# Patient Record
Sex: Male | Born: 1950 | Race: White | Hispanic: No | Smoking: Former smoker
Health system: Southern US, Community
[De-identification: ages and names within clinical notes are randomized; demographics above are authoritative.]

## PROBLEM LIST (undated history)

## (undated) DIAGNOSIS — I509 Heart failure, unspecified: Secondary | ICD-10-CM

## (undated) DIAGNOSIS — E119 Type 2 diabetes mellitus without complications: Secondary | ICD-10-CM

## (undated) DIAGNOSIS — E1165 Type 2 diabetes mellitus with hyperglycemia: Secondary | ICD-10-CM

## (undated) DIAGNOSIS — F419 Anxiety disorder, unspecified: Secondary | ICD-10-CM

## (undated) DIAGNOSIS — F329 Major depressive disorder, single episode, unspecified: Secondary | ICD-10-CM

## (undated) DIAGNOSIS — E669 Obesity, unspecified: Secondary | ICD-10-CM

## (undated) DIAGNOSIS — E785 Hyperlipidemia, unspecified: Secondary | ICD-10-CM

## (undated) DIAGNOSIS — F32A Depression, unspecified: Secondary | ICD-10-CM

## (undated) DIAGNOSIS — M869 Osteomyelitis, unspecified: Secondary | ICD-10-CM

## (undated) DIAGNOSIS — I2510 Atherosclerotic heart disease of native coronary artery without angina pectoris: Secondary | ICD-10-CM

## (undated) DIAGNOSIS — I639 Cerebral infarction, unspecified: Secondary | ICD-10-CM

## (undated) DIAGNOSIS — I5022 Chronic systolic (congestive) heart failure: Secondary | ICD-10-CM

## (undated) DIAGNOSIS — I739 Peripheral vascular disease, unspecified: Secondary | ICD-10-CM

## (undated) DIAGNOSIS — J9610 Chronic respiratory failure, unspecified whether with hypoxia or hypercapnia: Secondary | ICD-10-CM

## (undated) DIAGNOSIS — IMO0002 Reserved for concepts with insufficient information to code with codable children: Secondary | ICD-10-CM

## (undated) DIAGNOSIS — M1990 Unspecified osteoarthritis, unspecified site: Secondary | ICD-10-CM

## (undated) DIAGNOSIS — I255 Ischemic cardiomyopathy: Secondary | ICD-10-CM

## (undated) DIAGNOSIS — J449 Chronic obstructive pulmonary disease, unspecified: Secondary | ICD-10-CM

## (undated) DIAGNOSIS — I10 Essential (primary) hypertension: Secondary | ICD-10-CM

## (undated) HISTORY — DX: Osteomyelitis, unspecified: M86.9

## (undated) HISTORY — PX: EYE SURGERY: SHX253

## (undated) HISTORY — DX: Hyperlipidemia, unspecified: E78.5

## (undated) HISTORY — DX: Peripheral vascular disease, unspecified: I73.9

## (undated) HISTORY — DX: Essential (primary) hypertension: I10

## (undated) HISTORY — DX: Obesity, unspecified: E66.9

## (undated) HISTORY — PX: CHOLECYSTECTOMY: SHX55

## (undated) HISTORY — PX: JOINT REPLACEMENT: SHX530

## (undated) HISTORY — PX: CORONARY ARTERY BYPASS GRAFT: SHX141

---

## 2006-04-08 HISTORY — PX: CORONARY STENT PLACEMENT: SHX1402

## 2008-04-04 ENCOUNTER — Ambulatory Visit: Payer: Self-pay | Admitting: Cardiology

## 2010-07-26 ENCOUNTER — Inpatient Hospital Stay (HOSPITAL_COMMUNITY)
Admission: EM | Admit: 2010-07-26 | Discharge: 2010-08-03 | DRG: 189 | Disposition: A | Discharge status: Home / Self Care | Payer: Medicare PPO | Source: Other Acute Inpatient Hospital | Attending: Pulmonary Disease | Admitting: Pulmonary Disease

## 2010-07-26 ENCOUNTER — Inpatient Hospital Stay (HOSPITAL_COMMUNITY): Payer: Medicare PPO

## 2010-07-26 DIAGNOSIS — Z7982 Long term (current) use of aspirin: Secondary | ICD-10-CM

## 2010-07-26 DIAGNOSIS — R0902 Hypoxemia: Secondary | ICD-10-CM | POA: Diagnosis present

## 2010-07-26 DIAGNOSIS — J441 Chronic obstructive pulmonary disease with (acute) exacerbation: Secondary | ICD-10-CM | POA: Diagnosis present

## 2010-07-26 DIAGNOSIS — IMO0002 Reserved for concepts with insufficient information to code with codable children: Secondary | ICD-10-CM

## 2010-07-26 DIAGNOSIS — J9600 Acute respiratory failure, unspecified whether with hypoxia or hypercapnia: Secondary | ICD-10-CM

## 2010-07-26 DIAGNOSIS — J449 Chronic obstructive pulmonary disease, unspecified: Secondary | ICD-10-CM

## 2010-07-26 DIAGNOSIS — N179 Acute kidney failure, unspecified: Secondary | ICD-10-CM | POA: Diagnosis present

## 2010-07-26 DIAGNOSIS — I10 Essential (primary) hypertension: Secondary | ICD-10-CM | POA: Diagnosis present

## 2010-07-26 DIAGNOSIS — G4733 Obstructive sleep apnea (adult) (pediatric): Secondary | ICD-10-CM | POA: Diagnosis present

## 2010-07-26 DIAGNOSIS — I498 Other specified cardiac arrhythmias: Secondary | ICD-10-CM | POA: Diagnosis present

## 2010-07-26 DIAGNOSIS — E871 Hypo-osmolality and hyponatremia: Secondary | ICD-10-CM | POA: Diagnosis not present

## 2010-07-26 DIAGNOSIS — R0602 Shortness of breath: Secondary | ICD-10-CM

## 2010-07-26 DIAGNOSIS — Z79899 Other long term (current) drug therapy: Secondary | ICD-10-CM

## 2010-07-26 DIAGNOSIS — K5900 Constipation, unspecified: Secondary | ICD-10-CM | POA: Diagnosis not present

## 2010-07-26 DIAGNOSIS — I2510 Atherosclerotic heart disease of native coronary artery without angina pectoris: Secondary | ICD-10-CM | POA: Diagnosis present

## 2010-07-26 DIAGNOSIS — J9620 Acute and chronic respiratory failure, unspecified whether with hypoxia or hypercapnia: Principal | ICD-10-CM | POA: Diagnosis present

## 2010-07-26 DIAGNOSIS — J8410 Pulmonary fibrosis, unspecified: Secondary | ICD-10-CM | POA: Diagnosis present

## 2010-07-26 DIAGNOSIS — E669 Obesity, unspecified: Secondary | ICD-10-CM | POA: Diagnosis present

## 2010-07-26 DIAGNOSIS — Z794 Long term (current) use of insulin: Secondary | ICD-10-CM

## 2010-07-26 DIAGNOSIS — J189 Pneumonia, unspecified organism: Secondary | ICD-10-CM | POA: Diagnosis present

## 2010-07-26 DIAGNOSIS — Z951 Presence of aortocoronary bypass graft: Secondary | ICD-10-CM

## 2010-07-26 DIAGNOSIS — F40298 Other specified phobia: Secondary | ICD-10-CM | POA: Diagnosis present

## 2010-07-26 DIAGNOSIS — E876 Hypokalemia: Secondary | ICD-10-CM | POA: Diagnosis not present

## 2010-07-26 DIAGNOSIS — J13 Pneumonia due to Streptococcus pneumoniae: Secondary | ICD-10-CM

## 2010-07-26 DIAGNOSIS — E1169 Type 2 diabetes mellitus with other specified complication: Secondary | ICD-10-CM | POA: Diagnosis present

## 2010-07-26 LAB — GLUCOSE, CAPILLARY
Glucose-Capillary: 271 mg/dL — ABNORMAL HIGH (ref 70–99)
Glucose-Capillary: 265 mg/dL — ABNORMAL HIGH (ref 70–99)

## 2010-07-26 LAB — COMPREHENSIVE METABOLIC PANEL
ALT: 10 U/L (ref 0–53)
AST: 12 U/L (ref 0–37)
Albumin: 2.6 g/dL — ABNORMAL LOW (ref 3.5–5.2)
CO2: 27 mEq/L (ref 19–32)
Chloride: 96 mEq/L (ref 96–112)
GFR calc Af Amer: >60 mL/min (ref >60)
GFR calc non Af Amer: >60 mL/min (ref >60)
Sodium: 132 mEq/L — ABNORMAL LOW (ref 135–145)
Total Bilirubin: 0.7 mg/dL (ref 0.3–1.2)

## 2010-07-26 LAB — POCT I-STAT 3, ART BLOOD GAS (G3+)
Bicarbonate: 26.8 mEq/L — ABNORMAL HIGH (ref 20.0–24.0)
pCO2 arterial: 42.8 mmHg (ref 35.0–45.0)
pH, Arterial: 7.404 (ref 7.350–7.450)
pO2, Arterial: 94 mmHg (ref 80.0–100.0)

## 2010-07-26 LAB — SODIUM, URINE, RANDOM: Sodium, Ur: 92 mEq/L

## 2010-07-26 LAB — TSH: TSH: 1.13 u[IU]/mL (ref 0.350–4.500)

## 2010-07-26 LAB — CBC
HCT: 31.2 % — ABNORMAL LOW (ref 39.0–52.0)
Hemoglobin: 10.3 g/dL — ABNORMAL LOW (ref 13.0–17.0)
RBC: 3.63 MIL/uL — ABNORMAL LOW (ref 4.22–5.81)
WBC: 11.5 10*3/uL — ABNORMAL HIGH (ref 4.0–10.5)

## 2010-07-26 LAB — MRSA PCR SCREENING: MRSA by PCR: NEGATIVE

## 2010-07-27 ENCOUNTER — Inpatient Hospital Stay (HOSPITAL_COMMUNITY): Payer: Medicare PPO

## 2010-07-27 DIAGNOSIS — I517 Cardiomegaly: Secondary | ICD-10-CM

## 2010-07-27 LAB — GLUCOSE, CAPILLARY
Glucose-Capillary: 232 mg/dL — ABNORMAL HIGH (ref 70–99)
Glucose-Capillary: 290 mg/dL — ABNORMAL HIGH (ref 70–99)
Glucose-Capillary: 333 mg/dL — ABNORMAL HIGH (ref 70–99)
Glucose-Capillary: 373 mg/dL — ABNORMAL HIGH (ref 70–99)

## 2010-07-27 LAB — PROCALCITONIN: Procalcitonin: <0.1 ng/mL

## 2010-07-27 LAB — BASIC METABOLIC PANEL
BUN: 12 mg/dL (ref 6–23)
CO2: 25 mEq/L (ref 19–32)
Chloride: 97 mEq/L (ref 96–112)
GFR calc non Af Amer: >60 mL/min (ref >60)
Glucose, Bld: 319 mg/dL — ABNORMAL HIGH (ref 70–99)
Potassium: 3.8 mEq/L (ref 3.5–5.1)
Sodium: 133 mEq/L — ABNORMAL LOW (ref 135–145)

## 2010-07-27 LAB — CBC
HCT: 33.2 % — ABNORMAL LOW (ref 39.0–52.0)
Hemoglobin: 11.1 g/dL — ABNORMAL LOW (ref 13.0–17.0)
MCV: 85.8 fL (ref 78.0–100.0)
RDW: 14.1 % (ref 11.5–15.5)
WBC: 10.5 10*3/uL (ref 4.0–10.5)

## 2010-07-27 LAB — URINALYSIS, ROUTINE W REFLEX MICROSCOPIC
Bilirubin Urine: NEGATIVE
Protein, ur: 30 mg/dL — AB
Urobilinogen, UA: 0.2 mg/dL (ref 0.0–1.0)

## 2010-07-27 LAB — URINE MICROSCOPIC-ADD ON

## 2010-07-27 LAB — C3 COMPLEMENT: C3 Complement: 185 mg/dL (ref 88–201)

## 2010-07-27 LAB — CARDIAC PANEL(CRET KIN+CKTOT+MB+TROPI): Troponin I: 0.03 ng/mL (ref 0.00–0.06)

## 2010-07-28 ENCOUNTER — Inpatient Hospital Stay (HOSPITAL_COMMUNITY): Payer: Medicare PPO

## 2010-07-28 DIAGNOSIS — J8410 Pulmonary fibrosis, unspecified: Secondary | ICD-10-CM

## 2010-07-28 LAB — RHEUMATOID FACTOR: Rhuematoid fact SerPl-aCnc: 17 IU/mL — ABNORMAL HIGH (ref <=14)

## 2010-07-28 LAB — BASIC METABOLIC PANEL
Calcium: 9 mg/dL (ref 8.4–10.5)
Creatinine, Ser: 1.14 mg/dL (ref 0.4–1.5)
GFR calc Af Amer: >60 mL/min (ref >60)
GFR calc non Af Amer: >60 mL/min (ref >60)

## 2010-07-28 LAB — URINE CULTURE: Colony Count: NO GROWTH

## 2010-07-28 LAB — COMPLEMENT, TOTAL: Compl, Total (CH50): >60 U/mL — ABNORMAL HIGH (ref 31–60)

## 2010-07-28 LAB — MAGNESIUM: Magnesium: 1.6 mg/dL (ref 1.5–2.5)

## 2010-07-28 LAB — CBC
MCH: 28.3 pg (ref 26.0–34.0)
MCHC: 33.2 g/dL (ref 30.0–36.0)
Platelets: 362 10*3/uL (ref 150–400)

## 2010-07-28 LAB — PHOSPHORUS: Phosphorus: 3.1 mg/dL (ref 2.3–4.6)

## 2010-07-28 LAB — GLUCOSE, CAPILLARY: Glucose-Capillary: 342 mg/dL — ABNORMAL HIGH (ref 70–99)

## 2010-07-29 ENCOUNTER — Inpatient Hospital Stay (HOSPITAL_COMMUNITY): Payer: Medicare PPO

## 2010-07-29 DIAGNOSIS — J13 Pneumonia due to Streptococcus pneumoniae: Secondary | ICD-10-CM

## 2010-07-29 DIAGNOSIS — J9600 Acute respiratory failure, unspecified whether with hypoxia or hypercapnia: Secondary | ICD-10-CM

## 2010-07-29 DIAGNOSIS — J8410 Pulmonary fibrosis, unspecified: Secondary | ICD-10-CM

## 2010-07-29 LAB — GLUCOSE, CAPILLARY: Glucose-Capillary: 232 mg/dL — ABNORMAL HIGH (ref 70–99)

## 2010-07-29 LAB — BASIC METABOLIC PANEL
BUN: 27 mg/dL — ABNORMAL HIGH (ref 6–23)
Calcium: 8.8 mg/dL (ref 8.4–10.5)
Creatinine, Ser: 1.17 mg/dL (ref 0.4–1.5)
GFR calc non Af Amer: >60 mL/min (ref >60)
Glucose, Bld: 252 mg/dL — ABNORMAL HIGH (ref 70–99)
Potassium: 4 mEq/L (ref 3.5–5.1)

## 2010-07-29 LAB — CBC
HCT: 32.1 % — ABNORMAL LOW (ref 39.0–52.0)
MCH: 28.2 pg (ref 26.0–34.0)
MCHC: 33 g/dL (ref 30.0–36.0)
MCV: 85.4 fL (ref 78.0–100.0)
Platelets: 399 10*3/uL (ref 150–400)
RDW: 13.9 % (ref 11.5–15.5)
WBC: 15.5 10*3/uL — ABNORMAL HIGH (ref 4.0–10.5)

## 2010-07-29 LAB — MAGNESIUM: Magnesium: 1.8 mg/dL (ref 1.5–2.5)

## 2010-07-30 ENCOUNTER — Inpatient Hospital Stay (HOSPITAL_COMMUNITY): Payer: Medicare PPO

## 2010-07-30 DIAGNOSIS — J449 Chronic obstructive pulmonary disease, unspecified: Secondary | ICD-10-CM

## 2010-07-30 LAB — SJOGRENS SYNDROME-B EXTRACTABLE NUCLEAR ANTIBODY: SSB (La) (ENA) Antibody, IgG: <1 AU/mL (ref <30)

## 2010-07-30 LAB — CYCLIC CITRUL PEPTIDE ANTIBODY, IGG: Cyclic Citrullin Peptide Ab: <2 U/mL (ref 0.0–5.0)

## 2010-07-30 LAB — GLUCOSE, CAPILLARY
Glucose-Capillary: 249 mg/dL — ABNORMAL HIGH (ref 70–99)
Glucose-Capillary: 269 mg/dL — ABNORMAL HIGH (ref 70–99)
Glucose-Capillary: 284 mg/dL — ABNORMAL HIGH (ref 70–99)
Glucose-Capillary: 288 mg/dL — ABNORMAL HIGH (ref 70–99)

## 2010-07-30 LAB — CBC
MCH: 28.6 pg (ref 26.0–34.0)
MCHC: 33.7 g/dL (ref 30.0–36.0)
MCV: 84.9 fL (ref 78.0–100.0)
Platelets: 409 10*3/uL — ABNORMAL HIGH (ref 150–400)
RBC: 3.91 MIL/uL — ABNORMAL LOW (ref 4.22–5.81)

## 2010-07-30 LAB — BASIC METABOLIC PANEL
BUN: 29 mg/dL — ABNORMAL HIGH (ref 6–23)
CO2: 30 mEq/L (ref 19–32)
Calcium: 9 mg/dL (ref 8.4–10.5)
Chloride: 96 mEq/L (ref 96–112)
Creatinine, Ser: 1.46 mg/dL (ref 0.4–1.5)
GFR calc Af Amer: 59 mL/min — ABNORMAL LOW (ref >60)

## 2010-07-30 LAB — PHOSPHORUS: Phosphorus: 3.7 mg/dL (ref 2.3–4.6)

## 2010-07-30 LAB — MAGNESIUM: Magnesium: 2.3 mg/dL (ref 1.5–2.5)

## 2010-07-30 LAB — ANTI-NEUTROPHIL ANTIBODY

## 2010-07-31 ENCOUNTER — Inpatient Hospital Stay (HOSPITAL_COMMUNITY): Payer: Medicare PPO

## 2010-07-31 LAB — GLUCOSE, CAPILLARY: Glucose-Capillary: 360 mg/dL — ABNORMAL HIGH (ref 70–99)

## 2010-07-31 LAB — BASIC METABOLIC PANEL
GFR calc non Af Amer: 52 mL/min — ABNORMAL LOW (ref >60)
Glucose, Bld: 291 mg/dL — ABNORMAL HIGH (ref 70–99)
Potassium: 4.1 mEq/L (ref 3.5–5.1)
Sodium: 138 mEq/L (ref 135–145)

## 2010-07-31 LAB — BRAIN NATRIURETIC PEPTIDE: Pro B Natriuretic peptide (BNP): 189 pg/mL — ABNORMAL HIGH (ref 0.0–100.0)

## 2010-07-31 LAB — PROCALCITONIN: Procalcitonin: <0.1 ng/mL

## 2010-07-31 LAB — MAGNESIUM: Magnesium: 2 mg/dL (ref 1.5–2.5)

## 2010-08-01 ENCOUNTER — Inpatient Hospital Stay (HOSPITAL_COMMUNITY): Payer: Medicare PPO

## 2010-08-01 LAB — CBC
Hemoglobin: 11.6 g/dL — ABNORMAL LOW (ref 13.0–17.0)
MCH: 27.5 pg (ref 26.0–34.0)
MCV: 85.3 fL (ref 78.0–100.0)
RBC: 4.22 MIL/uL (ref 4.22–5.81)

## 2010-08-01 LAB — BASIC METABOLIC PANEL
BUN: 36 mg/dL — ABNORMAL HIGH (ref 6–23)
CO2: 31 mEq/L (ref 19–32)
Chloride: 95 mEq/L — ABNORMAL LOW (ref 96–112)
Creatinine, Ser: 1.52 mg/dL — ABNORMAL HIGH (ref 0.4–1.5)

## 2010-08-01 LAB — GLUCOSE, CAPILLARY
Glucose-Capillary: 94 mg/dL (ref 70–99)
Glucose-Capillary: 137 mg/dL — ABNORMAL HIGH (ref 70–99)
Glucose-Capillary: 149 mg/dL — ABNORMAL HIGH (ref 70–99)

## 2010-08-02 LAB — BASIC METABOLIC PANEL
Calcium: 9.7 mg/dL (ref 8.4–10.5)
GFR calc Af Amer: 59 mL/min — ABNORMAL LOW (ref >60)
GFR calc non Af Amer: 49 mL/min — ABNORMAL LOW (ref >60)
Glucose, Bld: 63 mg/dL — ABNORMAL LOW (ref 70–99)
Potassium: 3.6 mEq/L (ref 3.5–5.1)
Sodium: 144 mEq/L (ref 135–145)

## 2010-08-02 LAB — CULTURE, BLOOD (ROUTINE X 2)
Culture  Setup Time: 201204201629
Culture: NO GROWTH

## 2010-08-02 LAB — GLUCOSE, CAPILLARY
Glucose-Capillary: 61 mg/dL — ABNORMAL LOW (ref 70–99)
Glucose-Capillary: 91 mg/dL (ref 70–99)
Glucose-Capillary: 165 mg/dL — ABNORMAL HIGH (ref 70–99)
Glucose-Capillary: 188 mg/dL — ABNORMAL HIGH (ref 70–99)

## 2010-08-02 LAB — PHOSPHORUS: Phosphorus: 5.4 mg/dL — ABNORMAL HIGH (ref 2.3–4.6)

## 2010-08-02 LAB — MAGNESIUM: Magnesium: 1.9 mg/dL (ref 1.5–2.5)

## 2010-08-03 LAB — BASIC METABOLIC PANEL
Chloride: 98 mEq/L (ref 96–112)
GFR calc non Af Amer: 49 mL/min — ABNORMAL LOW (ref >60)
Glucose, Bld: 142 mg/dL — ABNORMAL HIGH (ref 70–99)
Potassium: 3.8 mEq/L (ref 3.5–5.1)
Sodium: 140 mEq/L (ref 135–145)

## 2010-08-03 LAB — GLUCOSE, CAPILLARY
Glucose-Capillary: 79 mg/dL (ref 70–99)
Glucose-Capillary: 125 mg/dL — ABNORMAL HIGH (ref 70–99)

## 2010-08-03 LAB — CBC
HCT: 38.4 % — ABNORMAL LOW (ref 39.0–52.0)
MCV: 87.7 fL (ref 78.0–100.0)
Platelets: 494 10*3/uL — ABNORMAL HIGH (ref 150–400)
RBC: 4.38 MIL/uL (ref 4.22–5.81)
RDW: 14.9 % (ref 11.5–15.5)
WBC: 14 10*3/uL — ABNORMAL HIGH (ref 4.0–10.5)

## 2010-08-10 NOTE — Discharge Summary (Addendum)
NAME:  Timothy Trujillo, Timothy Trujillo NO.:  [id number]  MEDICAL RECORD NO.:  [id number]           PATIENT TYPE:  LOCATION:                                 FACILITY:  PHYSICIAN:  Timothy Evener, MD  DATE OF BIRTH:  Jul 29, 1950  DATE OF ADMISSION: DATE OF DISCHARGE:                              DISCHARGE SUMMARY   DISCHARGE DIAGNOSES: 1. Acute-on-chronic respiratory failure secondary to interstitial lung     disease, acute exacerbation of chronic obstructive pulmonary     disease with underlying obstructive sleep apnea and associated     hypoxemia. 2. Acute renal failure. 3. Hyperglycemia/diabetes mellitus. 4. History of hypertension. 5. Constipation.  LINES/TUBES:  Two peripheral lines which were started on July 26, 2010.  MICRO DATA: 1. On July 27, 2010, urine culture demonstrates no growth. 2. On July 27, 2010, blood cultures x2 demonstrate no growth. 3. Procalcitonin on July 27, 2010, is less than 0.1. 4. On July 26, 2010, HIV is negative. 5. On July 26, 2010, MRSA PCR is negative.  LABORATORY DATA: 1. On July 26, 2010, ESR is 103. 2. On July 26, 2010, TSH is 1.13. 3. On July 26, 2010, complement C3 is 185, complement C4 is 31 4. On July 26, 2010, C-reactive protein is 227. 5. On July 26, 2010, ANA which is negative. 6. On July 26, 2010, anti-DNA which is 2. 7. On July 28, 2010, C-reactive protein at 14.7. 8. Complement CH50 which is greater than 60. 9. On July 28, 2010, rheumatoid factor which is 17. 10.On July 28, 2010, citrullinated antibody IgG which is less than 2. 11.Sjogren syndrome SSA antibody IgG is less than 1, SSB antibody IgG     is less than 1.  ANTIBIOTIC DATA: 1. The patient was placed on vancomycin from July 26, 2010, to July 29, 2010. 2. Ceftazidime from July 26, 2010, to July 30, 2010. 3. Azithromycin from July 26, 2010, to July 30, 2010.  KEY EVENTS/STUDIES: 1. On July 25, 2010, chest x-ray demonstrates  worsening right greater     than left airspace disease with questionable multifocal pneumonia     from [facility]. 2. On July 26, 2010, echocardiogram demonstrates four-vessel     visualization of LV and RV, unable to evaluate function, PA     pressure of 42 mmHg.  HISTORY OF PRESENT ILLNESS:  [name] is a 60 year old male with past medical history of hypertension, COPD, coronary artery bypass grafting, diabetes mellitus type 2 with recent pneumonia and was treated on March he had with a recent pneumonia.  He was admitted from July 07, 2010, at [facility] and treated with Levaquin and subsequently discharged on July 16, 2010, and completed antibiotics on July 21, 2010.  He was readmitted to [facility] on July 25, 2010, for shortness of breath for 3 days prior to presentation and found to have worsening right greater than left pneumonia on chest x-ray.  At that time, he was noted to have oxygen saturations in the 90s on room air and sinus tachycardia in the 120s.  He was transferred to  [facility] for further evaluation and treatment.  Upon admission, he was placed on BiPAP therapy with cycling 4 hours on and 2 hours off.  High-dose steroids and aggressive nebulization were instituted.  Autoimmune panel was sent for evaluation.  Upon admission, he did indicate that he would not want ACLS shock, CPR, or intubation for cardiac arrest.  He would only want intubation for progressive respiratory failure.  He was treated with Lasix to achieve a negative balance.  He was placed on azithromycin, vancomycin, and ceftazidime.  HIV antibody was tested and negative.  [name] does have a known history of diabetes mellitus and was placed on sliding scale insulin.  He also has a known history of hypertension and is on a significant home regimen which was held upon admission.  A 2-D echocardiogram was evaluated on July 27, 2010, which demonstrated findings as above.  He  did remain on cyclic BiPAP therapy until July 29, 2010, at which time he was transitioned to nocturnal BiPAP only, and also was transferred to step-down on July 29, 2010.  Antibiotics were narrowed as the patient improved and culture data returned.  [name] does have bilateral infiltrates of unclear etiology and felt that pneumonia was unlikely secondary to a negative procalcitonin.  It was felt that it was likely related to edema versus acute lung injury after a recent pneumonia versus autoimmune process.  TTE was completed during hospitalization and was a poor study on July 27, 2010.  BNP was only mildly elevated and infiltrates were improved without significant diuresis.  [name] did not require intubation during hospitalization. If required intubation, we would recommend fiberoptic bronchoscopy for possible VATS if suspicion continues for inflammatory process.  He was continued during hospitalization on diuresis as renal function permitted.  He did have a mild increase in his serum creatinine and Lasix was decreased with subsequent decrease in serum creatinine.  At time of dictation, it was felt that [name] does need a sleep study and follow up with outpatient Pulmonary/Sleep Medicine.  HOSPITAL COURSE BY DISCHARGE DIAGNOSES: 1. Acute-on-chronic respiratory failure secondary to interstitial lung     disease, acute exacerbation of COPD with underlying obstructive     sleep apnea and associated hypoxemia.  As per HPI, [name] was     admitted to [facility] on July 26, 2010, as a transfer from     [facility].  He recently had been admitted to [facility] from July 07, 2010, until July 16, 2010, with treatment     for pneumonia with Levaquin.  He completed Levaquin on July 20, 2012.  Unfortunately, he did require readmission to [facility] on     July 25, 2010, for 3 days of shortness of breath and was noted to     have worsening infiltrates on chest  x-ray with oxygen saturations     of 90% on room air.  He was transferred to [facility] on July 26, 2010, for further evaluation and workup.  He was admitted to the     intensive care unit and did require approximately 48 hours of     cyclic BiPAP therapy.  He was placed on high-dose steroids and     diuresed.  He was placed on IV antibiotics.  However, at this time     it is felt that there was no evidence for acute infection and more     likely infiltrates  or edema.  However, he did not significantly     respond to diuresis.  Other consideration was acute lung injury.     Procalcitonin was negative during hospital course, and antibiotic     therapy was narrowed and stopped as cultures returned.  Autoimmune     panel was sent for evaluation.  Please see above for details.  If     further concerns were inflammatory process, could consider     fiberoptic bronchoscopy/VATS in the future.  At time of discharge,     Mr. Andreoni has been set up for an outpatient sleep study with     followup with sleep/pulmonary MD.  Please see below for details.     It is recommended at time of discharge that he continue on at least     nocturnal CPAP until followup with Dr. Craige Cotta in the outpatient     setting. 2. Acute renal failure.  Mr. Vickerman was aggressively diuresed in the     setting of acute bilateral pulmonary infiltrates.  He did not have     a significant response to diuresis during hospital course.  Lasix     therapy was scaled back in the setting of rising serum creatinine.     Prior to admission, he was taking Lasix as needed secondary to     swelling.  At time of discharge, he was on 40 mg daily and will be     discharged on previous home dose of 1-2 tablets of 20 mg Lasix as     needed. 3. Hyperglycemia, secondary to steroid use in the setting of diabetes     mellitus.  During hospital course, Mr. Toon was placed on sliding     scale insulin to achieve normal glucose control.  He will  be     discharged on prednisone therapy with a taper.  As prednisone is     tapered, it is expected that his blood sugars should normalize.  It     is recommended that he have a followup BMP with his primary care     provider as below. 4. Constipation.  Mr. Mcphearson was given Senokot-S during hospital     course for constipation.  No further followup needed at this time. 5. History of hypertension.  Initially, during hospital admission, Mr.     Pianka home medications were held.  Currently, prior to admission     he was on metoprolol 200 mg XL one tablet twice daily as well as     Imdur 30 mg XR one tablet daily and amlodipine 5 mg 1 tablet daily.     He also was on dipyridamole 75 mg 1 tablet 3 times daily.  At time     of dictation, he had not been resumed on any home medications for     hypertension and blood pressures were ranging within 110s-130s     systolic.  He will be resumed at time of dictation on amlodipine     and Lopressor.  These will be resumed at significantly reduced dose     from prior home medication dosing.  This will need to be followed     up on with his primary care provider.  DISCHARGE INSTRUCTIONS: 1. Activity:  Mr. Olivencia is instructed to increase activity slowly and     as tolerated. 2. Diet:  Diabetic diet. 3. Followup:  He is scheduled to follow up with Dr. Craige Cotta on Sep 04, 2010,  at 10 a.m. and he is scheduled to have sleep study on the     evening of Aug 26, 2010.  Pulmonary office will mail to Mr. Sculley     further instructions regarding sleep study.  He also will be     scheduled to follow up with his primary care provider, Dr. Daryel November.  DISCHARGE MEDICATIONS:  To be dictated at time of discharge.  DISPOSITION AT TIME OF DISCHARGE:  Mr. Hollenbeck has met maximum benefit of inpatient therapy is currently medically stable and cleared for discharge pending review in the a.m. of August 03, 2010.  TIME SPENT ON DISPOSITION:  Greater than 45  minutes.     Canary Brim, NP   ______________________________ Timothy Evener, MD    BO/MEDQ  D:  08/02/2010  T:  08/03/2010  Job:  [id number]  cc:   Daryel November, MD Coralyn Helling, MD  Electronically Signed by Canary Brim  on 08/10/2010 02:21:45 PM Electronically Signed by Koren Bound MD on 08/27/2010 02:55:52 PM

## 2010-08-15 ENCOUNTER — Telehealth: Payer: Self-pay | Admitting: Pulmonary Disease

## 2010-08-15 NOTE — Telephone Encounter (Signed)
[id number] x1 

## 2010-08-16 NOTE — Telephone Encounter (Signed)
Noted  

## 2010-08-16 NOTE — Telephone Encounter (Signed)
Spoke with pt's daughter-in-law and she states that pt has been admitted to [facility] with PNA. Will forward msg to VS so he is aware.

## 2010-08-20 DIAGNOSIS — I2510 Atherosclerotic heart disease of native coronary artery without angina pectoris: Secondary | ICD-10-CM

## 2010-08-21 ENCOUNTER — Inpatient Hospital Stay (HOSPITAL_COMMUNITY)
Admission: EM | Admit: 2010-08-21 | Discharge: 2010-09-13 | DRG: 166 | Disposition: A | Discharge status: Skilled Nursing Facility | Payer: Medicare PPO | Source: Other Acute Inpatient Hospital | Attending: Emergency Medicine | Admitting: Emergency Medicine

## 2010-08-21 ENCOUNTER — Inpatient Hospital Stay (HOSPITAL_COMMUNITY): Payer: Medicare PPO

## 2010-08-21 DIAGNOSIS — I2510 Atherosclerotic heart disease of native coronary artery without angina pectoris: Secondary | ICD-10-CM | POA: Diagnosis present

## 2010-08-21 DIAGNOSIS — Z87891 Personal history of nicotine dependence: Secondary | ICD-10-CM

## 2010-08-21 DIAGNOSIS — Z8673 Personal history of transient ischemic attack (TIA), and cerebral infarction without residual deficits: Secondary | ICD-10-CM

## 2010-08-21 DIAGNOSIS — Z7982 Long term (current) use of aspirin: Secondary | ICD-10-CM

## 2010-08-21 DIAGNOSIS — E119 Type 2 diabetes mellitus without complications: Secondary | ICD-10-CM | POA: Diagnosis present

## 2010-08-21 DIAGNOSIS — J4489 Other specified chronic obstructive pulmonary disease: Secondary | ICD-10-CM | POA: Diagnosis present

## 2010-08-21 DIAGNOSIS — J449 Chronic obstructive pulmonary disease, unspecified: Secondary | ICD-10-CM | POA: Diagnosis present

## 2010-08-21 DIAGNOSIS — G4733 Obstructive sleep apnea (adult) (pediatric): Secondary | ICD-10-CM | POA: Diagnosis present

## 2010-08-21 DIAGNOSIS — I509 Heart failure, unspecified: Secondary | ICD-10-CM | POA: Diagnosis present

## 2010-08-21 DIAGNOSIS — K5909 Other constipation: Secondary | ICD-10-CM | POA: Diagnosis present

## 2010-08-21 DIAGNOSIS — J189 Pneumonia, unspecified organism: Secondary | ICD-10-CM | POA: Diagnosis present

## 2010-08-21 DIAGNOSIS — R5381 Other malaise: Secondary | ICD-10-CM | POA: Diagnosis not present

## 2010-08-21 DIAGNOSIS — E785 Hyperlipidemia, unspecified: Secondary | ICD-10-CM | POA: Diagnosis present

## 2010-08-21 DIAGNOSIS — Z79899 Other long term (current) drug therapy: Secondary | ICD-10-CM

## 2010-08-21 DIAGNOSIS — IMO0002 Reserved for concepts with insufficient information to code with codable children: Secondary | ICD-10-CM

## 2010-08-21 DIAGNOSIS — E876 Hypokalemia: Secondary | ICD-10-CM | POA: Diagnosis present

## 2010-08-21 DIAGNOSIS — M109 Gout, unspecified: Secondary | ICD-10-CM | POA: Diagnosis not present

## 2010-08-21 DIAGNOSIS — Z794 Long term (current) use of insulin: Secondary | ICD-10-CM

## 2010-08-21 DIAGNOSIS — J8410 Pulmonary fibrosis, unspecified: Principal | ICD-10-CM | POA: Diagnosis present

## 2010-08-21 DIAGNOSIS — Z951 Presence of aortocoronary bypass graft: Secondary | ICD-10-CM

## 2010-08-21 DIAGNOSIS — J9620 Acute and chronic respiratory failure, unspecified whether with hypoxia or hypercapnia: Secondary | ICD-10-CM | POA: Diagnosis present

## 2010-08-21 DIAGNOSIS — I10 Essential (primary) hypertension: Secondary | ICD-10-CM | POA: Diagnosis present

## 2010-08-21 LAB — GLUCOSE, CAPILLARY: Glucose-Capillary: 192 mg/dL — ABNORMAL HIGH (ref 70–99)

## 2010-08-21 LAB — BASIC METABOLIC PANEL
BUN: 31 mg/dL — ABNORMAL HIGH (ref 6–23)
CO2: 35 mEq/L — ABNORMAL HIGH (ref 19–32)
Chloride: 98 mEq/L (ref 96–112)
GFR calc Af Amer: >60 mL/min (ref >60)
Potassium: 3.4 mEq/L — ABNORMAL LOW (ref 3.5–5.1)

## 2010-08-21 LAB — PHOSPHORUS: Phosphorus: 3.3 mg/dL (ref 2.3–4.6)

## 2010-08-21 LAB — MAGNESIUM: Magnesium: 1.8 mg/dL (ref 1.5–2.5)

## 2010-08-21 LAB — DIFFERENTIAL
Basophils Absolute: 0 10*3/uL (ref 0.0–0.1)
Basophils Relative: 0 % (ref 0–1)
Lymphocytes Relative: 3 % — ABNORMAL LOW (ref 12–46)
Neutro Abs: 7.9 10*3/uL — ABNORMAL HIGH (ref 1.7–7.7)
Neutrophils Relative %: 92 % — ABNORMAL HIGH (ref 43–77)

## 2010-08-21 LAB — CBC
HCT: 35.1 % — ABNORMAL LOW (ref 39.0–52.0)
Hemoglobin: 11.4 g/dL — ABNORMAL LOW (ref 13.0–17.0)
RBC: 4.09 MIL/uL — ABNORMAL LOW (ref 4.22–5.81)

## 2010-08-22 ENCOUNTER — Inpatient Hospital Stay (HOSPITAL_COMMUNITY): Payer: Medicare PPO

## 2010-08-22 DIAGNOSIS — R0609 Other forms of dyspnea: Secondary | ICD-10-CM

## 2010-08-22 DIAGNOSIS — J9600 Acute respiratory failure, unspecified whether with hypoxia or hypercapnia: Secondary | ICD-10-CM

## 2010-08-22 DIAGNOSIS — J189 Pneumonia, unspecified organism: Secondary | ICD-10-CM

## 2010-08-22 DIAGNOSIS — I509 Heart failure, unspecified: Secondary | ICD-10-CM

## 2010-08-22 DIAGNOSIS — R0989 Other specified symptoms and signs involving the circulatory and respiratory systems: Secondary | ICD-10-CM

## 2010-08-22 LAB — CBC
HCT: 35.5 % — ABNORMAL LOW (ref 39.0–52.0)
Hemoglobin: 11.6 g/dL — ABNORMAL LOW (ref 13.0–17.0)
MCH: 28 pg (ref 26.0–34.0)
MCHC: 32.7 g/dL (ref 30.0–36.0)
RBC: 4.15 MIL/uL — ABNORMAL LOW (ref 4.22–5.81)

## 2010-08-22 LAB — DIFFERENTIAL
Basophils Relative: 0 % (ref 0–1)
Lymphocytes Relative: 3 % — ABNORMAL LOW (ref 12–46)
Monocytes Absolute: 0.3 10*3/uL (ref 0.1–1.0)
Monocytes Relative: 3 % (ref 3–12)
Neutro Abs: 7.8 10*3/uL — ABNORMAL HIGH (ref 1.7–7.7)
Neutrophils Relative %: 93 % — ABNORMAL HIGH (ref 43–77)

## 2010-08-22 LAB — POCT I-STAT 3, ART BLOOD GAS (G3+)
Bicarbonate: 38 mEq/L — ABNORMAL HIGH (ref 20.0–24.0)
TCO2: 39 mmol/L (ref 0–100)
pCO2 arterial: 47.6 mmHg — ABNORMAL HIGH (ref 35.0–45.0)
pH, Arterial: 7.502 — ABNORMAL HIGH (ref 7.350–7.450)

## 2010-08-22 LAB — URINE CULTURE: Culture  Setup Time: 201205160054

## 2010-08-22 LAB — GLUCOSE, CAPILLARY
Glucose-Capillary: 191 mg/dL — ABNORMAL HIGH (ref 70–99)
Glucose-Capillary: 181 mg/dL — ABNORMAL HIGH (ref 70–99)
Glucose-Capillary: 201 mg/dL — ABNORMAL HIGH (ref 70–99)

## 2010-08-22 LAB — URINALYSIS, ROUTINE W REFLEX MICROSCOPIC
Nitrite: NEGATIVE
Specific Gravity, Urine: 1.01 (ref 1.005–1.030)
Urobilinogen, UA: 0.2 mg/dL (ref 0.0–1.0)

## 2010-08-22 LAB — PRO B NATRIURETIC PEPTIDE: Pro B Natriuretic peptide (BNP): 2253 pg/mL — ABNORMAL HIGH (ref 0–125)

## 2010-08-22 LAB — LEGIONELLA ANTIGEN, URINE

## 2010-08-22 LAB — MAGNESIUM: Magnesium: 1.8 mg/dL (ref 1.5–2.5)

## 2010-08-22 LAB — BASIC METABOLIC PANEL
CO2: 37 mEq/L — ABNORMAL HIGH (ref 19–32)
Chloride: 97 mEq/L (ref 96–112)
GFR calc Af Amer: >60 mL/min (ref >60)
Potassium: 3.3 mEq/L — ABNORMAL LOW (ref 3.5–5.1)

## 2010-08-22 LAB — RENAL FUNCTION PANEL
CO2: 35 mEq/L — ABNORMAL HIGH (ref 19–32)
Glucose, Bld: 193 mg/dL — ABNORMAL HIGH (ref 70–99)
Potassium: 3.6 mEq/L (ref 3.5–5.1)
Sodium: 143 mEq/L (ref 135–145)

## 2010-08-22 LAB — CARDIAC PANEL(CRET KIN+CKTOT+MB+TROPI): Relative Index: INVALID (ref 0.0–2.5)

## 2010-08-22 LAB — PHOSPHORUS: Phosphorus: 3.6 mg/dL (ref 2.3–4.6)

## 2010-08-22 LAB — STREP PNEUMONIAE URINARY ANTIGEN: Strep Pneumo Urinary Antigen: NEGATIVE

## 2010-08-22 LAB — C-REACTIVE PROTEIN: CRP: 1.5 mg/dL — ABNORMAL HIGH (ref <0.6)

## 2010-08-23 ENCOUNTER — Inpatient Hospital Stay (HOSPITAL_COMMUNITY): Payer: Medicare PPO

## 2010-08-23 DIAGNOSIS — J8410 Pulmonary fibrosis, unspecified: Secondary | ICD-10-CM

## 2010-08-23 LAB — ANTI-NEUTROPHIL ANTIBODY

## 2010-08-23 LAB — GLUCOSE, CAPILLARY
Glucose-Capillary: 344 mg/dL — ABNORMAL HIGH (ref 70–99)
Glucose-Capillary: 143 mg/dL — ABNORMAL HIGH (ref 70–99)
Glucose-Capillary: 198 mg/dL — ABNORMAL HIGH (ref 70–99)
Glucose-Capillary: 267 mg/dL — ABNORMAL HIGH (ref 70–99)
Glucose-Capillary: 338 mg/dL — ABNORMAL HIGH (ref 70–99)
Glucose-Capillary: 371 mg/dL — ABNORMAL HIGH (ref 70–99)

## 2010-08-23 LAB — BASIC METABOLIC PANEL
BUN: 36 mg/dL — ABNORMAL HIGH (ref 6–23)
GFR calc non Af Amer: >60 mL/min (ref >60)
Potassium: 3.8 mEq/L (ref 3.5–5.1)
Sodium: 140 mEq/L (ref 135–145)

## 2010-08-23 LAB — CBC
Platelets: 359 10*3/uL (ref 150–400)
RBC: 4.54 MIL/uL (ref 4.22–5.81)
WBC: 11 10*3/uL — ABNORMAL HIGH (ref 4.0–10.5)

## 2010-08-23 LAB — C3 COMPLEMENT: C3 Complement: 161 mg/dL (ref 88–201)

## 2010-08-23 LAB — MAGNESIUM: Magnesium: 2.1 mg/dL (ref 1.5–2.5)

## 2010-08-24 ENCOUNTER — Institutional Professional Consult (permissible substitution): Payer: Medicare PPO | Admitting: Pulmonary Disease

## 2010-08-24 ENCOUNTER — Inpatient Hospital Stay (HOSPITAL_COMMUNITY): Payer: Medicare PPO

## 2010-08-24 LAB — GLUCOSE, CAPILLARY
Glucose-Capillary: 205 mg/dL — ABNORMAL HIGH (ref 70–99)
Glucose-Capillary: 179 mg/dL — ABNORMAL HIGH (ref 70–99)
Glucose-Capillary: 64 mg/dL — ABNORMAL LOW (ref 70–99)
Glucose-Capillary: 98 mg/dL (ref 70–99)

## 2010-08-24 LAB — PROTIME-INR
INR: 1.05 (ref 0.00–1.49)
Prothrombin Time: 13.9 seconds (ref 11.6–15.2)

## 2010-08-24 LAB — BASIC METABOLIC PANEL
BUN: 36 mg/dL — ABNORMAL HIGH (ref 6–23)
Calcium: 9.6 mg/dL (ref 8.4–10.5)
GFR calc non Af Amer: >60 mL/min (ref >60)
Glucose, Bld: 182 mg/dL — ABNORMAL HIGH (ref 70–99)

## 2010-08-24 LAB — CBC
MCHC: 33.5 g/dL (ref 30.0–36.0)
Platelets: 324 10*3/uL (ref 150–400)
RDW: 15.7 % — ABNORMAL HIGH (ref 11.5–15.5)

## 2010-08-25 ENCOUNTER — Inpatient Hospital Stay (HOSPITAL_COMMUNITY): Payer: Medicare PPO

## 2010-08-25 DIAGNOSIS — J8410 Pulmonary fibrosis, unspecified: Secondary | ICD-10-CM

## 2010-08-25 LAB — BASIC METABOLIC PANEL
CO2: 30 mEq/L (ref 19–32)
Calcium: 9 mg/dL (ref 8.4–10.5)
Creatinine, Ser: 1.39 mg/dL (ref 0.4–1.5)
GFR calc Af Amer: >60 mL/min (ref >60)
GFR calc non Af Amer: 52 mL/min — ABNORMAL LOW (ref >60)
Sodium: 134 mEq/L — ABNORMAL LOW (ref 135–145)

## 2010-08-25 LAB — CBC
Hemoglobin: 11.7 g/dL — ABNORMAL LOW (ref 13.0–17.0)
MCHC: 32.9 g/dL (ref 30.0–36.0)
Platelets: 329 10*3/uL (ref 150–400)

## 2010-08-25 LAB — DIFFERENTIAL
Basophils Absolute: 0 10*3/uL (ref 0.0–0.1)
Basophils Relative: 0 % (ref 0–1)
Eosinophils Absolute: 0.1 10*3/uL (ref 0.0–0.7)
Monocytes Absolute: 0.4 10*3/uL (ref 0.1–1.0)
Neutro Abs: 8.9 10*3/uL — ABNORMAL HIGH (ref 1.7–7.7)
Neutrophils Relative %: 91 % — ABNORMAL HIGH (ref 43–77)

## 2010-08-25 LAB — MAGNESIUM: Magnesium: 1.9 mg/dL (ref 1.5–2.5)

## 2010-08-25 LAB — GLUCOSE, CAPILLARY
Glucose-Capillary: 330 mg/dL — ABNORMAL HIGH (ref 70–99)
Glucose-Capillary: 320 mg/dL — ABNORMAL HIGH (ref 70–99)

## 2010-08-26 ENCOUNTER — Ambulatory Visit (HOSPITAL_BASED_OUTPATIENT_CLINIC_OR_DEPARTMENT_OTHER): Payer: Medicare PPO | Attending: Pulmonary Disease

## 2010-08-26 ENCOUNTER — Inpatient Hospital Stay (HOSPITAL_COMMUNITY): Payer: Medicare PPO

## 2010-08-26 LAB — GLUCOSE, CAPILLARY
Glucose-Capillary: 220 mg/dL — ABNORMAL HIGH (ref 70–99)
Glucose-Capillary: 194 mg/dL — ABNORMAL HIGH (ref 70–99)
Glucose-Capillary: 205 mg/dL — ABNORMAL HIGH (ref 70–99)
Glucose-Capillary: 158 mg/dL — ABNORMAL HIGH (ref 70–99)
Glucose-Capillary: 140 mg/dL — ABNORMAL HIGH (ref 70–99)
Glucose-Capillary: 114 mg/dL — ABNORMAL HIGH (ref 70–99)
Glucose-Capillary: 83 mg/dL (ref 70–99)
Glucose-Capillary: 378 mg/dL — ABNORMAL HIGH (ref 70–99)
Glucose-Capillary: 271 mg/dL — ABNORMAL HIGH (ref 70–99)
Glucose-Capillary: 259 mg/dL — ABNORMAL HIGH (ref 70–99)

## 2010-08-26 LAB — PRO B NATRIURETIC PEPTIDE: Pro B Natriuretic peptide (BNP): 853.2 pg/mL — ABNORMAL HIGH (ref 0–125)

## 2010-08-26 LAB — BASIC METABOLIC PANEL
BUN: 34 mg/dL — ABNORMAL HIGH (ref 6–23)
CO2: 33 mEq/L — ABNORMAL HIGH (ref 19–32)
Chloride: 94 mEq/L — ABNORMAL LOW (ref 96–112)
Creatinine, Ser: 1.28 mg/dL (ref 0.4–1.5)
Glucose, Bld: 267 mg/dL — ABNORMAL HIGH (ref 70–99)

## 2010-08-26 LAB — ABO/RH: ABO/RH(D): O POS

## 2010-08-27 ENCOUNTER — Other Ambulatory Visit: Payer: Self-pay | Admitting: Thoracic Surgery (Cardiothoracic Vascular Surgery)

## 2010-08-27 ENCOUNTER — Inpatient Hospital Stay (HOSPITAL_COMMUNITY): Payer: Medicare PPO

## 2010-08-27 DIAGNOSIS — J8410 Pulmonary fibrosis, unspecified: Secondary | ICD-10-CM

## 2010-08-27 LAB — BLOOD GAS, ARTERIAL
Acid-Base Excess: 3.7 mmol/L — ABNORMAL HIGH (ref 0.0–2.0)
Drawn by: 246861
O2 Content: 100 L/min
O2 Saturation: 97.4 %
Patient temperature: 98.6
pO2, Arterial: 95.4 mmHg (ref 80.0–100.0)

## 2010-08-27 LAB — GLUCOSE, CAPILLARY
Glucose-Capillary: 202 mg/dL — ABNORMAL HIGH (ref 70–99)
Glucose-Capillary: 264 mg/dL — ABNORMAL HIGH (ref 70–99)

## 2010-08-27 LAB — BASIC METABOLIC PANEL
BUN: 33 mg/dL — ABNORMAL HIGH (ref 6–23)
CO2: 35 mEq/L — ABNORMAL HIGH (ref 19–32)
Chloride: 94 mEq/L — ABNORMAL LOW (ref 96–112)
Glucose, Bld: 194 mg/dL — ABNORMAL HIGH (ref 70–99)
Potassium: 3.9 mEq/L (ref 3.5–5.1)

## 2010-08-27 LAB — CBC
HCT: 37.4 % — ABNORMAL LOW (ref 39.0–52.0)
MCH: 28.3 pg (ref 26.0–34.0)
MCHC: 32.9 g/dL (ref 30.0–36.0)
MCV: 86 fL (ref 78.0–100.0)
Platelets: 297 10*3/uL (ref 150–400)
RDW: 15.9 % — ABNORMAL HIGH (ref 11.5–15.5)

## 2010-08-27 LAB — APTT: aPTT: 25 seconds (ref 24–37)

## 2010-08-27 NOTE — Discharge Summary (Signed)
NAME:  CUTLER, SUNDAY NO.:  [id number]  MEDICAL RECORD NO.:  [id number]           PATIENT TYPE:  I  LOCATION:  5506                         FACILITY:  [facility]  PHYSICIAN:  Felipa Evener, MD  DATE OF BIRTH:  Mar 18, 1951  DATE OF ADMISSION:  07/26/2010 DATE OF DISCHARGE:  08/03/2010                              DISCHARGE SUMMARY   DISCHARGE DIAGNOSES: 1. Acute on chronic hypoxemic respiratory failure with underlying     interstitial lung disease with chronic obstructive pulmonary     disease exacerbation and pneumonia. 2. Acute renal failure. 3. Sinus tachycardia. 4. Obesity. 5. Pneumonia. 6. Hyperglycemia. 7. Coronary artery disease.  HISTORY OF PRESENT ILLNESS:  Mr. 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  He has a recent pneumonia on July 07, 2010 and was seen at [facility] [place]  He was treated with Levaquin and discharged on July 16, 2010.  He completed Levaquin on July 21, 2010.  He was readmitted to [facility] on July 25, 2010 with shortness of breath x3 days, found to have worsening infiltrates on his chest x-ray.  Sats were 90% on room air and sinus tach at 120.  He was transferred to [facility] for further evaluation and treatment. Blood cultures were negative.  Urine culture was negative.  Sputum culture was negative.  Procalcitonin was less than 0.1.  HIV was negative.  MRSA PCR was negative.  His CRP on July 26, 2010 was 227. C3 and C4 were normal.  His ESR was 103.  His ANA and anti-DNA were negative.  His ANCA was negative.  His antibiotics consisted of vancomycin, ceftazidime, and Zithromax all of which were completed. Chest x-ray demonstrated a worsening right greater than left airspace disease, multifocal pneumonia at [facility].  He had a 2-D  echo that showed a left ventricular and right ventricular not well-visualized to evaluate functioning.  PA pressure is 42.  LABORATORY DATA:  Hemoglobin 12.4, hematocrit 38.4, WBC 14.0, and platelets 494.  Sodium 140, potassium 3.8, chloride 98, CO2 of 29, BUN of 27, creatinine is 1.46, and glucose 142.  Troponin-I is 0.03, CK-MB is 1.3, and CK is 37.  Calcium 9.7, magnesium 1.9, and phosphorus 5.4. BNP is 189.  Sjogren syndrome lab was negative.  On August 01, 2010, his chest x-ray demonstrated patchy airspace disease in right bowel with mild underlying interstitial prominence, overall appearance is improved.  HOSPITAL COURSE BY DISCHARGE DIAGNOSES: 1. Acute respiratory failure with hypoxemia with pulmonary infiltrates     and pneumonia.  He was noted to be hypoxemic.  He has underlying     obstructive sleep apnea.  He was treated with antibiotics and IV     steroids which have been transitioned to p.o. steroids and are on a     taper.  No culture data was positive.  He had a poor study done for     a 2-D echo.  His BNP was only mildly elevated.  He was  diuresed.     He was tried on nocturnal BiPAP but is claustrophobic, cannot     tolerate the nasal mask.  He may be able to tolerate a full face     mask in the future.  Again, he will be followed up with Dr. Kari Baars as an outpatient for his ongoing interstitial lung disease     and COPD and follow up with Dr. Craige Cotta for his obstructive sleep     apnea. 2. Acute renal failure.  His creatinine has returned to normal.  His     Lasix has been resumed. 3. Sinus tachycardia which has now resolved.  He does have underlying     coronary artery disease status post CABG.  He will follow up with     his cardiologist, Dr. Daryel November on August 06, 2010. 4. Obesity and diabetes.  He has very difficult to control diabetes     secondary to history of pancreatitis.  His pharmaceutical regimen     for diabetes is extremely complex and he is  being followed by an     endocrinologist at his home.  He will need to follow up with him in     the near future. 5. Pneumonia.  No organisms were identified.  He has concluded     empirical and microbial therapy.  DISCHARGE MEDICATIONS:  Metoprolol 25 mg twice a day.  Note, this is a new medication down from his long-acting metoprolol which he was on 200 mg a day.  Prednisone on a taper 30 mg for 3 days, 20 mg for 3 days, 10 mg for 3 days, and then stopped.  He is also on Senokot S by mouth 1 tablet at bedtime.  The rest of his medications remain the same.  His amlodipine 5 mg daily, aspirin 325 mg daily.  His dipyridamole 75 mg 3 times a day.  Humulin insulin, lispro is 45 units b.i.d.  His Humalog 50/50 insulin is 15 units twice daily.  His Imdur is 1 tablet 30 mg daily, Lantus 75 units twice daily, Lasix 20 mg 1-2 tablets by mouth daily, niacin 500 mg 2 tablets twice daily, nitroglycerin sublingual 1 tablet under tongue every 5 minutes as needed for chest pain, simvastatin 40 mg 2 tablets daily, vitamin B12 one tablet daily, vitamin C 1 tablet daily, vitamin E 1 tablet daily, and Zetia 10 mg by mouth daily.  He has stopped taking his metoprolol XL 200 mg twice daily.  DIET:  Heart-healthy diabetic diet.  DISPOSITION/CONDITION ON DISCHARGE:  Improved.  His diabetes is under better control.  His blood pressure is under better control.  His pneumonia has been treated, although no organisms were specified.  His obstructive sleep apnea has been identified.  He has a followup appointment with Dr. Craige Cotta with a sleep study.  He does need a CPAP machine, although he is having extreme difficulty tolerating the mass. He is being discharged in improved condition.  Note, this was an extremely complex discharge summary taking greater than 90 minutes.     Devra Dopp, MSN, ACNP   ______________________________ Felipa Evener, MD    SM/MEDQ  D:  08/03/2010  T:  08/03/2010  Job:   [id number]  cc:   Coralyn Helling, MD Oneal Deputy. Juanetta Gosling, M.D. Daryel November, M.D.  Electronically Signed by Devra Dopp MSN ACNP on 08/06/2010 04:43:47 PM Electronically Signed by Koren Bound MD on 08/27/2010 02:55:49 PM

## 2010-08-28 ENCOUNTER — Inpatient Hospital Stay (HOSPITAL_COMMUNITY): Payer: Medicare PPO

## 2010-08-28 ENCOUNTER — Encounter: Payer: Self-pay | Admitting: Pulmonary Disease

## 2010-08-28 LAB — CBC
Hemoglobin: 11.5 g/dL — ABNORMAL LOW (ref 13.0–17.0)
MCH: 27.8 pg (ref 26.0–34.0)
MCHC: 31.5 g/dL (ref 30.0–36.0)
MCV: 88.2 fL (ref 78.0–100.0)
Platelets: 306 10*3/uL (ref 150–400)
RBC: 4.14 MIL/uL — ABNORMAL LOW (ref 4.22–5.81)

## 2010-08-28 LAB — GLUCOSE, CAPILLARY: Glucose-Capillary: 246 mg/dL — ABNORMAL HIGH (ref 70–99)

## 2010-08-28 LAB — BASIC METABOLIC PANEL
BUN: 30 mg/dL — ABNORMAL HIGH (ref 6–23)
CO2: 38 mEq/L — ABNORMAL HIGH (ref 19–32)
Chloride: 95 mEq/L — ABNORMAL LOW (ref 96–112)
Creatinine, Ser: 1.01 mg/dL (ref 0.4–1.5)

## 2010-08-28 LAB — POCT I-STAT 3, ART BLOOD GAS (G3+)
O2 Saturation: 96 %
Patient temperature: 98.4
TCO2: 43 mmol/L (ref 0–100)

## 2010-08-28 LAB — CULTURE, BLOOD (ROUTINE X 2)
Culture  Setup Time: 201205160331
Culture: NO GROWTH

## 2010-08-29 ENCOUNTER — Inpatient Hospital Stay (HOSPITAL_COMMUNITY): Payer: Medicare PPO

## 2010-08-29 LAB — COMPREHENSIVE METABOLIC PANEL
ALT: 16 U/L (ref 0–53)
AST: 11 U/L (ref 0–37)
Albumin: 2.7 g/dL — ABNORMAL LOW (ref 3.5–5.2)
CO2: 38 mEq/L — ABNORMAL HIGH (ref 19–32)
Calcium: 9.4 mg/dL (ref 8.4–10.5)
Chloride: 92 mEq/L — ABNORMAL LOW (ref 96–112)
Creatinine, Ser: 1.1 mg/dL (ref 0.4–1.5)
GFR calc Af Amer: >60 mL/min (ref >60)
GFR calc non Af Amer: >60 mL/min (ref >60)
Sodium: 138 mEq/L (ref 135–145)

## 2010-08-29 LAB — GLUCOSE, CAPILLARY
Glucose-Capillary: 210 mg/dL — ABNORMAL HIGH (ref 70–99)
Glucose-Capillary: 303 mg/dL — ABNORMAL HIGH (ref 70–99)

## 2010-08-29 LAB — TYPE AND SCREEN: Unit division: 0

## 2010-08-29 LAB — CBC
Hemoglobin: 11.7 g/dL — ABNORMAL LOW (ref 13.0–17.0)
MCH: 28.6 pg (ref 26.0–34.0)
MCHC: 32.6 g/dL (ref 30.0–36.0)
Platelets: 260 10*3/uL (ref 150–400)
RBC: 4.09 MIL/uL — ABNORMAL LOW (ref 4.22–5.81)

## 2010-08-30 ENCOUNTER — Inpatient Hospital Stay (HOSPITAL_COMMUNITY): Payer: Medicare PPO

## 2010-08-30 LAB — BASIC METABOLIC PANEL
BUN: 34 mg/dL — ABNORMAL HIGH (ref 6–23)
Calcium: 9.7 mg/dL (ref 8.4–10.5)
Creatinine, Ser: 1.2 mg/dL (ref 0.4–1.5)
GFR calc non Af Amer: >60 mL/min (ref >60)
Glucose, Bld: 151 mg/dL — ABNORMAL HIGH (ref 70–99)

## 2010-08-30 LAB — CBC
HCT: 34.6 % — ABNORMAL LOW (ref 39.0–52.0)
MCH: 29.1 pg (ref 26.0–34.0)
MCHC: 33.8 g/dL (ref 30.0–36.0)
MCV: 86.1 fL (ref 78.0–100.0)
Platelets: 263 10*3/uL (ref 150–400)
RDW: 16 % — ABNORMAL HIGH (ref 11.5–15.5)

## 2010-08-30 LAB — GLUCOSE, CAPILLARY
Glucose-Capillary: 218 mg/dL — ABNORMAL HIGH (ref 70–99)
Glucose-Capillary: 360 mg/dL — ABNORMAL HIGH (ref 70–99)
Glucose-Capillary: 107 mg/dL — ABNORMAL HIGH (ref 70–99)
Glucose-Capillary: 255 mg/dL — ABNORMAL HIGH (ref 70–99)

## 2010-08-31 ENCOUNTER — Inpatient Hospital Stay (HOSPITAL_COMMUNITY): Payer: Medicare PPO

## 2010-08-31 LAB — TISSUE CULTURE
Culture: NO GROWTH
Culture: NO GROWTH

## 2010-08-31 LAB — URIC ACID: Uric Acid, Serum: 10.5 mg/dL — ABNORMAL HIGH (ref 4.0–7.8)

## 2010-08-31 LAB — BASIC METABOLIC PANEL
BUN: 33 mg/dL — ABNORMAL HIGH (ref 6–23)
CO2: 35 mEq/L — ABNORMAL HIGH (ref 19–32)
Chloride: 96 mEq/L (ref 96–112)
Creatinine, Ser: 1.22 mg/dL (ref 0.4–1.5)
GFR calc Af Amer: >60 mL/min (ref >60)
Glucose, Bld: 196 mg/dL — ABNORMAL HIGH (ref 70–99)

## 2010-08-31 LAB — CBC
MCV: 86.2 fL (ref 78.0–100.0)
Platelets: 256 10*3/uL (ref 150–400)
RBC: 3.84 MIL/uL — ABNORMAL LOW (ref 4.22–5.81)
RDW: 16.2 % — ABNORMAL HIGH (ref 11.5–15.5)
WBC: 20.8 10*3/uL — ABNORMAL HIGH (ref 4.0–10.5)

## 2010-08-31 LAB — GLUCOSE, CAPILLARY
Glucose-Capillary: 217 mg/dL — ABNORMAL HIGH (ref 70–99)
Glucose-Capillary: 328 mg/dL — ABNORMAL HIGH (ref 70–99)

## 2010-09-01 ENCOUNTER — Inpatient Hospital Stay (HOSPITAL_COMMUNITY): Payer: Medicare PPO

## 2010-09-01 LAB — ANAEROBIC CULTURE

## 2010-09-01 LAB — BASIC METABOLIC PANEL
BUN: 32 mg/dL — ABNORMAL HIGH (ref 6–23)
Calcium: 9.9 mg/dL (ref 8.4–10.5)
GFR calc non Af Amer: >60 mL/min (ref >60)
Glucose, Bld: 179 mg/dL — ABNORMAL HIGH (ref 70–99)
Sodium: 137 mEq/L (ref 135–145)

## 2010-09-01 LAB — CBC
HCT: 37 % — ABNORMAL LOW (ref 39.0–52.0)
MCH: 29.6 pg (ref 26.0–34.0)
MCHC: 32.2 g/dL (ref 30.0–36.0)
MCV: 86.4 fL (ref 78.0–100.0)
Platelets: 344 10*3/uL (ref 150–400)
RBC: 3.89 MIL/uL — ABNORMAL LOW (ref 4.22–5.81)
RDW: 16.5 % — ABNORMAL HIGH (ref 11.5–15.5)

## 2010-09-01 LAB — PRO B NATRIURETIC PEPTIDE: Pro B Natriuretic peptide (BNP): 916.5 pg/mL — ABNORMAL HIGH (ref 0–125)

## 2010-09-01 LAB — GLUCOSE, CAPILLARY
Glucose-Capillary: 442 mg/dL — ABNORMAL HIGH (ref 70–99)
Glucose-Capillary: 354 mg/dL — ABNORMAL HIGH (ref 70–99)

## 2010-09-02 ENCOUNTER — Inpatient Hospital Stay (HOSPITAL_COMMUNITY): Payer: Medicare PPO

## 2010-09-02 LAB — GLUCOSE, CAPILLARY: Glucose-Capillary: 254 mg/dL — ABNORMAL HIGH (ref 70–99)

## 2010-09-03 ENCOUNTER — Inpatient Hospital Stay (HOSPITAL_COMMUNITY): Payer: Medicare PPO

## 2010-09-03 LAB — BASIC METABOLIC PANEL
BUN: 31 mg/dL — ABNORMAL HIGH (ref 6–23)
BUN: 27 mg/dL — ABNORMAL HIGH (ref 6–23)
CO2: 32 mEq/L (ref 19–32)
Chloride: 98 mEq/L (ref 96–112)
Chloride: 92 mEq/L — ABNORMAL LOW (ref 96–112)
Creatinine, Ser: 1.04 mg/dL (ref 0.4–1.5)
GFR calc non Af Amer: >60 mL/min (ref >60)
Glucose, Bld: 130 mg/dL — ABNORMAL HIGH (ref 70–99)
Glucose, Bld: 212 mg/dL — ABNORMAL HIGH (ref 70–99)
Potassium: 3.6 mEq/L (ref 3.5–5.1)
Potassium: 4.3 mEq/L (ref 3.5–5.1)
Sodium: 137 mEq/L (ref 135–145)

## 2010-09-03 LAB — CBC
HCT: 34.1 % — ABNORMAL LOW (ref 39.0–52.0)
Hemoglobin: 11 g/dL — ABNORMAL LOW (ref 13.0–17.0)
MCH: 27.8 pg (ref 26.0–34.0)
MCV: 86.1 fL (ref 78.0–100.0)
RBC: 3.96 MIL/uL — ABNORMAL LOW (ref 4.22–5.81)
WBC: 13.6 10*3/uL — ABNORMAL HIGH (ref 4.0–10.5)

## 2010-09-03 LAB — GLUCOSE, CAPILLARY
Glucose-Capillary: 189 mg/dL — ABNORMAL HIGH (ref 70–99)
Glucose-Capillary: 202 mg/dL — ABNORMAL HIGH (ref 70–99)
Glucose-Capillary: 277 mg/dL — ABNORMAL HIGH (ref 70–99)

## 2010-09-03 LAB — CARDIAC PANEL(CRET KIN+CKTOT+MB+TROPI)
Relative Index: INVALID (ref 0.0–2.5)
Total CK: 29 U/L (ref 7–232)
Troponin I: <0.3 ng/mL (ref <0.30)

## 2010-09-04 ENCOUNTER — Institutional Professional Consult (permissible substitution): Payer: Medicare PPO | Admitting: Pulmonary Disease

## 2010-09-04 ENCOUNTER — Inpatient Hospital Stay (HOSPITAL_COMMUNITY): Payer: Medicare PPO

## 2010-09-04 DIAGNOSIS — J9600 Acute respiratory failure, unspecified whether with hypoxia or hypercapnia: Secondary | ICD-10-CM

## 2010-09-04 DIAGNOSIS — I509 Heart failure, unspecified: Secondary | ICD-10-CM

## 2010-09-04 DIAGNOSIS — J8410 Pulmonary fibrosis, unspecified: Secondary | ICD-10-CM

## 2010-09-04 LAB — CBC
Hemoglobin: 11.3 g/dL — ABNORMAL LOW (ref 13.0–17.0)
MCH: 28 pg (ref 26.0–34.0)
MCHC: 32.8 g/dL (ref 30.0–36.0)
Platelets: 243 10*3/uL (ref 150–400)
RBC: 4.03 MIL/uL — ABNORMAL LOW (ref 4.22–5.81)

## 2010-09-04 LAB — PRO B NATRIURETIC PEPTIDE: Pro B Natriuretic peptide (BNP): 1364 pg/mL — ABNORMAL HIGH (ref 0–125)

## 2010-09-04 LAB — GLUCOSE, CAPILLARY
Glucose-Capillary: 293 mg/dL — ABNORMAL HIGH (ref 70–99)
Glucose-Capillary: 258 mg/dL — ABNORMAL HIGH (ref 70–99)
Glucose-Capillary: 290 mg/dL — ABNORMAL HIGH (ref 70–99)
Glucose-Capillary: 303 mg/dL — ABNORMAL HIGH (ref 70–99)
Glucose-Capillary: 316 mg/dL — ABNORMAL HIGH (ref 70–99)

## 2010-09-04 LAB — BASIC METABOLIC PANEL
Calcium: 9.7 mg/dL (ref 8.4–10.5)
Creatinine, Ser: 1.21 mg/dL (ref 0.4–1.5)
GFR calc Af Amer: >60 mL/min (ref >60)
Sodium: 137 mEq/L (ref 135–145)

## 2010-09-05 ENCOUNTER — Inpatient Hospital Stay (HOSPITAL_COMMUNITY): Payer: Medicare PPO

## 2010-09-05 LAB — GLUCOSE, CAPILLARY
Glucose-Capillary: 210 mg/dL — ABNORMAL HIGH (ref 70–99)
Glucose-Capillary: 227 mg/dL — ABNORMAL HIGH (ref 70–99)
Glucose-Capillary: 335 mg/dL — ABNORMAL HIGH (ref 70–99)
Glucose-Capillary: 225 mg/dL — ABNORMAL HIGH (ref 70–99)

## 2010-09-05 LAB — BASIC METABOLIC PANEL
BUN: 41 mg/dL — ABNORMAL HIGH (ref 6–23)
CO2: 31 mEq/L (ref 19–32)
Chloride: 94 mEq/L — ABNORMAL LOW (ref 96–112)
Creatinine, Ser: 1.41 mg/dL (ref 0.4–1.5)
Glucose, Bld: 248 mg/dL — ABNORMAL HIGH (ref 70–99)
Potassium: 4.4 mEq/L (ref 3.5–5.1)

## 2010-09-05 LAB — CBC
HCT: 34.8 % — ABNORMAL LOW (ref 39.0–52.0)
MCH: 27.7 pg (ref 26.0–34.0)
MCV: 85.9 fL (ref 78.0–100.0)
RBC: 4.05 MIL/uL — ABNORMAL LOW (ref 4.22–5.81)
WBC: 21.1 10*3/uL — ABNORMAL HIGH (ref 4.0–10.5)

## 2010-09-05 NOTE — Consult Note (Signed)
NAME:  Timothy Trujillo, TURNEY NO.:  [id number]  MEDICAL RECORD NO.:  [id number]           PATIENT TYPE:  LOCATION:                                 FACILITY:  PHYSICIAN:  Salvatore Decent. Dorris Fetch, M.D.DATE OF BIRTH:  Sep 13, 1950  DATE OF CONSULTATION:  08/23/2010 DATE OF DISCHARGE:                                CONSULTATION   REASON FOR CONSULTATION:  Consider open lung biopsy for interstitial lung disease.  HISTORY OF PRESENT ILLNESS:  Timothy Trujillo is a 60 year old gentleman readmitted with acute on chronic respiratory failure.  He has a past history of coronary disease and had coronary bypass grafting at [facility] 19 years ago.  He began having problems with his breathing back in March. He was initially evaluated at [facility] and had cardiac catheterization where he was told that things were stable for 4-5 years ago and there were no acute issues in terms of his coronary disease or cardiac function.  He was admitted at [facility] at the end of March through the first portion of April.  Extensive medical workup was done.  He was treated for pneumonia with azithromycin, vancomycin, and ceftazidime. He was treated for a portion of time at [facility].  He was discharged a couple of weeks ago.  He said about 2 days after his antibiotics were completed he began having shortness of breath.  Again, he was readmitted at [facility] on Aug 16, 2010, with recurrent respiratory failure and once again transferred to [facility] for additional management.  He initially was requiring BiPAP for acute respiratory failure.  He was noted to have elevated BNP and has responded to diuretics, however, that does not appear to be the sole cause of his respiratory issues.  Of note, an echocardiogram was done on this admission where his ejection fraction was noted to be normal.  It was a technically limited study, but there was no notable valvular pathology.  I am now asked to see the patient to  discuss possibility of open lung biopsy to further delineate his underlying pulmonary pathology.  His past medical history is significant for coronary artery disease with coronary bypass grafting 15 years ago, acute on chronic respiratory failure, recent treatment with antibiotics for possible pneumonia, hypertension, adult onset type 2 diabetes, acute renal failure during his last admission, obesity, chronic constipation.  His medications on admission were: 1. Colace 100 mg b.i.d. as needed. 2. DuoNeb every 4 hours as needed. 3. Oxazepam 10 mg at bedtime as needed. 4. Vancomycin 1.75 g q.12 h. 5. Levofloxacin 500 mg IV daily which was started at [facility]. 6. Solu-Medrol 40 mg q.8 h. 7. Protonix 40 mg daily. 8. Dipyridamole 75 mg t.i.d. 9. Imdur 30 mg daily. 10.Aspirin 325 mg daily. 11.Lipitor 40 mg at bedtime. 12.Zetia 10 mg daily. 13.Lantus 15 units subcu twice daily. 14.Lasix 80 mg p.o. t.i.d.  He currently is on vancomycin, subcutaneous heparin, Protonix, albuterol, Atrovent, Lantus insulin 10 units subcu q.24 h., Lasix 40 mg IV daily, Solu-Medrol 60 mg b.i.d., Maxipime 2 g q.12 h., vancomycin 1750 mg daily.  He has an  allergy to Stadol which causes delirium.  FAMILY HISTORY:  Significant for cancer and heart disease.  SOCIAL HISTORY:  The patient quit smoking 30 years ago.  PHYSICAL EXAMINATION:  GENERAL:  Timothy Trujillo is an obese 60 year old white male in no acute distress.  He is on a non-rebreather mask and does become breathless with talking. VITAL SIGNS:  He is afebrile.  Blood pressure is 151/90, pulse 93 and sinus rhythm with frequent PACs. NEUROLOGIC:  He is alert and oriented x3 with no focal deficits. HEENT:  Unremarkable. NECK:  Without thyromegaly or adenopathy. LUNGS:  Distant breath sounds bilaterally. CARDIAC:  Regular rate and rhythm.  There is no murmur. ABDOMEN:  Soft, nontender. EXTREMITIES:  Cool but have palpable  pulses.  LABORATORY DATA:  Pro-BNP 13.47 down from 22.53.  ANA is negative. Blood cultures, no growth.  Sodium 140, potassium 3.8, BUN 36, creatinine 1.22.  White count 11, hematocrit 38, platelets 359.  C3 and C4 complement within normal ranges.  Urine culture positive for yeast. C-reactive protein elevated at 1.5 ruled out for MI with negative cardiac panel.  Sed rate is elevated at 40.  Legionella antigen is negative.  IMPRESSION:  Timothy Trujillo is a 60 year old gentleman who presents with acute on chronic respiratory failure.  He has essentially been in respiratory failure on at least a subacute basis since March of this year.  He has had extensive diagnostic workup which so far has been unrevealing.  There is certainly a component of congestive heart failure and his BNP is still elevated.  Some of this picture is consistent with interstitial edema, but given his normal left ventricular function and no acute coronary lesions, it does not explain the acute nature of that nor its prolonged course.  This certainly is another process to play in his case, but it is unclear whether the underlying pulmonary pathology represents bronchiolitis obliterans, organizing pneumonia, some type of hypersensitivity reaction, and atypical infection or some other inflammatory process or baseline underlying interstitial lung disease. Malignancy is also within the differential but certainly be an atypical picture for that.  At this point, I do think VATS biopsy for diagnostic purposes is warranted to establish the underlying pulmonary pathology to hopefully guide appropriate treatment.  I discussed the option with the patient for VATS lung biopsy.  He understands the procedure is in no way therapeutic and is diagnostic only.  I discussed with him the indications, risks, benefits, and alternatives.  He understands that the risks include but not limited to death, MI, stroke, bleeding, infection, possible  need for transfusions, prolonged air leak, as well as other unpredictable or unforeseeable complications and in particular respiratory failure or mechanical ventilation.  He understands and accepts risks of surgery and agrees to proceed.  I do think that he could benefit from some additional medical optimization prior to surgery to help minimize his risk for postoperative respiratory failure.  In particular, I think additional diuresis is indicated but his pro-BNP is still being 1300.  I will discuss with Dr. Tyson Alias but we would likely think that the patient would benefit from a few more days and then plan to do the lung biopsy early next week.     Salvatore Decent Dorris Fetch, M.D.     SCH/MEDQ  D:  08/23/2010  T:  08/24/2010  Job:  [id number]  cc:   Nelda Bucks, MD  Electronically Signed by Charlett Lango M.D. on 09/05/2010 03:37:13 PM

## 2010-09-05 NOTE — Op Note (Signed)
NAME:  Timothy Trujillo, Timothy Trujillo NO.:  [id number]  MEDICAL RECORD NO.:  [id number]           PATIENT TYPE:  I  LOCATION:  2315                         FACILITY:  [facility]  PHYSICIAN:  Salvatore Decent. Dorris Fetch, M.D.DATE OF BIRTH:  [birth year]  DATE OF PROCEDURE:  08/27/2010 DATE OF DISCHARGE:                              OPERATIVE REPORT   PREOPERATIVE DIAGNOSIS:  Interstitial lung disease.  POSTOPERATIVE DIAGNOSIS:  Interstitial lung disease.  PROCEDURE:  Right VATS, right lung biopsy x3.  SURGEON:  Salvatore Decent. Dorris Fetch, MD  ASSISTANT:  Stephanie Acre Dasovich, PA  ANESTHESIA:  General.  FINDINGS:  Lung diffusely edematous and nodular.  Frozen section revealed intense inflammatory process with some atypia.  Specimen sent for culture as well as Pathology.  CLINICAL NOTE:  Timothy Trujillo is a 60 year old morbidly obese gentleman who was recently admitted with recurrent acute on chronic respiratory failure.  He has recently been treated for pneumonia and has bilateral severe ground-glass opacities on chest CT consistent with an inflammatory process of an unknown etiology.  The patient was advised to undergo lung biopsy for diagnostic purposes.  The indications, risks, benefits, and alternatives were discussed in detail with the patient. He understood and accepted the risks and agreed to proceed.  OPERATIVE NOTE:  Timothy Trujillo was brought to the preop holding area on Aug 27, 2010.  Of note, the patient was on a non-rebreather mask with marginal respiratory status prior to surgery.  Anesthesia placed a central line and an arterial blood pressure monitoring catheter.  He was taken to the operating room, anesthetized, and intubated with a double- lumen endotracheal tube.  A Foley catheter was placed.  PAS hose were placed for DVT prophylaxis.  He was placed in a left lateral decubitus position.  The right chest was prepped and draped in usual fashion. Single lung ventilation of the  left lung was carried out.  The patient tolerated this well for short periods of time, but then intermittently required reinflation and ventilation of the right lung during the procedure.  An incision made in the seventh intercostal space in the midaxillary line and was carried through the skin and subcutaneous tissue.  Chest was entered bluntly using hemostat.  A port was inserted and the thoracoscope was placed in the chest.  Additional port incisions were made anteriorly and posteriorly and approximately fifth intercostal space. Lung was visualized.  There was significant nodularity to the lungs as well as multiple punctate white lesions with miliary-type appearance representative portion of the right middle lobe was identified and an excisional biopsy was performed with sequential firings of Endo-GIA stapler.  Specimen was removed, a portion was sent for bacterial, fungal, and AFB cultures and stains.  The remainder was sent for permanent pathology.  The lower lobe was a little more severely involved.  Two separate biopsies were done at the lower lobe, one area which was more edematous, but still nodular and the other area where there was much more matured nodularity to the specimen.  The portion of the first specimen was sent for frozen section as well as permanent pathology.  Frozen section revealed  some atypical cells and intense inflammatory reactive process, possibly an organizing pneumonia-type picture.  This third specimen was sent for culture as well as permanent pathology.  All staple lines were inspected.  There was no significant bleeding from the staple lines.  ProGel was applied.  A 28-French chest tube was placed through the original incision and secured with a #1 silk suture.  The right lung was reinflated.  The remaining 2 port incisions were closed with #1 Vicryl fascial sutures, 2-0 Vicryl subcutaneous sutures, and 3-0 Vicryl subcuticular sutures.  All sponge, needle,  and sponge counts were correct at the end of the procedure.  The patient was extubated in the operating room and taken to the postanesthetic care unit in fair condition.     Salvatore Decent Dorris Fetch, M.D.     SCH/MEDQ  D:  08/27/2010  T:  08/28/2010  Job:  [id number]  cc:   Nelda Bucks, MD  Electronically Signed by Charlett Lango M.D. on 09/05/2010 03:37:18 PM

## 2010-09-06 LAB — POCT I-STAT 7, (LYTES, BLD GAS, ICA,H+H)
Acid-Base Excess: 7 mmol/L — ABNORMAL HIGH (ref 0.0–2.0)
O2 Saturation: 98 % (ref 90.0–100.0)
Patient temperature: 35.9
Potassium: 4.4 mEq/L (ref 3.5–5.1)
TCO2: 38 mmol/L (ref 0–100)
pCO2 arterial: 70.2 mmHg (ref 35.0–45.0)
pO2, Arterial: 126 mmHg — ABNORMAL HIGH (ref 80.0–100.0)

## 2010-09-06 LAB — GLUCOSE, CAPILLARY: Glucose-Capillary: 196 mg/dL — ABNORMAL HIGH (ref 70–99)

## 2010-09-06 LAB — BASIC METABOLIC PANEL
Calcium: 9.4 mg/dL (ref 8.4–10.5)
GFR calc non Af Amer: 53 mL/min — ABNORMAL LOW (ref >60)
Glucose, Bld: 190 mg/dL — ABNORMAL HIGH (ref 70–99)
Sodium: 139 mEq/L (ref 135–145)

## 2010-09-06 LAB — PRO B NATRIURETIC PEPTIDE: Pro B Natriuretic peptide (BNP): 1244 pg/mL — ABNORMAL HIGH (ref 0–125)

## 2010-09-07 LAB — GLUCOSE, CAPILLARY
Glucose-Capillary: 240 mg/dL — ABNORMAL HIGH (ref 70–99)
Glucose-Capillary: 286 mg/dL — ABNORMAL HIGH (ref 70–99)
Glucose-Capillary: 298 mg/dL — ABNORMAL HIGH (ref 70–99)

## 2010-09-07 LAB — BASIC METABOLIC PANEL
BUN: 41 mg/dL — ABNORMAL HIGH (ref 6–23)
Creatinine, Ser: 1.29 mg/dL (ref 0.4–1.5)
GFR calc non Af Amer: 57 mL/min — ABNORMAL LOW (ref >60)

## 2010-09-08 LAB — GLUCOSE, CAPILLARY
Glucose-Capillary: 265 mg/dL — ABNORMAL HIGH (ref 70–99)
Glucose-Capillary: 232 mg/dL — ABNORMAL HIGH (ref 70–99)

## 2010-09-09 DIAGNOSIS — J8410 Pulmonary fibrosis, unspecified: Secondary | ICD-10-CM

## 2010-09-09 DIAGNOSIS — J9600 Acute respiratory failure, unspecified whether with hypoxia or hypercapnia: Secondary | ICD-10-CM

## 2010-09-09 DIAGNOSIS — I509 Heart failure, unspecified: Secondary | ICD-10-CM

## 2010-09-09 LAB — GLUCOSE, CAPILLARY: Glucose-Capillary: 260 mg/dL — ABNORMAL HIGH (ref 70–99)

## 2010-09-10 LAB — GLUCOSE, CAPILLARY
Glucose-Capillary: 144 mg/dL — ABNORMAL HIGH (ref 70–99)
Glucose-Capillary: 271 mg/dL — ABNORMAL HIGH (ref 70–99)

## 2010-09-11 ENCOUNTER — Inpatient Hospital Stay (HOSPITAL_COMMUNITY): Payer: Medicare PPO

## 2010-09-11 LAB — GLUCOSE, CAPILLARY
Glucose-Capillary: 130 mg/dL — ABNORMAL HIGH (ref 70–99)
Glucose-Capillary: 172 mg/dL — ABNORMAL HIGH (ref 70–99)

## 2010-09-11 LAB — BASIC METABOLIC PANEL
BUN: 39 mg/dL — ABNORMAL HIGH (ref 6–23)
Chloride: 97 mEq/L (ref 96–112)
Creatinine, Ser: 1.01 mg/dL (ref 0.4–1.5)
GFR calc non Af Amer: >60 mL/min (ref >60)

## 2010-09-12 LAB — GLUCOSE, CAPILLARY
Glucose-Capillary: 136 mg/dL — ABNORMAL HIGH (ref 70–99)
Glucose-Capillary: 164 mg/dL — ABNORMAL HIGH (ref 70–99)

## 2010-09-13 LAB — GLUCOSE, CAPILLARY
Glucose-Capillary: 72 mg/dL (ref 70–99)
Glucose-Capillary: 87 mg/dL (ref 70–99)
Glucose-Capillary: 176 mg/dL — ABNORMAL HIGH (ref 70–99)
Glucose-Capillary: 217 mg/dL — ABNORMAL HIGH (ref 70–99)

## 2010-09-21 NOTE — Discharge Summary (Signed)
NAME:  TAMMY, ERICSSON NO.:  [id number]  MEDICAL RECORD NO.:  [id number]  LOCATION:  4739                         FACILITY:  [facility]  PHYSICIAN:  Coralyn Helling, MD        DATE OF BIRTH:  1950-08-07  DATE OF ADMISSION:  08/21/2010 DATE OF DISCHARGE:  09/13/2010                              DISCHARGE SUMMARY   DISCHARGE DIAGNOSES: 1. Interstitial lung disease. 2. Hypertension. 3. Diabetes. 4. Deconditioning. 5. Obstructive sleep apnea. 6. Left foot pain. 7. Hypoxic respiratory failure. 8. Chronic obstructive pulmonary disease.  HISTORY OF PRESENT ILLNESS:  Mr. Belcher is a 60 year old male with recent complicated medical history who has had multiple hospital admissions in the past several months for acute-on-chronic respiratory failure.  The patient was originally admitted on July 07, 2010 to [facility] and treated for questionable pneumonia.  He was discharged on July 16, 2010 and subsequently readmitted to [facility] on July 25, 2010 with shortness of breath.  At that time, he was transferred to [facility] for further evaluation and treatment and initially required BiPAP.  An autoimmune panel was essentially negative.  The patient was again treated with antibiotics and discharged on August 03, 2010.  The patient was readmitted to [facility] on Aug 16, 2010 with recurrent acute-on-chronic respiratory failure and was again transferred to [facility] for respiratory failure requiring BiPAP and further workup for unknown etiology of recurrent respiratory failure.  LABORATORY DATA:  At the time of admission to [facility] on Aug 21, 2010, CBC white blood cells 8.6, hemoglobin 11.4, hematocrit 35.1 and platelets 325.  Basic metabolic panel sodium 143, potassium 3.4, glucose 177, BUN 31, creatinine 1.17.  Lactic acid 1.3.  Arterial blood gas pH 7.50, pCO2 46, pO2 63 and bicarb 37.3.  Urinalysis was essentially  negative.  Urine strep negative.  BNP was 2253.  Other pertinent laboratory data on Aug 22, 2010 for calcitonin less than 0.10, ESR 40, CRP was 1.5, C3 complement 161, T4 complement 31 and ANA was negative, ANCA was negative.  Most recent laboratory data on September 11, 2010, basic metabolic panel sodium 137, potassium 3.5, glucose 174, BUN 39, creatinine 1.01.  MICRO DATA:  Blood cultures x2 on Aug 21, 2010, final report is no growth.  Urine culture on Aug 21, 2010 final report is greater than 100,000 colonies of yeast.  Urine Legionella was negative.  Fungal culture and smear from right lower lobe, VATS.  Preliminary reports are negative.  AFB preliminary report negative.  Tissue culture from right middle lobe negative from right lower lobe negative and aerobic culture from right middle lobe negative.  PATHOLOGY DATA:  Surgical pathology from right VATS right lower lobe, right middle lobe all show mixed acute lung injury pattern mainly organizing pneumonia and focal honeycombing.  RADIOLOGY DATA:  At the time of admission Aug 21, 2010, portable chest x- ray shows diffuse bilateral airspace disease progressed from prior study.  CT of the chest on Aug 22, 2010 shows widespread pattern of ground glass with some regional sparing at the apices, mild bronchiectasis in the lower lobe with atelectasis and slightly more pronounced alveolar filling, right  more than left.  Atypical infectious pneumonia, pulmonary edema, allergic lung disease could have similar appearances likely some bronchiectasis in the lower lobes, right more than left with associated atelectasis.  CT maxillofacial sinuses shows slight deviation of the posterior lower midportion.  Nose, nasal septal perforation, mucosal thickening involving the maxillary sinuses and ovale opacities consistent with mucus retention cyst or polyps, non development of the right frontal sinus and minimal mucosal thickening involving a few at the  ethmoid air cells, more on the left.  No air- fluid levels.  Right ankle x-ray shows remote medial malleolar tip trauma and no acute osseous findings.  Most recent radiology data September 11, 2010, two-view chest x-ray shows congestive heart failure with improved airspace edema, small bilateral effusions and cardiomegaly.  HOSPITAL COURSE BY DISCHARGE DIAGNOSES: 1. Interstitial lung disease/acute lung injury.  The patient underwent     VATS on Aug 27, 2010, see above for pathology findings, essentially     the patient has acute lung injury and potentially organizing     pneumonia.  The patient was treated with multiple course of     antibiotics.  At the time of discharge, his respiratory status     continues to improve.  He remains O2 dependent.  He has been     treated with aggressive IV steroids, although unclear these have     made any improvement.  The patient at this time is on 40 mg p.o.     daily of prednisone.  This should be tapered very slowly over the     next 6 to 8 weeks.  The patient will follow up with Dr. Juanetta Gosling in     the pulmonary office in [place], [place]. 2. Hypertension.  The patient will continue on his p.o.     antihypertensives. 3. Diabetes.  The patient was treated as an inpatient with ICU     hyperglycemia protocol and transitioned to sliding scale insulin     with meal coverage as well as subcu Lantus. 4. Deconditioning.  The patient is severely deconditioned from     physical as well as respiratory standpoint.  He requires further     physical, occupational therapy and will be discharged to short-term     skilled nursing facility for rehab with continued physical and     occupational therapy. 5. Obstructive sleep apnea.  The patient has been worked up for this     in the past and cannot tolerate CPAP.  No further workup appears     indicated at this time as the patient cannot tolerate CPAP. 6. Left foot pain is likely secondary to gout, it has not  necessarily     interfered with the patient's physical therapy and has been treated     with p.o. pain medications. 7. Hypoxic respiratory failure secondary to acute lung injury as well     as obstructive sleep apnea and chronic obstructive pulmonary     disease.  Again, the patient continues on nasal O2 and we will     likely need this for sometime, it is unclear how much respiratory     reserve he will regain and we will likely have some degree of     respiratory compromise in the future.  The patient will again     follow up with Dr. Juanetta Gosling in outpatient setting. 8. Chronic obstructive pulmonary disease.  The patient been treated     with high-dose steroids and aggressive bronchodilator therapy as  well as pulmonary hygiene and the patient will follow up with Dr.     Juanetta Gosling in outpatient setting for this.  DISCHARGE MEDICATIONS: 1. Tylenol 650 mg p.o. q.6 hours p.r.n. for pain or fever. 2. Albuterol 2.5 mg inhaled q.3 hours p.r.n. 3. Xanax 0.5 mg p.o. b.i.d. p.r.n. 4. Aspirin 325 mg p.o. daily. 5. Dulcolax 10 mg p.o. daily. 6. Lasix 20 mg p.o. daily. 7. Sliding scale insulin at a.c. at bedtime. 8. NovoLog 10 units subcu t.i.d. with meals as meal coverage. 9. Lantus 50 units subcu daily. 10.Lantus 60 units subcu daily at bedtime. 11.Atrovent 0.25 mg inhaled q.3 hours p.r.n. 12.Metoprolol 25 mg p.o. q.12 hours. 13.Oxycodone/APAP 5/325, 1 to 2 tablets p.o. q.4 hours p.r.n. for     pain. 14.Protonix 40 mg p.o. daily. 15.Prednisone 40 mg p.o. daily with a meal. 16.Crestor 20 mg p.o. daily. 17.Nasal saline spray 2 sprays nasally p.r.n. 18.Ultram 50 mg to 100 mg p.o. q.6 hours p.r.n. for pain. 19.Ambien 10 mg p.o. at bedtime as needed for sleep.  DISCHARGE ACTIVITY:  The patient is to increase activity slowly and walk with assistance.  Further recommendations per activity per physical and occupational therapy at [facility].  DIET:  Low-sodium heart-healthy  and diabetic diet.  DISCHARGE FOLLOWUP: 1. The patient is to follow Dr. Kari Baars in [facility]     [facility] on September 24, 2010 at 2:00 p.m. 2. The patient is to follow with Dr. Kathryne Sharper, his cardiologist in     [place], [place] on November 16, 2010 at 2:00 p.m.  DISPOSITION:  The patient has met maximum benefit from his inpatient hospitalization.  He has residual acute lung injury/interstitial lung disease and we will continue to follow up with this on an outpatient basis.  He is very deconditioned and therefore will be discharged to skilled nursing facility at [facility] in [place], [place].  Greater than 60 minutes was spent on this discharge.     Dirk Dress, NP   ______________________________ Coralyn Helling, MD    KW/MEDQ  D:  09/13/2010  T:  09/13/2010  Job:  [id number]  cc:   Ramon Dredge L. Juanetta Gosling, M.D. Laural Golden, MD  Electronically Signed by Danford Bad N.P. on 09/20/2010 03:29:40 PM Electronically Signed by Coralyn Helling MD on 09/21/2010 04:32:57 PM

## 2010-09-24 LAB — FUNGUS CULTURE W SMEAR
Fungal Smear: NONE SEEN
Fungal Smear: NONE SEEN

## 2010-09-27 ENCOUNTER — Inpatient Hospital Stay: Payer: Medicare PPO | Admitting: Adult Health

## 2010-10-01 DIAGNOSIS — I5023 Acute on chronic systolic (congestive) heart failure: Secondary | ICD-10-CM

## 2010-10-01 DIAGNOSIS — I2510 Atherosclerotic heart disease of native coronary artery without angina pectoris: Secondary | ICD-10-CM

## 2010-10-03 DIAGNOSIS — R000 Tachycardia, unspecified: Secondary | ICD-10-CM

## 2010-10-11 LAB — AFB CULTURE WITH SMEAR (NOT AT ARMC)
Acid Fast Smear: NONE SEEN
Acid Fast Smear: NONE SEEN

## 2011-02-04 DIAGNOSIS — I509 Heart failure, unspecified: Secondary | ICD-10-CM

## 2011-02-05 DIAGNOSIS — I5020 Unspecified systolic (congestive) heart failure: Secondary | ICD-10-CM

## 2011-02-06 DIAGNOSIS — R079 Chest pain, unspecified: Secondary | ICD-10-CM

## 2011-02-07 DIAGNOSIS — I5020 Unspecified systolic (congestive) heart failure: Secondary | ICD-10-CM

## 2011-02-08 DIAGNOSIS — I5021 Acute systolic (congestive) heart failure: Secondary | ICD-10-CM

## 2011-02-18 ENCOUNTER — Ambulatory Visit (HOSPITAL_COMMUNITY)
Admission: RE | Admit: 2011-02-18 | Discharge: 2011-02-18 | Disposition: A | Discharge status: Home / Self Care | Payer: Medicare PPO | Source: Ambulatory Visit | Attending: Pulmonary Disease | Admitting: Pulmonary Disease

## 2011-02-18 ENCOUNTER — Other Ambulatory Visit (HOSPITAL_COMMUNITY): Payer: Self-pay | Admitting: Pulmonary Disease

## 2011-02-18 DIAGNOSIS — M7989 Other specified soft tissue disorders: Secondary | ICD-10-CM

## 2011-02-18 DIAGNOSIS — E119 Type 2 diabetes mellitus without complications: Secondary | ICD-10-CM | POA: Insufficient documentation

## 2011-04-09 DIAGNOSIS — M869 Osteomyelitis, unspecified: Secondary | ICD-10-CM

## 2011-04-09 HISTORY — DX: Osteomyelitis, unspecified: M86.9

## 2011-06-05 ENCOUNTER — Other Ambulatory Visit (HOSPITAL_COMMUNITY): Payer: Self-pay | Admitting: Pulmonary Disease

## 2011-06-05 ENCOUNTER — Ambulatory Visit (HOSPITAL_COMMUNITY)
Admission: RE | Admit: 2011-06-05 | Discharge: 2011-06-05 | Disposition: A | Discharge status: Home / Self Care | Payer: Medicare PPO | Source: Ambulatory Visit | Attending: Pulmonary Disease | Admitting: Pulmonary Disease

## 2011-06-05 DIAGNOSIS — M869 Osteomyelitis, unspecified: Secondary | ICD-10-CM | POA: Insufficient documentation

## 2011-06-05 DIAGNOSIS — M79609 Pain in unspecified limb: Secondary | ICD-10-CM | POA: Insufficient documentation

## 2011-06-05 DIAGNOSIS — M79606 Pain in leg, unspecified: Secondary | ICD-10-CM

## 2011-06-05 DIAGNOSIS — M87059 Idiopathic aseptic necrosis of unspecified femur: Secondary | ICD-10-CM | POA: Insufficient documentation

## 2011-06-05 DIAGNOSIS — L989 Disorder of the skin and subcutaneous tissue, unspecified: Secondary | ICD-10-CM

## 2011-06-05 LAB — POCT I-STAT, CHEM 8
BUN: 24 mg/dL — ABNORMAL HIGH (ref 6–23)
Chloride: 102 mEq/L (ref 96–112)
Creatinine, Ser: 1.5 mg/dL — ABNORMAL HIGH (ref 0.50–1.35)
Potassium: 4 mEq/L (ref 3.5–5.1)
Sodium: 135 mEq/L (ref 135–145)
TCO2: 27 mmol/L (ref 0–100)

## 2011-06-05 MED ORDER — GADOBENATE DIMEGLUMINE 529 MG/ML IV SOLN
10.0000 mL | Freq: Once | INTRAVENOUS | Status: AC | PRN
  Administered 2011-06-05: 10 mL via INTRAVENOUS

## 2011-06-12 ENCOUNTER — Ambulatory Visit (INDEPENDENT_AMBULATORY_CARE_PROVIDER_SITE_OTHER): Payer: Medicare PPO | Admitting: Infectious Diseases

## 2011-06-12 ENCOUNTER — Other Ambulatory Visit: Payer: Self-pay

## 2011-06-12 ENCOUNTER — Encounter (HOSPITAL_COMMUNITY): Payer: Self-pay | Admitting: *Deleted

## 2011-06-12 ENCOUNTER — Inpatient Hospital Stay (HOSPITAL_COMMUNITY)
Admission: AD | Admit: 2011-06-12 | Discharge: 2011-06-18 | DRG: 478 | Disposition: A | Discharge status: Home / Self Care | Payer: Medicare PPO | Source: Ambulatory Visit | Attending: Internal Medicine | Admitting: Internal Medicine

## 2011-06-12 ENCOUNTER — Encounter (HOSPITAL_COMMUNITY)
Admission: AD | Disposition: A | Discharge status: Home / Self Care | Payer: Self-pay | Source: Ambulatory Visit | Attending: Internal Medicine

## 2011-06-12 ENCOUNTER — Inpatient Hospital Stay (HOSPITAL_COMMUNITY): Payer: Medicare PPO | Admitting: Anesthesiology

## 2011-06-12 ENCOUNTER — Encounter (HOSPITAL_COMMUNITY): Payer: Self-pay | Admitting: Anesthesiology

## 2011-06-12 ENCOUNTER — Encounter: Payer: Self-pay | Admitting: Infectious Diseases

## 2011-06-12 DIAGNOSIS — E785 Hyperlipidemia, unspecified: Secondary | ICD-10-CM | POA: Insufficient documentation

## 2011-06-12 DIAGNOSIS — Z9981 Dependence on supplemental oxygen: Secondary | ICD-10-CM

## 2011-06-12 DIAGNOSIS — L02419 Cutaneous abscess of limb, unspecified: Secondary | ICD-10-CM | POA: Diagnosis present

## 2011-06-12 DIAGNOSIS — Z794 Long term (current) use of insulin: Secondary | ICD-10-CM

## 2011-06-12 DIAGNOSIS — I669 Occlusion and stenosis of unspecified cerebral artery: Secondary | ICD-10-CM | POA: Insufficient documentation

## 2011-06-12 DIAGNOSIS — J4489 Other specified chronic obstructive pulmonary disease: Secondary | ICD-10-CM | POA: Diagnosis present

## 2011-06-12 DIAGNOSIS — I10 Essential (primary) hypertension: Secondary | ICD-10-CM

## 2011-06-12 DIAGNOSIS — Z6841 Body Mass Index (BMI) 40.0 and over, adult: Secondary | ICD-10-CM

## 2011-06-12 DIAGNOSIS — M869 Osteomyelitis, unspecified: Secondary | ICD-10-CM

## 2011-06-12 DIAGNOSIS — Z87891 Personal history of nicotine dependence: Secondary | ICD-10-CM

## 2011-06-12 DIAGNOSIS — M6580 Other synovitis and tenosynovitis, unspecified site: Secondary | ICD-10-CM | POA: Diagnosis present

## 2011-06-12 DIAGNOSIS — Z951 Presence of aortocoronary bypass graft: Secondary | ICD-10-CM

## 2011-06-12 DIAGNOSIS — E1165 Type 2 diabetes mellitus with hyperglycemia: Secondary | ICD-10-CM | POA: Diagnosis present

## 2011-06-12 DIAGNOSIS — E119 Type 2 diabetes mellitus without complications: Secondary | ICD-10-CM | POA: Insufficient documentation

## 2011-06-12 DIAGNOSIS — I739 Peripheral vascular disease, unspecified: Secondary | ICD-10-CM | POA: Insufficient documentation

## 2011-06-12 DIAGNOSIS — N179 Acute kidney failure, unspecified: Secondary | ICD-10-CM | POA: Diagnosis present

## 2011-06-12 DIAGNOSIS — I519 Heart disease, unspecified: Secondary | ICD-10-CM | POA: Diagnosis present

## 2011-06-12 DIAGNOSIS — Z8673 Personal history of transient ischemic attack (TIA), and cerebral infarction without residual deficits: Secondary | ICD-10-CM

## 2011-06-12 DIAGNOSIS — I2510 Atherosclerotic heart disease of native coronary artery without angina pectoris: Secondary | ICD-10-CM | POA: Diagnosis present

## 2011-06-12 DIAGNOSIS — IMO0002 Reserved for concepts with insufficient information to code with codable children: Secondary | ICD-10-CM | POA: Diagnosis present

## 2011-06-12 DIAGNOSIS — J449 Chronic obstructive pulmonary disease, unspecified: Secondary | ICD-10-CM | POA: Diagnosis present

## 2011-06-12 DIAGNOSIS — E669 Obesity, unspecified: Secondary | ICD-10-CM | POA: Insufficient documentation

## 2011-06-12 DIAGNOSIS — L03119 Cellulitis of unspecified part of limb: Secondary | ICD-10-CM | POA: Diagnosis present

## 2011-06-12 DIAGNOSIS — I252 Old myocardial infarction: Secondary | ICD-10-CM

## 2011-06-12 DIAGNOSIS — J628 Pneumoconiosis due to other dust containing silica: Secondary | ICD-10-CM | POA: Insufficient documentation

## 2011-06-12 HISTORY — DX: Unspecified osteoarthritis, unspecified site: M19.90

## 2011-06-12 HISTORY — DX: Anxiety disorder, unspecified: F41.9

## 2011-06-12 HISTORY — DX: Cerebral infarction, unspecified: I63.9

## 2011-06-12 HISTORY — DX: Major depressive disorder, single episode, unspecified: F32.9

## 2011-06-12 HISTORY — PX: I&D EXTREMITY: SHX5045

## 2011-06-12 HISTORY — DX: Depression, unspecified: F32.A

## 2011-06-12 LAB — CBC
HCT: 34.7 % — ABNORMAL LOW (ref 39.0–52.0)
MCHC: 32.9 g/dL (ref 30.0–36.0)
RDW: 14.4 % (ref 11.5–15.5)
WBC: 9.1 10*3/uL (ref 4.0–10.5)

## 2011-06-12 LAB — GLUCOSE, CAPILLARY
Glucose-Capillary: 237 mg/dL — ABNORMAL HIGH (ref 70–99)
Glucose-Capillary: 169 mg/dL — ABNORMAL HIGH (ref 70–99)

## 2011-06-12 LAB — BASIC METABOLIC PANEL
BUN: 18 mg/dL (ref 6–23)
Calcium: 10.2 mg/dL (ref 8.4–10.5)
GFR calc non Af Amer: 64 mL/min — ABNORMAL LOW (ref >90)
Glucose, Bld: 199 mg/dL — ABNORMAL HIGH (ref 70–99)

## 2011-06-12 LAB — PROTIME-INR
INR: 1.07 (ref 0.00–1.49)
Prothrombin Time: 14.1 seconds (ref 11.6–15.2)

## 2011-06-12 LAB — TYPE AND SCREEN

## 2011-06-12 LAB — GRAM STAIN

## 2011-06-12 LAB — HEMOGLOBIN A1C: Hgb A1c MFr Bld: 9.4 % — ABNORMAL HIGH (ref <5.7)

## 2011-06-12 SURGERY — IRRIGATION AND DEBRIDEMENT EXTREMITY
Anesthesia: General | Site: Leg Lower | Laterality: Right | Wound class: Dirty or Infected

## 2011-06-12 MED ORDER — VANCOMYCIN HCL IN DEXTROSE 1-5 GM/200ML-% IV SOLN
INTRAVENOUS | Status: AC
  Filled 2011-06-12: qty 200

## 2011-06-12 MED ORDER — DIPYRIDAMOLE 75 MG PO TABS: 75.0000 mg | ORAL_TABLET | Freq: Three times a day (TID) | ORAL | Status: DC

## 2011-06-12 MED ORDER — LIDOCAINE HCL (CARDIAC) 20 MG/ML IV SOLN
INTRAVENOUS | Status: DC | PRN
  Administered 2011-06-12: 60 mg via INTRAVENOUS

## 2011-06-12 MED ORDER — FENTANYL CITRATE 0.05 MG/ML IJ SOLN
INTRAMUSCULAR | Status: DC | PRN
  Administered 2011-06-12: 100 ug via INTRAVENOUS

## 2011-06-12 MED ORDER — CHLORHEXIDINE GLUCONATE 4 % EX LIQD
60.0000 mL | Freq: Once | CUTANEOUS | Status: DC
  Filled 2011-06-12: qty 60

## 2011-06-12 MED ORDER — SODIUM CHLORIDE 0.9 % IR SOLN
Status: DC | PRN
  Administered 2011-06-12: 1000 mL

## 2011-06-12 MED ORDER — SODIUM CHLORIDE 0.9 % IV SOLN
2500.0000 mg | Freq: Once | INTRAVENOUS | Status: DC
  Filled 2011-06-12: qty 2500

## 2011-06-12 MED ORDER — ISOSORBIDE MONONITRATE ER 30 MG PO TB24
30.0000 mg | ORAL_TABLET | Freq: Every day | ORAL | Status: DC
  Administered 2011-06-13 – 2011-06-14 (×2): 30 mg via ORAL
  Filled 2011-06-12 (×2): qty 1

## 2011-06-12 MED ORDER — LISINOPRIL 5 MG PO TABS
5.0000 mg | ORAL_TABLET | Freq: Every day | ORAL | Status: DC
  Administered 2011-06-13 – 2011-06-14 (×2): 5 mg via ORAL
  Filled 2011-06-12 (×3): qty 1

## 2011-06-12 MED ORDER — VANCOMYCIN HCL 1000 MG IV SOLR
1000.0000 mg | INTRAVENOUS | Status: DC | PRN
  Administered 2011-06-12: 1000 mg via INTRAVENOUS

## 2011-06-12 MED ORDER — INSULIN NPH (HUMAN) (ISOPHANE) 100 UNIT/ML ~~LOC~~ SUSP
110.0000 [IU] | Freq: Two times a day (BID) | SUBCUTANEOUS | Status: DC
  Administered 2011-06-13 – 2011-06-18 (×6): 110 [IU] via SUBCUTANEOUS
  Filled 2011-06-12: qty 10

## 2011-06-12 MED ORDER — VANCOMYCIN HCL 1000 MG IV SOLR
1500.0000 mg | Freq: Two times a day (BID) | INTRAVENOUS | Status: DC
  Administered 2011-06-13 – 2011-06-17 (×9): 1500 mg via INTRAVENOUS
  Filled 2011-06-12 (×11): qty 1500

## 2011-06-12 MED ORDER — PHENYLEPHRINE HCL 10 MG/ML IJ SOLN
INTRAMUSCULAR | Status: DC | PRN
  Administered 2011-06-12: 80 ug via INTRAVENOUS

## 2011-06-12 MED ORDER — ENOXAPARIN SODIUM 40 MG/0.4ML ~~LOC~~ SOLN
40.0000 mg | SUBCUTANEOUS | Status: DC
  Filled 2011-06-12: qty 0.4

## 2011-06-12 MED ORDER — MORPHINE SULFATE 2 MG/ML IJ SOLN
1.0000 mg | INTRAMUSCULAR | Status: DC | PRN
  Administered 2011-06-13 (×4): 1 mg via INTRAVENOUS
  Filled 2011-06-12 (×4): qty 1

## 2011-06-12 MED ORDER — HYDROMORPHONE HCL PF 1 MG/ML IJ SOLN
INTRAMUSCULAR | Status: AC
  Filled 2011-06-12: qty 1

## 2011-06-12 MED ORDER — ATORVASTATIN CALCIUM 40 MG PO TABS
40.0000 mg | ORAL_TABLET | Freq: Every day | ORAL | Status: DC
  Administered 2011-06-13 – 2011-06-18 (×6): 40 mg via ORAL
  Filled 2011-06-12 (×7): qty 1

## 2011-06-12 MED ORDER — FUROSEMIDE 80 MG PO TABS
80.0000 mg | ORAL_TABLET | Freq: Three times a day (TID) | ORAL | Status: DC
  Administered 2011-06-13 – 2011-06-14 (×4): 80 mg via ORAL
  Filled 2011-06-12 (×7): qty 1

## 2011-06-12 MED ORDER — PROPRANOLOL HCL 1 MG/ML IV SOLN: 0.2500 mg | INTRAVENOUS | Status: DC | PRN

## 2011-06-12 MED ORDER — PROPOFOL 10 MG/ML IV EMUL
INTRAVENOUS | Status: DC | PRN
  Administered 2011-06-12: 200 mg via INTRAVENOUS

## 2011-06-12 MED ORDER — ONDANSETRON HCL 4 MG/2ML IJ SOLN
INTRAMUSCULAR | Status: DC | PRN
  Administered 2011-06-12: 4 mg via INTRAVENOUS

## 2011-06-12 MED ORDER — EZETIMIBE 10 MG PO TABS
10.0000 mg | ORAL_TABLET | Freq: Every day | ORAL | Status: DC
  Administered 2011-06-13 – 2011-06-18 (×6): 10 mg via ORAL
  Filled 2011-06-12 (×6): qty 1

## 2011-06-12 MED ORDER — PROPRANOLOL HCL 1 MG/ML IV SOLN
0.2500 mg | INTRAVENOUS | Status: DC | PRN
  Administered 2011-06-12 (×2): 0.3 mg via INTRAVENOUS

## 2011-06-12 MED ORDER — ASPIRIN 325 MG PO TABS
325.0000 mg | ORAL_TABLET | Freq: Every day | ORAL | Status: DC
  Administered 2011-06-13 – 2011-06-18 (×6): 325 mg via ORAL
  Filled 2011-06-12 (×6): qty 1

## 2011-06-12 MED ORDER — ENOXAPARIN SODIUM 30 MG/0.3ML ~~LOC~~ SOLN: 30.0000 mg | SUBCUTANEOUS | Status: DC

## 2011-06-12 MED ORDER — LACTATED RINGERS IV SOLN
INTRAVENOUS | Status: DC | PRN
  Administered 2011-06-12: 21:00:00 via INTRAVENOUS

## 2011-06-12 MED ORDER — PROPRANOLOL HCL 1 MG/ML IV SOLN
INTRAVENOUS | Status: AC
  Administered 2011-06-12: 0.3 mg via INTRAVENOUS
  Filled 2011-06-12: qty 1

## 2011-06-12 MED ORDER — HYDROMORPHONE HCL PF 1 MG/ML IJ SOLN
0.2500 mg | INTRAMUSCULAR | Status: DC | PRN
  Administered 2011-06-12: 0.5 mg via INTRAVENOUS
  Administered 2011-06-12: 0.25 mg via INTRAVENOUS
  Administered 2011-06-12: 0.5 mg via INTRAVENOUS

## 2011-06-12 MED ORDER — METOPROLOL SUCCINATE ER 100 MG PO TB24
200.0000 mg | ORAL_TABLET | Freq: Two times a day (BID) | ORAL | Status: DC
  Administered 2011-06-13 – 2011-06-14 (×3): 200 mg via ORAL
  Filled 2011-06-12 (×5): qty 2

## 2011-06-12 MED ORDER — PIPERACILLIN-TAZOBACTAM 3.375 G IVPB
3.3750 g | Freq: Three times a day (TID) | INTRAVENOUS | Status: DC
  Administered 2011-06-13 – 2011-06-15 (×8): 3.375 g via INTRAVENOUS
  Filled 2011-06-12 (×10): qty 50

## 2011-06-12 MED ORDER — ONDANSETRON HCL 4 MG/2ML IJ SOLN: 4.0000 mg | Freq: Once | INTRAMUSCULAR | Status: AC | PRN

## 2011-06-12 MED ORDER — OXYCODONE-ACETAMINOPHEN 5-325 MG PO TABS
1.0000 | ORAL_TABLET | ORAL | Status: DC | PRN
  Administered 2011-06-12 – 2011-06-15 (×8): 2 via ORAL
  Administered 2011-06-16 – 2011-06-18 (×7): 1 via ORAL
  Filled 2011-06-12: qty 1
  Filled 2011-06-12 (×3): qty 2
  Filled 2011-06-12: qty 1
  Filled 2011-06-12 (×2): qty 2
  Filled 2011-06-12 (×2): qty 1
  Filled 2011-06-12 (×2): qty 2
  Filled 2011-06-12 (×2): qty 1
  Filled 2011-06-12 (×4): qty 2

## 2011-06-12 SURGICAL SUPPLY — 45 items
BANDAGE ELASTIC 4 VELCRO ST LF (GAUZE/BANDAGES/DRESSINGS) ×2 IMPLANT
BANDAGE GAUZE ELAST BULKY 4 IN (GAUZE/BANDAGES/DRESSINGS) ×2 IMPLANT
BNDG COHESIVE 4X5 TAN STRL (GAUZE/BANDAGES/DRESSINGS) ×2 IMPLANT
CLOTH BEACON ORANGE TIMEOUT ST (SAFETY) ×2 IMPLANT
CUFF TOURNIQUET SINGLE 18IN (TOURNIQUET CUFF) IMPLANT
CUFF TOURNIQUET SINGLE 24IN (TOURNIQUET CUFF) IMPLANT
CUFF TOURNIQUET SINGLE 34IN LL (TOURNIQUET CUFF) IMPLANT
CUFF TOURNIQUET SINGLE 44IN (TOURNIQUET CUFF) IMPLANT
DRSG ADAPTIC 3X8 NADH LF (GAUZE/BANDAGES/DRESSINGS) ×2 IMPLANT
DRSG PAD ABDOMINAL 8X10 ST (GAUZE/BANDAGES/DRESSINGS) ×4 IMPLANT
ELECT REM PT RETURN 9FT ADLT (ELECTROSURGICAL)
ELECTRODE REM PT RTRN 9FT ADLT (ELECTROSURGICAL) IMPLANT
EVACUATOR 1/8 PVC DRAIN (DRAIN) ×2 IMPLANT
FACESHIELD LNG OPTICON STERILE (SAFETY) ×2 IMPLANT
GAUZE SPONGE 4X4 12PLY STRL LF (GAUZE/BANDAGES/DRESSINGS) ×2 IMPLANT
GLOVE BIO SURGEON STRL SZ 6.5 (GLOVE) ×2 IMPLANT
GLOVE BIOGEL PI IND STRL 6.5 (GLOVE) ×1 IMPLANT
GLOVE BIOGEL PI INDICATOR 6.5 (GLOVE) ×1
GLOVE SURG SS PI 8.0 STRL IVOR (GLOVE) ×4 IMPLANT
GOWN EXTRA PROTECTION XL (GOWNS) ×2 IMPLANT
GOWN STRL NON-REIN LRG LVL3 (GOWN DISPOSABLE) ×6 IMPLANT
HANDPIECE INTERPULSE COAX TIP (DISPOSABLE)
KIT BASIN OR (CUSTOM PROCEDURE TRAY) ×2 IMPLANT
KIT ROOM TURNOVER OR (KITS) ×2 IMPLANT
MANIFOLD NEPTUNE II (INSTRUMENTS) ×2 IMPLANT
NS IRRIG 1000ML POUR BTL (IV SOLUTION) ×2 IMPLANT
PACK ORTHO EXTREMITY (CUSTOM PROCEDURE TRAY) ×2 IMPLANT
PAD ARMBOARD 7.5X6 YLW CONV (MISCELLANEOUS) ×4 IMPLANT
SET HNDPC FAN SPRY TIP SCT (DISPOSABLE) IMPLANT
SPONGE GAUZE 4X4 12PLY (GAUZE/BANDAGES/DRESSINGS) ×2 IMPLANT
SPONGE LAP 18X18 X RAY DECT (DISPOSABLE) ×2 IMPLANT
SPONGE LAP 4X18 X RAY DECT (DISPOSABLE) IMPLANT
STAPLER VISISTAT (STAPLE) ×2 IMPLANT
STAPLER VISISTAT 35W (STAPLE) ×2 IMPLANT
STOCKINETTE IMPERVIOUS 9X36 MD (GAUZE/BANDAGES/DRESSINGS) ×2 IMPLANT
SUT ETHILON 2 0 FS 18 (SUTURE) ×2 IMPLANT
SUT VIC AB 2-0 CT1 27 (SUTURE) ×1
SUT VIC AB 2-0 CT1 TAPERPNT 27 (SUTURE) ×1 IMPLANT
TOWEL OR 17X24 6PK STRL BLUE (TOWEL DISPOSABLE) ×2 IMPLANT
TOWEL OR 17X26 10 PK STRL BLUE (TOWEL DISPOSABLE) ×2 IMPLANT
TUBE ANAEROBIC SPECIMEN COL (MISCELLANEOUS) IMPLANT
TUBE CONNECTING 12X1/4 (SUCTIONS) ×2 IMPLANT
UNDERPAD 30X30 INCONTINENT (UNDERPADS AND DIAPERS) ×2 IMPLANT
WATER STERILE IRR 1000ML POUR (IV SOLUTION) ×2 IMPLANT
YANKAUER SUCT BULB TIP NO VENT (SUCTIONS) ×2 IMPLANT

## 2011-06-12 NOTE — Op Note (Signed)
Preop osteo right tibia Postop same Proc I&D right tibia [place] Belay GET Culture Bone and fluid EBL min Cond satisfactory to PACU

## 2011-06-12 NOTE — Anesthesia Preprocedure Evaluation (Signed)
Anesthesia Evaluation  Patient identified by MRN, date of birth, ID band Patient awake    Reviewed: Allergy & Precautions, H&P , NPO status , Patient's Chart, lab work & pertinent test results  Airway Mallampati: II TM Distance: >3 FB Neck ROM: full    Dental   Pulmonary shortness of breath, asthma , pneumonia , COPDformer smoker         Cardiovascular Exercise Tolerance: Poor hypertension, + angina + CAD, + Past MI, + Cardiac Stents, + CABG, + Peripheral Vascular Disease and + DOE Rhythm:regular Rate:Normal     Neuro/Psych  Headaches, CVA, Residual Symptoms    GI/Hepatic   Endo/Other  Diabetes mellitus-, Type 2, Insulin Dependent  Renal/GU      Musculoskeletal   Abdominal   Peds  Hematology   Anesthesia Other Findings   Reproductive/Obstetrics                           Anesthesia Physical Anesthesia Plan  ASA: III  Anesthesia Plan: General   Post-op Pain Management:    Induction: Intravenous  Airway Management Planned: LMA  Additional Equipment:   Intra-op Plan:   Post-operative Plan: Extubation in OR  Informed Consent: I have reviewed the patients History and Physical, chart, labs and discussed the procedure including the risks, benefits and alternatives for the proposed anesthesia with the patient or authorized representative who has indicated his/her understanding and acceptance.     Plan Discussed with: CRNA, Anesthesiologist and Surgeon  Anesthesia Plan Comments:         Anesthesia Quick Evaluation

## 2011-06-12 NOTE — Assessment & Plan Note (Addendum)
Pt has osteo and bone infarcts, abscess. I discussed options with him and he stated that he would like to have this taken care of as quickly as possible. He states that he has thought about "taking a bunch of pills or putting a gun to his head' due to the pain. Given this, will admit him, have ortho eval him for biopsy/aspiration. He will need a PIC and can be started on anbx after has had a diagnostic procedure.  I have discussed this hospitalist service, placed a consult to orthopedics [facility]).

## 2011-06-12 NOTE — Transfer of Care (Signed)
Immediate Anesthesia Transfer of Care Note  Patient: Timothy Trujillo  Procedure(s) Performed: Procedure(s) (LRB): IRRIGATION AND DEBRIDEMENT EXTREMITY (Right)  Patient Location: PACU  Anesthesia Type: General  Level of Consciousness: awake and patient cooperative  Airway & Oxygen Therapy: Patient Spontanous Breathing and Patient connected to nasal cannula oxygen  Post-op Assessment: Report given to PACU RN  Post vital signs: Reviewed and stable  Complications: No apparent anesthesia complications

## 2011-06-12 NOTE — Anesthesia Procedure Notes (Signed)
Procedure Name: LMA Insertion Date/Time: 06/12/2011 9:11 PM Performed by: Glendora Score Pre-anesthesia Checklist: Patient identified, Emergency Drugs available, Suction available and Patient being monitored Patient Re-evaluated:Patient Re-evaluated prior to inductionOxygen Delivery Method: Circle system utilized Preoxygenation: Pre-oxygenation with 100% oxygen Intubation Type: IV induction LMA: LMA with gastric port inserted LMA Size: 5.0 Number of attempts: 1 Placement Confirmation: positive ETCO2 and breath sounds checked- equal and bilateral Tube secured with: Tape Dental Injury: Teeth and Oropharynx as per pre-operative assessment

## 2011-06-12 NOTE — Progress Notes (Signed)
ANTIBIOTIC CONSULT NOTE - INITIAL  Pharmacy Consult for Vancomycin and zosyn Indication: osteomyelitis  Allergies  Allergen Reactions  . Stadol (Butorphanol Tartrate) Other (See Comments)    "makes him crazy"    Patient Measurements: Height: 6\' 3"  (190.5 cm) Weight: 329 lb (149.233 kg) IBW/kg (Calculated) : 84.5   Vital Signs: Temp: 97.4 F (36.3 C) (03/06 1728) Temp src: Oral (03/06 1728) BP: 142/90 mmHg (03/06 1728) Pulse Rate: 110  (03/06 1728) Intake/Output from previous day:   Intake/Output from this shift:    Labs:  [facility] 06/12/11 1808  WBC 9.1  HGB 11.4*  PLT 350  LABCREA --  CREATININE --   Estimated Creatinine Clearance: 79.7 ml/min (by C-G formula based on Cr of 1.5). No results found for this basename: VANCOTROUGH:2,VANCOPEAK:2,VANCORANDOM:2,GENTTROUGH:2,GENTPEAK:2,GENTRANDOM:2,TOBRATROUGH:2,TOBRAPEAK:2,TOBRARND:2,AMIKACINPEAK:2,AMIKACINTROU:2,AMIKACIN:2, in the last 72 hours   Microbiology: No results found for this or any previous visit (from the past 720 hour(s)).  Medical History: Past Medical History  Diagnosis Date  . Diabetes mellitus   . Cerebral artery occlusion   . Heart disease   . Obesity   . Hyperlipidemia   . Peripheral vascular disease   . Hypertension   . Shortness of breath   . Angina   . Asthma   . Myocardial infarction   . Coronary artery disease   . Stroke   . Headache   . Arthritis   . Anxiety   . COPD (chronic obstructive pulmonary disease)   . Pneumonia   . Depression     Medications:  Scheduled:    . aspirin  325 mg Oral Daily  . atorvastatin  40 mg Oral q1800  . chlorhexidine  60 mL Topical Once  . dipyridamole  75 mg Oral TID  . enoxaparin  40 mg Subcutaneous Q24H  . ezetimibe  10 mg Oral Daily  . furosemide  80 mg Oral TID  . insulin NPH  110 Units Subcutaneous BID  . isosorbide mononitrate  30 mg Oral Daily  . lisinopril  5 mg Oral Daily  . metoprolol  200 mg Oral BID  . 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 Pharmacy is consulted to start vancomycin and zosyn for empiric coverage. Scr 1.5 (on 2/27), est. GFR ~ 80 ml/min. no new scr for this admission yet.   Goal of Therapy:  Vancomycin trough level 15-20 mcg/ml  Plan:  - Vancomycin 2500 mg IV x 1 then 1500 mg IV Q12hrs (obesity) - obtain BMET in AM, adjust dose if needed base on renal function - check vancomycin trough after 4 - 5 doses - zosyn 3.375 g IV q8hrs - f/u cultures and clinical course  Riki Rusk 06/12/2011,7:05 PM

## 2011-06-12 NOTE — Progress Notes (Signed)
  Subjective:    Patient ID: Timothy Trujillo, male    DOB: [birth year], 61 y.o.   MRN: [id number]  HPI 61 yo M with hx of DM2, CAD, and HTN. Since February 2012 has had pain in his R knee. He states he was told that he had arthritis. Had u/s last month that was negative for DVT. Pain increased to the point he was unable to stand. Was seen in ED and had plain films and was told he had "dead bone". 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 Sequestrum is present with anterior medial tibial sinus tract and small medial tibial subperiosteal and soft tissue abscess. Difficult to evaluate in the setting of presumed pre-existing bone infarction. He had BCx, routine blood work done.  Has had increased heat in his R leg, swelling, erythematous.No injuries to leg or foot. Had cartilage taken out of same knee 30 years ago.  FSGs "still a little bit high" >200  Review of Systems  Constitutional: Positive for fever and chills.  Respiratory: Positive for shortness of breath.        Believes that he has lung disease from allergy silicone from grout he used in flooring.  His SPO2 drops rapidly with any kind of exertion.   Gastrointestinal: Negative for diarrhea and constipation.  Genitourinary: Negative for dysuria.       Objective:   Physical Exam  Constitutional: He appears well-developed and well-nourished. He appears distressed.  HENT:  Mouth/Throat: No oropharyngeal exudate.    Eyes: EOM are normal. Pupils are equal, round, and reactive to light.  Neck: Neck supple.  Cardiovascular: Normal rate, regular rhythm and normal heart sounds.   Pulmonary/Chest: Accessory muscle usage present. Tachypnea noted. He has decreased breath sounds.  Abdominal: Soft. Bowel sounds are normal. There is no tenderness.  Musculoskeletal:       Legs: Lymphadenopathy:    He has no cervical adenopathy.          Assessment &  Plan:

## 2011-06-12 NOTE — Preoperative (Signed)
Beta Blockers   Reason not to administer Beta Blockers:Not Applicable 

## 2011-06-12 NOTE — H&P (Signed)
PCP:  Fredirick Maudlin, MD, MD   DOA:  06/12/2011  3:30 PM  Chief Complaint:  Swelling of the right knee area  HPI: Pt is 61 yo male with chronic lung disease, oxygen dependent who was directly admitted after being seen by Dr. Ninetta Lights ID specialist for further evaluation of the right knee swelling that has been getting progressively worse over the past 3 weeks. 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 Pt was told he will need bone biopsy. Pt reports subjective fevers, chills, fatigue and poor oral intake. 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 He denies headaches, visual changes, no abdominal or urinary concerns.   Allergies: Allergies  Allergen Reactions  . Stadol (Butorphanol Tartrate) Other (See Comments)    "makes him crazy"    Prior to Admission medications   Medication Sig Start Date End Date Taking? Authorizing Provider  Ascorbic Acid (VITAMIN C PO) Take 1 tablet by mouth daily.   Yes Historical Provider, MD  aspirin 325 MG tablet Take 325 mg by mouth daily.   Yes Historical Provider, MD  Cyanocobalamin (VITAMIN B 12 PO) Take 1 tablet by mouth daily.   Yes Historical Provider, MD  ezetimibe (ZETIA) 10 MG tablet Take 10 mg by mouth daily.   Yes Historical Provider, MD  furosemide (LASIX) 80 MG tablet Take 80 mg by mouth 3 (three) times daily.   Yes Historical Provider, MD  insulin NPH (HUMULIN N,NOVOLIN N) 100 UNIT/ML injection Inject 110 Units into the skin 2 (two) times daily.   Yes Historical Provider, MD  isosorbide mononitrate (IMDUR) 30 MG 24 hr tablet Take 30 mg by mouth daily.   Yes Historical Provider, MD  metoprolol (TOPROL-XL) 200 MG 24 hr tablet Take 200 mg by mouth 2 (two) times daily.   Yes Historical Provider, MD  niacin (NIASPAN) 1000 MG CR tablet Take 1,000 mg by mouth at bedtime.   Yes  Historical Provider, MD  oxyCODONE-acetaminophen (PERCOCET) 5-325 MG per tablet Take 1 tablet by mouth 4 (four) times daily.   Yes Historical Provider, MD  simvastatin (ZOCOR) 80 MG tablet Take 80 mg by mouth daily.   Yes Historical Provider, MD  VITAMIN E PO Take 1 tablet by mouth daily.   Yes Historical Provider, MD  nitroGLYCERIN (NITROSTAT) 0.4 MG SL tablet Place 0.4 mg under the tongue every 5 (five) minutes as needed.    Historical Provider, MD    Past Medical History  Diagnosis Date  . Diabetes mellitus   . Cerebral artery occlusion   . Heart disease   . Obesity   . Hyperlipidemia   . Peripheral vascular disease   . Hypertension   . Shortness of breath   . Angina   . Asthma   . Myocardial infarction   . Coronary artery disease   . Stroke   . Headache   . Arthritis   . Anxiety   . COPD (chronic obstructive pulmonary disease)   . Pneumonia   . Depression     Past Surgical History  Procedure Date  . Coronary artery bypass graft   . Coronary stent placement 2008    CABG grafts closed  . Joint replacement   . Cholecystectomy   . Eye surgery     Social History:  reports that he quit smoking about 28 years ago. He has never used  smokeless tobacco. He reports that he does not drink alcohol or use illicit drugs.  Family History  Problem Relation Age of Onset  . Dementia Mother   . Arthritis Mother   . Heart disease Father   . Heart disease Brother     Review of Systems:  Constitutional: Denies diaphoresis HEENT: Denies photophobia, eye pain, redness, hearing loss, ear pain, congestion, sore throat, rhinorrhea, sneezing, mouth sores, trouble swallowing, neck pain, neck stiffness and tinnitus.   Respiratory: Denies chest tightness,  and wheezing.   Cardiovascular: Denies chest pain, palpitations Gastrointestinal: Denies abdominal pain, diarrhea, constipation, blood in stool and abdominal distention.  Genitourinary: Denies dysuria, urgency, frequency, hematuria,  flank pain and difficulty urinating.  Musculoskeletal: Denies myalgias, back pain, joint swelling, arthralgias Skin: Denies pallor  Neurological: Denies dizziness, seizures, syncope, weakness, light-headedness, numbness and headaches.  Hematological: Denies adenopathy. Easy bruising, personal or family bleeding history  Psychiatric/Behavioral: Denies suicidal ideation, mood changes, confusion, nervousness, sleep disturbance and agitation  Physical Exam:  Filed Vitals:   06/12/11 1728  BP: 142/90  Pulse: 110  Temp: 97.4 F (36.3 C)  TempSrc: Oral  Resp: 18  Height: 6\' 3"  (1.905 m)  Weight: 149.233 kg (329 lb)  SpO2: 97%    Constitutional: Vital signs reviewed.  Patient is a well-developed and well-nourished in no acute distress and cooperative with exam. Alert and oriented x3.  Head: Normocephalic and atraumatic Ear: TM normal bilaterally Mouth: no erythema or exudates, MMM Eyes: PERRL, EOMI, conjunctivae normal, No scleral icterus.  Neck: Supple, Trachea midline normal ROM, No JVD, mass, thyromegaly, or carotid bruit present.  Cardiovascular: RRR, S1 normal, S2 normal, no MRG, pulses symmetric and intact bilaterally Pulmonary/Chest: CTAB, no wheezes, rales, or rhonchi Abdominal: Soft. Non-tender, non-distended, bowel sounds are normal, no masses, organomegaly, or guarding present.  GU: no CVA tenderness Musculoskeletal: No joint deformities, erythema, or stiffness, ROM full and no nontender Ext: swelling in the right knee area on the medial aspect, chronic venous stasis changes bilaterally, tenderness to palpation right lower extremity, pulses are present and equal bilaterally, DP and PT Hematology: no cervical, inginal, or axillary adenopathy.  Neurological: A&O x3, Strenght is normal and symmetric bilaterally, cranial nerve II-XII are grossly intact, no focal motor deficit, sensory intact to light touch bilaterally.  Skin: Warm, dry and intact. No rash, cyanosis, or clubbing.    Psychiatric: Normal mood and affect. speech and behavior is normal. Judgment and thought content normal. Cognition and memory are normal.   Labs on Admission:  Results for orders placed during the hospital encounter of 06/12/11 (from the past 48 hour(s))  GLUCOSE, CAPILLARY     Status: Abnormal   Collection Time   06/12/11  4:32 PM      Component Value Range Comment   Glucose-Capillary 237 (*) 70 - 99 (mg/dL)    Comment 1 Notify RN       Radiological Exams on Admission: 06/04/2011 - MRI of the right lower extremity: IMPRESSION:  1. Proximal right tibial bone infarct with superimposed osteomyelitis. Sequestrum is present with anterior medial tibial sinus tract and small medial tibial subperiosteal and soft tissue abscess. The extent of osteomyelitis is difficult to evaluate in the setting of presumed preexisting bone infarction.  2. Typical appearing bone infarcts in the distal femur bilaterally and left tibial diaphysis.  3. Findings were discussed with Dr. Juanetta Gosling at 1310 hours on the day of examination.  Assessment/Plan Principal Problem:  *Osteomyelitis of knee region - please note the above finding on  MRI - pt will need further evaluation by biopsy which is scheduled for this evening - will need to obtain cultures and will follow up on results - will also follow up on ID recommendations - obtain CBC, CMET today and in AM  Active Problems:  Peripheral vascular disease - chronic   Heart disease - no recent 2 D ECHO on file - will continue home medications once done with biopsy - will hold aspirin and lovenox   Obesity - BMI > 40   Diabetes mellitus - uncontrolled - will continue home medication regimen - obtain A1C   Hypertension - will continue home medication regimen   Hyperlipidemia - stable  - plan of care and diagnosis, diagnostic studies and test results were discussed with pt and family at bedside - pt and  family verbalized understanding  Time Spent on  Admission: Over 30  minutes  MAGICK-Laverna Dossett 06/12/2011, 6:27 PM  [facility] [phone]

## 2011-06-12 NOTE — Anesthesia Postprocedure Evaluation (Signed)
  Anesthesia Post-op Note  Patient: PLUMMER MATICH  Procedure(s) Performed: Procedure(s) (LRB): IRRIGATION AND DEBRIDEMENT EXTREMITY (Right)  Patient Location: PACU  Anesthesia Type: General  Level of Consciousness: awake, alert , oriented and patient cooperative  Airway and Oxygen Therapy: Patient Spontanous Breathing  Post-op Pain: mild  Post-op Assessment: Post-op Vital signs reviewed, Patient's Cardiovascular Status Stable, Respiratory Function Stable, Patent Airway, No signs of Nausea or vomiting and Pain level controlled  Post-op Vital Signs: stable  Complications: No apparent anesthesia complications

## 2011-06-12 NOTE — Consult Note (Signed)
Reason for Consult: Osteomyelitis left tibia Referring Physician: Aundre Hietala is an 61 y.o. male.  HPI: Seen by Dr. Ninetta Lights. MRI osteo left tibia. Admitted for IV Ab.  Past Medical History  Diagnosis Date  . Diabetes mellitus   . Cerebral artery occlusion   . Heart disease   . Obesity   . Hyperlipidemia   . Peripheral vascular disease   . Hypertension   . Shortness of breath   . Angina   . Asthma   . Myocardial infarction   . Coronary artery disease   . Stroke   . Headache   . Arthritis   . Anxiety   . COPD (chronic obstructive pulmonary disease)   . Pneumonia   . Depression     Past Surgical History  Procedure Date  . Coronary artery bypass graft   . Coronary stent placement 2008    CABG grafts closed  . Joint replacement   . Cholecystectomy   . Eye surgery     Family History  Problem Relation Age of Onset  . Dementia Mother   . Arthritis Mother   . Heart disease Father   . Heart disease Brother     Social History:  reports that he quit smoking about 28 years ago. He has never used smokeless tobacco. He reports that he does not drink alcohol or use illicit drugs.  Allergies:  Allergies  Allergen Reactions  . Stadol (Butorphanol Tartrate)     Medications: I have reviewed the patient's current medications.  No results found for this or any previous visit (from the past 48 hour(s)).  No results found.  Review of Systems  Constitutional: Positive for malaise/fatigue.  HENT: Negative.   Respiratory: Positive for shortness of breath.   Cardiovascular: Positive for orthopnea.  Gastrointestinal: Negative.   Genitourinary: Negative.   Musculoskeletal: Positive for joint pain.  Skin: Negative.   Neurological: Positive for sensory change.  Endo/Heme/Allergies: Negative.   Psychiatric/Behavioral: Negative.    There were no vitals taken for this visit. Physical Exam  Constitutional: He is oriented to person, place, and time. He appears  well-developed.  HENT:  Head: Normocephalic.  Eyes: Pupils are equal, round, and reactive to light.  Neck: Normal range of motion.  Cardiovascular: Normal rate.   Respiratory: Effort normal.  GI: Soft.  Musculoskeletal: He exhibits tenderness.       Pain proximal tibia. No erythema. Compts soft. -DVT. 1+ DP. Good cap refill. No knee effusion. No joint tenderness. ROM knee normal. Tender proximal tibia. No SQ abscess.  Neurological: He is alert and oriented to person, place, and time.  Skin: Skin is dry. There is erythema.  XRAY: Infarct proximal tibia. MRI: Osteo and infarct proximal tibia. Possible sequestrum ant med tibia.  Assessment/Plan: Osteomyelitis left proximal tibia with possible sequestrum anterior medial tibia with sinus tract. Plan biopsy, cultures and I&D. Patient not septic. Will need preop clearance prior to OR. Discussed with patient risks and benefits  Fae Blossom C 06/12/2011, 4:31 PM

## 2011-06-13 LAB — CBC
Hemoglobin: 10.5 g/dL — ABNORMAL LOW (ref 13.0–17.0)
MCH: 26.6 pg (ref 26.0–34.0)
MCV: 84 fL (ref 78.0–100.0)
Platelets: 288 10*3/uL (ref 150–400)
RBC: 3.94 MIL/uL — ABNORMAL LOW (ref 4.22–5.81)

## 2011-06-13 LAB — BASIC METABOLIC PANEL
BUN: 18 mg/dL (ref 6–23)
CO2: 26 mEq/L (ref 19–32)
Calcium: 8.9 mg/dL (ref 8.4–10.5)
Creatinine, Ser: 1.35 mg/dL (ref 0.50–1.35)
Glucose, Bld: 170 mg/dL — ABNORMAL HIGH (ref 70–99)

## 2011-06-13 LAB — URINALYSIS, ROUTINE W REFLEX MICROSCOPIC
Ketones, ur: NEGATIVE mg/dL
Leukocytes, UA: NEGATIVE
Nitrite: NEGATIVE
Protein, ur: 100 mg/dL — AB
Urobilinogen, UA: 1 mg/dL (ref 0.0–1.0)
pH: 5.5 (ref 5.0–8.0)

## 2011-06-13 LAB — URINE MICROSCOPIC-ADD ON

## 2011-06-13 LAB — GLUCOSE, CAPILLARY: Glucose-Capillary: 164 mg/dL — ABNORMAL HIGH (ref 70–99)

## 2011-06-13 MED ORDER — ENOXAPARIN SODIUM 80 MG/0.8ML ~~LOC~~ SOLN
0.5000 mg/kg | SUBCUTANEOUS | Status: DC
  Administered 2011-06-13 – 2011-06-17 (×5): 75 mg via SUBCUTANEOUS
  Filled 2011-06-13 (×6): qty 0.8

## 2011-06-13 MED ORDER — ENOXAPARIN SODIUM 40 MG/0.4ML ~~LOC~~ SOLN
40.0000 mg | SUBCUTANEOUS | Status: DC
  Filled 2011-06-13: qty 0.4

## 2011-06-13 NOTE — Progress Notes (Signed)
06/13/2011 [facility] Case Management Note [phone]  Utilization review completed.  

## 2011-06-13 NOTE — Progress Notes (Signed)
Subjective: 1 Day Post-Op Procedure(s) (LRB): IRRIGATION AND DEBRIDEMENT EXTREMITY (Right) Patient reports pain as moderate.   Denies CP or SOB.  Objective: Vital signs in last 24 hours: Temp:  [97.4 F (36.3 C)-100 F (37.8 C)] 98.3 F (36.8 C) (03/07 1241) Pulse Rate:  [101-139] 106  (03/07 1241) Resp:  [16-25] 18  (03/07 1241) BP: (109-156)/(64-98) 128/78 mmHg (03/07 1241) SpO2:  [84 %-99 %] 94 % (03/07 1241) Weight:  [149.233 kg (329 lb)] 149.233 kg (329 lb) (03/06 1728)  Intake/Output from previous day: 03/06 0701 - 03/07 0700 In: 1150 [I.V.:1100; IV Piggyback:50] Out: 210 [Urine:200; Drains:10] Intake/Output this shift: Total I/O In: -  Out: 500 [Urine:400; Drains:100]   Basename 06/13/11 0605 06/12/11 1808  HGB 10.5* 11.4*    Basename 06/13/11 0605 06/12/11 1808  WBC 8.4 9.1  RBC 3.94* 4.20*  HCT 33.1* 34.7*  PLT 288 350    Basename 06/13/11 0605 06/12/11 1808  NA 137 138  K 3.8 3.9  CL 99 99  CO2 26 27  BUN 18 18  CREATININE 1.35 1.18  GLUCOSE 170* 199*  CALCIUM 8.9 10.2    Basename 06/12/11 1808  LABPT --  INR 1.07    Neurologically intact Neurovascular intact Incision: dressing C/D/I Compartment soft  Assessment/Plan: 1 Day Post-Op Procedure(s) (LRB): IRRIGATION AND DEBRIDEMENT EXTREMITY (Right) Will cont IV antibiotic Cont care per IM and ID Cultures thus far are negative Plan to keep drain in until Sunday PWB to the LE  Jaycen Vercher R. 06/13/2011, 5:25 PM

## 2011-06-13 NOTE — Progress Notes (Signed)
Pharmacy Note  Adjusted lovenox for BMI >= 30 and normal renal function. Will give 0.5 mg/kg q24h for dvt pxl.  Lovenox 75 mg sq q24h.  Thanks.  Arther Dames PharmD BCPS [phone] 06/13/2011 9:26 AM

## 2011-06-13 NOTE — Op Note (Signed)
NAME:  Timothy Trujillo, Timothy Trujillo NO.:  [id number]  MEDICAL RECORD NO.:  [id number]  LOCATION:  5015                         FACILITY:  [facility]  PHYSICIAN:  Jene Every, M.D.    DATE OF BIRTH:  1950-05-25  DATE OF PROCEDURE:  06/12/2011 DATE OF DISCHARGE:                              OPERATIVE REPORT   PREOPERATIVE DIAGNOSES: 1. Osteomyelitis of the right tibia. 2. Abscess of the right proximal tibia. 3. Septic tenosynovitis of the pes anserinus tendons sheath.  POSTOPERATIVE DIAGNOSES: 1. Osteomyelitis of the right tibia. 2. Abscess of the right proximal tibia. 3. Septic tenosynovitis of the pes anserinus tendons sheath.  PROCEDURES PERFORMED: 1. I and D of proximal tibia with bone marrow aspirate and biopsy of     the proximal tibia. 2. I and D of septic tenosynovitis of the pes anserinus tendon sheath. 3. I and D of subcutaneous abscess.  ANESTHESIA:  General.  SURGEON:  Jene Every, MD  ASSISTANT:  None.  DRAINS:  Hemovac.  CULTURES:  Bone and purulent abscess fluid for aerobic and anaerobic cultures and Gram stain.  HISTORY:  A 61 year old with osteomyelitis of the proximal tibia. Chronically, it appears to the proximal tibia into the subcutaneous tissue, and then insertion of the pes anserinus.  He was indicated for I and D, for cultures to obtain for IV antibiotics and treatment of the osteomyelitis.  Risk and benefits were discussed including infection, need for repeat debridement, DVT, PE, anesthetic complications, etc.  TECHNIQUE:  The patient in supine position.  After induction of adequate general anesthesia, no antibiotics for the cultures.  We made an incision over the proximal medial tibia over insertion of the pes anserinus tendon as well as over the area were the MRI noted a breakthrough and the cortex of the tibia.  There was inflamed hypertrophic periosteum and subcutaneous tissue.  Tendon sheath along the pes anserinus was identified.   There was a generalized purulence noted within that to the subcutaneous abscess extending the sinus tract down to a break in the proximal medial cortex measuring 1 x 1 cm.  We then curetted this keyhole in the proximal tibia and evacuated a copious portion of purulent material from the cavity seemingly like a Brodie's abscess in the proximal tibia, this was curetted and irrigated copiously until no further purulent material was obtained.  There was a small cavity in the bone noted.  We extended the debridement with a Yankauer suction tip along the distal portion of the intramedullary shaft of the tibia and approximately did not break into the knee joint.  There was no involvement of the knee joint.  After copious irrigation and debridement, I placed a Hemovac down into the proximal tibia and brought out through a lateral stab wound to the medial proximal tibia and I oversewed loosely the deep periosteum with 2-0 Vicryl simple sutures and subcu with 2-0 Vicryl simple sutures loosely and the skin was reapproximated with staples, and this was done loosely.  Again, on closure, there was no evidence of purulence material remaining and the drain was placed deep into the intramedullary canal.  Wound was dressed sterilely, secured with an Ace bandage.  The  patient was then extubated without difficulty and transported to the recovery room in satisfactory condition.  The patient tolerated the procedure well.  No complications.  No assistant.  Minimal blood loss.  SPECIMENS:  Cultures of both subcutaneous abscess as well as bone marrow aspirate and biopsy from the proximal tibia.  Started the vancomycin following obtaining the cultures.     Jene Every, M.D.     Cordelia Pen  D:  06/12/2011  T:  06/13/2011  Job:  [id number]

## 2011-06-13 NOTE — Progress Notes (Signed)
Patient ID: Timothy Trujillo, male   DOB: [birth year], 61 y.o.   MRN: [id number]  Subjective: No events overnight. Patient denies chest pain, shortness of breath, abdominal pain.   Objective:  Vital signs in last 24 hours:  Filed Vitals:   06/13/11 0015 06/13/11 0527 06/13/11 1241 06/13/11 2022  BP: 114/71 109/72 128/78 114/66  Pulse: 112 101 106 102  Temp: 98.1 F (36.7 C) 97.8 F (36.6 C) 98.3 F (36.8 C) 97.2 F (36.2 C)  TempSrc:      Resp: 19 18 18 18   Height:      Weight:      SpO2: 96% 97% 94% 98%    Intake/Output from previous day:   Intake/Output Summary (Last 24 hours) at 06/13/11 2107 Last data filed at 06/13/11 1800  Gross per 24 hour  Intake   1700 ml  Output    710 ml  Net    990 ml    Physical Exam: General: Alert, awake, oriented x3, in no acute distress. HEENT: No bruits, no goiter. Moist mucous membranes, no scleral icterus, no conjunctival pallor. Heart: Regular rate and rhythm, S1/S2 +, no murmurs, rubs, gallops. Lungs: Clear to auscultation bilaterally with minimal bibasilar crackles. No wheezing, no rhonchi, no rales.  Abdomen: Soft, nontender, nondistended, positive bowel sounds. Extremities: No clubbing or cyanosis, positive pedal pulses. Neuro: Grossly nonfocal.  Lab Results:  Basic Metabolic Panel:    Component Value Date/Time   NA 137 06/13/2011 0605   K 3.8 06/13/2011 0605   CL 99 06/13/2011 0605   CO2 26 06/13/2011 0605   BUN 18 06/13/2011 0605   CREATININE 1.35 06/13/2011 0605   GLUCOSE 170* 06/13/2011 0605   CALCIUM 8.9 06/13/2011 0605   CBC:    Component Value Date/Time   WBC 8.4 06/13/2011 0605   HGB 10.5* 06/13/2011 0605   HCT 33.1* 06/13/2011 0605   PLT 288 06/13/2011 0605   MCV 84.0 06/13/2011 0605   NEUTROABS 8.9* 08/25/2010 0528   LYMPHSABS 0.3* 08/25/2010 0528   MONOABS 0.4 08/25/2010 0528   EOSABS 0.1 08/25/2010 0528   BASOSABS 0.0 08/25/2010 0528      Lab 06/13/11 0605 06/12/11 1808  WBC 8.4 9.1  HGB 10.5* 11.4*  HCT 33.1* 34.7*    PLT 288 350  MCV 84.0 82.6  MCH 26.6 27.1  MCHC 31.7 32.9  RDW 14.5 14.4  LYMPHSABS -- --  MONOABS -- --  EOSABS -- --  BASOSABS -- --  BANDABS -- --    Lab 06/13/11 0605 06/12/11 1808  NA 137 138  K 3.8 3.9  CL 99 99  CO2 26 27  GLUCOSE 170* 199*  BUN 18 18  CREATININE 1.35 1.18  CALCIUM 8.9 10.2  MG -- 1.5    Lab 06/12/11 1808  INR 1.07  PROTIME --    Recent Results (from the past 240 hour(s))  MRSA PCR SCREENING     Status: Normal   Collection Time   06/12/11  7:30 PM      Component Value Range Status Comment   MRSA by PCR NEGATIVE  NEGATIVE  Final   CULTURE, ROUTINE-ABSCESS     Status: Normal (Preliminary result)   Collection Time   06/12/11 10:00 PM      Component Value Range Status Comment   Specimen Description ABSCESS LEG RIGHT   Final    Special Requests SPECIMEN A   Final    Gram Stain     Final    Value: ABUNDANT  WBC PRESENT,BOTH PMN AND MONONUCLEAR     NO ORGANISMS SEEN     Performed at [facility]   Culture NO GROWTH   Final    Report Status PENDING   Incomplete   ANAEROBIC CULTURE     Status: Normal (Preliminary result)   Collection Time   06/12/11 10:00 PM      Component Value Range Status Comment   Specimen Description ABSCESS LEG RIGHT   Final    Special Requests SPECIMEN A   Final    Gram Stain     Final    Value: ABUNDANT WBC PRESENT,BOTH PMN AND MONONUCLEAR     NO ORGANISMS SEEN     Performed at [facility]   Culture     Final    Value: NO ANAEROBES ISOLATED; CULTURE IN PROGRESS FOR 5 DAYS   Report Status PENDING   Incomplete   GRAM STAIN     Status: Normal   Collection Time   06/12/11 10:00 PM      Component Value Range Status Comment   Specimen Description ABSCESS LEG RIGHT   Final    Special Requests SPECIMEN A   Final    Gram Stain     Final    Value: ABUNDANT WBC PRESENT,BOTH PMN AND MONONUCLEAR     NO ORGANISMS SEEN   Report Status 06/12/2011 FINAL   Final   AFB CULTURE WITH SMEAR     Status: Normal (Preliminary  result)   Collection Time   06/12/11 10:00 PM      Component Value Range Status Comment   Specimen Description ABSCESS LEG RIGHT   Final    Special Requests SPECIMEN A   Final    ACID FAST SMEAR NO ACID FAST BACILLI SEEN   Final    Culture     Final    Value: CULTURE WILL BE EXAMINED FOR 6 WEEKS BEFORE ISSUING A FINAL REPORT   Report Status PENDING   Incomplete   TISSUE CULTURE     Status: Normal (Preliminary result)   Collection Time   06/12/11 10:04 PM      Component Value Range Status Comment   Specimen Description TISSUE TIBIA RIGHT   Final    Special Requests SPECIMEN B   Final    Gram Stain     Final    Value: ABUNDANT WBC PRESENT,BOTH PMN AND MONONUCLEAR     NO ORGANISMS SEEN     Performed at [facility]   Culture NO GROWTH   Final    Report Status PENDING   Incomplete   ANAEROBIC CULTURE     Status: Normal (Preliminary result)   Collection Time   06/12/11 10:04 PM      Component Value Range Status Comment   Specimen Description TISSUE TIBIA RIGHT   Final    Special Requests SPECIMEN B   Final    Gram Stain     Final    Value: ABUNDANT WBC PRESENT,BOTH PMN AND MONONUCLEAR     NO ORGANISMS SEEN     Performed at [facility]   Culture     Final    Value: NO ANAEROBES ISOLATED; CULTURE IN PROGRESS FOR 5 DAYS   Report Status PENDING   Incomplete   AFB CULTURE WITH SMEAR     Status: Normal (Preliminary result)   Collection Time   06/12/11 10:04 PM      Component Value Range Status Comment   Specimen Description TISSUE TIBIA RIGHT  Final    Special Requests SPECIMEN B   Final    ACID FAST SMEAR NO ACID FAST BACILLI SEEN   Final    Culture     Final    Value: CULTURE WILL BE EXAMINED FOR 6 WEEKS BEFORE ISSUING A FINAL REPORT   Report Status PENDING   Incomplete   GRAM STAIN     Status: Normal   Collection Time   06/12/11 10:04 PM      Component Value Range Status Comment   Specimen Description TISSUE TIBIA RIGHT   Final    Special Requests SPECIMEN B   Final      Gram Stain     Final    Value: ABUNDANT WBC PRESENT,BOTH PMN AND MONONUCLEAR     NO ORGANISMS SEEN   Report Status 06/12/2011 FINAL   Final     Studies/Results: No results found.  Medications: Scheduled Meds:   . aspirin  325 mg Oral Daily  . atorvastatin  40 mg Oral q1800  . enoxaparin (LOVENOX) injection  0.5 mg/kg Subcutaneous Q24H  . ezetimibe  10 mg Oral Daily  . furosemide  80 mg Oral TID  . HYDROmorphone      . insulin NPH  110 Units Subcutaneous BID  . isosorbide mononitrate  30 mg Oral Daily  . lisinopril  5 mg Oral Daily  . metoprolol  200 mg Oral BID  . piperacillin-tazobactam (ZOSYN)  IV  3.375 g Intravenous Q8H  . vancomycin  1,500 mg Intravenous Q12H  . vancomycin  2,500 mg Intravenous Once  . DISCONTD: chlorhexidine  60 mL Topical Once  . DISCONTD: dipyridamole  75 mg Oral TID  . DISCONTD: enoxaparin  40 mg Subcutaneous Q24H  . DISCONTD: enoxaparin  40 mg Subcutaneous Q24H   Continuous Infusions:  PRN Meds:.morphine injection, ondansetron (ZOFRAN) IV, oxyCODONE-acetaminophen, DISCONTD: HYDROmorphone, DISCONTD: propranolol, DISCONTD: propranolol, DISCONTD: sodium chloride irrigation  Assessment/Plan:  Principal Problem:  *Osteomyelitis of knee region  - please note the above finding on MRI  - pt underwent biopsy, results still pending - will follow up on cultures  - obtain CBC, CMET in AM  - continue antibiotics  Active Problems:  Peripheral vascular disease  - chronic   Heart disease  - no recent 2 D ECHO on file  - will continue Aspirin  Obesity  - BMI > 40   Diabetes mellitus  - uncontrolled  - will continue home medication regimen  - obtain A1C   Hypertension  - will continue home medication regimen   Hyperlipidemia  - stable  - plan of care and diagnosis, diagnostic studies and test results were discussed with pt and family at bedside  - pt and family verbalized understanding   EDUCATION - test results and diagnostic studies  were discussed with patient - patient erbalized the understanding - questions were answered at the bedside and contact information was provided for additional questions or concerns   LOS: 1 day   MAGICK-Kastin Cerda 06/13/2011, 9:07 PM  [facility] Pager: [phone]

## 2011-06-13 NOTE — Progress Notes (Signed)
06/13/11  HgbA1C is 9.4%.  May need to have diabetes medications reevaluated at discharge.  Smith Mince RN BSN

## 2011-06-13 NOTE — Progress Notes (Signed)
06/13/11 0000  Clinical Encounter Type  Visit Type Spiritual support;Social support (Pt requested prayer before surgery.)  Referral From Patient  Spiritual Encounters  Spiritual Needs Prayer;Emotional  Stress Factors  Patient Stress Factors (Per pt, family cancer hx increases anxiety.)    Late entry.  Visited with pt and family per pt request 7:45 pm - 9:15 pm on 06/12/11.  Mr. Ansell used humor to cope with stress, showed affection for his family, requested prayer before surgery, shared anxiety about spot on his bone (several family members have had or died of cancer, including his uncle, whose cancer was found in the bone).  Family is very welcoming and supportive, reporting very strong church support.  Built rapport with family and provided prayer at bedside before pt taken to pre-op.  Provided pastoral presence, listening, reflection, and witness to family's stories in waiting room.  Family very welcoming and appreciative of chaplain support.  During visit, Pixie Casino arrived with friends Waynetta Sandy and Orvilla Fus from their family [employer] near their home in [place] (near [place]).  Pixie Casino is a friend of pt's, as well as highly regarded family pastor.  Family welcomes ongoing support and would be grateful for follow-up chaplain visit tomorrow.  Will refer to day chaplain for follow-up care.  Avis Epley, [place] Chaplain [phone]

## 2011-06-14 ENCOUNTER — Encounter (HOSPITAL_COMMUNITY): Payer: Self-pay | Admitting: Specialist

## 2011-06-14 ENCOUNTER — Inpatient Hospital Stay (HOSPITAL_COMMUNITY): Payer: Medicare PPO

## 2011-06-14 LAB — GLUCOSE, CAPILLARY
Glucose-Capillary: 89 mg/dL (ref 70–99)
Glucose-Capillary: 109 mg/dL — ABNORMAL HIGH (ref 70–99)

## 2011-06-14 LAB — CBC
HCT: 31.1 % — ABNORMAL LOW (ref 39.0–52.0)
MCH: 27.1 pg (ref 26.0–34.0)
MCHC: 32.2 g/dL (ref 30.0–36.0)
MCV: 84.3 fL (ref 78.0–100.0)
RDW: 14.7 % (ref 11.5–15.5)

## 2011-06-14 LAB — BASIC METABOLIC PANEL
BUN: 26 mg/dL — ABNORMAL HIGH (ref 6–23)
Chloride: 100 mEq/L (ref 96–112)
Creatinine, Ser: 1.81 mg/dL — ABNORMAL HIGH (ref 0.50–1.35)
GFR calc Af Amer: 45 mL/min — ABNORMAL LOW (ref >90)
GFR calc non Af Amer: 38 mL/min — ABNORMAL LOW (ref >90)

## 2011-06-14 MED ORDER — ZOLPIDEM TARTRATE 5 MG PO TABS
5.0000 mg | ORAL_TABLET | Freq: Once | ORAL | Status: AC
  Administered 2011-06-14: 5 mg via ORAL
  Filled 2011-06-14: qty 1

## 2011-06-14 MED ORDER — METOPROLOL SUCCINATE ER 50 MG PO TB24
50.0000 mg | ORAL_TABLET | Freq: Two times a day (BID) | ORAL | Status: DC
  Administered 2011-06-14 – 2011-06-15 (×2): 50 mg via ORAL
  Filled 2011-06-14 (×3): qty 1

## 2011-06-14 MED ORDER — FUROSEMIDE 80 MG PO TABS
80.0000 mg | ORAL_TABLET | Freq: Two times a day (BID) | ORAL | Status: DC
  Administered 2011-06-15: 80 mg via ORAL
  Filled 2011-06-14 (×4): qty 1

## 2011-06-14 MED ORDER — ONDANSETRON HCL 4 MG/2ML IJ SOLN
INTRAMUSCULAR | Status: AC
  Filled 2011-06-14: qty 2

## 2011-06-14 MED ORDER — ONDANSETRON HCL 4 MG/2ML IJ SOLN
4.0000 mg | INTRAMUSCULAR | Status: DC | PRN
  Administered 2011-06-14 – 2011-06-15 (×4): 4 mg via INTRAVENOUS
  Filled 2011-06-14 (×3): qty 2

## 2011-06-14 MED ORDER — ISOSORBIDE MONONITRATE 15 MG HALF TABLET
15.0000 mg | ORAL_TABLET | Freq: Every day | ORAL | Status: DC
  Administered 2011-06-15: 15 mg via ORAL
  Filled 2011-06-14: qty 1

## 2011-06-14 NOTE — Progress Notes (Signed)
Patient ID: Timothy Trujillo, male   DOB: [birth year], 61 y.o.   MRN: [id number] Subjective: No events overnight. Patient denies chest pain, shortness of breath, abdominal pain.  Objective:  Vital signs in last 24 hours:  Filed Vitals:   06/14/11 0455 06/14/11 0500 06/14/11 0950 06/14/11 1332  BP: 99/53 100/61 105/73 90/58  Pulse: 86 86 92 79  Temp: 97.5 F (36.4 C)   97.5 F (36.4 C)  TempSrc:    Oral  Resp: 18   16  Height:      Weight:      SpO2: 98%   97%    Intake/Output from previous day:   Intake/Output Summary (Last 24 hours) at 06/14/11 1424 Last data filed at 06/14/11 1113  Gross per 24 hour  Intake    720 ml  Output   1170 ml  Net   -450 ml    Physical Exam: General: Alert, awake, oriented x3, in no acute distress. HEENT: No bruits, no goiter. Moist mucous membranes, no scleral icterus, no conjunctival pallor. Heart: Regular rate and rhythm, S1/S2 +, no murmurs, rubs, gallops. Lungs: Clear to auscultation bilaterally with bibasilar crackles. No wheezing, no rhonchi, no rales.  Abdomen: Soft, nontender, nondistended, positive bowel sounds. Extremities: No clubbing or cyanosis, no pitting edema,  positive pedal pulses. Neuro: Grossly nonfocal.  Lab Results:  Basic Metabolic Panel:    Component Value Date/Time   NA 138 06/14/2011 0745   K 3.9 06/14/2011 0745   CL 100 06/14/2011 0745   CO2 25 06/14/2011 0745   BUN 26* 06/14/2011 0745   CREATININE 1.81* 06/14/2011 0745   GLUCOSE 95 06/14/2011 0745   CALCIUM 9.2 06/14/2011 0745   CBC:    Component Value Date/Time   WBC 10.4 06/14/2011 0745   HGB 10.0* 06/14/2011 0745   HCT 31.1* 06/14/2011 0745   PLT 330 06/14/2011 0745   MCV 84.3 06/14/2011 0745   NEUTROABS 8.9* 08/25/2010 0528   LYMPHSABS 0.3* 08/25/2010 0528   MONOABS 0.4 08/25/2010 0528   EOSABS 0.1 08/25/2010 0528   BASOSABS 0.0 08/25/2010 0528      Lab 06/14/11 0745 06/13/11 0605 06/12/11 1808  WBC 10.4 8.4 9.1  HGB 10.0* 10.5* 11.4*  HCT 31.1* 33.1* 34.7*  PLT 330  288 350  MCV 84.3 84.0 82.6  MCH 27.1 26.6 27.1  MCHC 32.2 31.7 32.9  RDW 14.7 14.5 14.4  LYMPHSABS -- -- --  MONOABS -- -- --  EOSABS -- -- --  BASOSABS -- -- --  BANDABS -- -- --    Lab 06/14/11 0745 06/13/11 0605 06/12/11 1808  NA 138 137 138  K 3.9 3.8 3.9  CL 100 99 99  CO2 25 26 27   GLUCOSE 95 170* 199*  BUN 26* 18 18  CREATININE 1.81* 1.35 1.18  CALCIUM 9.2 8.9 10.2  MG -- -- 1.5    Lab 06/12/11 1808  INR 1.07  PROTIME --   Cardiac markers: No results found for this basename: CK:3,CKMB:3,TROPONINI:3,MYOGLOBIN:3 in the last 168 hours No components found with this basename: POCBNP:3 Recent Results (from the past 240 hour(s))  MRSA PCR SCREENING     Status: Normal   Collection Time   06/12/11  7:30 PM      Component Value Range Status Comment   MRSA by PCR NEGATIVE  NEGATIVE  Final   CULTURE, ROUTINE-ABSCESS     Status: Normal (Preliminary result)   Collection Time   06/12/11 10:00 PM      Component  Value Range Status Comment   Specimen Description ABSCESS LEG RIGHT   Final    Special Requests SPECIMEN A   Final    Gram Stain     Final    Value: ABUNDANT WBC PRESENT,BOTH PMN AND MONONUCLEAR     NO ORGANISMS SEEN     Performed at [facility]   Culture NO GROWTH 1 DAY   Final    Report Status PENDING   Incomplete   ANAEROBIC CULTURE     Status: Normal (Preliminary result)   Collection Time   06/12/11 10:00 PM      Component Value Range Status Comment   Specimen Description ABSCESS LEG RIGHT   Final    Special Requests SPECIMEN A   Final    Gram Stain     Final    Value: ABUNDANT WBC PRESENT,BOTH PMN AND MONONUCLEAR     NO ORGANISMS SEEN     Performed at [facility]   Culture     Final    Value: NO ANAEROBES ISOLATED; CULTURE IN PROGRESS FOR 5 DAYS   Report Status PENDING   Incomplete   GRAM STAIN     Status: Normal   Collection Time   06/12/11 10:00 PM      Component Value Range Status Comment   Specimen Description ABSCESS LEG RIGHT   Final     Special Requests SPECIMEN A   Final    Gram Stain     Final    Value: ABUNDANT WBC PRESENT,BOTH PMN AND MONONUCLEAR     NO ORGANISMS SEEN   Report Status 06/12/2011 FINAL   Final   AFB CULTURE WITH SMEAR     Status: Normal (Preliminary result)   Collection Time   06/12/11 10:00 PM      Component Value Range Status Comment   Specimen Description ABSCESS LEG RIGHT   Final    Special Requests SPECIMEN A   Final    ACID FAST SMEAR NO ACID FAST BACILLI SEEN   Final    Culture     Final    Value: CULTURE WILL BE EXAMINED FOR 6 WEEKS BEFORE ISSUING A FINAL REPORT   Report Status PENDING   Incomplete   TISSUE CULTURE     Status: Normal (Preliminary result)   Collection Time   06/12/11 10:04 PM      Component Value Range Status Comment   Specimen Description TISSUE TIBIA RIGHT   Final    Special Requests SPECIMEN B   Final    Gram Stain     Final    Value: ABUNDANT WBC PRESENT,BOTH PMN AND MONONUCLEAR     NO ORGANISMS SEEN     Performed at [facility]   Culture NO GROWTH 1 DAY   Final    Report Status PENDING   Incomplete   ANAEROBIC CULTURE     Status: Normal (Preliminary result)   Collection Time   06/12/11 10:04 PM      Component Value Range Status Comment   Specimen Description TISSUE TIBIA RIGHT   Final    Special Requests SPECIMEN B   Final    Gram Stain     Final    Value: ABUNDANT WBC PRESENT,BOTH PMN AND MONONUCLEAR     NO ORGANISMS SEEN     Performed at [facility]   Culture     Final    Value: NO ANAEROBES ISOLATED; CULTURE IN PROGRESS FOR 5 DAYS   Report Status PENDING  Incomplete   AFB CULTURE WITH SMEAR     Status: Normal (Preliminary result)   Collection Time   06/12/11 10:04 PM      Component Value Range Status Comment   Specimen Description TISSUE TIBIA RIGHT   Final    Special Requests SPECIMEN B   Final    ACID FAST SMEAR NO ACID FAST BACILLI SEEN   Final    Culture     Final    Value: CULTURE WILL BE EXAMINED FOR 6 WEEKS BEFORE ISSUING A FINAL  REPORT   Report Status PENDING   Incomplete   GRAM STAIN     Status: Normal   Collection Time   06/12/11 10:04 PM      Component Value Range Status Comment   Specimen Description TISSUE TIBIA RIGHT   Final    Special Requests SPECIMEN B   Final    Gram Stain     Final    Value: ABUNDANT WBC PRESENT,BOTH PMN AND MONONUCLEAR     NO ORGANISMS SEEN   Report Status 06/12/2011 FINAL   Final     Studies/Results: Dg Chest Port 1 View  06/14/2011  *RADIOLOGY REPORT*  Clinical Data: Shortness of breath.  PORTABLE CHEST - 1 VIEW  Comparison: Plain film of the chest 02/04/2011 and 08/20/2010.  CT chest 08/22/2010.  Findings: The patient is status post CABG.  There is cardiomegaly and bilateral alveolar and interstitial disease.  No pneumothorax or pleural effusion.  IMPRESSION: Cardiomegaly and bilateral airspace disease likely due to pulmonary edema.  Original Report Authenticated By: Bernadene Bell. Maricela Curet, M.D.    Medications: Scheduled Meds:   . aspirin  325 mg Oral Daily  . atorvastatin  40 mg Oral q1800  . enoxaparin (LOVENOX) injection  0.5 mg/kg Subcutaneous Q24H  . ezetimibe  10 mg Oral Daily  . furosemide  80 mg Oral BID  . insulin NPH  110 Units Subcutaneous BID  . isosorbide mononitrate  30 mg Oral Daily  . lisinopril  5 mg Oral Daily  . metoprolol  200 mg Oral BID  . ondansetron      . piperacillin-tazobactam (ZOSYN)  IV  3.375 g Intravenous Q8H  . vancomycin  1,500 mg Intravenous Q12H  . vancomycin  2,500 mg Intravenous Once  . zolpidem  5 mg Oral Once  . DISCONTD: furosemide  80 mg Oral TID   Continuous Infusions:  PRN Meds:.morphine injection, ondansetron, oxyCODONE-acetaminophen  Assessment/Plan:  Principal Problem:  *Osteomyelitis of knee region  - please note the above finding on MRI  - pt underwent biopsy, results still pending  - will follow up on cultures  - obtain CBC, CMET in AM  - continue antibiotics   Active Problems:  Peripheral vascular disease  -  chronic   Heart disease  - no recent 2 D ECHO on file  - will continue Aspirin   Obesity  - BMI > 40   Diabetes mellitus  - uncontrolled  - will continue home medication regimen  - obtain A1C   Hypertension  - will hold home medications given soft BP - monitor vitals per floor protocol  Hyperlipidemia  - stable  - plan of care and diagnosis, diagnostic studies and test results were discussed with pt and family at bedside  - pt and family verbalized understanding   EDUCATION  - test results and diagnostic studies were discussed with patient  - patient erbalized the understanding  - questions were answered at the bedside and contact information was  provided for additional questions or concerns    LOS: 2 days   MAGICK-Jeslin Bazinet 06/14/2011, 2:24 PM  [facility] Pager: [phone]

## 2011-06-14 NOTE — Progress Notes (Signed)
PHYSICAL THERAPY EVALUATION  06/14/11 1134  PT Visit Information  Last PT Received On 06/14/11  Patient Stated Goals  Goal #1 get my legs back under me (walk)  Restrictions  Weight Bearing Restrictions Yes  RLE Weight Bearing PWB  RLE Partial Weight Bearing Percentage or Pounds 25-50%  Other Position/Activity Restrictions limit activity to primarily traansfers; orders received from [place]  Lives With Alone  Receives Help From Other (Comment) (63 year old grandson checks in on pt. daily)  Type of Home House  Home Layout One level  Home Access Ramped entrance  Bathroom Shower/Tub Tub/shower unit;Curtain  Bathroom Toilet Handicapped height  Bathroom Accessibility Yes  How Accessible Accessible via wheelchair  Home Adaptive Equipment Bedside commode/3-in-1;Wheelchair - powered;Wheelchair - manual;Shower chair without back;Walker - rolling;Hand-held shower hose  Prior Function  Level of Independence Requires assistive device for independence;Independent with basic ADLs;Independent with homemaking with wheelchair;Independent with transfers;Other (comment) (pt. requires frequent rests to complete tasks)  Able to Take Stairs? No  Driving Yes  Vocation On disability  Cognition  Arousal/Alertness Awake/alert  Overall Cognitive Status Appears within functional limits for tasks assessed  Orientation Level Oriented X4  Sensation  Light Touch Impaired by gross assessment (both feet)  Bed Mobility  Bed Mobility Yes  Supine to Sit 5: Supervision;With rails;HOB elevated (Comment degrees)  Supine to Sit Details (indicate cue type and reason) safety cues  Sit to Supine Not Tested (comment)  Transfers  Transfers Yes  Sit to Stand 1: +2 Total assist;Patient percentage (comment);Other (comment);With upper extremity assist;From bed (pt. 61%; second person mainly for safety)  Sit to Stand Details (indicate cue type and reason) vc's for 25% PWB; pt. tends to be heavy on PWB  status  Stand to Sit 3: Mod assist;With armrests;To chair/3-in-1  Stand to Sit Details assist to slow descent and cues for safety  Stand Pivot Transfers 3: Mod assist  Stand Pivot Transfer Details (indicate cue type and reason) cues and assist for 25% PWB  Ambulation/Gait  Ambulation/Gait No  Stairs No  RUE Assessment  RUE Assessment Not tested (defer to OT)  LUE Assessment  LUE Assessment Not tested (defer to Ot)  RLE Assessment  RLE Assessment Not tested (deferred due to surgery and poor bone integrity per PA)  LLE Assessment  LLE Assessment WFL  PT - End of Session  Equipment Utilized During Treatment Gait belt  Activity Tolerance Patient limited by fatigue;Treatment limited secondary to medical complications (Comment);Other (comment) (limited by SOB, limited by PWB status)  Patient left in chair;with call bell in reach;Other (comment) (LEs elevated in recliner)  Nurse Communication Mobility status for transfers;Mobility status for ambulation;Weight bearing status  General  Behavior During Session [facility] for tasks performed  Cognition [facility] for tasks performed  PT Assessment  Clinical Impression Statement Pt. is s/p I/D for abscess of proximal right tibia and is limited to 25-50% PWB for transfers only.  Has decreased independence of transfers and will need PT to maximize his function before return home.  Recommend pt. be at W/C level at home due to difficulty with PWB.  PT Recommendation/Assessment Patient will need skilled PT in the acute care venue  PT Problem List Decreased activity tolerance;Decreased mobility;Decreased knowledge of use of DME;Decreased knowledge of precautions;Cardiopulmonary status limiting activity;Impaired sensation;Pain  PT Therapy Diagnosis  Difficulty walking;Acute pain  PT Plan  PT Frequency Min 6X/week  PT Treatment/Interventions DME instruction;Functional mobility training;Therapeutic activities;Patient/family education  PT Recommendation    Recommendations for Other Services  OT consult  Follow Up Recommendations Home health PT;Supervision - Intermittent  Equipment Recommended None recommended by PT  Individuals Consulted  Consulted and Agree with Results and Recommendations Patient  Acute Rehab PT Goals  PT Goal Formulation With patient  Time For Goal Achievement 7 days  Pt will go Supine/Side to Sit Independently  PT Goal: Supine/Side to Sit - Progress Goal set today  Pt will go Sit to Supine/Side Independently  PT Goal: Sit to Supine/Side - Progress Goal set today  Pt will go Sit to Stand with modified independence;with upper extremity assist  PT Goal: Sit to Stand - Progress Goal set today  Pt will go Stand to Sit with modified independence  PT Goal: Stand to Sit - Progress Goal set today  Pt will Transfer Bed to Chair/Chair to Bed with modified independence  PT Transfer Goal: Bed to Chair/Chair to Bed - Progress Goal set today   Weldon Picking PT Acute Rehab Services [phone] [place] [phone]

## 2011-06-14 NOTE — Progress Notes (Signed)
Patient complaining of insomnia and states he can only sleep for a few minutes at a time.  Patient denies pain and refuses pain medication at this time.  Everett Graff, NP notified of patient complaint; new order received for a one time dose of ambien.  Will continue to monitor.

## 2011-06-14 NOTE — Progress Notes (Signed)
ANTIBIOTIC CONSULT NOTE - FOLLOW UP  Pharmacy Consult for Vanco/Zosyn Indication: Knee infection  Allergies  Allergen Reactions  . Stadol (Butorphanol Tartrate) Other (See Comments)    "makes him crazy"    Patient Measurements: Height: 6\' 3"  (190.5 cm) Weight: 329 lb (149.233 kg) IBW/kg (Calculated) : 84.5  Adjusted Body Weight: 110 kg  Vital Signs: Temp: 97.5 F (36.4 C) (03/08 0455) BP: 100/61 mmHg (03/08 0500) Pulse Rate: 86  (03/08 0500) Intake/Output from previous day: 03/07 0701 - 03/08 0700 In: 720 [P.O.:120; IV Piggyback:600] Out: 970 [Urine:820; Drains:150] Intake/Output from this shift:    Labs:  Basename 06/14/11 0745 06/13/11 0605 06/12/11 1808  WBC 10.4 8.4 9.1  HGB 10.0* 10.5* 11.4*  PLT 330 288 350  LABCREA -- -- --  CREATININE 1.81* 1.35 1.18   Estimated Creatinine Clearance: 66.1 ml/min (by C-G formula based on Cr of 1.81).  Basename 06/14/11 0745  VANCOTROUGH 18.0  VANCOPEAK --  VANCORANDOM --  GENTTROUGH --  GENTPEAK --  GENTRANDOM --  TOBRATROUGH --  TOBRAPEAK --  TOBRARND --  AMIKACINPEAK --  AMIKACINTROU --  AMIKACIN --     Microbiology: Recent Results (from the past 720 hour(s))  MRSA PCR SCREENING     Status: Normal   Collection Time   06/12/11  7:30 PM      Component Value Range Status Comment   MRSA by PCR NEGATIVE  NEGATIVE  Final   CULTURE, ROUTINE-ABSCESS     Status: Normal (Preliminary result)   Collection Time   06/12/11 10:00 PM      Component Value Range Status Comment   Specimen Description ABSCESS LEG RIGHT   Final    Special Requests SPECIMEN A   Final    Gram Stain     Final    Value: ABUNDANT WBC PRESENT,BOTH PMN AND MONONUCLEAR     NO ORGANISMS SEEN     Performed at [facility]   Culture NO GROWTH 1 DAY   Final    Report Status PENDING   Incomplete   ANAEROBIC CULTURE     Status: Normal (Preliminary result)   Collection Time   06/12/11 10:00 PM      Component Value Range Status Comment   Specimen  Description ABSCESS LEG RIGHT   Final    Special Requests SPECIMEN A   Final    Gram Stain     Final    Value: ABUNDANT WBC PRESENT,BOTH PMN AND MONONUCLEAR     NO ORGANISMS SEEN     Performed at [facility]   Culture     Final    Value: NO ANAEROBES ISOLATED; CULTURE IN PROGRESS FOR 5 DAYS   Report Status PENDING   Incomplete   GRAM STAIN     Status: Normal   Collection Time   06/12/11 10:00 PM      Component Value Range Status Comment   Specimen Description ABSCESS LEG RIGHT   Final    Special Requests SPECIMEN A   Final    Gram Stain     Final    Value: ABUNDANT WBC PRESENT,BOTH PMN AND MONONUCLEAR     NO ORGANISMS SEEN   Report Status 06/12/2011 FINAL   Final   AFB CULTURE WITH SMEAR     Status: Normal (Preliminary result)   Collection Time   06/12/11 10:00 PM      Component Value Range Status Comment   Specimen Description ABSCESS LEG RIGHT   Final    Special  Requests SPECIMEN A   Final    ACID FAST SMEAR NO ACID FAST BACILLI SEEN   Final    Culture     Final    Value: CULTURE WILL BE EXAMINED FOR 6 WEEKS BEFORE ISSUING A FINAL REPORT   Report Status PENDING   Incomplete   TISSUE CULTURE     Status: Normal (Preliminary result)   Collection Time   06/12/11 10:04 PM      Component Value Range Status Comment   Specimen Description TISSUE TIBIA RIGHT   Final    Special Requests SPECIMEN B   Final    Gram Stain     Final    Value: ABUNDANT WBC PRESENT,BOTH PMN AND MONONUCLEAR     NO ORGANISMS SEEN     Performed at [facility]   Culture NO GROWTH 1 DAY   Final    Report Status PENDING   Incomplete   ANAEROBIC CULTURE     Status: Normal (Preliminary result)   Collection Time   06/12/11 10:04 PM      Component Value Range Status Comment   Specimen Description TISSUE TIBIA RIGHT   Final    Special Requests SPECIMEN B   Final    Gram Stain     Final    Value: ABUNDANT WBC PRESENT,BOTH PMN AND MONONUCLEAR     NO ORGANISMS SEEN     Performed at [facility]     Culture     Final    Value: NO ANAEROBES ISOLATED; CULTURE IN PROGRESS FOR 5 DAYS   Report Status PENDING   Incomplete   AFB CULTURE WITH SMEAR     Status: Normal (Preliminary result)   Collection Time   06/12/11 10:04 PM      Component Value Range Status Comment   Specimen Description TISSUE TIBIA RIGHT   Final    Special Requests SPECIMEN B   Final    ACID FAST SMEAR NO ACID FAST BACILLI SEEN   Final    Culture     Final    Value: CULTURE WILL BE EXAMINED FOR 6 WEEKS BEFORE ISSUING A FINAL REPORT   Report Status PENDING   Incomplete   GRAM STAIN     Status: Normal   Collection Time   06/12/11 10:04 PM      Component Value Range Status Comment   Specimen Description TISSUE TIBIA RIGHT   Final    Special Requests SPECIMEN B   Final    Gram Stain     Final    Value: ABUNDANT WBC PRESENT,BOTH PMN AND MONONUCLEAR     NO ORGANISMS SEEN   Report Status 06/12/2011 FINAL   Final     Anti-infectives     Start     Dose/Rate Route Frequency Ordered Stop   06/13/11 0800   vancomycin (VANCOCIN) 1,500 mg in sodium chloride 0.9 % 500 mL IVPB        1,500 mg 250 mL/hr over 120 Minutes Intravenous Every 12 hours 06/12/11 1927     06/12/11 2000   vancomycin (VANCOCIN) 2,500 mg in sodium chloride 0.9 % 500 mL IVPB        2,500 mg 250 mL/hr over 120 Minutes Intravenous  Once 06/12/11 1927     06/12/11 2000  piperacillin-tazobactam (ZOSYN) IVPB 3.375 g       3.375 g 12.5 mL/hr over 240 Minutes Intravenous Every 8 hours 06/12/11 1927  Assessment: 44 YOM with Swelling of the right knee area, MIR (2/26) positive for osteomyelitis with possible bone infarction, will need bone biopsy. Adjusted BW 110 kg. Scr up to 1.8 today. CrCl calculated around 65. Afebrile. WBC up to 10.4. Cultures pending s/p I&D. Vanco trough this am = 18 in goal range.  Anticoag: Adjusted lovenox to 75 mg sq q24 for dvt pxl, nl renal fxn and bmi>=30 (A.G. 3/7)  Cards: HTN, HLD: ASA 325mg , Lipitor, Zetia, po  Lasix, Imdur, Lisinopril, metoprolol  Endo: A1C = 9.4. CBG 82-222 on NPH only 110 uts/BID  GI/Nutrition: Obesity   Goal of Therapy:  Vancomycin trough level 15-20 mcg/ml  Plan:  Continue Vancomycin 1500mg  IV q12h. Zosyn 3.375g IV q8h.  Merilynn Finland, [employer] 06/14/2011,9:32 AM

## 2011-06-15 DIAGNOSIS — M869 Osteomyelitis, unspecified: Principal | ICD-10-CM

## 2011-06-15 LAB — BASIC METABOLIC PANEL
BUN: 35 mg/dL — ABNORMAL HIGH (ref 6–23)
Chloride: 99 mEq/L (ref 96–112)
GFR calc non Af Amer: 30 mL/min — ABNORMAL LOW (ref >90)
Glucose, Bld: 97 mg/dL (ref 70–99)
Potassium: 4.3 mEq/L (ref 3.5–5.1)

## 2011-06-15 LAB — CBC
HCT: 31.6 % — ABNORMAL LOW (ref 39.0–52.0)
Hemoglobin: 10.1 g/dL — ABNORMAL LOW (ref 13.0–17.0)
MCHC: 32 g/dL (ref 30.0–36.0)

## 2011-06-15 LAB — GLUCOSE, CAPILLARY
Glucose-Capillary: 125 mg/dL — ABNORMAL HIGH (ref 70–99)
Glucose-Capillary: 152 mg/dL — ABNORMAL HIGH (ref 70–99)

## 2011-06-15 MED ORDER — FUROSEMIDE 40 MG PO TABS
40.0000 mg | ORAL_TABLET | Freq: Every day | ORAL | Status: DC
  Filled 2011-06-15: qty 1

## 2011-06-15 MED ORDER — METOPROLOL SUCCINATE 12.5 MG HALF TABLET
12.5000 mg | ORAL_TABLET | Freq: Two times a day (BID) | ORAL | Status: DC
  Administered 2011-06-15 – 2011-06-18 (×6): 12.5 mg via ORAL
  Filled 2011-06-15 (×7): qty 1

## 2011-06-15 MED ORDER — DEXTROSE 5 % IV SOLN
1.0000 g | INTRAVENOUS | Status: DC
  Administered 2011-06-15 – 2011-06-18 (×4): 1 g via INTRAVENOUS
  Filled 2011-06-15 (×4): qty 10

## 2011-06-15 NOTE — Progress Notes (Signed)
INFECTIOUS DISEASE PROGRESS NOTE  ID: Timothy Trujillo is a 61 y.o. male with   Principal Problem:  *Osteomyelitis of knee region Active Problems:  Heart disease  Obesity  Peripheral vascular disease  Diabetes mellitus  Hypertension  Hyperlipidemia  Subjective: 60 yo M with hx of DM2, CAD, HTN, R knee and found to have R tibial bone infract and abscess. He went to OR 06-12-11 and had I & D.  His Cx is NGTD, AFB stains (-).    Abtx:  Anti-infectives     Start     Dose/Rate Route Frequency Ordered Stop   06/13/11 0800   vancomycin (VANCOCIN) 1,500 mg in sodium chloride 0.9 % 500 mL IVPB        1,500 mg 250 mL/hr over 120 Minutes Intravenous Every 12 hours 06/12/11 1927     06/12/11 2000   vancomycin (VANCOCIN) 2,500 mg in sodium chloride 0.9 % 500 mL IVPB  Status:  Discontinued        2,500 mg 250 mL/hr over 120 Minutes Intravenous  Once 06/12/11 1927 06/15/11 1027   06/12/11 2000  piperacillin-tazobactam (ZOSYN) IVPB 3.375 g       3.375 g 12.5 mL/hr over 240 Minutes Intravenous Every 8 hours 06/12/11 1927            Medications:  Scheduled:   . aspirin  325 mg Oral Daily  . atorvastatin  40 mg Oral q1800  . enoxaparin (LOVENOX) injection  0.5 mg/kg Subcutaneous Q24H  . ezetimibe  10 mg Oral Daily  . furosemide  80 mg Oral BID  . insulin NPH  110 Units Subcutaneous BID  . isosorbide mononitrate  15 mg Oral Daily  . metoprolol  50 mg Oral BID  . ondansetron      . piperacillin-tazobactam (ZOSYN)  IV  3.375 g Intravenous Q8H  . vancomycin  1,500 mg Intravenous Q12H  . DISCONTD: furosemide  80 mg Oral TID  . DISCONTD: isosorbide mononitrate  30 mg Oral Daily  . DISCONTD: lisinopril  5 mg Oral Daily  . DISCONTD: metoprolol  200 mg Oral BID  . DISCONTD: vancomycin  2,500 mg Intravenous Once    Objective: Vital signs in last 24 hours: Temp:  [94.6 F (34.8 C)-97.5 F (36.4 C)] 97.2 F (36.2 C) (03/09 0619) Pulse Rate:  [79-89] 89  (03/09 0619) Resp:  [16-20] 20   (03/09 0619) BP: (87-116)/(53-70) 105/70 mmHg (03/09 0619) SpO2:  [97 %-100 %] 100 % (03/09 0619)   General appearance: alert, cooperative and no distress Extremities: peripheral IV RUE and LUE. RUE wrapped. Incision/Wound: R upper calf wrapped. Drain in place  Lab Results  Basename 06/15/11 0630 06/14/11 0745  WBC 10.4 10.4  HGB 10.1* 10.0*  HCT 31.6* 31.1*  NA 140 138  K 4.3 3.9  CL 99 100  CO2 28 25  BUN 35* 26*  CREATININE 2.22* 1.81*  GLU -- --   Liver Panel No results found for this basename: PROT:2,ALBUMIN:2,AST:2,ALT:2,ALKPHOS:2,BILITOT:2,BILIDIR:2,IBILI:2 in the last 72 hours Sedimentation Rate No results found for this basename: ESRSEDRATE in the last 72 hours C-Reactive Protein No results found for this basename: CRP:2 in the last 72 hours  Microbiology: Recent Results (from the past 240 hour(s))  MRSA PCR SCREENING     Status: Normal   Collection Time   06/12/11  7:30 PM      Component Value Range Status Comment   MRSA by PCR NEGATIVE  NEGATIVE  Final   CULTURE, ROUTINE-ABSCESS  Status: Normal (Preliminary result)   Collection Time   06/12/11 10:00 PM      Component Value Range Status Comment   Specimen Description ABSCESS LEG RIGHT   Final    Special Requests SPECIMEN A   Final    Gram Stain     Final    Value: ABUNDANT WBC PRESENT,BOTH PMN AND MONONUCLEAR     NO ORGANISMS SEEN     Performed at [facility]   Culture NO GROWTH 2 DAYS   Final    Report Status PENDING   Incomplete   ANAEROBIC CULTURE     Status: Normal (Preliminary result)   Collection Time   06/12/11 10:00 PM      Component Value Range Status Comment   Specimen Description ABSCESS LEG RIGHT   Final    Special Requests SPECIMEN A   Final    Gram Stain     Final    Value: ABUNDANT WBC PRESENT,BOTH PMN AND MONONUCLEAR     NO ORGANISMS SEEN     Performed at [facility]   Culture     Final    Value: NO ANAEROBES ISOLATED; CULTURE IN PROGRESS FOR 5 DAYS   Report Status  PENDING   Incomplete   GRAM STAIN     Status: Normal   Collection Time   06/12/11 10:00 PM      Component Value Range Status Comment   Specimen Description ABSCESS LEG RIGHT   Final    Special Requests SPECIMEN A   Final    Gram Stain     Final    Value: ABUNDANT WBC PRESENT,BOTH PMN AND MONONUCLEAR     NO ORGANISMS SEEN   Report Status 06/12/2011 FINAL   Final   AFB CULTURE WITH SMEAR     Status: Normal (Preliminary result)   Collection Time   06/12/11 10:00 PM      Component Value Range Status Comment   Specimen Description ABSCESS LEG RIGHT   Final    Special Requests SPECIMEN A   Final    ACID FAST SMEAR NO ACID FAST BACILLI SEEN   Final    Culture     Final    Value: CULTURE WILL BE EXAMINED FOR 6 WEEKS BEFORE ISSUING A FINAL REPORT   Report Status PENDING   Incomplete   TISSUE CULTURE     Status: Normal (Preliminary result)   Collection Time   06/12/11 10:04 PM      Component Value Range Status Comment   Specimen Description TISSUE TIBIA RIGHT   Final    Special Requests SPECIMEN B   Final    Gram Stain     Final    Value: ABUNDANT WBC PRESENT,BOTH PMN AND MONONUCLEAR     NO ORGANISMS SEEN     Performed at [facility]   Culture NO GROWTH 2 DAYS   Final    Report Status PENDING   Incomplete   ANAEROBIC CULTURE     Status: Normal (Preliminary result)   Collection Time   06/12/11 10:04 PM      Component Value Range Status Comment   Specimen Description TISSUE TIBIA RIGHT   Final    Special Requests SPECIMEN B   Final    Gram Stain     Final    Value: ABUNDANT WBC PRESENT,BOTH PMN AND MONONUCLEAR     NO ORGANISMS SEEN     Performed at [facility]   Culture  Final    Value: NO ANAEROBES ISOLATED; CULTURE IN PROGRESS FOR 5 DAYS   Report Status PENDING   Incomplete   AFB CULTURE WITH SMEAR     Status: Normal (Preliminary result)   Collection Time   06/12/11 10:04 PM      Component Value Range Status Comment   Specimen Description TISSUE TIBIA RIGHT   Final      Special Requests SPECIMEN B   Final    ACID FAST SMEAR NO ACID FAST BACILLI SEEN   Final    Culture     Final    Value: CULTURE WILL BE EXAMINED FOR 6 WEEKS BEFORE ISSUING A FINAL REPORT   Report Status PENDING   Incomplete   GRAM STAIN     Status: Normal   Collection Time   06/12/11 10:04 PM      Component Value Range Status Comment   Specimen Description TISSUE TIBIA RIGHT   Final    Special Requests SPECIMEN B   Final    Gram Stain     Final    Value: ABUNDANT WBC PRESENT,BOTH PMN AND MONONUCLEAR     NO ORGANISMS SEEN   Report Status 06/12/2011 FINAL   Final     Studies/Results: Dg Chest Port 1 View  06/14/2011  *RADIOLOGY REPORT*  Clinical Data: Shortness of breath.  PORTABLE CHEST - 1 VIEW  Comparison: Plain film of the chest 02/04/2011 and 08/20/2010.  CT chest 08/22/2010.  Findings: The patient is status post CABG.  There is cardiomegaly and bilateral alveolar and interstitial disease.  No pneumothorax or pleural effusion.  IMPRESSION: Cardiomegaly and bilateral airspace disease likely due to pulmonary edema.  Original Report Authenticated By: Bernadene Bell. Maricela Curet, M.D.     Assessment/Plan: Ostemyelitis, abscess Bone Infarcts Day 4 anbx Will change his anbx to ceftriaxone and vanco. Not sure he needs anaerobe coverage.  Place PIC, plan for 6 weeks of anbx Await Cx  Johny Sax Infectious Diseases [phone] 06/15/2011, 12:56 PM

## 2011-06-15 NOTE — Progress Notes (Signed)
PT TREATMENT NOTE  06/15/11 0820  PT Visit Information  Last PT Received On 06/15/11  Restrictions  Weight Bearing Restrictions Yes  RLE Weight Bearing PWB  RLE Partial Weight Bearing Percentage or Pounds 25-50%  Other Position/Activity Restrictions limit activity to primarily traansfers; orders received from [place]  Bed Mobility  Bed Mobility Yes  Supine to Sit 5: Supervision;With rails;HOB elevated (Comment degrees)  Supine to Sit Details (indicate cue type and reason) pt. uses safe technique up to EOB  Sit to Supine Not Tested (comment)  Transfers  Transfers Yes  Sit to Stand 1: +2 Total assist;Patient percentage (comment);Other (comment);With upper extremity assist;From bed (pt. 90% and second person for safety and equipment managemen)  Sit to Stand Details (indicate cue type and reason) vc's for PWB 25% and safe technique  Stand to Sit 3: Mod assist;With armrests;To chair/3-in-1  Stand to Sit Details vc's to remind to control descent and for maintaining PWB  Stand Pivot Transfers 4: Min assist  Stand Pivot Transfer Details (indicate cue type and reason) vc's to maintain PWB  Ambulation/Gait  Ambulation/Gait No  Stairs No  PT - End of Session  Equipment Utilized During Treatment Gait belt  Activity Tolerance Patient tolerated treatment well;Treatment limited secondary to medical complications (Comment);Other (comment) (limited by SOB and PWB staatus)  Patient left in chair;with call bell in reach;Other (comment)  Nurse Communication Mobility status for transfers;Mobility status for ambulation;Weight bearing status  General  Behavior During Session [facility] for tasks performed  [facility] for tasks performed  PT - Assessment/Plan  Comments on Treatment Session Pt. much less painful/sore today.  Continued education/reinforcement of PWB limitations to allow for maximum healing.  Pt. verbalizes understanding and says he will comply to the best of his ability.  He feels he will  be ready for DC home when medically ready.  He indicates he has had to function with mobility limitations in the recent past.  I believe he will be able to function within his home at [facility] level with son/grandsons checking on him and doing his grocery shopping/transporting him to appointment s etc.  PT Frequency Min 6X/week  Recommendations for Other Services OT consult  Follow Up Recommendations Home health PT;Supervision - Intermittent  Equipment Recommended None recommended by PT  Acute Rehab PT Goals  PT Goal: Supine/Side to Sit - Progress Progressing toward goal  PT Goal: Sit to Stand - Progress Progressing toward goal  PT Goal: Stand to Sit - Progress Progressing toward goal  PT Transfer Goal: Bed to Chair/Chair to Bed - Progress Progressing toward goal   Weldon Picking PT Acute Rehab Services [phone] [place] [phone]

## 2011-06-15 NOTE — Progress Notes (Signed)
Patient ID: Timothy Trujillo, male   DOB: Aug 24, 1950, 61 y.o.   MRN: [id number]  Subjective: No events overnight. Patient denies chest pain, shortness of breath, abdominal pain.   Objective:  Vital signs in last 24 hours:  Filed Vitals:   06/14/11 1601 06/14/11 1816 06/14/11 2205 06/15/11 0619  BP: 87/53 88/58 116/68 105/70  Pulse:  80 83 89  Temp:  94.6 F (34.8 C) 96.8 F (36 C) 97.2 F (36.2 C)  TempSrc:  Oral    Resp:  16 20 20   Height:      Weight:      SpO2:  100% 99% 100%    Intake/Output from previous day:   Intake/Output Summary (Last 24 hours) at 06/15/11 1303 Last data filed at 06/15/11 0700  Gross per 24 hour  Intake    660 ml  Output    545 ml  Net    115 ml    Physical Exam: General: Alert, awake, oriented x3, in no acute distress. HEENT: No bruits, no goiter. Moist mucous membranes, no scleral icterus, no conjunctival pallor. Heart: Regular rate and rhythm, S1/S2 +, no murmurs, rubs, gallops. Lungs: Clear to auscultation bilaterally with somewhat decreased sounds at bases. No wheezing, no rhonchi, no rales.  Abdomen: Soft, nontender, nondistended, positive bowel sounds. Extremities: No clubbing or cyanosis, no pitting edema,  positive pedal pulses. Neuro: Grossly nonfocal.  Lab Results:  Basic Metabolic Panel:    Component Value Date/Time   NA 140 06/15/2011 0630   K 4.3 06/15/2011 0630   CL 99 06/15/2011 0630   CO2 28 06/15/2011 0630   BUN 35* 06/15/2011 0630   CREATININE 2.22* 06/15/2011 0630   GLUCOSE 97 06/15/2011 0630   CALCIUM 9.3 06/15/2011 0630   CBC:    Component Value Date/Time   WBC 10.4 06/15/2011 0630   HGB 10.1* 06/15/2011 0630   HCT 31.6* 06/15/2011 0630   PLT 351 06/15/2011 0630   MCV 84.0 06/15/2011 0630   NEUTROABS 8.9* 08/25/2010 0528   LYMPHSABS 0.3* 08/25/2010 0528   MONOABS 0.4 08/25/2010 0528   EOSABS 0.1 08/25/2010 0528   BASOSABS 0.0 08/25/2010 0528      Lab 06/15/11 0630 06/14/11 0745 06/13/11 0605 06/12/11 1808  WBC 10.4 10.4 8.4 9.1    HGB 10.1* 10.0* 10.5* 11.4*  HCT 31.6* 31.1* 33.1* 34.7*  PLT 351 330 288 350  MCV 84.0 84.3 84.0 82.6  MCH 26.9 27.1 26.6 27.1  MCHC 32.0 32.2 31.7 32.9  RDW 14.7 14.7 14.5 14.4  LYMPHSABS -- -- -- --  MONOABS -- -- -- --  EOSABS -- -- -- --  BASOSABS -- -- -- --  BANDABS -- -- -- --    Lab 06/15/11 0630 06/14/11 0745 06/13/11 0605 06/12/11 1808  NA 140 138 137 138  K 4.3 3.9 3.8 3.9  CL 99 100 99 99  CO2 28 25 26 27   GLUCOSE 97 95 170* 199*  BUN 35* 26* 18 18  CREATININE 2.22* 1.81* 1.35 1.18  CALCIUM 9.3 9.2 8.9 10.2  MG -- -- -- 1.5    Lab 06/12/11 1808  INR 1.07  PROTIME --    Recent Results (from the past 240 hour(s))  MRSA PCR SCREENING     Status: Normal   Collection Time   06/12/11  7:30 PM      Component Value Range Status Comment   MRSA by PCR NEGATIVE  NEGATIVE  Final   CULTURE, ROUTINE-ABSCESS     Status: Normal (  Preliminary result)   Collection Time   06/12/11 10:00 PM      Component Value Range Status Comment   Specimen Description ABSCESS LEG RIGHT   Final    Special Requests SPECIMEN A   Final    Gram Stain     Final    Value: ABUNDANT WBC PRESENT,BOTH PMN AND MONONUCLEAR     NO ORGANISMS SEEN     Performed at [facility]   Culture NO GROWTH 2 DAYS   Final    Report Status PENDING   Incomplete   ANAEROBIC CULTURE     Status: Normal (Preliminary result)   Collection Time   06/12/11 10:00 PM      Component Value Range Status Comment   Specimen Description ABSCESS LEG RIGHT   Final    Special Requests SPECIMEN A   Final    Gram Stain     Final    Value: ABUNDANT WBC PRESENT,BOTH PMN AND MONONUCLEAR     NO ORGANISMS SEEN     Performed at [facility]   Culture     Final    Value: NO ANAEROBES ISOLATED; CULTURE IN PROGRESS FOR 5 DAYS   Report Status PENDING   Incomplete   GRAM STAIN     Status: Normal   Collection Time   06/12/11 10:00 PM      Component Value Range Status Comment   Specimen Description ABSCESS LEG RIGHT   Final     Special Requests SPECIMEN A   Final    Gram Stain     Final    Value: ABUNDANT WBC PRESENT,BOTH PMN AND MONONUCLEAR     NO ORGANISMS SEEN   Report Status 06/12/2011 FINAL   Final   AFB CULTURE WITH SMEAR     Status: Normal (Preliminary result)   Collection Time   06/12/11 10:00 PM      Component Value Range Status Comment   Specimen Description ABSCESS LEG RIGHT   Final    Special Requests SPECIMEN A   Final    ACID FAST SMEAR NO ACID FAST BACILLI SEEN   Final    Culture     Final    Value: CULTURE WILL BE EXAMINED FOR 6 WEEKS BEFORE ISSUING A FINAL REPORT   Report Status PENDING   Incomplete   TISSUE CULTURE     Status: Normal (Preliminary result)   Collection Time   06/12/11 10:04 PM      Component Value Range Status Comment   Specimen Description TISSUE TIBIA RIGHT   Final    Special Requests SPECIMEN B   Final    Gram Stain     Final    Value: ABUNDANT WBC PRESENT,BOTH PMN AND MONONUCLEAR     NO ORGANISMS SEEN     Performed at [facility]   Culture NO GROWTH 2 DAYS   Final    Report Status PENDING   Incomplete   ANAEROBIC CULTURE     Status: Normal (Preliminary result)   Collection Time   06/12/11 10:04 PM      Component Value Range Status Comment   Specimen Description TISSUE TIBIA RIGHT   Final    Special Requests SPECIMEN B   Final    Gram Stain     Final    Value: ABUNDANT WBC PRESENT,BOTH PMN AND MONONUCLEAR     NO ORGANISMS SEEN     Performed at [facility]   Culture     Final  Value: NO ANAEROBES ISOLATED; CULTURE IN PROGRESS FOR 5 DAYS   Report Status PENDING   Incomplete   AFB CULTURE WITH SMEAR     Status: Normal (Preliminary result)   Collection Time   06/12/11 10:04 PM      Component Value Range Status Comment   Specimen Description TISSUE TIBIA RIGHT   Final    Special Requests SPECIMEN B   Final    ACID FAST SMEAR NO ACID FAST BACILLI SEEN   Final    Culture     Final    Value: CULTURE WILL BE EXAMINED FOR 6 WEEKS BEFORE ISSUING A FINAL  REPORT   Report Status PENDING   Incomplete   GRAM STAIN     Status: Normal   Collection Time   06/12/11 10:04 PM      Component Value Range Status Comment   Specimen Description TISSUE TIBIA RIGHT   Final    Special Requests SPECIMEN B   Final    Gram Stain     Final    Value: ABUNDANT WBC PRESENT,BOTH PMN AND MONONUCLEAR     NO ORGANISMS SEEN   Report Status 06/12/2011 FINAL   Final     Studies/Results: Dg Chest Port 1 View  06/14/2011  *RADIOLOGY REPORT*  Clinical Data: Shortness of breath.  PORTABLE CHEST - 1 VIEW  Comparison: Plain film of the chest 02/04/2011 and 08/20/2010.  CT chest 08/22/2010.  Findings: The patient is status post CABG.  There is cardiomegaly and bilateral alveolar and interstitial disease.  No pneumothorax or pleural effusion.  IMPRESSION: Cardiomegaly and bilateral airspace disease likely due to pulmonary edema.  Original Report Authenticated By: Bernadene Bell. Maricela Curet, M.D.    Medications: Scheduled Meds:   . aspirin  325 mg Oral Daily  . atorvastatin  40 mg Oral q1800  . enoxaparin (LOVENOX) injection  0.5 mg/kg Subcutaneous Q24H  . ezetimibe  10 mg Oral Daily  . furosemide  80 mg Oral BID  . insulin NPH  110 Units Subcutaneous BID  . isosorbide mononitrate  15 mg Oral Daily  . metoprolol  50 mg Oral BID  . ondansetron      . piperacillin-tazobactam (ZOSYN)  IV  3.375 g Intravenous Q8H  . vancomycin  1,500 mg Intravenous Q12H  . DISCONTD: furosemide  80 mg Oral TID  . DISCONTD: isosorbide mononitrate  30 mg Oral Daily  . DISCONTD: lisinopril  5 mg Oral Daily  . DISCONTD: metoprolol  200 mg Oral BID  . DISCONTD: vancomycin  2,500 mg Intravenous Once   Continuous Infusions:  PRN Meds:.morphine injection, ondansetron, oxyCODONE-acetaminophen  Assessment/Plan:  Principal Problem:  *Osteomyelitis of knee region  - please note the above finding on MRI  - pt underwent biopsy, results still pending  - will follow up on cultures  - obtain CBC, CMET in  AM  - continue antibiotics   Active Problems:  Peripheral vascular disease  - chronic   Heart disease  - no recent 2 D ECHO on file  - will continue Aspirin   Obesity  - BMI > 40   Diabetes mellitus  - uncontrolled  - will continue home medication regimen   Hypertension  - will hold home medications given soft BP  - monitor vitals per floor protocol   ARF - will have to hold off on lasix temporarily, decreased the dose to qd and 40 mg PO  - BMP in AM - strict I's and O's  Hyperlipidemia  - stable  -  plan of care and diagnosis, diagnostic studies and test results were discussed with pt and family at bedside  - pt and family verbalized understanding   EDUCATION  - test results and diagnostic studies were discussed with patient  - patient erbalized the understanding  - questions were answered at the bedside and contact information was provided for additional questions or concerns    LOS: 3 days   MAGICK-Dee Paden 06/15/2011, 1:03 PM  [facility] Pager: [phone]

## 2011-06-16 ENCOUNTER — Inpatient Hospital Stay (HOSPITAL_COMMUNITY): Payer: Medicare PPO

## 2011-06-16 DIAGNOSIS — M869 Osteomyelitis, unspecified: Secondary | ICD-10-CM

## 2011-06-16 LAB — URINALYSIS, ROUTINE W REFLEX MICROSCOPIC
Bilirubin Urine: NEGATIVE
Glucose, UA: NEGATIVE mg/dL
Hgb urine dipstick: NEGATIVE
Ketones, ur: NEGATIVE mg/dL
pH: 5.5 (ref 5.0–8.0)

## 2011-06-16 LAB — BASIC METABOLIC PANEL
CO2: 24 mEq/L (ref 19–32)
Calcium: 9.1 mg/dL (ref 8.4–10.5)
GFR calc Af Amer: 50 mL/min — ABNORMAL LOW (ref >90)
Sodium: 138 mEq/L (ref 135–145)

## 2011-06-16 LAB — CBC
MCH: 26.9 pg (ref 26.0–34.0)
Platelets: 330 10*3/uL (ref 150–400)
RBC: 3.87 MIL/uL — ABNORMAL LOW (ref 4.22–5.81)
RDW: 14.5 % (ref 11.5–15.5)
WBC: 8.6 10*3/uL (ref 4.0–10.5)

## 2011-06-16 LAB — GLUCOSE, CAPILLARY: Glucose-Capillary: 117 mg/dL — ABNORMAL HIGH (ref 70–99)

## 2011-06-16 LAB — CULTURE, ROUTINE-ABSCESS: Culture: NO GROWTH

## 2011-06-16 LAB — TISSUE CULTURE

## 2011-06-16 MED ORDER — FUROSEMIDE 80 MG PO TABS
80.0000 mg | ORAL_TABLET | Freq: Every day | ORAL | Status: DC
  Administered 2011-06-16 – 2011-06-17 (×2): 80 mg via ORAL
  Filled 2011-06-16 (×2): qty 1

## 2011-06-16 MED ORDER — SODIUM CHLORIDE 0.9 % IJ SOLN
10.0000 mL | INTRAMUSCULAR | Status: DC | PRN
  Administered 2011-06-18: 10 mL

## 2011-06-16 MED ORDER — ZOLPIDEM TARTRATE 10 MG PO TABS
10.0000 mg | ORAL_TABLET | Freq: Every evening | ORAL | Status: DC | PRN
  Administered 2011-06-16: 10 mg via ORAL
  Filled 2011-06-16: qty 1

## 2011-06-16 MED ORDER — SODIUM CHLORIDE 0.9 % IJ SOLN: 10.0000 mL | Freq: Two times a day (BID) | INTRAMUSCULAR | Status: DC

## 2011-06-16 NOTE — Progress Notes (Signed)
INFECTIOUS DISEASE PROGRESS NOTE  ID: Timothy Trujillo is a 61 y.o. male with  Principal Problem:  *Osteomyelitis of knee region Active Problems:  Heart disease  Obesity  Peripheral vascular disease  Diabetes mellitus  Hypertension  Hyperlipidemia  Subjective: Without complaints  Abtx:  Anti-infectives     Start     Dose/Rate Route Frequency Ordered Stop   06/15/11 1400   cefTRIAXone (ROCEPHIN) 1 g in dextrose 5 % 50 mL IVPB        1 g 100 mL/hr over 30 Minutes Intravenous Every 24 hours 06/15/11 1310 07/23/11 1359   06/13/11 0800   vancomycin (VANCOCIN) 1,500 mg in sodium chloride 0.9 % 500 mL IVPB        1,500 mg 250 mL/hr over 120 Minutes Intravenous Every 12 hours 06/12/11 1927     06/12/11 2000   vancomycin (VANCOCIN) 2,500 mg in sodium chloride 0.9 % 500 mL IVPB  Status:  Discontinued        2,500 mg 250 mL/hr over 120 Minutes Intravenous  Once 06/12/11 1927 06/15/11 1027   06/12/11 2000   piperacillin-tazobactam (ZOSYN) IVPB 3.375 g  Status:  Discontinued        3.375 g 12.5 mL/hr over 240 Minutes Intravenous Every 8 hours 06/12/11 1927 06/15/11 1310          Medications:  Scheduled:   . aspirin  325 mg Oral Daily  . atorvastatin  40 mg Oral q1800  . cefTRIAXone (ROCEPHIN)  IV  1 g Intravenous Q24H  . enoxaparin (LOVENOX) injection  0.5 mg/kg Subcutaneous Q24H  . ezetimibe  10 mg Oral Daily  . furosemide  80 mg Oral Daily  . insulin NPH  110 Units Subcutaneous BID  . metoprolol  12.5 mg Oral BID  . sodium chloride  10-40 mL Intracatheter Q12H  . vancomycin  1,500 mg Intravenous Q12H  . DISCONTD: furosemide  40 mg Oral Daily  . DISCONTD: furosemide  80 mg Oral BID  . DISCONTD: isosorbide mononitrate  15 mg Oral Daily  . DISCONTD: metoprolol  50 mg Oral BID  . DISCONTD: piperacillin-tazobactam (ZOSYN)  IV  3.375 g Intravenous Q8H    Objective: Vital signs in last 24 hours: Temp:  [97.3 F (36.3 C)-97.9 F (36.6 C)] 97.9 F (36.6 C) (03/10  0520) Pulse Rate:  [80-90] 88  (03/10 0520) Resp:  [18-20] 20  (03/10 0520) BP: (99-131)/(66-81) 124/81 mmHg (03/10 0520) SpO2:  [98 %-100 %] 98 % (03/10 0520)   General appearance: alert, cooperative, no distress and morbidly obese Incision/Wound: clean, mild tenderness. No d/c.   Lab Results  Basename 06/16/11 0712 06/15/11 0630  WBC 8.6 10.4  HGB 10.4* 10.1*  HCT 32.3* 31.6*  NA 138 140  K 4.0 4.3  CL 101 99  CO2 24 28  BUN 34* 35*  CREATININE 1.65* 2.22*  GLU -- --   Liver Panel No results found for this basename: PROT:2,ALBUMIN:2,AST:2,ALT:2,ALKPHOS:2,BILITOT:2,BILIDIR:2,IBILI:2 in the last 72 hours Sedimentation Rate No results found for this basename: ESRSEDRATE in the last 72 hours C-Reactive Protein No results found for this basename: CRP:2 in the last 72 hours  Microbiology: Recent Results (from the past 240 hour(s))  MRSA PCR SCREENING     Status: Normal   Collection Time   06/12/11  7:30 PM      Component Value Range Status Comment   MRSA by PCR NEGATIVE  NEGATIVE  Final   CULTURE, ROUTINE-ABSCESS     Status: Normal   Collection  Time   06/12/11 10:00 PM      Component Value Range Status Comment   Specimen Description ABSCESS LEG RIGHT   Final    Special Requests SPECIMEN A   Final    Gram Stain     Final    Value: ABUNDANT WBC PRESENT,BOTH PMN AND MONONUCLEAR     NO ORGANISMS SEEN     Performed at [facility]   Culture NO GROWTH 3 DAYS   Final    Report Status 06/16/2011 FINAL   Final   ANAEROBIC CULTURE     Status: Normal (Preliminary result)   Collection Time   06/12/11 10:00 PM      Component Value Range Status Comment   Specimen Description ABSCESS LEG RIGHT   Final    Special Requests SPECIMEN A   Final    Gram Stain     Final    Value: ABUNDANT WBC PRESENT,BOTH PMN AND MONONUCLEAR     NO ORGANISMS SEEN     Performed at [facility]   Culture     Final    Value: NO ANAEROBES ISOLATED; CULTURE IN PROGRESS FOR 5 DAYS   Report  Status PENDING   Incomplete   GRAM STAIN     Status: Normal   Collection Time   06/12/11 10:00 PM      Component Value Range Status Comment   Specimen Description ABSCESS LEG RIGHT   Final    Special Requests SPECIMEN A   Final    Gram Stain     Final    Value: ABUNDANT WBC PRESENT,BOTH PMN AND MONONUCLEAR     NO ORGANISMS SEEN   Report Status 06/12/2011 FINAL   Final   AFB CULTURE WITH SMEAR     Status: Normal (Preliminary result)   Collection Time   06/12/11 10:00 PM      Component Value Range Status Comment   Specimen Description ABSCESS LEG RIGHT   Final    Special Requests SPECIMEN A   Final    ACID FAST SMEAR NO ACID FAST BACILLI SEEN   Final    Culture     Final    Value: CULTURE WILL BE EXAMINED FOR 6 WEEKS BEFORE ISSUING A FINAL REPORT   Report Status PENDING   Incomplete   TISSUE CULTURE     Status: Normal   Collection Time   06/12/11 10:04 PM      Component Value Range Status Comment   Specimen Description TISSUE TIBIA RIGHT   Final    Special Requests SPECIMEN B   Final    Gram Stain     Final    Value: ABUNDANT WBC PRESENT,BOTH PMN AND MONONUCLEAR     NO ORGANISMS SEEN     Performed at [facility]   Culture NO GROWTH 3 DAYS   Final    Report Status 06/16/2011 FINAL   Final   ANAEROBIC CULTURE     Status: Normal (Preliminary result)   Collection Time   06/12/11 10:04 PM      Component Value Range Status Comment   Specimen Description TISSUE TIBIA RIGHT   Final    Special Requests SPECIMEN B   Final    Gram Stain     Final    Value: ABUNDANT WBC PRESENT,BOTH PMN AND MONONUCLEAR     NO ORGANISMS SEEN     Performed at [facility]   Culture     Final    Value: NO ANAEROBES  ISOLATED; CULTURE IN PROGRESS FOR 5 DAYS   Report Status PENDING   Incomplete   AFB CULTURE WITH SMEAR     Status: Normal (Preliminary result)   Collection Time   06/12/11 10:04 PM      Component Value Range Status Comment   Specimen Description TISSUE TIBIA RIGHT   Final    Special  Requests SPECIMEN B   Final    ACID FAST SMEAR NO ACID FAST BACILLI SEEN   Final    Culture     Final    Value: CULTURE WILL BE EXAMINED FOR 6 WEEKS BEFORE ISSUING A FINAL REPORT   Report Status PENDING   Incomplete   GRAM STAIN     Status: Normal   Collection Time   06/12/11 10:04 PM      Component Value Range Status Comment   Specimen Description TISSUE TIBIA RIGHT   Final    Special Requests SPECIMEN B   Final    Gram Stain     Final    Value: ABUNDANT WBC PRESENT,BOTH PMN AND MONONUCLEAR     NO ORGANISMS SEEN   Report Status 06/12/2011 FINAL   Final     Studies/Results: No results found.   Assessment/Plan: Ostemyelitis, abscess  Bone Infarcts  ARF Day 5 anbx  Will change his anbx to ceftriaxone and vanco. Not sure he needs anaerobe coverage.  Place PIC, plan for 6 weeks of anbx  Cx is negative final.  From ID perspective can be d/c when his Cr is stable. If this continues to be elevated he may need, renal eval, u/s, and stop vanco and consider alternative rx (cubicin).  Please have him f/u in ID clinic at d/c  [facility] Infectious Diseases [phone] 06/16/2011, 12:04 PM

## 2011-06-16 NOTE — Progress Notes (Signed)
Physical Therapy Treatment Patient Details Name: Timothy Trujillo MRN: [id number] DOB: 06/01/1950 Today's Date: 06/16/2011  PT Assessment/Plan  PT - Assessment/Plan Comments on Treatment Session: Ther ex for strengthening only today due to pt already up in chair with nursing prior to arrival to room. Pt reports he is too short of breath to work on transfer training with [facility] maintanence right now. Reinforced education on importance on maintaining PWB with transfers for optimal healing.             PT Plan: Discharge plan remains appropriate;Frequency remains appropriate PT Frequency: Min 6X/week Recommendations for Other Services: OT consult Follow Up Recommendations: Home health PT;Supervision - Intermittent Equipment Recommended: None recommended by PT PT Goals  Acute Rehab PT Goals PT Transfer Goal: Bed to Chair/Chair to Bed - Progress: Progressing toward goal  PT Treatment Precautions/Restrictions  Restrictions Weight Bearing Restrictions: Yes RLE Weight Bearing: Partial weight bearing RLE Partial Weight Bearing Percentage or Pounds: 25%  Other Position/Activity Restrictions: limit activity to primarilty transfers per PA/MD orders  Mobility (including Balance) Bed Mobility Bed Mobility: No    Exercise  Total Joint Exercises Ankle Circles/Pumps: AROM;Both;10 reps;Seated Quad Sets: AROM;Both;10 reps;Seated Heel Slides: AAROM;Right;10 reps;Seated Hip ABduction/ADduction: AAROM;Right;10 reps;Seated Straight Leg Raises: AAROM;Right;10 reps;Seated End of Session PT - End of Session Activity Tolerance: Patient tolerated treatment well;Patient limited by fatigue (pt reports too SOB to work on transfers right now) Nurse Communication: Mobility status for transfers;Weight bearing status (reinforced PWB per RN reporting pt not maintaining with txfs) General Behavior During Session: [facility] for tasks performed Cognition: [facility] for tasks performed  [name] 06/16/2011, 2:30 PM  [name], PTA Office- [phone] Weekend pager- [phone]

## 2011-06-16 NOTE — Progress Notes (Signed)
Patient ID: Timothy Trujillo, male   DOB: [birth year], 61 y.o.   MRN: [id number]  Subjective: No events overnight. Patient denies chest pain, shortness of breath, abdominal pain.  Objective:  Vital signs in last 24 hours:  Filed Vitals:   06/15/11 0619 06/15/11 1410 06/15/11 2113 06/16/11 0520  BP: 105/70 99/66 131/67 124/81  Pulse: 89 80 90 88  Temp: 97.2 F (36.2 C) 97.4 F (36.3 C) 97.3 F (36.3 C) 97.9 F (36.6 C)  TempSrc:  Oral Oral Oral  Resp: 20 18 18 20   Height:      Weight:      SpO2: 100% 100% 100% 98%    Intake/Output from previous day:   Intake/Output Summary (Last 24 hours) at 06/16/11 0803 Last data filed at 06/16/11 0700  Gross per 24 hour  Intake    480 ml  Output   1325 ml  Net   -845 ml    Physical Exam: General: Alert, awake, oriented x3, in no acute distress. HEENT: No bruits, no goiter. Moist mucous membranes, no scleral icterus, no conjunctival pallor. Heart: Regular rate and rhythm, S1/S2 +, no murmurs, rubs, gallops. Lungs: Clear to auscultation bilaterally with bibasilar crackles. No wheezing, no rhonchi, no rales.  Abdomen: Soft, nontender, nondistended, positive bowel sounds. Extremities: No clubbing or cyanosis, no pitting edema,  positive pedal pulses. Neuro: Grossly nonfocal.  Lab Results:  Basic Metabolic Panel:    Component Value Date/Time   NA 140 06/15/2011 0630   K 4.3 06/15/2011 0630   CL 99 06/15/2011 0630   CO2 28 06/15/2011 0630   BUN 35* 06/15/2011 0630   CREATININE 2.22* 06/15/2011 0630   GLUCOSE 97 06/15/2011 0630   CALCIUM 9.3 06/15/2011 0630   CBC:    Component Value Date/Time   WBC 10.4 06/15/2011 0630   HGB 10.1* 06/15/2011 0630   HCT 31.6* 06/15/2011 0630   PLT 351 06/15/2011 0630   MCV 84.0 06/15/2011 0630   NEUTROABS 8.9* 08/25/2010 0528   LYMPHSABS 0.3* 08/25/2010 0528   MONOABS 0.4 08/25/2010 0528   EOSABS 0.1 08/25/2010 0528   BASOSABS 0.0 08/25/2010 0528      Lab 06/15/11 0630 06/14/11 0745 06/13/11 0605 06/12/11 1808  WBC  10.4 10.4 8.4 9.1  HGB 10.1* 10.0* 10.5* 11.4*  HCT 31.6* 31.1* 33.1* 34.7*  PLT 351 330 288 350  MCV 84.0 84.3 84.0 82.6  MCH 26.9 27.1 26.6 27.1  MCHC 32.0 32.2 31.7 32.9  RDW 14.7 14.7 14.5 14.4  LYMPHSABS -- -- -- --  MONOABS -- -- -- --  EOSABS -- -- -- --  BASOSABS -- -- -- --  BANDABS -- -- -- --    Lab 06/15/11 0630 06/14/11 0745 06/13/11 0605 06/12/11 1808  NA 140 138 137 138  K 4.3 3.9 3.8 3.9  CL 99 100 99 99  CO2 28 25 26 27   GLUCOSE 97 95 170* 199*  BUN 35* 26* 18 18  CREATININE 2.22* 1.81* 1.35 1.18  CALCIUM 9.3 9.2 8.9 10.2  MG -- -- -- 1.5    Lab 06/12/11 1808  INR 1.07  PROTIME --   Cardiac markers: No results found for this basename: CK:3,CKMB:3,TROPONINI:3,MYOGLOBIN:3 in the last 168 hours No components found with this basename: POCBNP:3 Recent Results (from the past 240 hour(s))  MRSA PCR SCREENING     Status: Normal   Collection Time   06/12/11  7:30 PM      Component Value Range Status Comment   MRSA by  PCR NEGATIVE  NEGATIVE  Final   CULTURE, ROUTINE-ABSCESS     Status: Normal   Collection Time   06/12/11 10:00 PM      Component Value Range Status Comment   Specimen Description ABSCESS LEG RIGHT   Final    Special Requests SPECIMEN A   Final    Gram Stain     Final    Value: ABUNDANT WBC PRESENT,BOTH PMN AND MONONUCLEAR     NO ORGANISMS SEEN     Performed at [facility]   Culture NO GROWTH 3 DAYS   Final    Report Status 06/16/2011 FINAL   Final   ANAEROBIC CULTURE     Status: Normal (Preliminary result)   Collection Time   06/12/11 10:00 PM      Component Value Range Status Comment   Specimen Description ABSCESS LEG RIGHT   Final    Special Requests SPECIMEN A   Final    Gram Stain     Final    Value: ABUNDANT WBC PRESENT,BOTH PMN AND MONONUCLEAR     NO ORGANISMS SEEN     Performed at [facility]   Culture     Final    Value: NO ANAEROBES ISOLATED; CULTURE IN PROGRESS FOR 5 DAYS   Report Status PENDING   Incomplete     GRAM STAIN     Status: Normal   Collection Time   06/12/11 10:00 PM      Component Value Range Status Comment   Specimen Description ABSCESS LEG RIGHT   Final    Special Requests SPECIMEN A   Final    Gram Stain     Final    Value: ABUNDANT WBC PRESENT,BOTH PMN AND MONONUCLEAR     NO ORGANISMS SEEN   Report Status 06/12/2011 FINAL   Final   AFB CULTURE WITH SMEAR     Status: Normal (Preliminary result)   Collection Time   06/12/11 10:00 PM      Component Value Range Status Comment   Specimen Description ABSCESS LEG RIGHT   Final    Special Requests SPECIMEN A   Final    ACID FAST SMEAR NO ACID FAST BACILLI SEEN   Final    Culture     Final    Value: CULTURE WILL BE EXAMINED FOR 6 WEEKS BEFORE ISSUING A FINAL REPORT   Report Status PENDING   Incomplete   TISSUE CULTURE     Status: Normal   Collection Time   06/12/11 10:04 PM      Component Value Range Status Comment   Specimen Description TISSUE TIBIA RIGHT   Final    Special Requests SPECIMEN B   Final    Gram Stain     Final    Value: ABUNDANT WBC PRESENT,BOTH PMN AND MONONUCLEAR     NO ORGANISMS SEEN     Performed at [facility]   Culture NO GROWTH 3 DAYS   Final    Report Status 06/16/2011 FINAL   Final   ANAEROBIC CULTURE     Status: Normal (Preliminary result)   Collection Time   06/12/11 10:04 PM      Component Value Range Status Comment   Specimen Description TISSUE TIBIA RIGHT   Final    Special Requests SPECIMEN B   Final    Gram Stain     Final    Value: ABUNDANT WBC PRESENT,BOTH PMN AND MONONUCLEAR     NO ORGANISMS SEEN  Performed at [facility]   Culture     Final    Value: NO ANAEROBES ISOLATED; CULTURE IN PROGRESS FOR 5 DAYS   Report Status PENDING   Incomplete   AFB CULTURE WITH SMEAR     Status: Normal (Preliminary result)   Collection Time   06/12/11 10:04 PM      Component Value Range Status Comment   Specimen Description TISSUE TIBIA RIGHT   Final    Special Requests SPECIMEN B   Final     ACID FAST SMEAR NO ACID FAST BACILLI SEEN   Final    Culture     Final    Value: CULTURE WILL BE EXAMINED FOR 6 WEEKS BEFORE ISSUING A FINAL REPORT   Report Status PENDING   Incomplete   GRAM STAIN     Status: Normal   Collection Time   06/12/11 10:04 PM      Component Value Range Status Comment   Specimen Description TISSUE TIBIA RIGHT   Final    Special Requests SPECIMEN B   Final    Gram Stain     Final    Value: ABUNDANT WBC PRESENT,BOTH PMN AND MONONUCLEAR     NO ORGANISMS SEEN   Report Status 06/12/2011 FINAL   Final     Studies/Results: No results found.  Medications: Scheduled Meds:   . aspirin  325 mg Oral Daily  . atorvastatin  40 mg Oral q1800  . cefTRIAXone (ROCEPHIN)  IV  1 g Intravenous Q24H  . enoxaparin (LOVENOX) injection  0.5 mg/kg Subcutaneous Q24H  . ezetimibe  10 mg Oral Daily  . furosemide  40 mg Oral Daily  . insulin NPH  110 Units Subcutaneous BID  . metoprolol  12.5 mg Oral BID  . vancomycin  1,500 mg Intravenous Q12H  . DISCONTD: furosemide  80 mg Oral BID  . DISCONTD: isosorbide mononitrate  15 mg Oral Daily  . DISCONTD: metoprolol  50 mg Oral BID  . DISCONTD: piperacillin-tazobactam (ZOSYN)  IV  3.375 g Intravenous Q8H  . DISCONTD: vancomycin  2,500 mg Intravenous Once   Continuous Infusions:  PRN Meds:.morphine injection, ondansetron, oxyCODONE-acetaminophen  Assessment/Plan:  Principal Problem:  *Osteomyelitis of knee region  - please note the above finding on MRI  - will follow up on cultures  - obtain CBC, CMET in AM  - continue antibiotics as per ID recommendations  Active Problems:  ARF - unclear etiology but possibly related to Lasix vs Vancomycin - dose of Lasix was decreased but will have to follow up on AM labs from today - will proceed with work up: urine Na, and urine Cr, renal US, I's and O's - continue home dose lasix for now - BMP in AM  Peripheral vascular disease  - chronic   Heart disease  - no recent 2 D ECHO  on file  - will continue Aspirin   Obesity  - BMI > 40   Diabetes mellitus  - uncontrolled  - will continue home medication regimen  - obtain A1C   Hypertension  - will hold home medications given soft BP  - monitor vitals per floor protocol   Hyperlipidemia  - stable  - plan of care and diagnosis, diagnostic studies and test results were discussed with pt and family at bedside  - pt and family verbalized understanding   EDUCATION  - test results and diagnostic studies were discussed with patient  - patient erbalized the understanding  - questions were answered at  the bedside and contact information was provided for additional questions or concerns    LOS: 4 days   MAGICK-Burel Kahre 06/16/2011, 8:03 AM  [facility] Pager: [phone]

## 2011-06-16 NOTE — Progress Notes (Signed)
Patient ID: Timothy Trujillo, male   DOB: 08-Oct-1950, 61 y.o.   MRN: [id number] Subjective: 4 Days Post-Op Procedure(s) (LRB): IRRIGATION AND DEBRIDEMENT EXTREMITY (Right)    Patient reports pain as mild.  Objective:   VITALS:   Filed Vitals:   06/16/11 0520  BP: 124/81  Pulse: 88  Temp: 97.9 F (36.6 C)  Resp: 20    Neurovascular intact Incision: dressing C/D/I.  Drain removed today, no significant drainage, dressing changed, minor erythema  LABS  Basename 06/15/11 0630 06/14/11 0745  HGB 10.1* 10.0*  HCT 31.6* 31.1*  WBC 10.4 10.4  PLT 351 330     Basename 06/15/11 0630 06/14/11 0745  NA 140 138  K 4.3 3.9  BUN 35* 26*  CREATININE 2.22* 1.81*  GLUCOSE 97 95    No results found for this basename: LABPT:2,INR:2 in the last 72 hours   Assessment/Plan: 4 Days Post-Op Procedure(s) (LRB): IRRIGATION AND DEBRIDEMENT EXTREMITY (Right)   Advance diet Continue ABX therapy due to Culture taken of surgical site.  Diagnosis of osteomyelitis, will require IV antibiotics for extended period of time. PIC line to be placed today? D/C pending appropriate arrangements

## 2011-06-17 LAB — GLUCOSE, CAPILLARY
Glucose-Capillary: 195 mg/dL — ABNORMAL HIGH (ref 70–99)
Glucose-Capillary: 213 mg/dL — ABNORMAL HIGH (ref 70–99)
Glucose-Capillary: 211 mg/dL — ABNORMAL HIGH (ref 70–99)

## 2011-06-17 LAB — ANAEROBIC CULTURE

## 2011-06-17 LAB — BASIC METABOLIC PANEL
BUN: 29 mg/dL — ABNORMAL HIGH (ref 6–23)
Chloride: 101 mEq/L (ref 96–112)
Glucose, Bld: 201 mg/dL — ABNORMAL HIGH (ref 70–99)
Potassium: 4 mEq/L (ref 3.5–5.1)
Sodium: 137 mEq/L (ref 135–145)

## 2011-06-17 LAB — CBC
HCT: 30.4 % — ABNORMAL LOW (ref 39.0–52.0)
Hemoglobin: 9.6 g/dL — ABNORMAL LOW (ref 13.0–17.0)
RBC: 3.61 MIL/uL — ABNORMAL LOW (ref 4.22–5.81)
WBC: 8.3 10*3/uL (ref 4.0–10.5)

## 2011-06-17 MED ORDER — FUROSEMIDE 80 MG PO TABS
80.0000 mg | ORAL_TABLET | Freq: Two times a day (BID) | ORAL | Status: DC
  Administered 2011-06-18: 80 mg via ORAL
  Filled 2011-06-17 (×3): qty 1

## 2011-06-17 MED ORDER — VANCOMYCIN HCL 1000 MG IV SOLR
1750.0000 mg | Freq: Two times a day (BID) | INTRAVENOUS | Status: DC
  Administered 2011-06-17 – 2011-06-18 (×2): 1750 mg via INTRAVENOUS
  Filled 2011-06-17 (×4): qty 1750

## 2011-06-17 NOTE — Progress Notes (Signed)
Inpatient Diabetes Program Recommendations  AACE/ADA: New Consensus Statement on Inpatient Glycemic Control (2009)  Target Ranges:  Prepandial:   less than 140 mg/dL      Peak postprandial:   less than 180 mg/dL (1-2 hours)      Critically ill patients:  140 - 180 mg/dL   Reason for Visit: Hyperglycemia.    Inpatient Diabetes Program Recommendations Correction (SSI): Request MD order for correction scale tid with meals.  Needs order for CBGs at least ac & HS  Note: CBG order is currently for every morning.  According to the American Diabetes Association Guidelines for care of the hospitalized patient with diabetes, CBG's need to be checked a minimum of four times a day.  Thank you.

## 2011-06-17 NOTE — Progress Notes (Deleted)
06/17/2011 Timothy Trujillo, Bosie Clos SPARKS Case Management Note [phone]     In to speak with patient regarding recommendation for home health services and to offer choice of agencies. Patient refused home health care services. States, " I just don't think I need them." Does report to case manager that he has been having difficulty obtaining medications.

## 2011-06-17 NOTE — Progress Notes (Signed)
Subjective: 5 Days Post-Op Procedure(s) (LRB): IRRIGATION AND DEBRIDEMENT EXTREMITY (Right) Patient reports pain as mild.   Denies CP or SOB.  Voiding without difficulty. Positive flatus. Objective: Vital signs in last 24 hours: Temp:  [97.6 F (36.4 C)-98.4 F (36.9 C)] 98.4 F (36.9 C) (03/11 1345) Pulse Rate:  [93-101] 101  (03/11 1345) Resp:  [18] 18  (03/11 1345) BP: (104-131)/(58-87) 125/77 mmHg (03/11 1345) SpO2:  [96 %-99 %] 97 % (03/11 1345)  Intake/Output from previous day: 03/10 0701 - 03/11 0700 In: 2500 [IV Piggyback:2500] Out: 350 [Urine:350] Intake/Output this shift: Total I/O In: 960 [P.O.:960] Out: 1250 [Urine:1250]   Basename 06/17/11 0515 06/16/11 0712 06/15/11 0630  HGB 9.6* 10.4* 10.1*    Basename 06/17/11 0515 06/16/11 0712  WBC 8.3 8.6  RBC 3.61* 3.87*  HCT 30.4* 32.3*  PLT 350 330    Basename 06/17/11 0515 06/16/11 0712  NA 137 138  K 4.0 4.0  CL 101 101  CO2 27 24  BUN 29* 34*  CREATININE 1.44* 1.65*  GLUCOSE 201* 116*  CALCIUM 9.2 9.1   No results found for this basename: LABPT:2,INR:2 in the last 72 hours  Neurologically intact Neurovascular intact Sensation intact distally Incision: scant drainage and mild erythema Compartment soft  Assessment/Plan: 5 Days Post-Op Procedure(s) (LRB): IRRIGATION AND DEBRIDEMENT EXTREMITY (Right) Patient much improved.  Cont 6 weeks IV antibiotics per ID Will see Dr Shelle Iron in 10 days for staple removal Stable for d/c from ortho standpoint will sign off at this time  Timothy Terris R. 06/17/2011, 2:20 PM

## 2011-06-17 NOTE — Progress Notes (Addendum)
Physical Therapy Treatment Patient Details Name: Timothy Trujillo MRN: [id number] DOB: 12-04-1950 Today's Date: 06/17/2011  PT Assessment/Plan  PT - Assessment/Plan Comments on Treatment Session: Overall managing transfers well, though continues to need cues/work on PWB status; entire session conducted on 3 liters supplemental O2 PT Plan: Discharge plan remains appropriate;Frequency remains appropriate PT Frequency: Min 6X/week Recommendations for Other Services: OT consult Follow Up Recommendations: Home health PT;Supervision - Intermittent Equipment Recommended: None recommended by PT PT Goals  Acute Rehab PT Goals Time For Goal Achievement: 7 days Pt will go Supine/Side to Sit: Independently PT Goal: Supine/Side to Sit - Progress: Progressing toward goal Pt will go Sit to Stand: with modified independence;with upper extremity assist PT Goal: Sit to Stand - Progress: Progressing toward goal Pt will go Stand to Sit: with modified independence PT Goal: Stand to Sit - Progress: Progressing toward goal Pt will Transfer Bed to Chair/Chair to Bed: with modified independence PT Transfer Goal: Bed to Chair/Chair to Bed - Progress: Progressing toward goal  PT Treatment Precautions/Restrictions  Restrictions Weight Bearing Restrictions: Yes RLE Weight Bearing: Partial weight bearing RLE Partial Weight Bearing Percentage or Pounds: 25-50% Other Position/Activity Restrictions: limit activity to primarilty transfers per PA/MD orders Mobility (including Balance) Bed Mobility Bed Mobility: Yes Supine to Sit: 5: Supervision;With rails Supine to Sit Details (indicate cue type and reason): Continued safety cues Transfers Transfers: Yes Sit to Stand: 4: Min assist (Second person present for safety) Sit to Stand Details (indicate cue type and reason): continued need for cues for PWB; Pt moving quickly/impulsivley, it is apparent he is likley putting more weight on RLE -- cues to not put more than  50% through RLE Stand to Sit: 3: Mod assist;With armrests;To chair/3-in-1 Stand to Sit Details: Cues to control descent, and for hand placement Stand Pivot Transfers: 4: Min assist Stand Pivot Transfer Details (indicate cue type and reason): continued cues for PWB; also cues to align self with recliner prior to sitting Ambulation/Gait Ambulation/Gait: No Stairs: No       End of Session PT - End of Session Equipment Utilized During Treatment: Gait belt Activity Tolerance: Patient limited by fatigue (SOB upon arrival; pt agreeable to OOB) Patient left: in chair;with call bell in reach;Other (comment) Nurse Communication: Mobility status for transfers;Weight bearing status General Behavior During Session: [facility] for tasks performed Cognition: [facility] for tasks performed (somewhat impulsive today)  [name] [place], [place] [phone]  06/17/2011, 12:57 PM

## 2011-06-17 NOTE — Progress Notes (Signed)
06/17/2011 Fransico Michael SPARKS Case Management Note [phone]  HOME HEALTH AGENCIES SERVING [facility]   Agencies that are Medicare-Certified and are affiliated with [facility]  Telephone Number Address  [facility]   The [facility] has ownership interest in this company; however, you are under no obligation to use this agency. [phone] or  [phone] [address] [place], [place] [address]   Agencies that are Medicare-Certified and are not affiliated with [facility] Agency Telephone Number Address  [facility] [phone] Fax [phone] [address], [address] [place], [place]  [address]  [facility] [phone] or [phone] Fax [phone] [address] [place], [place] [address]  [facility] [phone] Fax [phone] [address] [place], [place] [address]  [facility] [phone] Fax [phone] [address] [place], [place]  [address]  [facility] [phone] Fax [phone] [address] Suite [place], [place] [address]  [facility] [phone] [address] [place], [place] [address]  [facility] [phone]  [address] [place], [place] [address]  [facility] [phone] or [phone] Fax [phone] [address] [place], [place]  [address]  [facility] [phone] Fax [phone] [address] [place], [place]  [address]  [facility]  [phone] Fax [phone] [address] [place], [place] [address]               Agencies that are not Medicare-Certified and are not affiliated with [facility] Agency Telephone Number Address  [facility] [place] [phone] or [phone] Fax [phone] [address] [place]  [address]  [facility] [phone] Fax [phone] [address] [place], [place]  [address]  [facility]  [phone] Fax [phone] [address] [place], [place] [address]  [facility] [phone] Fax [phone] [address] [place], [place] [address]  [facility] [phone] or [phone] Fax [phone] [address] [place], [place]  [address]  [facility] [phone] or [phone] Fax [phone] [address] Suite [place], [place]  [address]  [facility] [phone] Fax [phone] [address] [place], [place]  [address]  [facility] [phone] [address] [place], [place]  [address]  [facility] [phone] Fax [phone] [address] [place], [place]  [address]  [facility] [phone] Fax [phone] [address] [place], [place]  [address]  [facility] [phone] Fax [phone] [address] [place], [place] [address]  [facility] [phone] Fax [phone] [address] [place], [place]  [address]   In to speak with patient regarding home health PT and  RN for IV antibiotics. Choice of agencies given to patient. [facility] having [facility] past and would like to have them again. Corrie Dandy, RN with [facility] notified of referral.

## 2011-06-17 NOTE — Progress Notes (Signed)
Patient ID: Timothy Trujillo, male   DOB: [birth year], 61 y.o.   MRN: [id number]  Subjective: No events overnight. Patient denies chest pain, shortness of breath, abdominal pain.   Objective:  Vital signs in last 24 hours:  Filed Vitals:   06/16/11 2100 06/17/11 0701 06/17/11 1117 06/17/11 1345  BP: 109/58 131/87 104/71 125/77  Pulse: 93 97 99 101  Temp: 97.6 F (36.4 C) 98.4 F (36.9 C)  98.4 F (36.9 C)  TempSrc:    Oral  Resp: 18 18  18   Height:      Weight:      SpO2: 96% 99%  97%    Intake/Output from previous day:   Intake/Output Summary (Last 24 hours) at 06/17/11 1637 Last data filed at 06/17/11 1500  Gross per 24 hour  Intake   3460 ml  Output   1950 ml  Net   1510 ml    Physical Exam: General: Alert, awake, oriented x3, in no acute distress. HEENT: No bruits, no goiter. Moist mucous membranes, no scleral icterus, no conjunctival pallor. Heart: Regular rate and rhythm, S1/S2 +, no murmurs, rubs, gallops. Lungs: Clear to auscultation bilaterally. No wheezing, no rhonchi, no rales.  Abdomen: Soft, nontender, nondistended, positive bowel sounds. Extremities: No clubbing or cyanosis, no pitting edema,  positive pedal pulses. Neuro: Grossly nonfocal.  Lab Results:  Basic Metabolic Panel:    Component Value Date/Time   NA 137 06/17/2011 0515   K 4.0 06/17/2011 0515   CL 101 06/17/2011 0515   CO2 27 06/17/2011 0515   BUN 29* 06/17/2011 0515   CREATININE 1.44* 06/17/2011 0515   GLUCOSE 201* 06/17/2011 0515   CALCIUM 9.2 06/17/2011 0515   CBC:    Component Value Date/Time   WBC 8.3 06/17/2011 0515   HGB 9.6* 06/17/2011 0515   HCT 30.4* 06/17/2011 0515   PLT 350 06/17/2011 0515   MCV 84.2 06/17/2011 0515   NEUTROABS 8.9* 08/25/2010 0528   LYMPHSABS 0.3* 08/25/2010 0528   MONOABS 0.4 08/25/2010 0528   EOSABS 0.1 08/25/2010 0528   BASOSABS 0.0 08/25/2010 0528      Lab 06/17/11 0515 06/16/11 0712 06/15/11 0630 06/14/11 0745 06/13/11 0605  WBC 8.3 8.6 10.4 10.4 8.4    HGB 9.6* 10.4* 10.1* 10.0* 10.5*  HCT 30.4* 32.3* 31.6* 31.1* 33.1*  PLT 350 330 351 330 288  MCV 84.2 83.5 84.0 84.3 84.0  MCH 26.6 26.9 26.9 27.1 26.6  MCHC 31.6 32.2 32.0 32.2 31.7  RDW 14.6 14.5 14.7 14.7 14.5  LYMPHSABS -- -- -- -- --  MONOABS -- -- -- -- --  EOSABS -- -- -- -- --  BASOSABS -- -- -- -- --  BANDABS -- -- -- -- --    Lab 06/17/11 0515 06/16/11 0712 06/15/11 0630 06/14/11 0745 06/13/11 0605 06/12/11 1808  NA 137 138 140 138 137 --  K 4.0 4.0 4.3 3.9 3.8 --  CL 101 101 99 100 99 --  CO2 27 24 28 25 26  --  GLUCOSE 201* 116* 97 95 170* --  BUN 29* 34* 35* 26* 18 --  CREATININE 1.44* 1.65* 2.22* 1.81* 1.35 --  CALCIUM 9.2 9.1 9.3 9.2 8.9 --  MG -- -- -- -- -- 1.5    Lab 06/12/11 1808  INR 1.07  PROTIME --   Cardiac markers: No results found for this basename: CK:3,CKMB:3,TROPONINI:3,MYOGLOBIN:3 in the last 168 hours No components found with this basename: POCBNP:3 Recent Results (from the past 240 hour(s))  MRSA  PCR SCREENING     Status: Normal   Collection Time   06/12/11  7:30 PM      Component Value Range Status Comment   MRSA by PCR NEGATIVE  NEGATIVE  Final   CULTURE, ROUTINE-ABSCESS     Status: Normal   Collection Time   06/12/11 10:00 PM      Component Value Range Status Comment   Specimen Description ABSCESS LEG RIGHT   Final    Special Requests SPECIMEN A   Final    Gram Stain     Final    Value: ABUNDANT WBC PRESENT,BOTH PMN AND MONONUCLEAR     NO ORGANISMS SEEN     Performed at [facility]   Culture NO GROWTH 3 DAYS   Final    Report Status 06/16/2011 FINAL   Final   ANAEROBIC CULTURE     Status: Normal   Collection Time   06/12/11 10:00 PM      Component Value Range Status Comment   Specimen Description ABSCESS LEG RIGHT   Final    Special Requests SPECIMEN A   Final    Gram Stain     Final    Value: ABUNDANT WBC PRESENT,BOTH PMN AND MONONUCLEAR     NO ORGANISMS SEEN     Performed at [facility]   Culture NO ANAEROBES  ISOLATED   Final    Report Status 06/17/2011 FINAL   Final   GRAM STAIN     Status: Normal   Collection Time   06/12/11 10:00 PM      Component Value Range Status Comment   Specimen Description ABSCESS LEG RIGHT   Final    Special Requests SPECIMEN A   Final    Gram Stain     Final    Value: ABUNDANT WBC PRESENT,BOTH PMN AND MONONUCLEAR     NO ORGANISMS SEEN   Report Status 06/12/2011 FINAL   Final   AFB CULTURE WITH SMEAR     Status: Normal (Preliminary result)   Collection Time   06/12/11 10:00 PM      Component Value Range Status Comment   Specimen Description ABSCESS LEG RIGHT   Final    Special Requests SPECIMEN A   Final    ACID FAST SMEAR NO ACID FAST BACILLI SEEN   Final    Culture     Final    Value: CULTURE WILL BE EXAMINED FOR 6 WEEKS BEFORE ISSUING A FINAL REPORT   Report Status PENDING   Incomplete   TISSUE CULTURE     Status: Normal   Collection Time   06/12/11 10:04 PM      Component Value Range Status Comment   Specimen Description TISSUE TIBIA RIGHT   Final    Special Requests SPECIMEN B   Final    Gram Stain     Final    Value: ABUNDANT WBC PRESENT,BOTH PMN AND MONONUCLEAR     NO ORGANISMS SEEN     Performed at [facility]   Culture NO GROWTH 3 DAYS   Final    Report Status 06/16/2011 FINAL   Final   ANAEROBIC CULTURE     Status: Normal   Collection Time   06/12/11 10:04 PM      Component Value Range Status Comment   Specimen Description TISSUE TIBIA RIGHT   Final    Special Requests SPECIMEN B   Final    Gram Stain     Final  Value: ABUNDANT WBC PRESENT,BOTH PMN AND MONONUCLEAR     NO ORGANISMS SEEN     Performed at [facility]   Culture NO ANAEROBES ISOLATED   Final    Report Status 06/17/2011 FINAL   Final   AFB CULTURE WITH SMEAR     Status: Normal (Preliminary result)   Collection Time   06/12/11 10:04 PM      Component Value Range Status Comment   Specimen Description TISSUE TIBIA RIGHT   Final    Special Requests SPECIMEN B   Final      ACID FAST SMEAR NO ACID FAST BACILLI SEEN   Final    Culture     Final    Value: CULTURE WILL BE EXAMINED FOR 6 WEEKS BEFORE ISSUING A FINAL REPORT   Report Status PENDING   Incomplete   GRAM STAIN     Status: Normal   Collection Time   06/12/11 10:04 PM      Component Value Range Status Comment   Specimen Description TISSUE TIBIA RIGHT   Final    Special Requests SPECIMEN B   Final    Gram Stain     Final    Value: ABUNDANT WBC PRESENT,BOTH PMN AND MONONUCLEAR     NO ORGANISMS SEEN   Report Status 06/12/2011 FINAL   Final     Studies/Results: US Renal  06/16/2011  *RADIOLOGY REPORT*  Clinical Data: Acute renal failure.  RENAL/URINARY TRACT ULTRASOUND COMPLETE  Comparison:  None.  Findings:  Right Kidney:  15.6 cm in length.  Normal renal cortical thickness and echogenicity without focal lesions or hydronephrosis.  No focal lesions.  Left Kidney:  15.4 cm in length. Normal renal cortical thickness and echogenicity without focal lesions or hydronephrosis.  Bladder:  Empty.  IMPRESSION: No hydronephrosis.  Original Report Authenticated By: P. Loralie Champagne, M.D.    Medications: Scheduled Meds:   . aspirin  325 mg Oral Daily  . atorvastatin  40 mg Oral q1800  . cefTRIAXone (ROCEPHIN)  IV  1 g Intravenous Q24H  . enoxaparin (LOVENOX) injection  0.5 mg/kg Subcutaneous Q24H  . ezetimibe  10 mg Oral Daily  . furosemide  80 mg Oral Daily  . insulin NPH  110 Units Subcutaneous BID  . metoprolol  12.5 mg Oral BID  . sodium chloride  10-40 mL Intracatheter Q12H  . vancomycin  1,750 mg Intravenous Q12H  . DISCONTD: vancomycin  1,500 mg Intravenous Q12H   Continuous Infusions:  PRN Meds:.morphine injection, ondansetron, oxyCODONE-acetaminophen, sodium chloride, zolpidem  Assessment/Plan:  Principal Problem:  *Osteomyelitis of knee region  - please note the above finding on MRI  - will follow up on cultures  - obtain CBC, CMET in AM  - continue antibiotics as per ID recommendations    Active Problems:  ARF  - unclear etiology but possibly related to Lasix vs Vancomycin  - dose of Lasix was decreased but will have to follow up on AM labs from today  - will proceed with work up: urine Na, and urine Cr, renal US, I's and O's  - continue home dose lasix for now  - BMP in AM   Peripheral vascular disease  - chronic   Heart disease  - no recent 2 D ECHO on file  - will continue Aspirin   Obesity  - BMI > 40   Diabetes mellitus  - uncontrolled  - will continue home medication regimen  - obtain A1C   Hypertension  - will hold  home medications given soft BP  - monitor vitals per floor protocol   Hyperlipidemia  - stable  - plan of care and diagnosis, diagnostic studies and test results were discussed with pt and family at bedside  - pt and family verbalized understanding   EDUCATION  - test results and diagnostic studies were discussed with patient  - patient erbalized the understanding  - questions were answered at the bedside and contact information was provided for additional questions or concerns    LOS: 5 days   MAGICK-Betzayda Braxton 06/17/2011, 4:37 PM  [facility] Pager: [phone]

## 2011-06-17 NOTE — Progress Notes (Signed)
ANTIBIOTIC CONSULT NOTE - FOLLOW UP  Pharmacy Consult for Vanco/Zosyn Indication: Knee infection  Allergies  Allergen Reactions  . Stadol (Butorphanol Tartrate) Other (See Comments)    "makes him crazy"    Patient Measurements: Height: 6\' 3"  (190.5 cm) Weight: 329 lb (149.233 kg) IBW/kg (Calculated) : 84.5  Adjusted Body Weight: 110 kg  Vital Signs: Temp: 98.4 F (36.9 C) (03/11 0701) BP: 131/87 mmHg (03/11 0701) Pulse Rate: 97  (03/11 0701) Intake/Output from previous day: 03/10 0701 - 03/11 0700 In: 2500 [IV Piggyback:2500] Out: 350 [Urine:350] Intake/Output from this shift:    Labs:  Basename 06/17/11 0515 06/16/11 0712 06/15/11 0630  WBC 8.3 8.6 10.4  HGB 9.6* 10.4* 10.1*  PLT 350 330 351  LABCREA -- -- --  CREATININE 1.44* 1.65* 2.22*   Estimated Creatinine Clearance: 83.1 ml/min (by C-G formula based on Cr of 1.44). No results found for this basename: VANCOTROUGH:2,VANCOPEAK:2,VANCORANDOM:2,GENTTROUGH:2,GENTPEAK:2,GENTRANDOM:2,TOBRATROUGH:2,TOBRAPEAK:2,TOBRARND:2,AMIKACINPEAK:2,AMIKACINTROU:2,AMIKACIN:2, in the last 72 hours     Assessment: 2 YOM with Swelling of the right knee area, MIR (2/26) positive for osteomyelitis with possible bone infarction, s/p bone biopsy. Scr has been trending down over the last couple days (2.22>>1.44). CrCl calculated around 80. Afebrile and culture negative. Patient is noted for 6 weeks of antibiotics (vanc/rocephin)  Goal of Therapy:  Vancomycin trough level 15-20 mcg/ml  Plan:  -Change vancomycin to 1750mg  IV q12h.  Benny Lennert 06/17/2011,8:51 AM

## 2011-06-18 LAB — GLUCOSE, CAPILLARY
Glucose-Capillary: 163 mg/dL — ABNORMAL HIGH (ref 70–99)
Glucose-Capillary: 168 mg/dL — ABNORMAL HIGH (ref 70–99)
Glucose-Capillary: 194 mg/dL — ABNORMAL HIGH (ref 70–99)

## 2011-06-18 LAB — BASIC METABOLIC PANEL
BUN: 21 mg/dL (ref 6–23)
Creatinine, Ser: 1.12 mg/dL (ref 0.50–1.35)
GFR calc Af Amer: 80 mL/min — ABNORMAL LOW (ref >90)
GFR calc non Af Amer: 69 mL/min — ABNORMAL LOW (ref >90)

## 2011-06-18 LAB — CBC
HCT: 30.5 % — ABNORMAL LOW (ref 39.0–52.0)
MCH: 26.6 pg (ref 26.0–34.0)
MCHC: 32.1 g/dL (ref 30.0–36.0)
MCV: 82.9 fL (ref 78.0–100.0)
RDW: 14.6 % (ref 11.5–15.5)

## 2011-06-18 MED ORDER — POTASSIUM CHLORIDE CRYS ER 20 MEQ PO TBCR
40.0000 meq | EXTENDED_RELEASE_TABLET | Freq: Once | ORAL | Status: AC
  Administered 2011-06-18: 40 meq via ORAL
  Filled 2011-06-18: qty 2

## 2011-06-18 MED ORDER — OXYCODONE-ACETAMINOPHEN 5-325 MG PO TABS: 1.0000 | ORAL_TABLET | ORAL | Status: AC | PRN

## 2011-06-18 MED ORDER — HEPARIN SOD (PORK) LOCK FLUSH 100 UNIT/ML IV SOLN
250.0000 [IU] | INTRAVENOUS | Status: AC | PRN
  Administered 2011-06-18: 250 [IU]

## 2011-06-18 MED ORDER — DEXTROSE 5 % IV SOLN: 1.0000 g | INTRAVENOUS | Status: DC

## 2011-06-18 MED ORDER — FUROSEMIDE 80 MG PO TABS: 80.0000 mg | ORAL_TABLET | Freq: Two times a day (BID) | ORAL | Status: DC

## 2011-06-18 MED ORDER — VANCOMYCIN HCL 1000 MG IV SOLR: 1750.0000 mg | Freq: Two times a day (BID) | INTRAVENOUS | Status: DC

## 2011-06-18 NOTE — Progress Notes (Signed)
CBG:63 at 0400  Treatment: 15 GM carbohydrate snack ( Orange Juice, Peanut butter with graham crackers)  Symptoms Pt states he was not feeling right and thought his blood sugar was low.  Follow-up CBG: Time:0425 CBG Result:80  Possible Reasons for Event: Medication regimen: 110 units of NPH at bedtime, same dose he was taking prior to a stated 100 pound weigh tloss over the last year. States he is going to his diabetes doctor in 4 weeks and will speak with him about the issue.  Comments/MD notified:    O'BRIEN, Verania Salberg P

## 2011-06-18 NOTE — Progress Notes (Signed)
Physical Therapy Treatment Patient Details Name: Timothy Trujillo MRN: [id number] DOB: 07-18-1950 Today's Date: 06/18/2011  PT Assessment/Plan  PT - Assessment/Plan Comments on Treatment Session: Noted for dc home today; lengthy discussion with pt re: managing at home and importance of keeping weight off of RLE; Worth considering HHOT follow-up as well PT Plan: Discharge plan remains appropriate PT Frequency: Min 6X/week Recommendations for Other Services: OT consult Follow Up Recommendations: Home health PT;Supervision - Intermittent (also worth getting HHOT) Equipment Recommended: None recommended by PT PT Goals  Acute Rehab PT Goals Time For Goal Achievement: 7 days Pt will go Supine/Side to Sit: Independently PT Goal: Supine/Side to Sit - Progress: Progressing toward goal Pt will go Sit to Supine/Side: Independently Pt will Transfer Bed to Chair/Chair to Bed: with modified independence PT Transfer Goal: Bed to Chair/Chair to Bed - Progress: Progressing toward goal  PT Treatment Precautions/Restrictions  Restrictions Weight Bearing Restrictions: Yes RLE Weight Bearing: Partial weight bearing RLE Partial Weight Bearing Percentage or Pounds: 25-50 Other Position/Activity Restrictions: limit activity to primarilty transfers per PA/MD orders Mobility (including Balance) Bed Mobility Supine to Sit: 6: Modified independent (Device/Increase time) Supine to Sit Details (indicate cue type and reason): smooth transition, able to manage bedclothes as well Transfers Transfers: Yes Lateral/Scoot Transfers: 4: Min assist (minguard assist without physical contact) Lateral/Scoot Transfer Details (indicate cue type and reason): Cues for technique; discussed using lateral scoot transfer for OOB to wheelchair to keep better PWBing/limit WBing RLE; pt verbalized understanding, though he also stated he is more likley going to stand pivot at home, especially in the bathroom (therefore wil not push for  drop-arm 3in1)       End of Session PT - End of Session Activity Tolerance: Patient tolerated treatment well Patient left: in chair;with call bell in reach General Behavior During Session: [facility] for tasks performed Cognition: [facility] for tasks performed  Van Clines [facility] [place], [place] [phone]  06/18/2011, 11:24 AM

## 2011-06-18 NOTE — Discharge Instructions (Signed)
Keep incision clean and dry.  Change dressing daily.  Please notify if any expanding redness develops or increase pain. Ok for partial weight bearing to right leg 50%Bone and Joint Infections Joint infections are called septic or infectious arthritis. An infected joint may damage cartilage and tissue very quickly. This may destroy the joint. Bone infections (osteomyelitis) may last for years. Joints may become stiff if left untreated. Bacteria are the most common cause. Other causes include viruses and fungi, but these are more rare. Bone and joint infections usually come from injury or infection elsewhere in your body; the germs are carried to your bones or joints through the bloodstream.  CAUSES   Blood-carried germs from an infection elsewhere in your body can eventually spread to a bone or joint. The germ staphylococcus is the most common cause of both osteomyelitis and septic arthritis.   An injury can introduce germs into your bones or joints.  SYMPTOMS   Weight loss.   Tiredness.   Chills and fever.   Bone or joint pain at rest and with activity.   Tenderness when touching the area or bending the joint.   Refusal to bear weight on a leg or inability to use an arm due to pain.   Decreased range of motion in a joint.   Skin redness, warmth, and tenderness.   Open skin sores and drainage.  RISK FACTORS Children, the elderly, and those with weak immune systems are at increased risk of bone and joint infections. It is more common in people with HIV infections and with people on chemotherapy. People are also at increased risk if they have surgery where metal implants are used to stabilize the bone. Plates, screws, or artificial joints provide a surface that bacteria can stick on. Such a growth of bacteria is called biofilm. The biofilm protects bacteria from antibiotics and bodily defenses. This allows germs to multiply. Other reasons for increased risks include:   Having previous  surgery or injury of a bone or joint.   Being on high-dose corticosteroids and immunosuppressive medications that weaken your body's resistance to germs.   Diabetes and long-standing diseases.   Use of intravenous street drugs.   Being on hemodialysis.   Having a history of urinary tract infections.   Removal of your spleen (splenectomy). This weakens your immunity.   Chronic viral infections such as HIV or AIDS.   Lack of sensation such as paraplegia, quadriplegia, or spina bifida.  DIAGNOSIS   Increased numbers of white blood cells in your blood may indicate infection. Some times your caregivers are able to identify the infecting germs by testing your blood. Inflammatory markers present in your bloodstream such as an erythrocyte sedimentation rate (ESR or sed rate) or c-reactive protein (CRP) can be indicators of deep infection.   Bone scans and X-ray exams are necessary for diagnosing osteomyelitis. They may help your caregiver find the infected areas. Other studies may give more detailed information. They may help detect fluid collections around a joint, abnormal bone surfaces, or be useful in diagnosing septic arthritis. They can find soft tissue swelling and find excess fluid in an infected joint or the adjacent bone. These tests include:   Ultrasound.   CT (computerized tomography).   MRI (magnetic resonance imaging).   The best test for diagnosing a bone or joint infection is an aspiration or biopsy. Your caregiver will usually use a local anesthetic. He or she can then remove tissue from a bone injury or use a needle to  take fluids from an infected joint. A local anesthetic medication numbs the area to be biopsied. Often biopsies are done in the operating room under general anesthesia. This means you will be asleep during the procedure. Tests performed on these samples can identify an infection.  TREATMENT   Treatment can help control long-standing infections, but infections  may come back.   Infections can infect any bone or joint at any age.   Bone and joint infections are rarely fatal.   Bone infection left untreated can become a never-ending infection. It can spread to other areas of your body. It may eventually cause bone death. Reduced limb or joint function can result. In severe cases, this may require removal of a limb. Spinal osteomyelitis is very dangerous. Untreated, it may damage spinal nerves and cause death.   The most common complication of septic arthritis is osteoarthritis with pain and decreased range of motion of the joint.  Some forms of treatment may include:  If the infection is caused by bacteria, it is generally treated with antibiotics. You will likely receive the drugs through a vein (intravenously) for anywhere from 2 to 6 weeks. In some cases, especially with children, oral antibiotics following an initial intravenous dose may be effective. The treatment you receive depends on the:   Type of bacteria.   Location of the infection.   Type of surgery that might be done.   Other health conditions or issues you might have.   Your caregiver may drain soft tissue abscesses or pockets of fluid around infected bones or joints. If you have septic arthritis, your caregiver may use a needle to drain pus from the joint on a daily basis. He or she may use an arthroscope to clean the joint or may need to open the joint surgically to remove damaged tissue and infection. An arthroscope is an instrument like a thin lighted telescope. It can be used to look inside the joint.   Surgery is usually needed if the infection has become long-standing. It may also be needed if there is hardware (such as metal plates, screws, or artificial joints) inside the patient. Sometimes a bone or muscle graft is needed to fill in the open space. This promotes growth of new tissues and better blood flow to the area.  PREVENTION   Clean and disinfect wounds quickly to help  prevent the start of a bone or joint infection. Get treatment for any infections to prevent spread to a bone or joint.   Do not smoke. Smoking decreases healing rates of bone and predisposes to infection.   When given medications that suppress your immune system, use them according to your caregiver's instructions. Do not take more than prescribed for your condition.   Take good care of your feet and skin, especially if you have diabetes, decreased sensation or circulation problems.  SEEK IMMEDIATE MEDICAL CARE IF:   You cannot bear weight on a leg or use an arm, especially following a minor injury. This can be a sign of bone or joint infection.   You think you may have signs or symptoms of a bone or joint infection. Your chance of getting rid of an infection is better if treated early.  Document Released: 03/25/2005 Document Revised: 03/14/2011 Document Reviewed: 02/22/2009 [facility] Patient Information 2012 [place], [place].

## 2011-06-18 NOTE — Evaluation (Signed)
Occupational Therapy Evaluation Patient Details Name: Timothy Trujillo MRN: [id number] DOB: [birth year] Today's Date: 06/18/2011  Problem List:  Patient Active Problem List  Diagnoses  . Cerebral artery occlusion  . Heart disease  . Obesity  . Peripheral vascular disease  . Diabetes mellitus  . Hypertension  . Hyperlipidemia  . Silicosis  . Osteomyelitis of knee region    Past Medical History:  Past Medical History  Diagnosis Date  . Diabetes mellitus   . Cerebral artery occlusion   . Heart disease   . Obesity   . Hyperlipidemia   . Peripheral vascular disease   . Hypertension   . Shortness of breath   . Angina   . Asthma   . Myocardial infarction   . Coronary artery disease   . Stroke   . Headache   . Arthritis   . Anxiety   . COPD (chronic obstructive pulmonary disease)   . Pneumonia   . Depression    Past Surgical History:  Past Surgical History  Procedure Date  . Coronary artery bypass graft   . Coronary stent placement 2008    CABG grafts closed  . Joint replacement   . Cholecystectomy   . Eye surgery   . I&d extremity 06/12/2011    Procedure: IRRIGATION AND DEBRIDEMENT EXTREMITY;  Surgeon: Javier Docker, MD;  Location: [facility] OR;  Service: Orthopedics;  Laterality: Right;  I&D Right [place]    OT Assessment/Plan/Recommendation OT Assessment Clinical Impression Statement: Pt. presents s/p I and D of Rt. LE and with increased pain and restricted weightbearing status. Pt. is D/C'ing home shortly and all education completed and will sign off acutely and defer further OT needs to [facility]. OT Recommendation/Assessment: All further OT needs can be met in the next venue of care OT Problem List: Decreased strength;Decreased activity tolerance;Decreased knowledge of use of DME or AE;Decreased knowledge of precautions OT Therapy Diagnosis : Acute pain OT Recommendation Follow Up Recommendations: Home health OT;Supervision - Intermittent Equipment Recommended: None  recommended by OT; pt. Has all needed DME     OT Evaluation Precautions/Restrictions  Restrictions Weight Bearing Restrictions: Yes RLE Weight Bearing: Partial weight bearing RLE Partial Weight Bearing Percentage or Pounds: 25-50 Other Position/Activity Restrictions: limit activity to primarilty transfers per PA/MD orders Prior Functioning Home Living Lives With: Alone Receives Help From: Other (Comment) Type of Home: House Home Layout: One level Home Access: Ramped entrance Bathroom Shower/Tub: Tub/shower unit;Curtain Bathroom Toilet: Handicapped height Bathroom Accessibility: Yes How Accessible: Accessible via wheelchair Home Adaptive Equipment: Bedside commode/3-in-1;Wheelchair - powered;Wheelchair - manual;Shower chair without back;Walker - rolling;Hand-held shower hose Prior Function Level of Independence: Requires assistive device for independence;Independent with basic ADLs;Independent with homemaking with wheelchair;Independent with transfers;Other (comment) Able to Take Stairs?: No Driving: Yes Vocation: On disability ADL ADL ADL Comments: Pt. educated and provided with AE to complete LB ADLs and pt. demonstrated understanding of technique. Pt. educated on use of RW for standing to use commode so pt. would have UE support if needed to assist with maintaining PWB precautions.     Extremity Assessment RUE Assessment RUE Assessment: Within Functional Limits LUE Assessment LUE Assessment: Within Functional Limits Mobility  Bed Mobility Bed Mobility: Yes Supine to Sit: 6: Modified independent (Device/Increase time) Transfers Transfers: No    End of Session General Behavior During Session: [facility] for tasks performed Cognition: [facility] for tasks performed   Johntavious Francom, OTR/L Pager [phone] 06/18/2011, 3:10 PM

## 2011-06-18 NOTE — Progress Notes (Signed)
06/18/2011 [facility], Bosie Clos SPARKS Case Management Note [phone]    CARE MANAGEMENT NOTE 06/18/2011  Patient:  ZACHARIA, SOWLES   Account Number:  [id number]  Date Initiated:  06/17/2011  Documentation initiated by:  Fransico Michael  Subjective/Objective Assessment:   admitted on 06/12/11 with swelling of the right knee.     Action/Plan:   prior to admission, patient lived at home and was independent with ADLs   Anticipated DC Date:  06/18/2011   Anticipated DC Plan:  HOME W HOME HEALTH SERVICES      DC Planning Services  CM consult      [facility] Choice  HOME HEALTH   Choice offered to / List presented to:  C-1 Patient        HH arranged  HH-1 RN      [facility] agency  [facility]   Status of service:  Completed, signed off Medicare Important Message given?   (If response is "NO", the following Medicare IM given date fields will be blank) Date Medicare IM given:   Date Additional Medicare IM given:    Discharge Disposition:  HOME W HOME HEALTH SERVICES  Per UR Regulation:  Reviewed for med. necessity/level of care/duration of stay  If discussed at Long Length of Stay Meetings, dates discussed:    Comments:  06/17/11-1130-J.Acelin Ferdig,RN,BSN      61yo male patient admitted with swelling to right knee (osteomyelitis). Prior to admission, patient lived at home and was independent with ADLs. Noted need for home health services for RN for IV antibiotic treatment. In to speak with patient to offer choice of agencies. Patient chose [facility]. Mary, RN with [facility] notified of new referral. No other discharge needs identified.

## 2011-06-18 NOTE — Discharge Summary (Signed)
Patient ID: Timothy Trujillo MRN: [id number] DOB/AGE: 1950/07/05 61 y.o.  Admit date: 06/12/2011 Discharge date: 06/18/2011  Primary Care Physician:  Fredirick Maudlin, MD, MD  Discharge Diagnoses:    Present on Admission:  .Osteomyelitis of knee region .Obesity .Hypertension .Peripheral vascular disease .Heart disease .Diabetes mellitus .Hyperlipidemia  Principal Problem:  *Osteomyelitis of knee region Active Problems:  Peripheral vascular disease  Heart disease  Obesity  Diabetes mellitus  Hypertension  Hyperlipidemia   Medication List  As of 06/18/2011  7:09 AM   ASK your doctor about these medications         aspirin 325 MG tablet   Take 325 mg by mouth daily.      ezetimibe 10 MG tablet   Commonly known as: ZETIA   Take 10 mg by mouth daily.      furosemide 80 MG tablet   Commonly known as: LASIX   Take 80 mg by mouth 3 (three) times daily.      insulin NPH 100 UNIT/ML injection   Commonly known as: HUMULIN N,NOVOLIN N   Inject 110 Units into the skin 2 (two) times daily.      isosorbide mononitrate 30 MG 24 hr tablet   Commonly known as: IMDUR   Take 30 mg by mouth daily.      metoprolol 200 MG 24 hr tablet   Commonly known as: TOPROL-XL   Take 200 mg by mouth 2 (two) times daily.      niacin 1000 MG CR tablet   Commonly known as: NIASPAN   Take 1,000 mg by mouth at bedtime.      nitroGLYCERIN 0.4 MG SL tablet   Commonly known as: NITROSTAT   Place 0.4 mg under the tongue every 5 (five) minutes as needed.      oxyCODONE-acetaminophen 5-325 MG per tablet   Commonly known as: PERCOCET   Take 1 tablet by mouth 4 (four) times daily.      simvastatin 80 MG tablet   Commonly known as: ZOCOR   Take 80 mg by mouth daily.      VITAMIN B 12 PO   Take 1 tablet by mouth daily.      VITAMIN C PO   Take 1 tablet by mouth daily.      VITAMIN E PO   Take 1 tablet by mouth daily.            Disposition and Follow-up: Patient will need to followup  with primary care provider in 4 weeks. Please obtain electrolyte panel to insure that creatinine is within normal limits and at patient's baseline. Please also ensure that her potassium level is within normal limits. Patient may need to have potassium supplemented given rather high dose of Lasix that he is taking at home. In addition patient will followup with surgery to have his stitches removed and the phone number contact information was provided. Patient will also followup with Dr. Ninetta Lights at the infectious disease clinic. Please note that patient was discharged on ceftriaxone IV and vancomycin IV. On the day of discharge 06/18/2011 patient has completed day 7 of antibiotics and will need to continue for additional 5 weeks. Expected end date for antibiotics is 07/23/2011.  Consults: ID for antibiotics management (Dr. Ninetta Lights), Surgery for bone biospy  Significant Diagnostic Studies:   02/27//20 MRI of the right leg with and without contrast: 1. Proximal right tibial bone infarct with superimposed osteomyelitis. Sequestrum present with ant medial tibial sinus tract, small medial tibial subperiosteal and soft  tissue abscess. ? presumed preexisting bone infarction.  2. Typical appearing bone infarcts in the distal femur bilaterally and left tibial diaphysis.  3. Findings were discussed with Dr. Juanetta Gosling at 1310 hours on the day of examination.  06/13/2011 Renal US: 1. Negative for acute findings, no hydronephrosis  Brief H and P: Pt is 60 yo male with chronic lung disease, oxygen dependent who was directly admitted after being seen by Dr. Ninetta Lights ID specialist for further evaluation of the right knee swelling that has been getting progressively worse over the past 3 weeks. 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 Pt was told he will need bone biopsy. Pt reports subjective fevers, chills, fatigue and poor oral intake. 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 He denies headaches, visual changes, no abdominal or urinary concerns.   Physical Exam on Discharge:  Filed Vitals:   06/17/11 1117 06/17/11 1345 06/17/11 2122 06/18/11 0520  BP: 104/71 125/77 137/71 127/60  Pulse: 99 101 99 99  Temp:  98.4 F (36.9 C) 97.4 F (36.3 C) 97.9 F (36.6 C)  TempSrc:  Oral Oral   Resp:  18 20 20   Height:      Weight:      SpO2:  97% 98% 99%     Intake/Output Summary (Last 24 hours) at 06/18/11 0709 Last data filed at 06/18/11 0500  Gross per 24 hour  Intake    960 ml  Output   3850 ml  Net  -2890 ml    General: Alert, awake, oriented x3, in no acute distress. HEENT: No bruits, no goiter. Heart: Regular rate and rhythm, without murmurs, rubs, gallops. Lungs: Clear to auscultation bilaterally. Abdomen: Soft, nontender, nondistended, positive bowel sounds. Extremities: No clubbing cyanosis or edema with positive pedal pulses. Neuro: Grossly intact, nonfocal.  CBC:    Component Value Date/Time   WBC 8.6 06/18/2011 0511   HGB 9.8* 06/18/2011 0511   HCT 30.5* 06/18/2011 0511   PLT 339 06/18/2011 0511   MCV 82.9 06/18/2011 0511   NEUTROABS 8.9* 08/25/2010 0528   LYMPHSABS 0.3* 08/25/2010 0528   MONOABS 0.4 08/25/2010 0528   EOSABS 0.1 08/25/2010 0528   BASOSABS 0.0 08/25/2010 0528    Basic Metabolic Panel:    Component Value Date/Time   NA 140 06/18/2011 0511   K 3.3* 06/18/2011 0511   CL 101 06/18/2011 0511   CO2 29 06/18/2011 0511   BUN 21 06/18/2011 0511   CREATININE 1.12 06/18/2011 0511   GLUCOSE 150* 06/18/2011 0511   CALCIUM 9.5 06/18/2011 0511    Hospital Course:  Principal Problem:  *Osteomyelitis of knee region  - please note the above finding on MRI  - Cultures to date are negative - Today 06/18/2011 patient has completed 7 days of antibiotics and has tolerated treatment well with no evident  complications - Patient has remained afebrile and white blood cell count has remained stable and within normal limits - Please note the patient will continue taking antibiotics for additional 5 weeks and anticipated stop date is 07/23/2011  Active Problems:  ARF  - unclear etiology but possibly related to Lasix  - dose of Lasix was decreased for 2 days - Patient has responded well to medication and dose adjustment - Renal ultrasound was negative for hydronephrosis or other acute findings - Creatinine on discharge is within normal  limits which is patient's baseline  Peripheral vascular disease  - chronic   Heart disease  - no recent 2 D ECHO on file  - will continue Aspirin   Obesity  - BMI > 40  - Nutrition consult provided by me  Diabetes mellitus  - uncontrolled  - will continue home medication regimen   Hypertension  - Remained stable during the stay  Hyperlipidemia  - stable   EDUCATION  - test results and diagnostic studies were discussed with patient  - patient erbalized the understanding  - questions were answered at the bedside and contact information was provided for additional questions or concerns   Time spent on Discharge: Over 30 minutes  Signed: Debbora Presto 06/18/2011, 7:09 AM  [facility]  [phone]

## 2011-06-24 ENCOUNTER — Telehealth: Payer: Self-pay | Admitting: *Deleted

## 2011-06-24 NOTE — Telephone Encounter (Signed)
Called [facility] per Dr. Ninetta Lights to make sure Vancomycin dose had been adjusted, it was high at 24.4. Dose was held on 06/21/11 and adjustment made. Wendall Mola CMA

## 2011-07-02 ENCOUNTER — Telehealth: Payer: Self-pay | Admitting: *Deleted

## 2011-07-02 NOTE — Telephone Encounter (Signed)
Burning with urination and rash around testicles.  Pt believes related to IV antibiotic.  Text page to Dr. Ninetta Lights for advice.  Dr. Ninetta Lights returned call.  Dr. Ninetta Lights recommended that pt go to his PCP for this evaluation, [facility] does not have any appts for the remainder of this week.  Phone call to pt to share this advice.  Pt agreed to call his PCP to evaluate this problem.  Pt transferred to [facility] to schedule [facility] appt.

## 2011-07-12 ENCOUNTER — Inpatient Hospital Stay: Payer: Medicare PPO | Admitting: Infectious Diseases

## 2011-07-15 ENCOUNTER — Encounter: Payer: Self-pay | Admitting: Infectious Diseases

## 2011-07-16 ENCOUNTER — Telehealth: Payer: Self-pay | Admitting: *Deleted

## 2011-07-16 NOTE — Telephone Encounter (Signed)
Lyn, a pharmacist from [facility] called asking for the end date of his meds. I told him I do not see that in the notes. I will send a message to the md & call him

## 2011-07-19 ENCOUNTER — Ambulatory Visit (INDEPENDENT_AMBULATORY_CARE_PROVIDER_SITE_OTHER): Payer: Medicare PPO | Admitting: Infectious Diseases

## 2011-07-19 ENCOUNTER — Encounter: Payer: Self-pay | Admitting: Infectious Diseases

## 2011-07-19 VITALS — BP 146/85 | HR 118 | Temp 97.6°F | Ht 75.0 in | Wt 327.0 lb

## 2011-07-19 DIAGNOSIS — M869 Osteomyelitis, unspecified: Secondary | ICD-10-CM

## 2011-07-19 LAB — COMPLETE METABOLIC PANEL WITH GFR
AST: 15 U/L (ref 0–37)
BUN: 21 mg/dL (ref 6–23)
CO2: 20 mEq/L (ref 19–32)
Calcium: 9.8 mg/dL (ref 8.4–10.5)
Chloride: 100 mEq/L (ref 96–112)
Creat: 1.29 mg/dL (ref 0.50–1.35)
GFR, Est African American: 68 mL/min
GFR, Est Non African American: 59 mL/min — ABNORMAL LOW

## 2011-07-19 LAB — CBC
HCT: 39.3 % (ref 39.0–52.0)
MCV: 85.8 fL (ref 78.0–100.0)
RDW: 17.6 % — ABNORMAL HIGH (ref 11.5–15.5)
WBC: 8.9 10*3/uL (ref 4.0–10.5)

## 2011-07-19 MED ORDER — DOXYCYCLINE HYCLATE 100 MG PO TABS: 100.0000 mg | ORAL_TABLET | Freq: Two times a day (BID) | ORAL | Status: DC

## 2011-07-19 NOTE — Progress Notes (Signed)
  Subjective:    Patient ID: Timothy Trujillo, male    DOB: 1951/02/06, 61 y.o.   MRN: [id number]  HPI 61 yo M with hx of DM2, CAD, and HTN. Since February 2012 has had pain in his R knee. He states he was told that he had arthritis. Had u/s last month that was negative for DVT. Pain increased to the point he was unable to stand. Was seen in ED and had plain films and was told he had "dead bone". He f/u with his PCP and then had MRI done on June 05, 2011 showed a proximal right tibial bone infarct superimposed osteomyelitis. Sequestrum is present with anterior medial tibial sinus tract and small medial tibial subperiosteal and soft tissue abscess. Difficult to evaluate in the setting of presumed pre-existing bone infarction.  He was seen in ID 3-6 and admitted to the hospital. He was eval by ortho, taken to OR and found to have: 1. Osteomyelitis of the right tibia. 2. Abscess of the right proximal tibia.3. Septic tenosynovitis of the pes anserinus tendons sheath.  His routine cultures were (-), AFB is NGTD.  He was d/c home on vanco and ceftriaxone. His course was also complicated by worsened renal function in the hospital.  Pain in knee is resolved now. States after d/c he "mashed a cup full of infection out of it... It gagged me".  Has had no problems with anbx, had genital "yeast" infxn. Got 10 days of pills, cream and powder.  No problems with pic line. No erythema or d/c.    Review of Systems  Constitutional: Negative for fever, chills and appetite change.  Gastrointestinal: Negative for diarrhea and constipation.  Genitourinary: Negative for dysuria.  Skin: Negative for rash.       Objective:   Physical Exam  Constitutional: He appears well-developed and well-nourished.  HENT:  Mouth/Throat: No oropharyngeal exudate.  Eyes: EOM are normal.  Cardiovascular: Normal rate, regular rhythm and normal heart sounds.   Pulmonary/Chest: No respiratory distress. He has no wheezes. He has no  rales.  Abdominal: Soft. Bowel sounds are normal. There is no tenderness.  Musculoskeletal: He exhibits edema.       Legs: Lymphadenopathy:    He has no cervical adenopathy.          Assessment & Plan:

## 2011-07-19 NOTE — Assessment & Plan Note (Signed)
Will pull his PIC line today as he wants to stop. He is at 5.5 weeks of rx. Will start him on po rx and repeat his MRI to assess his ? Bony defect. Not clear if this is continued infection, bony infarct or just surgical remnant. Recheck his labs today (ESR, CRP, CMP, CBC) Will see him back in 2-3 weeks.

## 2011-07-19 NOTE — Progress Notes (Signed)
Addended by: Mariea Clonts D on: 07/19/2011 11:48 AM   Modules accepted: Orders

## 2011-07-19 NOTE — Progress Notes (Signed)
Addended by: Briane Birden C on: 07/19/2011 11:15 AM   Modules accepted: Orders

## 2011-07-19 NOTE — Progress Notes (Signed)
Per Dr Ninetta Lights, PICC removed from patient's right arm. PICC site unremarkable.PICC removed using sterile procedure.  48 cm PICC removed without problems.  Petroleum gauze bandage applied to area.  Pt advised not to remove bandage for 24 and avoid heavy lifting within this time period.  Pt tolerated procedure well.   Advised to call our office if bandage becomes soaked  with blood.  Laurell Josephs, RN IV

## 2011-07-19 NOTE — Progress Notes (Signed)
Addended by: Mariea Clonts D on: 07/19/2011 12:13 PM   Modules accepted: Orders

## 2011-07-22 ENCOUNTER — Other Ambulatory Visit: Payer: Self-pay | Admitting: Licensed Clinical Social Worker

## 2011-07-22 DIAGNOSIS — M869 Osteomyelitis, unspecified: Secondary | ICD-10-CM

## 2011-07-22 MED ORDER — DOXYCYCLINE HYCLATE 100 MG PO TABS: 100.0000 mg | ORAL_TABLET | Freq: Two times a day (BID) | ORAL | Status: AC

## 2011-07-24 ENCOUNTER — Encounter: Payer: Self-pay | Admitting: Infectious Diseases

## 2011-07-25 ENCOUNTER — Telehealth: Payer: Self-pay | Admitting: *Deleted

## 2011-07-25 NOTE — Telephone Encounter (Signed)
Called patient and notified of MRI appt at [facility] for 08/05/11 at 9:45 AM. Wendall Mola CMA

## 2011-07-25 NOTE — Telephone Encounter (Signed)
The meds have ended as of last Friday per md notes. I called [facility] pharmacy & spoke with Okey Regal. I told her the PICC was pulled last Friday the 12th. So they can d/c all iv meds

## 2011-07-28 LAB — AFB CULTURE WITH SMEAR (NOT AT ARMC)

## 2011-08-05 ENCOUNTER — Ambulatory Visit (HOSPITAL_COMMUNITY)
Admission: RE | Admit: 2011-08-05 | Discharge: 2011-08-05 | Disposition: A | Discharge status: Home / Self Care | Payer: Medicare PPO | Source: Ambulatory Visit | Attending: Infectious Diseases | Admitting: Infectious Diseases

## 2011-08-05 DIAGNOSIS — M869 Osteomyelitis, unspecified: Secondary | ICD-10-CM

## 2011-08-05 DIAGNOSIS — M86669 Other chronic osteomyelitis, unspecified tibia and fibula: Secondary | ICD-10-CM | POA: Insufficient documentation

## 2011-08-05 MED ORDER — GADOBENATE DIMEGLUMINE 529 MG/ML IV SOLN
20.0000 mL | Freq: Once | INTRAVENOUS | Status: AC | PRN
  Administered 2011-08-05: 20 mL via INTRAVENOUS

## 2011-08-06 ENCOUNTER — Ambulatory Visit (INDEPENDENT_AMBULATORY_CARE_PROVIDER_SITE_OTHER): Payer: Medicare PPO | Admitting: Infectious Diseases

## 2011-08-06 ENCOUNTER — Encounter: Payer: Self-pay | Admitting: Infectious Diseases

## 2011-08-06 ENCOUNTER — Telehealth: Payer: Self-pay | Admitting: *Deleted

## 2011-08-06 VITALS — BP 133/84 | HR 114 | Temp 97.4°F | Ht 75.0 in | Wt 315.0 lb

## 2011-08-06 DIAGNOSIS — M869 Osteomyelitis, unspecified: Secondary | ICD-10-CM

## 2011-08-06 MED ORDER — SODIUM CHLORIDE 0.9 % IV SOLN: 1.0000 g | INTRAVENOUS | Status: DC

## 2011-08-06 NOTE — Progress Notes (Signed)
  Subjective:    Patient ID: Timothy Trujillo, male    DOB: 1950/07/24, 61 y.o.   MRN: [id number]  HPI 61 yo M with hx of DM2, CAD, and HTN. Since February 2012 has had pain in his R knee. He states he was told that he had arthritis. Had u/s February 2013 that was negative for DVT. Pain increased to the point he was unable to stand. Was seen in ED and had plain films and was told he had "dead bone". He f/u with his PCP and then had MRI done on June 05, 2011 showed a proximal right tibial bone infarct superimposed osteomyelitis. Sequestrum is present with anterior medial tibial sinus tract and small medial tibial subperiosteal and soft tissue abscess. Difficult to evaluate in the setting of presumed pre-existing bone infarction.  He was seen in ID 06-12-11 and admitted to the hospital. He was eval by ortho, taken to OR and found to have: 1. Osteomyelitis of the right tibia. 2. Abscess of the right proximal tibia.3. Septic tenosynovitis of the pes anserinus tendons sheath.  His routine cultures were (-), AFB is NGTD.  He was d/c home on vanco and ceftriaxone. His course was also complicated by worsened renal function in the hospital. . States after d/c he "mashed a cup full of infection out of it... It gagged me".  He f/u in ID clinic 4-12 and had his PIC removed (after ~ 5 weeks IV anbx) and was changed to doxy. He had noticable abnormality of his knee and was sent for repeat MRI (08-05-11): 1. Worsening osteomyelitis with a large bone abscess and multiple daughter/micro abscesses. 2. Enlarging area of cortical breakthrough and progressive soft tissue involvement. States he has been doing well until last Friday. Thought he had an "ant crawling down his leg" but was infection pouring down his leg. No f/c. Still on doxy. No erythema in knee, continues to drain. No swelling in knee. No change in ability to bear weight ("in fact I can walk better").    Review of Systems  Constitutional: Negative for fever and  chills.  Gastrointestinal: Positive for constipation. Negative for diarrhea.  Genitourinary: Negative for dysuria.  Musculoskeletal: Negative for joint swelling.       Objective:   Physical Exam  Constitutional: He appears well-developed and well-nourished. No distress.       On O2  HENT:  Mouth/Throat: Oropharynx is clear and moist. No oropharyngeal exudate.  Eyes: EOM are normal. Pupils are equal, round, and reactive to light.  Neck: Neck supple.  Cardiovascular: Normal rate, regular rhythm and normal heart sounds.   Pulmonary/Chest: Effort normal and breath sounds normal.  Abdominal: Soft. Bowel sounds are normal. There is no tenderness.  Musculoskeletal: He exhibits edema.       There is a ~62mm Os at his R tibial plateau. There is fluid that drains from this with pressure. Occasionally this fluid is cloudy. This is non-tender. There is no increase in heat.  I did remove an ant from his lower leg (distal from his wound) during the exam.  Lymphadenopathy:    He has no cervical adenopathy.          Assessment & Plan:

## 2011-08-06 NOTE — Assessment & Plan Note (Addendum)
He does not appear to be improving. Will send a repeat cx (after swabbing his os with alcohol pad)- routine, afb, fungal. Although I did remove an ant from his leg, i doubt that this is related to his current wound. Discussed with Dr Shelle Iron regarding need for further surgical intervention. He will f/u in their office as well (today at 1:00). My great appreciation to Dr Shelle Iron for seeing him so promptly. Will re-insert PIC, restart vanco, change ceftriaxone to Invanz, stop doxy. See him back in 2-3 weeks.

## 2011-08-06 NOTE — Telephone Encounter (Signed)
Call to [facility] and order faxed for IV antibiotics, first dose to be administered in patient's home.  Patient having PICC line placed Thursday 08/08/11 at [facility] at 2:00 pm.  Could not arrange first dose at short stay because they close at 4:00 pm.  Patient aware of appt date and time. Wendall Mola CMA

## 2011-08-08 ENCOUNTER — Ambulatory Visit (HOSPITAL_COMMUNITY)
Admission: RE | Admit: 2011-08-08 | Discharge: 2011-08-08 | Disposition: A | Discharge status: Home / Self Care | Payer: Medicare PPO | Source: Ambulatory Visit | Attending: Infectious Diseases | Admitting: Infectious Diseases

## 2011-08-08 ENCOUNTER — Other Ambulatory Visit: Payer: Self-pay | Admitting: Infectious Diseases

## 2011-08-08 DIAGNOSIS — M869 Osteomyelitis, unspecified: Secondary | ICD-10-CM

## 2011-08-08 NOTE — Procedures (Signed)
Successful RUE SL POWER PICC Tip svc/ra Ready to use No comp Full report in pacs

## 2011-08-09 LAB — WOUND CULTURE: Gram Stain: NONE SEEN

## 2011-08-15 NOTE — Progress Notes (Signed)
F/U 09/04/11 @ 11:15 w/ Dr. Ninetta Lights

## 2011-08-28 ENCOUNTER — Ambulatory Visit: Payer: Medicare PPO | Admitting: Infectious Diseases

## 2011-09-04 ENCOUNTER — Encounter: Payer: Self-pay | Admitting: Infectious Diseases

## 2011-09-04 ENCOUNTER — Ambulatory Visit (INDEPENDENT_AMBULATORY_CARE_PROVIDER_SITE_OTHER): Payer: Medicare PPO | Admitting: Infectious Diseases

## 2011-09-04 VITALS — BP 123/78 | HR 120 | Temp 97.8°F | Ht 75.0 in | Wt 318.0 lb

## 2011-09-04 DIAGNOSIS — M869 Osteomyelitis, unspecified: Secondary | ICD-10-CM

## 2011-09-04 MED ORDER — SULFAMETHOXAZOLE-TMP DS 800-160 MG PO TABS: 1.0000 | ORAL_TABLET | Freq: Two times a day (BID) | ORAL | Status: AC

## 2011-09-04 NOTE — Progress Notes (Signed)
  Subjective:    Patient ID: Timothy Trujillo, male    DOB: 02/14/1951, 61 y.o.   MRN: [id number]  HPI 61 yo M with hx of DM2, CAD, and HTN. Since February 2012 has had pain in his R knee. He states he was told that he had arthritis. Had u/s February 2013 that was negative for DVT. Pain increased to the point he was unable to stand. Was seen in ED and had plain films and was told he had "dead bone". He f/u with his PCP and then had MRI done on June 05, 2011 showed a proximal right tibial bone infarct superimposed osteomyelitis. Sequestrum is present with anterior medial tibial sinus tract and small medial tibial subperiosteal and soft tissue abscess. Difficult to evaluate in the setting of presumed pre-existing bone infarction.  He was seen in ID 06-12-11 and admitted to the hospital. He was eval by ortho, taken to OR and found to have: 1. Osteomyelitis of the right tibia. 2. Abscess of the right proximal tibia.3. Septic tenosynovitis of the pes anserinus tendons sheath.  His routine cultures were (-), AFB is NGTD.  He was d/c home on vanco and ceftriaxone. His course was also complicated by worsened renal function in the hospital. . States after d/c he "mashed a cup full of infection out of it... It gagged me".  He f/u in ID clinic 4-12 and had his PIC removed (after ~ 5 weeks IV anbx) and was changed to doxy. He had noticable abnormality of his knee and was sent for repeat MRI (08-05-11): 1. Worsening osteomyelitis with a large bone abscess and multiple daughter/micro abscesses. 2. Enlarging area of cortical breakthrough and progressive soft tissue involvement.  He was seen in ID clinic on April 30th, restarted on his IV anbx (vanco/invanz). Cx sent from his wound was routine (-), afb and fungus both NGTD.  Bearing wt well, doesn't even know that there is a wound there. Just needs to do loval wound care. has had no problems with [facility] with exception that it will not withdraw blood (does infuse easily).       Review of Systems His breathing is at baseline, mostly stays indoors. Would like to be outside more hay cutting season.     Objective:   Physical Exam  Constitutional: He appears well-developed and well-nourished. No distress.  Musculoskeletal:       Legs:         Assessment & Plan:

## 2011-09-04 NOTE — Assessment & Plan Note (Addendum)
Somewhat limited with Cx (-). WIll cont his IV therapy as previously written and then transition him to bactrim. He has seen Dr Shelle Iron who also referred him to [facility]. Will see him back in 6 weeks and consider MRI at that time. He will call if he has worsening in the intervening period.

## 2011-09-19 ENCOUNTER — Telehealth: Payer: Self-pay | Admitting: Licensed Clinical Social Worker

## 2011-09-19 NOTE — Telephone Encounter (Signed)
Timothy Bible called from [facility] wanting an order to pull picc, patient wants it out and is refusing to keep the picc line any longer after therapy ends. He finished Vanc today and will complete his Ancef tomorrow. I called Timothy Trujillo after reading Dr. Moshe Cipro last note and gave her a verbal to pull the picc and the patient will need to pick up bactrim from his pharmacy. It was called in at his last office visit.

## 2011-09-25 ENCOUNTER — Encounter: Payer: Self-pay | Admitting: Infectious Diseases

## 2011-09-30 ENCOUNTER — Encounter: Payer: Self-pay | Admitting: Infectious Diseases

## 2011-10-23 ENCOUNTER — Ambulatory Visit: Payer: Medicare PPO | Admitting: Infectious Diseases

## 2011-12-22 DIAGNOSIS — R079 Chest pain, unspecified: Secondary | ICD-10-CM

## 2011-12-23 DIAGNOSIS — R079 Chest pain, unspecified: Secondary | ICD-10-CM

## 2011-12-24 DIAGNOSIS — I214 Non-ST elevation (NSTEMI) myocardial infarction: Secondary | ICD-10-CM

## 2011-12-24 DIAGNOSIS — R079 Chest pain, unspecified: Secondary | ICD-10-CM

## 2011-12-25 DIAGNOSIS — I2510 Atherosclerotic heart disease of native coronary artery without angina pectoris: Secondary | ICD-10-CM

## 2011-12-26 DIAGNOSIS — I5021 Acute systolic (congestive) heart failure: Secondary | ICD-10-CM

## 2012-01-29 ENCOUNTER — Encounter: Payer: Medicare PPO | Admitting: Cardiology

## 2012-06-15 DIAGNOSIS — I509 Heart failure, unspecified: Secondary | ICD-10-CM

## 2012-06-15 DIAGNOSIS — I5023 Acute on chronic systolic (congestive) heart failure: Secondary | ICD-10-CM

## 2012-06-15 DIAGNOSIS — I2589 Other forms of chronic ischemic heart disease: Secondary | ICD-10-CM

## 2012-06-15 DIAGNOSIS — R079 Chest pain, unspecified: Secondary | ICD-10-CM

## 2012-06-16 DIAGNOSIS — I2589 Other forms of chronic ischemic heart disease: Secondary | ICD-10-CM

## 2012-06-16 DIAGNOSIS — I509 Heart failure, unspecified: Secondary | ICD-10-CM

## 2012-06-17 DIAGNOSIS — I2589 Other forms of chronic ischemic heart disease: Secondary | ICD-10-CM

## 2012-06-17 DIAGNOSIS — I509 Heart failure, unspecified: Secondary | ICD-10-CM

## 2012-06-19 DIAGNOSIS — I5023 Acute on chronic systolic (congestive) heart failure: Secondary | ICD-10-CM

## 2013-01-08 ENCOUNTER — Encounter (HOSPITAL_COMMUNITY): Payer: Self-pay | Admitting: Emergency Medicine

## 2013-01-08 ENCOUNTER — Emergency Department (HOSPITAL_COMMUNITY): Payer: Medicare Other

## 2013-01-08 ENCOUNTER — Inpatient Hospital Stay (HOSPITAL_COMMUNITY)
Admission: EM | Admit: 2013-01-08 | Discharge: 2013-01-18 | DRG: 286 | Disposition: A | Discharge status: Home Health | Payer: Medicare Other | Attending: Cardiovascular Disease | Admitting: Cardiovascular Disease

## 2013-01-08 DIAGNOSIS — F329 Major depressive disorder, single episode, unspecified: Secondary | ICD-10-CM | POA: Diagnosis present

## 2013-01-08 DIAGNOSIS — E119 Type 2 diabetes mellitus without complications: Secondary | ICD-10-CM

## 2013-01-08 DIAGNOSIS — I2510 Atherosclerotic heart disease of native coronary artery without angina pectoris: Secondary | ICD-10-CM

## 2013-01-08 DIAGNOSIS — I5023 Acute on chronic systolic (congestive) heart failure: Principal | ICD-10-CM | POA: Diagnosis present

## 2013-01-08 DIAGNOSIS — I498 Other specified cardiac arrhythmias: Secondary | ICD-10-CM | POA: Diagnosis present

## 2013-01-08 DIAGNOSIS — I509 Heart failure, unspecified: Secondary | ICD-10-CM

## 2013-01-08 DIAGNOSIS — J628 Pneumoconiosis due to other dust containing silica: Secondary | ICD-10-CM | POA: Diagnosis present

## 2013-01-08 DIAGNOSIS — I519 Heart disease, unspecified: Secondary | ICD-10-CM | POA: Diagnosis present

## 2013-01-08 DIAGNOSIS — I10 Essential (primary) hypertension: Secondary | ICD-10-CM

## 2013-01-08 DIAGNOSIS — IMO0001 Reserved for inherently not codable concepts without codable children: Secondary | ICD-10-CM | POA: Diagnosis present

## 2013-01-08 DIAGNOSIS — I679 Cerebrovascular disease, unspecified: Secondary | ICD-10-CM

## 2013-01-08 DIAGNOSIS — Z91199 Patient's noncompliance with other medical treatment and regimen due to unspecified reason: Secondary | ICD-10-CM

## 2013-01-08 DIAGNOSIS — Z9981 Dependence on supplemental oxygen: Secondary | ICD-10-CM

## 2013-01-08 DIAGNOSIS — Z79899 Other long term (current) drug therapy: Secondary | ICD-10-CM

## 2013-01-08 DIAGNOSIS — F3289 Other specified depressive episodes: Secondary | ICD-10-CM | POA: Diagnosis present

## 2013-01-08 DIAGNOSIS — Z87891 Personal history of nicotine dependence: Secondary | ICD-10-CM

## 2013-01-08 DIAGNOSIS — E785 Hyperlipidemia, unspecified: Secondary | ICD-10-CM

## 2013-01-08 DIAGNOSIS — N179 Acute kidney failure, unspecified: Secondary | ICD-10-CM

## 2013-01-08 DIAGNOSIS — E669 Obesity, unspecified: Secondary | ICD-10-CM

## 2013-01-08 DIAGNOSIS — T502X5A Adverse effect of carbonic-anhydrase inhibitors, benzothiadiazides and other diuretics, initial encounter: Secondary | ICD-10-CM | POA: Diagnosis not present

## 2013-01-08 DIAGNOSIS — I200 Unstable angina: Secondary | ICD-10-CM | POA: Diagnosis present

## 2013-01-08 DIAGNOSIS — Z7982 Long term (current) use of aspirin: Secondary | ICD-10-CM

## 2013-01-08 DIAGNOSIS — Z9861 Coronary angioplasty status: Secondary | ICD-10-CM

## 2013-01-08 DIAGNOSIS — J9620 Acute and chronic respiratory failure, unspecified whether with hypoxia or hypercapnia: Secondary | ICD-10-CM | POA: Diagnosis present

## 2013-01-08 DIAGNOSIS — I959 Hypotension, unspecified: Secondary | ICD-10-CM | POA: Diagnosis not present

## 2013-01-08 DIAGNOSIS — I739 Peripheral vascular disease, unspecified: Secondary | ICD-10-CM | POA: Diagnosis present

## 2013-01-08 DIAGNOSIS — I255 Ischemic cardiomyopathy: Secondary | ICD-10-CM

## 2013-01-08 DIAGNOSIS — Z8673 Personal history of transient ischemic attack (TIA), and cerebral infarction without residual deficits: Secondary | ICD-10-CM

## 2013-01-08 DIAGNOSIS — I2589 Other forms of chronic ischemic heart disease: Secondary | ICD-10-CM | POA: Diagnosis present

## 2013-01-08 DIAGNOSIS — R000 Tachycardia, unspecified: Secondary | ICD-10-CM

## 2013-01-08 DIAGNOSIS — J4489 Other specified chronic obstructive pulmonary disease: Secondary | ICD-10-CM | POA: Diagnosis present

## 2013-01-08 DIAGNOSIS — F411 Generalized anxiety disorder: Secondary | ICD-10-CM | POA: Diagnosis present

## 2013-01-08 DIAGNOSIS — Z9119 Patient's noncompliance with other medical treatment and regimen: Secondary | ICD-10-CM

## 2013-01-08 DIAGNOSIS — I25810 Atherosclerosis of coronary artery bypass graft(s) without angina pectoris: Secondary | ICD-10-CM | POA: Diagnosis present

## 2013-01-08 DIAGNOSIS — J449 Chronic obstructive pulmonary disease, unspecified: Secondary | ICD-10-CM

## 2013-01-08 DIAGNOSIS — R0603 Acute respiratory distress: Secondary | ICD-10-CM

## 2013-01-08 DIAGNOSIS — Z6834 Body mass index (BMI) 34.0-34.9, adult: Secondary | ICD-10-CM

## 2013-01-08 DIAGNOSIS — I252 Old myocardial infarction: Secondary | ICD-10-CM

## 2013-01-08 DIAGNOSIS — Z794 Long term (current) use of insulin: Secondary | ICD-10-CM

## 2013-01-08 HISTORY — DX: Chronic systolic (congestive) heart failure: I50.22

## 2013-01-08 HISTORY — DX: Chronic obstructive pulmonary disease, unspecified: J44.9

## 2013-01-08 HISTORY — DX: Ischemic cardiomyopathy: I25.5

## 2013-01-08 HISTORY — DX: Atherosclerotic heart disease of native coronary artery without angina pectoris: I25.10

## 2013-01-08 HISTORY — DX: Reserved for concepts with insufficient information to code with codable children: IMO0002

## 2013-01-08 HISTORY — DX: Chronic respiratory failure, unspecified whether with hypoxia or hypercapnia: J96.10

## 2013-01-08 HISTORY — DX: Type 2 diabetes mellitus with hyperglycemia: E11.65

## 2013-01-08 LAB — GLUCOSE, CAPILLARY: Glucose-Capillary: 259 mg/dL — ABNORMAL HIGH (ref 70–99)

## 2013-01-08 LAB — BASIC METABOLIC PANEL
Calcium: 10.2 mg/dL (ref 8.4–10.5)
GFR calc Af Amer: 77 mL/min — ABNORMAL LOW (ref >90)
GFR calc non Af Amer: 66 mL/min — ABNORMAL LOW (ref >90)
Glucose, Bld: 296 mg/dL — ABNORMAL HIGH (ref 70–99)
Potassium: 4.4 mEq/L (ref 3.5–5.1)
Sodium: 134 mEq/L — ABNORMAL LOW (ref 135–145)

## 2013-01-08 LAB — CBC WITH DIFFERENTIAL/PLATELET
Basophils Absolute: 0 10*3/uL (ref 0.0–0.1)
Eosinophils Absolute: 0.2 10*3/uL (ref 0.0–0.7)
Lymphs Abs: 0.9 10*3/uL (ref 0.7–4.0)
MCH: 30.4 pg (ref 26.0–34.0)
Neutrophils Relative %: 80 % — ABNORMAL HIGH (ref 43–77)
Platelets: 261 10*3/uL (ref 150–400)
RBC: 4.38 MIL/uL (ref 4.22–5.81)
RDW: 15.2 % (ref 11.5–15.5)
WBC: 8.1 10*3/uL (ref 4.0–10.5)

## 2013-01-08 LAB — D-DIMER, QUANTITATIVE: D-Dimer, Quant: 0.95 ug/mL-FEU — ABNORMAL HIGH (ref 0.00–0.48)

## 2013-01-08 LAB — TROPONIN I
Troponin I: <0.3 ng/mL (ref <0.30)
Troponin I: <0.3 ng/mL (ref <0.30)

## 2013-01-08 LAB — PRO B NATRIURETIC PEPTIDE: Pro B Natriuretic peptide (BNP): 1740 pg/mL — ABNORMAL HIGH (ref 0–125)

## 2013-01-08 LAB — MRSA PCR SCREENING: MRSA by PCR: NEGATIVE

## 2013-01-08 MED ORDER — INSULIN ASPART 100 UNIT/ML ~~LOC~~ SOLN
0.0000 [IU] | Freq: Three times a day (TID) | SUBCUTANEOUS | Status: DC
  Administered 2013-01-09 (×2): 3 [IU] via SUBCUTANEOUS
  Administered 2013-01-10: 4 [IU] via SUBCUTANEOUS
  Administered 2013-01-10: 7 [IU] via SUBCUTANEOUS
  Administered 2013-01-10: 4 [IU] via SUBCUTANEOUS
  Administered 2013-01-11: 7 [IU] via SUBCUTANEOUS
  Administered 2013-01-11: 4 [IU] via SUBCUTANEOUS
  Administered 2013-01-11: 11 [IU] via SUBCUTANEOUS
  Administered 2013-01-12: 4 [IU] via SUBCUTANEOUS
  Administered 2013-01-12 (×2): 11 [IU] via SUBCUTANEOUS
  Administered 2013-01-13 (×2): 7 [IU] via SUBCUTANEOUS
  Administered 2013-01-13 – 2013-01-14 (×2): 4 [IU] via SUBCUTANEOUS
  Administered 2013-01-14: 12:00:00 7 [IU] via SUBCUTANEOUS
  Administered 2013-01-15 (×3): 4 [IU] via SUBCUTANEOUS
  Administered 2013-01-16: 7 [IU] via SUBCUTANEOUS
  Administered 2013-01-16: 3 [IU] via SUBCUTANEOUS
  Administered 2013-01-16: 4 [IU] via SUBCUTANEOUS
  Administered 2013-01-17: 12:00:00 11 [IU] via SUBCUTANEOUS
  Administered 2013-01-17: 7 [IU] via SUBCUTANEOUS
  Administered 2013-01-17: 17:00:00 11 [IU] via SUBCUTANEOUS
  Administered 2013-01-18 (×2): 7 [IU] via SUBCUTANEOUS

## 2013-01-08 MED ORDER — INSULIN ASPART 100 UNIT/ML ~~LOC~~ SOLN
0.0000 [IU] | Freq: Every day | SUBCUTANEOUS | Status: DC
  Administered 2013-01-08: 3 [IU] via SUBCUTANEOUS
  Administered 2013-01-11 – 2013-01-13 (×2): 2 [IU] via SUBCUTANEOUS

## 2013-01-08 MED ORDER — FUROSEMIDE 10 MG/ML IJ SOLN
80.0000 mg | Freq: Once | INTRAMUSCULAR | Status: AC
  Administered 2013-01-08: 80 mg via INTRAVENOUS
  Filled 2013-01-08: qty 8

## 2013-01-08 MED ORDER — NITROGLYCERIN IN D5W 200-5 MCG/ML-% IV SOLN
5.0000 ug/min | Freq: Once | INTRAVENOUS | Status: AC
  Administered 2013-01-08: 5 ug/min via INTRAVENOUS
  Filled 2013-01-08: qty 1250

## 2013-01-08 MED ORDER — ONDANSETRON HCL 4 MG PO TABS: 4.0000 mg | ORAL_TABLET | Freq: Four times a day (QID) | ORAL | Status: DC | PRN

## 2013-01-08 MED ORDER — FUROSEMIDE 10 MG/ML IJ SOLN
80.0000 mg | Freq: Two times a day (BID) | INTRAMUSCULAR | Status: DC
  Administered 2013-01-08 – 2013-01-13 (×11): 80 mg via INTRAVENOUS
  Filled 2013-01-08 (×11): qty 8

## 2013-01-08 MED ORDER — MORPHINE SULFATE 4 MG/ML IJ SOLN
4.0000 mg | INTRAMUSCULAR | Status: AC | PRN
  Administered 2013-01-08 – 2013-01-09 (×2): 4 mg via INTRAVENOUS
  Filled 2013-01-08 (×2): qty 1

## 2013-01-08 MED ORDER — INFLUENZA VAC SPLIT QUAD 0.5 ML IM SUSP
0.5000 mL | INTRAMUSCULAR | Status: AC
  Administered 2013-01-09: 0.5 mL via INTRAMUSCULAR
  Filled 2013-01-08: qty 0.5

## 2013-01-08 MED ORDER — NITROGLYCERIN 0.4 MG SL SUBL
0.4000 mg | SUBLINGUAL_TABLET | SUBLINGUAL | Status: DC | PRN
  Administered 2013-01-09 – 2013-01-16 (×6): 0.4 mg via SUBLINGUAL
  Filled 2013-01-08 (×3): qty 25

## 2013-01-08 MED ORDER — SODIUM CHLORIDE 0.9 % IJ SOLN
3.0000 mL | Freq: Two times a day (BID) | INTRAMUSCULAR | Status: DC
  Administered 2013-01-08 – 2013-01-18 (×15): 3 mL via INTRAVENOUS

## 2013-01-08 MED ORDER — ASPIRIN 325 MG PO TABS
325.0000 mg | ORAL_TABLET | Freq: Every day | ORAL | Status: DC
  Administered 2013-01-09 – 2013-01-12 (×4): 325 mg via ORAL
  Filled 2013-01-08 (×4): qty 1

## 2013-01-08 MED ORDER — INSULIN ASPART 100 UNIT/ML ~~LOC~~ SOLN
6.0000 [IU] | Freq: Three times a day (TID) | SUBCUTANEOUS | Status: DC
  Administered 2013-01-09 – 2013-01-14 (×12): 6 [IU] via SUBCUTANEOUS

## 2013-01-08 MED ORDER — METHOCARBAMOL 500 MG PO TABS
500.0000 mg | ORAL_TABLET | Freq: Three times a day (TID) | ORAL | Status: AC | PRN
  Administered 2013-01-08 – 2013-01-12 (×6): 500 mg via ORAL
  Filled 2013-01-08 (×6): qty 1

## 2013-01-08 MED ORDER — MORPHINE SULFATE 4 MG/ML IJ SOLN
6.0000 mg | INTRAMUSCULAR | Status: DC | PRN
  Administered 2013-01-08: 6 mg via INTRAVENOUS
  Filled 2013-01-08: qty 2

## 2013-01-08 MED ORDER — ONDANSETRON HCL 4 MG/2ML IJ SOLN
4.0000 mg | Freq: Four times a day (QID) | INTRAMUSCULAR | Status: DC | PRN
  Administered 2013-01-12: 4 mg via INTRAVENOUS
  Filled 2013-01-08: qty 2

## 2013-01-08 MED ORDER — HEPARIN SODIUM (PORCINE) 5000 UNIT/ML IJ SOLN
5000.0000 [IU] | Freq: Three times a day (TID) | INTRAMUSCULAR | Status: DC
  Administered 2013-01-08 – 2013-01-13 (×16): 5000 [IU] via SUBCUTANEOUS
  Filled 2013-01-08 (×18): qty 1

## 2013-01-08 MED ORDER — SODIUM CHLORIDE 0.9 % IJ SOLN
3.0000 mL | Freq: Two times a day (BID) | INTRAMUSCULAR | Status: DC
  Administered 2013-01-08: 3 mL via INTRAVENOUS

## 2013-01-08 MED ORDER — INSULIN NPH (HUMAN) (ISOPHANE) 100 UNIT/ML ~~LOC~~ SUSP: 110.0000 [IU] | Freq: Two times a day (BID) | SUBCUTANEOUS | Status: DC

## 2013-01-08 MED ORDER — POTASSIUM CHLORIDE CRYS ER 20 MEQ PO TBCR
20.0000 meq | EXTENDED_RELEASE_TABLET | Freq: Every day | ORAL | Status: DC
  Administered 2013-01-08 – 2013-01-13 (×6): 20 meq via ORAL
  Filled 2013-01-08 (×6): qty 1

## 2013-01-08 MED ORDER — INSULIN NPH (HUMAN) (ISOPHANE) 100 UNIT/ML ~~LOC~~ SUSP
100.0000 [IU] | Freq: Two times a day (BID) | SUBCUTANEOUS | Status: DC
  Administered 2013-01-08: 100 [IU] via SUBCUTANEOUS
  Filled 2013-01-08: qty 10

## 2013-01-08 MED ORDER — INSULIN NPH (HUMAN) (ISOPHANE) 100 UNIT/ML ~~LOC~~ SUSP
SUBCUTANEOUS | Status: AC
  Filled 2013-01-08: qty 10

## 2013-01-08 MED ORDER — NITROGLYCERIN 0.4 MG SL SUBL
0.4000 mg | SUBLINGUAL_TABLET | Freq: Once | SUBLINGUAL | Status: AC
  Administered 2013-01-08: 0.4 mg via SUBLINGUAL
  Filled 2013-01-08: qty 25

## 2013-01-08 NOTE — ED Notes (Signed)
Pt received 2nd ntg sl at 1543, states pain to chest "a little" better but still hurts with breathing. 3rd ntg sl given at 1549. Stated pain to chest "starting to ease off". Pt more restfull and bipap on. Pt had approx 15 second episode of not responding. Lasix was being pushed, pt slumped alittle to right side. Called name with light touch,pt finally responded after approx 15 seconds, alert/orietned. States that has beenhappened, "everything just goes black". Son at bedside and updated.

## 2013-01-08 NOTE — ED Provider Notes (Signed)
CSN: [id number]     Arrival date & time 01/08/13  1442 History  This chart was scribed for Timothy Razor, MD by Bennett Scrape, ED Scribe. This patient was seen in room APA11/APA11 and the patient's care was started at 3:02 PM.   Chief Complaint  Patient presents with  . Shortness of Breath    Patient is a 62 y.o. male presenting with shortness of breath. The history is provided by the patient. No language interpreter was used.  Shortness of Breath Severity:  Severe Onset quality:  Gradual Duration:  5 days Timing:  Constant Progression:  Worsening Chronicity:  Chronic Relieved by:  Nothing Worsened by:  Activity (laying flat) Ineffective treatments:  Oxygen and sitting up Associated symptoms: chest pain and cough   Associated symptoms: no fever and no headaches     HPI Comments: Timothy Trujillo is a 62 y.o. male who presents to the Emergency Department brought in by ambulance complaining of chronic SOB that has gradually increased over the past 5 days. 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 He reports that the SOB is usually aggravated with ambulation and laying flat. He states that most times has to increase it to 4 L during ambulation. He also reports brief episodes of LOC that he states started like a black cloud with gradual blacking out of vision since the increase in SOB. He reports ongoing dizziness prior to today but denies any ongoing syncopal episodes. He also reports CP but admits that he takes NTG between 4 to 6 times a week. He denies any changes in the more recent CP episodes. He reports a h/o MI with cardiac stents. He reports recent chills, cough and chronic leg swelling with no changes but denies fevers, melena and hematochezia. He also reports shoulder and bilateral hip pain that started after a fall several weeks ago that has been persistent since.  Cardiologist is in [place], sees them  once a year.  Past Medical History  Diagnosis Date  . Diabetes mellitus   . Cerebral artery occlusion   . Heart disease   . Obesity   . Hyperlipidemia   . Peripheral vascular disease   . Hypertension   . Shortness of breath   . Angina   . Asthma   . Myocardial infarction   . Coronary artery disease   . Stroke   . Headache(784.0)   . Arthritis   . Anxiety   . COPD (chronic obstructive pulmonary disease)   . Pneumonia   . Depression    Past Surgical History  Procedure Laterality Date  . Coronary artery bypass graft    . Coronary stent placement  2008    CABG grafts closed  . Joint replacement    . Cholecystectomy    . Eye surgery    . I&d extremity  06/12/2011    Procedure: IRRIGATION AND DEBRIDEMENT EXTREMITY;  Surgeon: Javier Docker, MD;  Location: [facility] OR;  Service: Orthopedics;  Laterality: Right;  I&D Right [place]   Family History  Problem Relation Age of Onset  . Dementia Mother   . Arthritis Mother   . Heart disease Father   . Heart disease Brother    History  Substance Use Topics  . Smoking status: Former Smoker -- 3.00 packs/day    Quit date: 04/09/1983  . Smokeless tobacco: Never Used  . Alcohol Use: No    Review  of Systems  Constitutional: Positive for chills. Negative for fever.  Respiratory: Positive for cough and shortness of breath.   Cardiovascular: Positive for chest pain and leg swelling.  Musculoskeletal: Positive for arthralgias.  Skin: Negative for wound.  Neurological: Positive for dizziness and syncope. Negative for headaches.  All other systems reviewed and are negative.    Allergies  Stadol  Home Medications   Current Outpatient Rx  Name  Route  Sig  Dispense  Refill  . Ascorbic Acid (VITAMIN C PO)   Oral   Take 1 tablet by mouth daily.         [name] aspirin 325 MG tablet   Oral   Take 325 mg by mouth daily.         . Cyanocobalamin (VITAMIN B 12 PO)   Oral   Take 1 tablet by mouth daily.         [name] dextrose 5 % SOLN  50 mL with cefTRIAXone 1 G SOLR 1 g   Intravenous   Inject 1 g into the vein daily.   30 vial   1   . ezetimibe (ZETIA) 10 MG tablet   Oral   Take 10 mg by mouth daily.         . furosemide (LASIX) 80 MG tablet   Oral   Take 1 tablet (80 mg total) by mouth 2 (two) times daily.   62 tablet   3   . insulin NPH (HUMULIN N,NOVOLIN N) 100 UNIT/ML injection   Subcutaneous   Inject 110 Units into the skin 2 (two) times daily.         . isosorbide mononitrate (IMDUR) 30 MG 24 hr tablet   Oral   Take 30 mg by mouth daily.         . metoprolol (TOPROL-XL) 200 MG 24 hr tablet   Oral   Take 200 mg by mouth 2 (two) times daily.         . niacin (NIASPAN) 1000 MG CR tablet   Oral   Take 1,000 mg by mouth at bedtime.         . nitroGLYCERIN (NITROSTAT) 0.4 MG SL tablet   Sublingual   Place 0.4 mg under the tongue every 5 (five) minutes as needed.         . simvastatin (ZOCOR) 80 MG tablet   Oral   Take 80 mg by mouth daily.         . sodium chloride 0.9 % SOLN 50 mL with ertapenem 1 G SOLR 1 g   Intravenous   Inject 1 g into the vein daily.   10 g   6   . sodium chloride 0.9 % SOLN 500 mL with vancomycin 1000 MG SOLR 1,750 mg   Intravenous   Inject 1,750 mg into the vein every 12 (twelve) hours.   60 vial   1   . VITAMIN E PO   Oral   Take 1 tablet by mouth daily.          Triage Vitals: BP 149/88  Pulse 109  Temp(Src) 98.7 F (37.1 C) (Oral)  Resp 23  SpO2 100%  Physical Exam  Nursing note and vitals reviewed. Constitutional: He is oriented to person, place, and time. He appears well-developed and well-nourished. No distress.  HENT:  Head: Normocephalic and atraumatic.  Eyes: Conjunctivae and EOM are normal.  Neck: Normal range of motion. Neck supple. No tracheal deviation present.  Cardiovascular: Normal rate, regular rhythm and  normal heart sounds.   No murmur heard. Pulmonary/Chest: Tachypnea noted. No respiratory distress. He has no  wheezes. He has rales.  Crackles in bilateral bases, tachypneic   Abdominal: Soft. Bowel sounds are normal. There is no tenderness.  Musculoskeletal: Normal range of motion. He exhibits edema (mild pitting lower extremity edema, left worse than right).  Neurological: He is alert and oriented to person, place, and time. No cranial nerve deficit.  Skin: Skin is warm and dry.  Psychiatric: He has a normal mood and affect. His behavior is normal.    ED Course  Procedures (including critical care time)  CRITICAL CARE Performed by: Timothy Trujillo  Total critical care time: 35 minutes  Critical care time was exclusive of separately billable procedures and treating other patients. Critical care was necessary to treat or prevent imminent or life-threatening deterioration. Critical care was time spent personally by me on the following activities: development of treatment plan with patient and/or surrogate as well as nursing, discussions with consultants, evaluation of patient's response to treatment, examination of patient, obtaining history from patient or surrogate, ordering and performing treatments and interventions, ordering and review of laboratory studies, ordering and review of radiographic studies, pulse oximetry and re-evaluation of patient's condition.   DIAGNOSTIC STUDIES: Oxygen Saturation is 100% on 4L [place], normal by my interpretation.    COORDINATION OF CARE: 3:06 PM-Discussed treatment plan which includes CXR, CBC panel, CMP and d-dimer with pt at bedside and pt agreed to plan. Advised pt that admission is most likely and pt agreed to admission.  Labs Review Labs Reviewed  CBC WITH DIFFERENTIAL - Abnormal; Notable for the following:    Neutrophils Relative % 80 (*)    Lymphocytes Relative 11 (*)    All other components within normal limits  BASIC METABOLIC PANEL - Abnormal; Notable for the following:    Sodium 134 (*)    Chloride 94 (*)    Glucose, Bld 296 (*)    GFR calc  non Af Amer 66 (*)    GFR calc Af Amer 77 (*)    All other components within normal limits  D-DIMER, QUANTITATIVE - Abnormal; Notable for the following:    D-Dimer, Quant 0.95 (*)    All other components within normal limits  TROPONIN I   EKG:  Rhythm: Sinus tachycardia Vent. rate 109 BPM PR interval 158 ms QRS duration 100 ms QT/QTc 364/490 ms Left atrial enlargement Poor R wave progression. ST segments: Nonspecific ST changes Comparison: Similar appearance to EKG from March 2013   Imaging Review Dg Chest Portable 1 View  01/08/2013   CLINICAL DATA:  Shortness of breath  EXAM: PORTABLE CHEST - 1 VIEW  COMPARISON:  08/14/2012  FINDINGS: Cardiac shadow remains enlarged. Postsurgical changes are again noted. Increased vascular congestion and pulmonary edema is noted. No focal infiltrate is seen.  IMPRESSION: Severe congestive failure with pulmonary edema.   Electronically Signed   By: Alcide Clever M.D.   On: 01/08/2013 15:19    MDM   1. Acute congestive heart failure   2. Respiratory distress     62 year old male with worsening dyspnea. History, exam and workup consistent with heart failure. A d-dimer was obtained with labs initially ordered by nursing. It is elevated, but will not pursue CT angiography at this time as his symptoms very consistent with heart failure. Patient was placed on BiPAP and nitroglycerin drip was started. Diuretics. Patient already some clinical improvement in the emergency room as he reports improved symptoms and  he is less drowsy. Patient additionally reports episodes of "blacking out." Unsure if this is true syncope versus him just being very drowsy. H had one of these episodes when I was in the room with him. When I raised my voice and gave light tactile stimulation, he startled as if was asleep.and was immediately alert and responding to questioning appropriately. Will discuss with medicine formation. PCP is Dr. Juanetta Gosling. Cardiologist out of [place].   Timothy Razor, MD 01/08/13 [phone]

## 2013-01-08 NOTE — ED Notes (Signed)
Pt c/o sob x 5 days. States worse today. C/o all over body cramping yesterday. Pt alert/oriented. ems states he would go in and out of consciousness en route. gen weakness observed. Lethargy noted. Follows commands. Color slightly pale/gray. Mm wet. No peripheral edema noted. bs wnl. Apical pulse strong and regular. cbg en route 309

## 2013-01-08 NOTE — ED Notes (Signed)
ntg drip up to 62mcg/kg/min.

## 2013-01-08 NOTE — H&P (Signed)
[facility] History and Physical  Timothy Trujillo ZOX:[id number] DOB: December 28, 1950 DOA: 01/08/2013  Referring physician: Dr. Juleen China, ER. PCP: Fredirick Maudlin, MD    Chief Complaint: Dyspnea.  HPI: Timothy Trujillo is a 61 y.o. male who describes dyspnea for approximately one week, worsening over the week. He describes orthopnea, paroxysmal nocturnal dyspnea and also has had chest tightness intermittently throughout the week. When he came to the emergency room, he was in severe respiratory distress requiring BiPAP. Chest x-ray showed him to be in severe pulmonary edema and he has been given intravenous Lasix, is on nitroglycerin drip and is on BiPAP. His symptoms have improved since hospitalization. He is now being admitted for further management. He does have a history of coronary artery disease, having had a CABG about 20 years ago. After this he has also had stenting because the grafts apparently closed of, the records are not available for this. The last echocardiogram we have was done of [facility] in September 2013 which showed ejection fraction of 25-30%.   Review of Systems:   Apart from history of present illness, other systems negative.  Past Medical History  Diagnosis Date  . Diabetes mellitus   . Cerebral artery occlusion   . Heart disease   . Obesity   . Hyperlipidemia   . Peripheral vascular disease   . Hypertension   . Shortness of breath   . Angina   . Asthma   . Myocardial infarction   . Coronary artery disease   . Stroke   . Headache(784.0)   . Arthritis   . Anxiety   . COPD (chronic obstructive pulmonary disease)   . Pneumonia   . Depression    Past Surgical History  Procedure Laterality Date  . Coronary artery bypass graft    . Coronary stent placement  2008    CABG grafts closed  . Joint replacement    . Cholecystectomy    . Eye surgery    . I&d extremity  06/12/2011    Procedure: IRRIGATION AND DEBRIDEMENT EXTREMITY;  Surgeon: Javier Docker,  MD;  Location: [facility] OR;  Service: Orthopedics;  Laterality: Right;  I&D Right [place]   Social History:  reports that he quit smoking about 29 years ago. He has never used smokeless tobacco. He reports that he does not drink alcohol or use illicit drugs.   Allergies  Allergen Reactions  . Stadol [Butorphanol Tartrate] Other (See Comments)    "makes him crazy"    Family History  Problem Relation Age of Onset  . Dementia Mother   . Arthritis Mother   . Heart disease Father   . Heart disease Brother       Prior to Admission medications   Medication Sig Start Date End Date Taking? Authorizing Provider  aspirin 325 MG tablet Take 325 mg by mouth daily.   Yes Historical Provider, MD  furosemide (LASIX) 80 MG tablet Take 80 mg by mouth 2 (two) times daily.   Yes Historical Provider, MD  insulin NPH (HUMULIN N,NOVOLIN N) 100 UNIT/ML injection Inject 110 Units into the skin 2 (two) times daily.   Yes Historical Provider, MD  KLOR-CON M20 20 MEQ tablet Take 20 mEq by mouth daily. 01/07/13   Historical Provider, MD  nitroGLYCERIN (NITROSTAT) 0.4 MG SL tablet Place 0.4 mg under the tongue every 5 (five) minutes as needed for chest pain.     Historical Provider, MD   Physical Exam: Filed Vitals:   01/08/13 1630  BP:  135/92  Pulse: 111  Temp:   Resp:      General:  He looks more comfortable with her respiration them the description I was given when he first presented. He is on BiPAP. He is able to talk to me in sentences. There is no peripheral or central cyanosis. His peripheries are somewhat cool.  Eyes: No pallor. No jaundice.  ENT: Unremarkable.  Neck: No lymphadenopathy.  Cardiovascular: Heart sounds are present in sinus rhythm. There are no obvious murmurs. There is no gallop rhythm. Jugular venous pressure does not appear to be raised. There is no significant peripheral pitting edema.  Respiratory: There are fine inspiratory crackles in the mid and lower zones more the right than  the left. There is no bronchial breathing.  Abdomen: Soft, nontender.  Skin: No rash.  Musculoskeletal: No acute joint abnormalities.  Psychiatric: Appropriate affect.  Neurologic: Alert and orientated without any focal neurological signs.  Labs on Admission:  Basic Metabolic Panel:  Recent Labs Lab 01/08/13 1527  NA 134*  K 4.4  CL 94*  CO2 28  GLUCOSE 296*  BUN 20  CREATININE 1.14  CALCIUM 10.2       CBC:  Recent Labs Lab 01/08/13 1527  WBC 8.1  NEUTROABS 6.5  HGB 13.3  HCT 39.7  MCV 90.6  PLT 261   Cardiac Enzymes:  Recent Labs Lab 01/08/13 1527  TROPONINI <0.30       Radiological Exams on Admission: Dg Chest Portable 1 View  01/08/2013   CLINICAL DATA:  Shortness of breath  EXAM: PORTABLE CHEST - 1 VIEW  COMPARISON:  08/14/2012  FINDINGS: Cardiac shadow remains enlarged. Postsurgical changes are again noted. Increased vascular congestion and pulmonary edema is noted. No focal infiltrate is seen.  IMPRESSION: Severe congestive failure with pulmonary edema.   Electronically Signed   By: Alcide Clever M.D.   On: 01/08/2013 15:19    EKG: Independently reviewed. Normal sinus rhythm with tachycardia. Poor R wave progression. No acute ST elevation.  Assessment/Plan   1. Acute systolic congestive heart failure. 2. Coronary artery disease with history of CABG previously and stenting procedure subsequently. 3. Type 2 diabetes mellitus on insulin. 4. Hypertension. 5. 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 6. Obesity.  Plan: 1. Admit to step down unit.  2. Intravenous diuretics. 3. Continue with nitroglycerin drip and BiPAP for tonight. Consider discontinuing BiPAP probably tomorrow. 4. Serial cardiac enzymes. 5. Echocardiogram. 6. Cardiology consultation.  Further recommendations will depend on patient's hospital progress. if consultant consulted, please document name and  whether formally or informally consulted  Code Status: Full code  Family Communication: Discuss plan with patient at the bedside.  Disposition Plan: Home when medically stable.   Time spent: 60 minutes.  Wilson Singer [facility] Pager [phone].  If 7PM-7AM, please contact night-coverage [email or web address] Password [facility] 01/08/2013, 5:32 PM

## 2013-01-09 ENCOUNTER — Inpatient Hospital Stay (HOSPITAL_COMMUNITY): Payer: Medicare Other

## 2013-01-09 LAB — COMPREHENSIVE METABOLIC PANEL
ALT: 8 U/L (ref 0–53)
Albumin: 3.5 g/dL (ref 3.5–5.2)
Alkaline Phosphatase: 83 U/L (ref 39–117)
BUN: 19 mg/dL (ref 6–23)
CO2: 29 mEq/L (ref 19–32)
Chloride: 93 mEq/L — ABNORMAL LOW (ref 96–112)
Creatinine, Ser: 1.15 mg/dL (ref 0.50–1.35)
GFR calc Af Amer: 76 mL/min — ABNORMAL LOW (ref >90)
GFR calc non Af Amer: 65 mL/min — ABNORMAL LOW (ref >90)
Glucose, Bld: 151 mg/dL — ABNORMAL HIGH (ref 70–99)
Potassium: 3.9 mEq/L (ref 3.5–5.1)
Total Bilirubin: 0.5 mg/dL (ref 0.3–1.2)
Total Protein: 7 g/dL (ref 6.0–8.3)

## 2013-01-09 LAB — CBC
Hemoglobin: 13.6 g/dL (ref 13.0–17.0)
MCHC: 33.5 g/dL (ref 30.0–36.0)
Platelets: 264 10*3/uL (ref 150–400)
RDW: 15 % (ref 11.5–15.5)
WBC: 10.7 10*3/uL — ABNORMAL HIGH (ref 4.0–10.5)

## 2013-01-09 LAB — BLOOD GAS, ARTERIAL
O2 Saturation: 94.3 %
Patient temperature: 37
TCO2: 25.3 mmol/L (ref 0–100)
pH, Arterial: 7.432 (ref 7.350–7.450)

## 2013-01-09 LAB — GLUCOSE, CAPILLARY
Glucose-Capillary: 79 mg/dL (ref 70–99)
Glucose-Capillary: 97 mg/dL (ref 70–99)
Glucose-Capillary: 140 mg/dL — ABNORMAL HIGH (ref 70–99)
Glucose-Capillary: 123 mg/dL — ABNORMAL HIGH (ref 70–99)
Glucose-Capillary: 154 mg/dL — ABNORMAL HIGH (ref 70–99)

## 2013-01-09 MED ORDER — HYDROCODONE-ACETAMINOPHEN 5-325 MG PO TABS
1.0000 | ORAL_TABLET | ORAL | Status: DC | PRN
  Administered 2013-01-09 – 2013-01-12 (×9): 2 via ORAL
  Administered 2013-01-12: 1 via ORAL
  Administered 2013-01-13 – 2013-01-14 (×6): 2 via ORAL
  Administered 2013-01-15: 1 via ORAL
  Administered 2013-01-15 – 2013-01-16 (×4): 2 via ORAL
  Filled 2013-01-09 (×18): qty 2
  Filled 2013-01-09 (×3): qty 1

## 2013-01-09 NOTE — Progress Notes (Signed)
Pt taken off BiPAP as he states, " mask is leaking and would like to try it off for now". WIll try PT on 3lpm/[place] and see how he feels. Will continue to monitor PT.

## 2013-01-09 NOTE — Progress Notes (Signed)
At 1345 pt complained of chest discomfort and chest pressure. Pt described pain as a dull ache and rated chest discomfort of 5 out of 10. SL Nitro given at 1347. Pt states that "I have this discomfort at home often and my heart doctor tells me to take a nitro."  At 1352 pain reassessed and pt complained of no pain or chest discomfort. Pt again stated "this happens at home and I feel better after taking a Nitro."

## 2013-01-09 NOTE — Progress Notes (Signed)
Subjective: Patient was admitted yesterday due to dyspnea secondary to acute exacerbation of systolic CHF. His breathing is better today. He is complaining of lt shoulder pain. He fell and injury his shoulder.  Objective: Vital signs in last 24 hours: Temp:  [97.9 F (36.6 C)-98.7 F (37.1 C)] 97.9 F (36.6 C) (10/04 0800) Pulse Rate:  [94-117] 107 (10/04 1100) Resp:  [12-28] 14 (10/04 0900) BP: (99-150)/(57-101) 113/71 mmHg (10/04 1100) SpO2:  [91 %-100 %] 96 % (10/04 1100) Weight:  [130.1 kg (286 lb 13.1 oz)-130.9 kg (288 lb 9.3 oz)] 130.1 kg (286 lb 13.1 oz) (10/04 0500) Weight change:  Last BM Date: 01/07/13  Intake/Output from previous day: 10/03 0701 - 10/04 0700 In: 86.5 [I.V.:86.5] Out: 5250 [Urine:5250]  PHYSICAL EXAM General appearance: alert and mild distress Resp: diminished breath sounds bilaterally and rhonchi bilaterally Cardio: S1, S2 normal GI: soft, non-tender; bowel sounds normal; no masses,  no organomegaly Extremities: extremities normal, atraumatic, no cyanosis or edema and tenderness of the lt shoulder  Lab Results:    @labtest @ [facility]  Recent Labs  01/09/13 0500  PHART 7.432  PO2ART 72.2*  TCO2 25.3  HCO3 28.8*   CULTURES Recent Results (from the past 240 hour(s))  MRSA PCR SCREENING     Status: None   Collection Time    01/08/13  7:16 PM      Result Value Range Status   MRSA by PCR NEGATIVE  NEGATIVE Final   Comment:            The GeneXpert MRSA Assay (FDA     approved for NASAL specimens     only), is one component of a     comprehensive MRSA colonization     surveillance program. It is not     intended to diagnose MRSA     infection nor to guide or     monitor treatment for     MRSA infections.   Studies/Results: Dg Chest Port 1 View  01/09/2013   CLINICAL DATA:  Followup congestive heart failure.  EXAM: PORTABLE CHEST - 1 VIEW  COMPARISON:  01/08/2013  FINDINGS: Lungs are somewhat hypoinflated with continued hazy perihilar  opacification likely interstitial edema although cannot exclude infection. There has been mild interval improvement in this airspace process with slightly better aeration. Subtle blunting of the right costophrenic angle likely a small amount of right pleural fluid. Mild stable cardiomegaly. Remainder of the exam is unchanged.  IMPRESSION: Interval improvement in mild hazy perihilar opacification likely improving interstitial edema, although cannot exclude infection. Possible small amount of right pleural fluid.  Stable cardiomegaly.   Electronically Signed   By: Elberta Fortis M.D.   On: 01/09/2013 08:52   Dg Chest Portable 1 View  01/08/2013   CLINICAL DATA:  Shortness of breath  EXAM: PORTABLE CHEST - 1 VIEW  COMPARISON:  08/14/2012  FINDINGS: Cardiac shadow remains enlarged. Postsurgical changes are again noted. Increased vascular congestion and pulmonary edema is noted. No focal infiltrate is seen.  IMPRESSION: Severe congestive failure with pulmonary edema.   Electronically Signed   By: Alcide Clever M.D.   On: 01/08/2013 15:19    Medications: I have reviewed the patient's current medications.  Assesment: Active Problems:   Heart disease   Obesity   Diabetes mellitus   Hypertension   CHF (congestive heart failure)   Cerebrovascular disease    Plan: Medications reviewed Continue IV diuretics Will do BMP X-ray of the lt shoulder jopint.     LOS:  1 day   Terrill Alperin 01/09/2013, 11:24 AM

## 2013-01-10 LAB — GLUCOSE, CAPILLARY
Glucose-Capillary: 225 mg/dL — ABNORMAL HIGH (ref 70–99)
Glucose-Capillary: 152 mg/dL — ABNORMAL HIGH (ref 70–99)
Glucose-Capillary: 151 mg/dL — ABNORMAL HIGH (ref 70–99)

## 2013-01-10 MED ORDER — LIVING BETTER WITH HEART FAILURE BOOK
Freq: Once | Status: DC
  Filled 2013-01-10: qty 1

## 2013-01-10 MED ORDER — ALBUTEROL SULFATE (5 MG/ML) 0.5% IN NEBU: 2.5000 mg | INHALATION_SOLUTION | RESPIRATORY_TRACT | Status: DC | PRN

## 2013-01-10 MED ORDER — SENNA 8.6 MG PO TABS
1.0000 | ORAL_TABLET | Freq: Every day | ORAL | Status: DC | PRN
  Administered 2013-01-10 – 2013-01-14 (×4): 8.6 mg via ORAL
  Filled 2013-01-10 (×5): qty 1

## 2013-01-10 MED ORDER — ALBUTEROL SULFATE (5 MG/ML) 0.5% IN NEBU
INHALATION_SOLUTION | RESPIRATORY_TRACT | Status: AC
  Administered 2013-01-10: 2.5 mg
  Filled 2013-01-10: qty 0.5

## 2013-01-10 MED ORDER — DOCUSATE SODIUM 100 MG PO CAPS
100.0000 mg | ORAL_CAPSULE | Freq: Every day | ORAL | Status: DC | PRN
  Administered 2013-01-10 – 2013-01-14 (×4): 100 mg via ORAL
  Filled 2013-01-10 (×4): qty 1

## 2013-01-10 NOTE — Progress Notes (Signed)
PT complains of SOB this am, given 1 time albuterol neb. No wheezes noted , only fine crackles in bases. Suspect he is still in CHF ( chronic) and has some form of sleep apnea which exacerbates CHF. PT made need some form of CPAP/BiPAP at night. Anxiety may also be issue.

## 2013-01-10 NOTE — Progress Notes (Signed)
Patient c/o chest heaviness/pain radiating to neck. One sublingual nitro 0.4 mg given, with resulting relief. EKG obtained, appears unchanged. Page sent to Dr Felecia Shelling, awaiting return call. Patient states that he takes up to 8 nitro per week, and his PCP is aware.

## 2013-01-10 NOTE — Progress Notes (Signed)
Subjective: Patient continued to have intermittent episode of shortness of breath. He is on Iv lasix and diuresing well..  Objective: Vital signs in last 24 hours: Temp:  [97.9 F (36.6 C)-98.4 F (36.9 C)] 98.4 F (36.9 C) (10/05 0800) Pulse Rate:  [101-119] 109 (10/05 0803) Resp:  [11-24] 17 (10/05 0803) BP: (104-148)/(53-98) 134/89 mmHg (10/05 0800) SpO2:  [71 %-100 %] 99 % (10/05 0803) Weight:  [130.5 kg (287 lb 11.2 oz)] 130.5 kg (287 lb 11.2 oz) (10/05 0500) Weight change: -0.4 kg (-14.1 oz) Last BM Date: 01/07/13  Intake/Output from previous day: 10/04 0701 - 10/05 0700 In: 1634.5 [P.O.:1630; I.V.:4.5] Out: 2250 [Urine:2250]  PHYSICAL EXAM General appearance: alert and mild distress Resp: diminished breath sounds bilaterally and rhonchi bilaterally Cardio: S1, S2 normal GI: soft, non-tender; bowel sounds normal; no masses,  no organomegaly Extremities: extremities normal, atraumatic, no cyanosis or edema and tenderness of the lt shoulder  Lab Results:    @labtest @ [facility]  Recent Labs  01/09/13 0500  PHART 7.432  PO2ART 72.2*  TCO2 25.3  HCO3 28.8*   CULTURES Recent Results (from the past 240 hour(s))  MRSA PCR SCREENING     Status: None   Collection Time    01/08/13  7:16 PM      Result Value Range Status   MRSA by PCR NEGATIVE  NEGATIVE Final   Comment:            The GeneXpert MRSA Assay (FDA     approved for NASAL specimens     only), is one component of a     comprehensive MRSA colonization     surveillance program. It is not     intended to diagnose MRSA     infection nor to guide or     monitor treatment for     MRSA infections.   Studies/Results: Dg Chest Port 1 View  01/09/2013   CLINICAL DATA:  Followup congestive heart failure.  EXAM: PORTABLE CHEST - 1 VIEW  COMPARISON:  01/08/2013  FINDINGS: Lungs are somewhat hypoinflated with continued hazy perihilar opacification likely interstitial edema although cannot exclude infection. There has  been mild interval improvement in this airspace process with slightly better aeration. Subtle blunting of the right costophrenic angle likely a small amount of right pleural fluid. Mild stable cardiomegaly. Remainder of the exam is unchanged.  IMPRESSION: Interval improvement in mild hazy perihilar opacification likely improving interstitial edema, although cannot exclude infection. Possible small amount of right pleural fluid.  Stable cardiomegaly.   Electronically Signed   By: Elberta Fortis M.D.   On: 01/09/2013 08:52   Dg Chest Portable 1 View  01/08/2013   CLINICAL DATA:  Shortness of breath  EXAM: PORTABLE CHEST - 1 VIEW  COMPARISON:  08/14/2012  FINDINGS: Cardiac shadow remains enlarged. Postsurgical changes are again noted. Increased vascular congestion and pulmonary edema is noted. No focal infiltrate is seen.  IMPRESSION: Severe congestive failure with pulmonary edema.   Electronically Signed   By: Alcide Clever M.D.   On: 01/08/2013 15:19   Dg Shoulder Left Port  01/09/2013   *RADIOLOGY REPORT*  Clinical Data: Post fall 1 week ago now with persistent anterior shoulder pain, initial encounter.  PORTABLE LEFT SHOULDER - 2+ VIEW  Comparison: None.  Findings: Osteopenia.  No definite fracture or dislocation. Given obliquity, mild degenerative changes of the left glenohumeral joint is suspected with joint space loss, subchondral sclerosis and osteophytosis.  Suspect mild degenerative change of the [facility] joint. Potential  minimal amount of calcific tendonitis.  Limited visualization of adjacent thorax is normal.  Regional soft tissues are normal.  IMPRESSION: 1.  Osteopenia without definite acute finding. 2.  Mild degenerative change of the glenohumeral and acromioclavicular joints with possible mild calcific tendonitis. Further evaluation with shoulder MRI may be performed as clinically indicated.   Original Report Authenticated By: Tacey Ruiz, MD    Medications: I have reviewed the patient's current  medications.  Assesment: Active Problems:   Heart disease   Obesity   Diabetes mellitus   Hypertension   CHF (congestive heart failure)   Cerebrovascular disease    Plan: Medications reviewed Continue IV diuretics Will do BMP    LOS: 2 days   Ashlee Player 01/10/2013, 10:21 AM

## 2013-01-10 NOTE — Progress Notes (Signed)
Placed Pt on BiPAP for sleep, he is off BiPAP for acute CHF but I suspect he has problems sleeping with apnea or oxygen as he awaken's with SOB and anxiety. Will try him on nasal BiPAP 12/4 5lp/m bled in. Hopefully this will give [place] an idea about his morning SOB. Also will give a PRN albuterol order as he has a 30-35 pk year smoke hx. No wheezes have been noted only crackles. Pt states it does help with SOB. Will monitor.

## 2013-01-11 DIAGNOSIS — I369 Nonrheumatic tricuspid valve disorder, unspecified: Secondary | ICD-10-CM

## 2013-01-11 LAB — GLUCOSE, CAPILLARY: Glucose-Capillary: 291 mg/dL — ABNORMAL HIGH (ref 70–99)

## 2013-01-11 LAB — BASIC METABOLIC PANEL
CO2: 30 mEq/L (ref 19–32)
Calcium: 10.1 mg/dL (ref 8.4–10.5)
Chloride: 94 mEq/L — ABNORMAL LOW (ref 96–112)
Creatinine, Ser: 1.3 mg/dL (ref 0.50–1.35)
Glucose, Bld: 205 mg/dL — ABNORMAL HIGH (ref 70–99)
Sodium: 135 mEq/L (ref 135–145)

## 2013-01-11 LAB — HEMOGLOBIN A1C: Mean Plasma Glucose: 321 mg/dL — ABNORMAL HIGH (ref <117)

## 2013-01-11 NOTE — Progress Notes (Signed)
Inpatient Diabetes Program Recommendations  AACE/ADA: New Consensus Statement on Inpatient Glycemic Control (2013)  Target Ranges:  Prepandial:   less than 140 mg/dL      Peak postprandial:   less than 180 mg/dL (1-2 hours)      Critically ill patients:  140 - 180 mg/dL   Results for Timothy Trujillo, Timothy Trujillo (MRN [id number]) as of 01/11/2013 12:37  Ref. Range 01/09/2013 01:48 01/09/2013 07:49 01/09/2013 11:36 01/09/2013 16:41 01/09/2013 21:07 01/10/2013 07:40 01/10/2013 11:31 01/10/2013 16:38 01/10/2013 21:26 01/11/2013 07:58 01/11/2013 11:15  Glucose-Capillary Latest Range: 70-99 mg/dL 79 97 782 (H) 956 (H) 213 (H) 225 (H) 195 (H) 152 (H) 151 (H) 180 (H) 291 (H)    Inpatient Diabetes Program Recommendations Insulin - Basal: Please discontinue NPH while inpatient and at time of discharge, please re-evaluate NPH dose.  Note: Patient has a history of diabetes and according to the medication list prior to admission he takes NPH 110 units BID as an outpatient for diabetes management.  Currently, patient is ordered to receive NPH 100 units BID, Novolog 0-20 AC, Novolog 0-5 HS, and Novolog 6 units TID for inpatient glycemic control.  Noted that patient has only received NPH 100 units once since admission and that was on 01/08/13 @ 21:45.  Blood glucose ranged from 151-225 mg/dl on 08/6, 57-846 mg/dl on 96/2, and from 952-841 mg/dl on 32/4.  In talking with the patient he reports that over the past 2 weeks he has not been taking the NPH very often because he only takes it when his blood glucose is over 200 mg/dl before breakfast or supper.  According to the patient he has lost over 100 lbs over the past 2 years and his blood sugars have been better.  Last A1C in the chart was 9.4% on 06/12/2011.  Patient reports that he knows Dr. Juanetta Gosling checks his A1C but he does not remember what is last couple of A1C values were.  A1C has been ordered and results are pending.  According to the patient, his blood glucose was 79 mg/dl on 40/1 @  0:27OZ and he felt symptomatic with low blood glucose.  He reports that when his blood sugar is less than 80 mg/dl he has symptoms of hypoglycemia.  Therefore, while inpatient please discontinue NPH and continue to manage inpatient glycemic control with Novolog correction scale and meal coverage.  At time of discharge, MD may need to re-evaluate NPH home dose.  Will continue to follow as an inpatient.  Thanks, Orlando Penner, RN, MSN, CCRN Diabetes Coordinator Inpatient Diabetes Program [phone] (Team Pager) [phone] ([place] office) [phone] [facility] office)

## 2013-01-11 NOTE — Progress Notes (Signed)
Pt asked to remove BiPAP about 01:30 am, says he felt like it was smothering him. No further attempts will be made unless absolutely needed. He did sleep well with machine. Sleep study maybe beneficial.

## 2013-01-11 NOTE — Progress Notes (Signed)
*  PRELIMINARY RESULTS* Echocardiogram 2D Echocardiogram has been performed.  Timothy Trujillo 01/11/2013, 10:00 AM

## 2013-01-11 NOTE — Progress Notes (Signed)
Subjective: Patient feels better. He is resting. No fever or chills. Objective: Vital signs in last 24 hours: Temp:  [97.4 F (36.3 C)-98.3 F (36.8 C)] 97.4 F (36.3 C) (10/06 0740) Pulse Rate:  [29-117] 102 (10/06 0600) Resp:  [9-27] 16 (10/06 0600) BP: (103-171)/(64-110) 129/78 mmHg (10/06 0600) SpO2:  [84 %-97 %] 93 % (10/06 0600) Weight:  [129 kg (284 lb 6.3 oz)] 129 kg (284 lb 6.3 oz) (10/06 0500) Weight change: -1.5 kg (-3 lb 4.9 oz) Last BM Date: 01/07/13  Intake/Output from previous day: 10/05 0701 - 10/06 0700 In: -  Out: 3350 [Urine:3350]  PHYSICAL EXAM General appearance: alert and mild distress Resp: diminished breath sounds bilaterally and rhonchi bilaterally Cardio: S1, S2 normal GI: soft, non-tender; bowel sounds normal; no masses,  no organomegaly Extremities: extremities normal, atraumatic, no cyanosis or edema and tenderness of the lt shoulder  Lab Results:    @labtest @ [facility]  Recent Labs  01/09/13 0500  PHART 7.432  PO2ART 72.2*  TCO2 25.3  HCO3 28.8*   CULTURES Recent Results (from the past 240 hour(s))  MRSA PCR SCREENING     Status: None   Collection Time    01/08/13  7:16 PM      Result Value Range Status   MRSA by PCR NEGATIVE  NEGATIVE Final   Comment:            The GeneXpert MRSA Assay (FDA     approved for NASAL specimens     only), is one component of a     comprehensive MRSA colonization     surveillance program. It is not     intended to diagnose MRSA     infection nor to guide or     monitor treatment for     MRSA infections.   Studies/Results: Dg Chest Port 1 View  01/09/2013   CLINICAL DATA:  Followup congestive heart failure.  EXAM: PORTABLE CHEST - 1 VIEW  COMPARISON:  01/08/2013  FINDINGS: Lungs are somewhat hypoinflated with continued hazy perihilar opacification likely interstitial edema although cannot exclude infection. There has been mild interval improvement in this airspace process with slightly better  aeration. Subtle blunting of the right costophrenic angle likely a small amount of right pleural fluid. Mild stable cardiomegaly. Remainder of the exam is unchanged.  IMPRESSION: Interval improvement in mild hazy perihilar opacification likely improving interstitial edema, although cannot exclude infection. Possible small amount of right pleural fluid.  Stable cardiomegaly.   Electronically Signed   By: Elberta Fortis M.D.   On: 01/09/2013 08:52   Dg Shoulder Left Port  01/09/2013   *RADIOLOGY REPORT*  Clinical Data: Post fall 1 week ago now with persistent anterior shoulder pain, initial encounter.  PORTABLE LEFT SHOULDER - 2+ VIEW  Comparison: None.  Findings: Osteopenia.  No definite fracture or dislocation. Given obliquity, mild degenerative changes of the left glenohumeral joint is suspected with joint space loss, subchondral sclerosis and osteophytosis.  Suspect mild degenerative change of the [facility] joint. Potential minimal amount of calcific tendonitis.  Limited visualization of adjacent thorax is normal.  Regional soft tissues are normal.  IMPRESSION: 1.  Osteopenia without definite acute finding. 2.  Mild degenerative change of the glenohumeral and acromioclavicular joints with possible mild calcific tendonitis. Further evaluation with shoulder MRI may be performed as clinically indicated.   Original Report Authenticated By: Tacey Ruiz, MD    Medications: I have reviewed the patient's current medications.  Assesment: Active Problems:   Heart disease  Obesity   Diabetes mellitus   Hypertension   CHF (congestive heart failure)   Cerebrovascular disease    Plan: Medications reviewed Continue IV diuretics Will do BMP    LOS: 3 days   Timothy Trujillo 01/11/2013, 8:12 AM

## 2013-01-11 NOTE — Clinical Documentation Improvement (Signed)
THIS DOCUMENT IS NOT A PERMANENT PART OF THE MEDICAL RECORD  Please update your documentation with the medical record to reflect your response to this query. If you need help knowing how to do this please call [phone].  01/11/13  Dear Dr. Nahser/Associates,  A review of the patient medical record has revealed the following indicators.  Based on your clinical judgment, please clarify and document in a progress note and/or discharge summary the clinical condition associated with the following supporting information:  Admitted with Acute systolic CHF Presented to ED with shortness of breath/ "worsening dyspnea" Respirartory distress leading to Bipap History of Asthma, COPD, and Silicon poisoning  Home oxygen 3-4L/m H&P: fine inspiratory crackles middle and lower lobes R>L ABG: PH = 7.432; pCO2  43.9; pO2 = 72.2; HCO3 =28.8  Treatments BiPap Proventil nebs q4h prn  Pulse ox check with VS  Possible Clinical Conditions?  Acute Respiratory Failure Acute on Chronic Respiratory Failure Chronic Respiratory Failure Other Condition________________ Cannot Clinically Determine    You may use possible, probable, or suspect with inpatient documentation. possible, probable, suspected diagnoses MUST be documented at the time of discharge  Reviewed: Answer found in DCS by Nahser = A/C resp fail Thank Angelene Giovanni RN Clinical Documentation Specialist: [phone] [facility] Health Information Management [place]

## 2013-01-11 NOTE — Progress Notes (Signed)
UR chart review completed.  

## 2013-01-12 LAB — BASIC METABOLIC PANEL
BUN: 32 mg/dL — ABNORMAL HIGH (ref 6–23)
CO2: 30 mEq/L (ref 19–32)
Chloride: 94 mEq/L — ABNORMAL LOW (ref 96–112)
Creatinine, Ser: 1.27 mg/dL (ref 0.50–1.35)
GFR calc Af Amer: 67 mL/min — ABNORMAL LOW (ref >90)
Potassium: 4.5 mEq/L (ref 3.5–5.1)

## 2013-01-12 LAB — GLUCOSE, CAPILLARY
Glucose-Capillary: 246 mg/dL — ABNORMAL HIGH (ref 70–99)
Glucose-Capillary: 271 mg/dL — ABNORMAL HIGH (ref 70–99)

## 2013-01-12 MED ORDER — LISINOPRIL 5 MG PO TABS
5.0000 mg | ORAL_TABLET | Freq: Every day | ORAL | Status: DC
  Administered 2013-01-12 – 2013-01-14 (×3): 5 mg via ORAL
  Filled 2013-01-12 (×3): qty 1

## 2013-01-12 MED ORDER — ASPIRIN 81 MG PO CHEW
81.0000 mg | CHEWABLE_TABLET | Freq: Once | ORAL | Status: AC
  Administered 2013-01-12: 81 mg via ORAL
  Filled 2013-01-12: qty 1

## 2013-01-12 NOTE — Progress Notes (Signed)
Subjective: Patient feels better. He is diuresing well his echo is showing an EF of 20-25%. Cardiology consult is pending.  Objective: Vital signs in last 24 hours: Temp:  [97.4 F (36.3 C)-98.4 F (36.9 C)] 98.2 F (36.8 C) (10/07 0730) Pulse Rate:  [99-117] 101 (10/07 0700) Resp:  [9-30] 13 (10/07 0700) BP: (107-132)/(61-91) 125/75 mmHg (10/07 0700) SpO2:  [86 %-98 %] 96 % (10/07 0700) Weight:  [128.5 kg (283 lb 4.7 oz)] 128.5 kg (283 lb 4.7 oz) (10/07 0500) Weight change: -0.5 kg (-1 lb 1.6 oz) Last BM Date: 01/07/13  Intake/Output from previous day: 10/06 0701 - 10/07 0700 In: 1080 [P.O.:1080] Out: 3450 [Urine:3450]  PHYSICAL EXAM General appearance: alert and mild distress Resp: diminished breath sounds bilaterally and rhonchi bilaterally Cardio: S1, S2 normal GI: soft, non-tender; bowel sounds normal; no masses,  no organomegaly Extremities: extremities normal, atraumatic, no cyanosis or edema and tenderness of the lt shoulder  Lab Results:    @labtest @ [facility] No results found for this basename: PHART, PCO2, PO2ART, TCO2, HCO3,  in the last 72 hours CULTURES Recent Results (from the past 240 hour(s))  MRSA PCR SCREENING     Status: None   Collection Time    01/08/13  7:16 PM      Result Value Range Status   MRSA by PCR NEGATIVE  NEGATIVE Final   Comment:            The GeneXpert MRSA Assay (FDA     approved for NASAL specimens     only), is one component of a     comprehensive MRSA colonization     surveillance program. It is not     intended to diagnose MRSA     infection nor to guide or     monitor treatment for     MRSA infections.   Studies/Results: No results found.  Medications: I have reviewed the patient's current medications.  Assesment: Active Problems:   Heart disease   Obesity   Diabetes mellitus   Hypertension   CHF (congestive heart failure)   Cerebrovascular disease    Plan: Medications reviewed Continue IV diuretics Will do  BMP Cardiology consult is pending   LOS: 4 days   Phoenix Dresser 01/12/2013, 7:58 AM

## 2013-01-12 NOTE — Progress Notes (Signed)
Inpatient Diabetes Program Recommendations  AACE/ADA: New Consensus Statement on Inpatient Glycemic Control (2013)  Target Ranges:  Prepandial:   less than 140 mg/dL      Peak postprandial:   less than 180 mg/dL (1-2 hours)      Critically ill patients:  140 - 180 mg/dL   Results for YUSIF, GNAU (MRN [id number]) as of 01/12/2013 12:49  Ref. Range 01/11/2013 07:58 01/11/2013 11:15 01/11/2013 15:50 01/11/2013 20:46 01/12/2013 06:06 01/12/2013 07:29 01/12/2013 11:40  Glucose-Capillary Latest Range: 70-99 mg/dL 440 (H) 102 (H) 725 (H) 202 (H) 231 (H) 246 (H) 271 (H)    Inpatient Diabetes Program Recommendations Insulin - Basal: Please consider ordering NPH 20 units BID.  Note: Fasting blood glucose this morning was 231 mg/dl and was 366 mg/dl at 44:03.  Patient's appetite has improved some and patient has been eating at least 50% of meals.  Please consider restarting NPH but at a much lower dose than taken as an outpatient.  Please consider ordering NPH 20 units BID.  Will continue to follow.  Thanks, Orlando Penner, RN, MSN, CCRN Diabetes Coordinator Inpatient Diabetes Program [phone] (Team Pager) [phone] ([place] office) [phone] [facility] office)

## 2013-01-12 NOTE — Consult Note (Signed)
Primary cardiologist: Consulting cardiologist:  Clinical Summary Timothy Trujillo is a 62 y.o.male hx of DM, HL, PAD, HTN, CAD with prior MI, prior CABG and PCI (do not have further information), and COPD admitted with a 1 week history of dyspnea, orthopnea, PND along with intermittent chest pain on 01/08/13. Initially admitted to stepdown and managed with Bipap, nitro gtt, and IV diuretics. He has diuresed net negative 12 liters during this admission with improvement in his symptoms, stable renal function. Trop neg x 4, TSH 2.08. Echo 01/11/13 showed LVEF 15-20%, prior echo at [facility] 12/2011 showed LVEF 25-30%. LVEF May 2012 showed LVEF 55-60%.   From cardiac standpoint there are limited records in our system. Patient reports he had a CABG done approx 20 years ago at [facility], his anatomy is unknown. He reports he has had multiple stents since that time, the last he believes around 6 years ago. He states his last cath was 3 years ago and he was told that things looked ok. He is followed by a Dr Hyacinth Meeker in [place] from a cardiology standpoint who he sees approx once a year, last saw him approx 1 year ago her his report. He admits to eating fast food often, other dietary indiscretions. He reports that he is compliant with his home medications. From his medication list it appears from a systolic HF standpoint he was only on lisinopril and furosemide.   Allergies  Allergen Reactions  . Stadol [Butorphanol Tartrate] Other (See Comments)    "makes him crazy"    Medications Scheduled Medications: . aspirin  325 mg Oral Daily  . furosemide  80 mg Intravenous Q12H  . heparin  5,000 Units Subcutaneous Q8H  . insulin aspart  0-20 Units Subcutaneous TID WC  . insulin aspart  0-5 Units Subcutaneous QHS  . insulin aspart  6 Units Subcutaneous TID WC  . Living Better with Heart Failure Book   Does not apply Once  . potassium chloride SA  20 mEq Oral Daily  . sodium chloride  3 mL Intravenous Q12H      Infusions:     PRN Medications:  albuterol, docusate sodium, HYDROcodone-acetaminophen, methocarbamol, nitroGLYCERIN, ondansetron (ZOFRAN) IV, ondansetron, senna   Past Medical History  Diagnosis Date  . Diabetes mellitus   . Cerebral artery occlusion   . Heart disease   . Obesity   . Hyperlipidemia   . Peripheral vascular disease   . Hypertension   . Shortness of breath   . Angina   . Asthma   . Myocardial infarction   . Coronary artery disease   . Stroke   . Headache(784.0)   . Arthritis   . Anxiety   . COPD (chronic obstructive pulmonary disease)   . Pneumonia   . Depression     Past Surgical History  Procedure Laterality Date  . Coronary artery bypass graft    . Coronary stent placement  2008    CABG grafts closed  . Joint replacement    . Cholecystectomy    . Eye surgery    . I&d extremity  06/12/2011    Procedure: IRRIGATION AND DEBRIDEMENT EXTREMITY;  Surgeon: Javier Docker, MD;  Location: [facility] OR;  Service: Orthopedics;  Laterality: Right;  I&D Right Tibia    Family History  Problem Relation Age of Onset  . Dementia Mother   . Arthritis Mother   . Heart disease Father   . Heart disease Brother     Social History Mr. Eshbach reports that he  quit smoking about 29 years ago. He has never used smokeless tobacco. Mr. Vantol reports that he does not drink alcohol.  Review of Systems CONSTITUTIONAL: No weight loss, fever, chills, weakness or fatigue.  HEENT: Eyes: No visual loss, blurred vision, double vision or yellow sclerae. No hearing loss, sneezing, congestion, runny nose or sore throat.  SKIN: No rash or itching.  CARDIOVASCULAR: Per HPI.  RESPIRATORY: Per HPI GASTROINTESTINAL: No anorexia, nausea, vomiting or diarrhea. No abdominal pain or blood.  GENITOURINARY: no polyuria, no dysuria NEUROLOGICAL: No headache, dizziness, syncope, paralysis, ataxia, numbness or tingling in the extremities. No change in bowel or bladder control.   MUSCULOSKELETAL: No muscle, back pain, joint pain or stiffness.  HEMATOLOGIC: No anemia, bleeding or bruising.  LYMPHATICS: No enlarged nodes. No history of splenectomy.  PSYCHIATRIC: No history of depression or anxiety.      Physical Examination Blood pressure 125/85, pulse 103, temperature 98.2 F (36.8 C), temperature source Oral, resp. rate 15, height 6\' 3"  (1.905 m), weight 283 lb 4.7 oz (128.5 kg), SpO2 96.00%.  Intake/Output Summary (Last 24 hours) at 01/12/13 1152 Last data filed at 01/12/13 1000  Gross per 24 hour  Intake   1320 ml  Output   3150 ml  Net  -1830 ml    HEENT: no scleral icterus, pupils equal round and  Cardiovascular: RRR, no m/r/g, JVD just below angle of jaw with positive hepatojugular reflux, no carotid brutis  Respiratory: faint crackles in bilateral bases  GI: abdomen soft, NT, ND  MSK: extremities are warm, no edema  Neuro: no focal deficits  Psych: appropriate affect   Lab Results  Basic Metabolic Panel:  Recent Labs Lab 01/08/13 1527 01/09/13 0527 01/11/13 0510 01/12/13 0443  NA 134* 134* 135 136  K 4.4 3.9 4.4 4.5  CL 94* 93* 94* 94*  CO2 28 29 30 30   GLUCOSE 296* 151* 205* 226*  BUN 20 19 26* 32*  CREATININE 1.14 1.15 1.30 1.27  CALCIUM 10.2 10.2 10.1 10.3    Liver Function Tests:  Recent Labs Lab 01/09/13 0527  AST 11  ALT 8  ALKPHOS 83  BILITOT 0.5  PROT 7.0  ALBUMIN 3.5    CBC:  Recent Labs Lab 01/08/13 1527 01/09/13 0527  WBC 8.1 10.7*  NEUTROABS 6.5  --   HGB 13.3 13.6  HCT 39.7 40.6  MCV 90.6 90.0  PLT 261 264    Cardiac Enzymes:  Recent Labs Lab 01/08/13 1527 01/08/13 1817 01/08/13 2326 01/09/13 0527  TROPONINI <0.30 <0.30 <0.30 <0.30    BNP: No components found with this basename: POCBNP,    ECG 01/08/13 EKG: sinus tach, incomp LBBB  Imaging 01/08/13 CXR FINDINGS:  Lungs are somewhat hypoinflated with continued hazy perihilar  opacification likely interstitial edema  although cannot exclude  infection. There has been mild interval improvement in this airspace  process with slightly better aeration. Subtle blunting of the right  costophrenic angle likely a small amount of right pleural fluid.  Mild stable cardiomegaly. Remainder of the exam is unchanged.  IMPRESSION:  Interval improvement in mild hazy perihilar opacification likely  improving interstitial edema, although cannot exclude infection.  Possible small amount of right pleural fluid.     Assessment/Plan 1. Acute on chronic systolic heart failure - based on history presume ischemic CM, LVEF 15-20% this admission, trending downward from previous echoes.  - his cardiac history is still somewhat unclear, he is followed by a cardiologist in [place]. I do not know his coronary  anatomy - likely reason for exacerbation is dietary non-compliance. Patient has responded well to IV diuresis, still with some evidence of volume overload. Continue current IV diuresis - start back low dose ACE-I, once he is more euvolemic recommend starting low dose beta blocker in the setting of systolic dysfunction. - his systolic function seems to have progressively worsened, this certaintly could be due to recurrence of CAD. He warrants a repeat cath at some point in my opinion, but this is not neccesary during this admit and I will defer to his primary cardiologist. - he needs frequent follow up for medication titration and consideration for possible ICD once medically optimized, he may continue in [place] or if he wishes to transfer his care we are happy to see him.   2. CAD - change ASA to 81mg  daily, patient should be on high dose statin for secondary prevention unless there is a contraindication. Recommend starting this unless there is a contraindication        Dina Rich, M.D., F.A.C.C.

## 2013-01-12 NOTE — Care Management Note (Addendum)
    Page 1 of 2   01/18/2013     10:34:18 AM   CARE MANAGEMENT NOTE 01/18/2013  Patient:  Timothy Trujillo, Timothy Trujillo   Account Number:  [id number]  Date Initiated:  01/12/2013  Documentation initiated by:  Timothy Trujillo  Subjective/Objective Assessment:   Pt admitted from home with CHF. Pt lives alone and has a grandson who checks on him daily. Pt has 2 sons who live close by but according to pt "they have their own lives to live". Pt has w/c, walker, cane, and home O2 with [employer].     Action/Plan:   Pt is interested in options of ALF. CSW aware of referral and will talk with pt. If pt returns home will definitely benefit from [facility] at discharge. Pt stated that he is afraid his grandson is going to come in and find him dead at home alone.   Anticipated DC Date:  01/15/2013   Anticipated DC Plan:  HOME W HOME HEALTH SERVICES  In-house referral  Clinical Social Worker      DC Planning Services  CM consult      Choice offered to / List presented to:             Status of service:  In process, will continue to follow Medicare Important Message given?   (If response is "NO", the following Medicare IM given date fields will be blank) Date Medicare IM given:   Date Additional Medicare IM given:    Discharge Disposition:  HOME W HOME HEALTH SERVICES  Per UR Regulation:    If discussed at Long Length of Stay Meetings, dates discussed:    Comments:  01/18/13  Oletta Cohn, RN, BSN, NCM Spoke with pt at bedside regarding discharge planning for [facility]. Offered pt list of home health agencies to choose from.  Pt chose [facility] to render services. Judeth Cornfield of [facility] notified.  No DME needs identified at this time.   01/13/13 1505 Arlyss Queen, RN BSN CM Pt transferring to [facility].  01/12/13 1435 Arlyss Queen, RN BSN CM

## 2013-01-13 ENCOUNTER — Other Ambulatory Visit: Payer: Self-pay

## 2013-01-13 ENCOUNTER — Encounter (HOSPITAL_COMMUNITY): Payer: Self-pay | Admitting: General Practice

## 2013-01-13 LAB — BASIC METABOLIC PANEL
CO2: 27 mEq/L (ref 19–32)
Calcium: 10.1 mg/dL (ref 8.4–10.5)
Creatinine, Ser: 1.34 mg/dL (ref 0.50–1.35)
Glucose, Bld: 193 mg/dL — ABNORMAL HIGH (ref 70–99)

## 2013-01-13 LAB — CBC
HCT: 44 % (ref 39.0–52.0)
Hemoglobin: 15.1 g/dL (ref 13.0–17.0)
MCH: 30.5 pg (ref 26.0–34.0)
MCV: 88.9 fL (ref 78.0–100.0)
RBC: 4.95 MIL/uL (ref 4.22–5.81)

## 2013-01-13 LAB — GLUCOSE, CAPILLARY
Glucose-Capillary: 218 mg/dL — ABNORMAL HIGH (ref 70–99)
Glucose-Capillary: 190 mg/dL — ABNORMAL HIGH (ref 70–99)
Glucose-Capillary: 206 mg/dL — ABNORMAL HIGH (ref 70–99)

## 2013-01-13 MED ORDER — ASPIRIN 81 MG PO CHEW: 81.0000 mg | CHEWABLE_TABLET | Freq: Every day | ORAL | Status: DC

## 2013-01-13 MED ORDER — SODIUM CHLORIDE 0.9 % IV BOLUS (SEPSIS)
200.0000 mL | Freq: Once | INTRAVENOUS | Status: AC
  Administered 2013-01-13: 21:00:00 200 mL via INTRAVENOUS

## 2013-01-13 MED ORDER — HEPARIN (PORCINE) IN NACL 100-0.45 UNIT/ML-% IJ SOLN
1900.0000 [IU]/h | INTRAMUSCULAR | Status: DC
  Administered 2013-01-13 – 2013-01-14 (×2): 1600 [IU]/h via INTRAVENOUS
  Filled 2013-01-13 (×6): qty 250

## 2013-01-13 MED ORDER — ASPIRIN 81 MG PO CHEW
81.0000 mg | CHEWABLE_TABLET | Freq: Every day | ORAL | Status: DC
  Administered 2013-01-13 – 2013-01-18 (×6): 81 mg via ORAL
  Filled 2013-01-13 (×6): qty 1

## 2013-01-13 NOTE — Progress Notes (Signed)
Inpatient Diabetes Program Recommendations  AACE/ADA: New Consensus Statement on Inpatient Glycemic Control (2013)  Target Ranges:  Prepandial:   less than 140 mg/dL      Peak postprandial:   less than 180 mg/dL (1-2 hours)      Critically ill patients:  140 - 180 mg/dL   Results for DELYNN, PURSLEY (MRN [id number]) as of 01/13/2013 12:05  Ref. Range 01/12/2013 06:06 01/12/2013 07:29 01/12/2013 11:40 01/12/2013 16:49 01/12/2013 21:31 01/13/2013 07:34 01/13/2013 11:20  Glucose-Capillary Latest Range: 70-99 mg/dL 086 (H) 578 (H) 469 (H) 174 (H) 199 (H) 207 (H) 218 (H)   Inpatient Diabetes Program Recommendations Insulin - Basal: Please consider ordering NPH 10-20 units BID. Insulin - Meal Coverage: Please consider increasing meal coverage to Novolog 9 units TID with meals.  Note: Blood glucose over the past 30 hours has ranged from 174-271 mg/dl and fasting glucose this morning was 207 mg/dl.  Please consider ordering low dose NPH; recommend starting with 10-20 units BID.  Also, please increase meal coverage to Novolog 9 units TID with meals.  Will continue to follow.  Thanks, Orlando Penner, RN, MSN, CCRN Diabetes Coordinator Inpatient Diabetes Program [phone] (Team Pager) [phone] ([facility] office) [phone] [facility] office)

## 2013-01-13 NOTE — Progress Notes (Signed)
CTSP for episode of CP. He described it as substernal chest tightness similar to the angina he's been having daily. 1 SL NTG completely relieved the pain prior to my arrival. Post-NTG, BP is 82/48 confirmed manually. EKG does not show any acute changes. The patient says he typically feels bad after receiving NTG which I suspect is related to the drop in BP. Denies further CP, dizziness, lightheadness, or other current acute sx. He is A+Ox3. He states this is the lowest weight he has been in a long time. BUN is slightly up today on labs and Hgb has increased relative to admission, so I wonder if he's becoming too dry. Pt is laying flat in bed without distress. Will give gentle 200cc bolus and follow BP closely for now. Per notes there is plan for possible cath tomorrow. Do not see any orders yet. Will hold further IV Lasix/KCl in prep for this and make NPO after midnight. He can be reassessed in AM for readiness for cath. Discussed anticoag with Dr. Diona Browner - given continued stuttering CP, will start heparin per pharmacy as there is currently no contraindication to such.  Dayna Dunn PA-C

## 2013-01-13 NOTE — Progress Notes (Signed)
Patient ready for transfer to [facility] for Cardiac Cath.  Patient alert and oriented with no acute distress noted.  Report called to [place] on 4E. Patient in route to [facility] by [facility].

## 2013-01-13 NOTE — Clinical Social Work Psychosocial (Signed)
Clinical Social Work Department BRIEF PSYCHOSOCIAL ASSESSMENT 01/13/2013  Patient:  Timothy Trujillo, Timothy Trujillo     Account Number:  [id number]     Admit date:  01/08/2013  Clinical Social Worker:  Nancie Neas  Date/Time:  01/13/2013 12:00 N  Referred by:  Care Management  Date Referred:  01/13/2013 Referred for  ALF Placement   Other Referral:   Interview type:  Patient Other interview type:    PSYCHOSOCIAL DATA Living Status:  ALONE Admitted from facility:   Level of care:   Primary support name:  David/Scott Primary support relationship to patient:  CHILD, ADULT Degree of support available:   adequate    CURRENT CONCERNS Current Concerns  Post-Acute Placement   Other Concerns:    SOCIAL WORK ASSESSMENT / PLAN CSW met with pt at bedside after referral from CM for ALF information. Pt alert and oriented and is awaiting transfer to [facility] for cardiac cath. He reports he lives alone. Pt has two sons who he said are busy with their own lives. His grandson is his best support. He checks on pt in the morning and evening and spends weekends with him. Pt reports he is fairly independent with ADLs at baseline, but it is becoming more difficult for him to manage everything on his own. Pt has equipment at home through [facility].  CSW discussed ALF placement process. He is aware it is private pay. Pt receives $1600 a month social security. CSW provided ALF lists for [place] and [place]. Pt reports this is something he would like to think about more and plans to return home and work on placement from home when ready.   Assessment/plan status:  Referral to [facility] Other assessment/ plan:   Information/referral to community resources:   ALF list    PATIENT'S/FAMILY'S RESPONSE TO PLAN OF CARE: Pt appreciative of information on ALF. He plans to transfer to [facility] for cardiac cath and then return home. Pt aware that his PCP will need to complete paperwork prior to entering ALF. CSW  will sign off, but can be reconsulted if needed.       Derenda Fennel, [place] [phone]

## 2013-01-13 NOTE — Progress Notes (Signed)
ANTICOAGULATION CONSULT NOTE - Initial Consult  Pharmacy Consult for heparin Indication: chest pain/ACS  Allergies  Allergen Reactions  . Stadol [Butorphanol Tartrate] Other (See Comments)    "makes him crazy"    Patient Measurements: Height: 6\' 3"  (190.5 cm) Weight: 278 lb 10.6 oz (126.4 kg) (scale B) IBW/kg (Calculated) : 84.5 Heparin Dosing Weight: 112 kg  Vital Signs: Temp: 97.9 F (36.6 C) (10/08 1558) Temp src: Oral (10/08 1558) BP: 80/50 mmHg (10/08 1828) Pulse Rate: 99 (10/08 1820)  Labs:  Recent Labs  01/11/13 0510 01/12/13 0443 01/13/13 0554  HGB  --   --  15.1  HCT  --   --  44.0  PLT  --   --  262  CREATININE 1.30 1.27 1.34    Estimated Creatinine Clearance: 79.8 ml/min (by C-G formula based on Cr of 1.34).   Medical History: Past Medical History  Diagnosis Date  . Diabetes mellitus   . Cerebral artery occlusion   . Heart disease   . Obesity   . Hyperlipidemia   . Peripheral vascular disease   . Hypertension   . Shortness of breath   . Angina   . Asthma   . Myocardial infarction   . Coronary artery disease   . Stroke   . Headache(784.0)   . Arthritis   . Anxiety   . COPD (chronic obstructive pulmonary disease)   . Pneumonia   . Depression     Medications:  Scheduled:  . aspirin  81 mg Oral Daily  . insulin aspart  0-20 Units Subcutaneous TID WC  . insulin aspart  0-5 Units Subcutaneous QHS  . insulin aspart  6 Units Subcutaneous TID WC  . lisinopril  5 mg Oral Daily  . Living Better with Heart Failure Book   Does not apply Once  . sodium chloride  200 mL Intravenous Once  . sodium chloride  3 mL Intravenous Q12H   Infusions:    Assessment: 62 yo male with chest pain will be started on heparin therapy.  Patient was on heparin SQ previously; last dose was at 1448 today. Goal of Therapy:  Heparin level 0.3-0.7 units/ml Monitor platelets by anticoagulation protocol: Yes   Plan:  1) Start heparin at 1600 units/hr. No bolus 2)  Check a 6hr heparin level  3) Daily heparin level and CBC  Sulema Braid, Tsz-Yin 01/13/2013,7:41 PM

## 2013-01-13 NOTE — Progress Notes (Addendum)
Subjective:  Patient had episode of chest tightness into his neck and arm this am relieved with 1 NTG. He says he's had daily chest pain for the past month taking 1-2 NTG daily. Pain at rest or with exertion. Insurance will not pay for his cardiologist in [place]. He'd like to transfer to [place].  Objective:  Vital Signs in the last 24 hours: Temp:  [98.1 F (36.7 C)-98.3 F (36.8 C)] 98.3 F (36.8 C) (10/08 0000) Pulse Rate:  [92-106] 92 (10/08 0600) Resp:  [11-17] 16 (10/08 0600) BP: (91-126)/(55-88) 110/75 mmHg (10/08 0600) SpO2:  [94 %-100 %] 97 % (10/08 0600) Weight:  [279 lb 5.2 oz (126.7 kg)] 279 lb 5.2 oz (126.7 kg) (10/08 0500)  Intake/Output from previous day: 10/07 0701 - 10/08 0700 In: 720 [P.O.:720] Out: 3650 [Urine:3650] Intake/Output from this shift:    Physical Exam: NECK: Without JVD, HJR, or bruit LUNGS: Decreased breath sounds throughout with a few crackles at the bases  HEART: Regular rate and rhythm, no murmur, gallop, rub, bruit, thrill, or heave EXTREMITIES: Without cyanosis, clubbing, or edema   Lab Results:  Recent Labs  01/13/13 0554  WBC 7.1  HGB 15.1  PLT 262    Recent Labs  01/12/13 0443 01/13/13 0554  NA 136 134*  K 4.5 4.0  CL 94* 93*  CO2 30 27  GLUCOSE 226* 193*  BUN 32* 35*  CREATININE 1.27 1.34   No results found for this basename: TROPONINI, CK, MB,  in the last 72 hours Hepatic Function Panel No results found for this basename: PROT, ALBUMIN, AST, ALT, ALKPHOS, BILITOT, BILIDIR, IBILI,  in the last 72 hours No results found for this basename: CHOL,  in the last 72 hours No results found for this basename: PROTIME,  in the last 72 hours  Imaging: 2Decho 01/11/13: Study Conclusions  - Study data: Technically difficult study. - Left ventricle: The cavity size was mildly dilated. Wall   thickness was normal. The estimated ejection fraction was   in the range of 15% to 20%. This is significantly worst   compared to prior  study May 16,2012. The study is not   technically sufficient to allow evaluation of LV diastolic   function. Internal dimension: 63mm (ED, PLAX). - Aortic valve: Mildly calcified annulus. Mildly thickened   leaflets. Uncertain number of leaflets. Valve area:   2.87cm^2(VTI). Valve area: 3.1cm^2 (Vmax). - Left atrium: The atrium was severely dilated. - Right ventricle: The cavity size was mildly to moderately   dilated. Systolic function was moderately reduced. RV   TAPSE is 1.2 cm. - Right atrium: The atrium was moderately dilated. - Pulmonary arteries: Systolic pressure was moderately   increased. PA peak pressure: 60mm Hg (S). Transthoracic echocardiography.  M-mode, complete 2D, spectral Doppler, and color Doppler.  Height:  Height: 190.5cm. Height: 75in.  Weight:  Weight: 128.8kg. Weight: 283.4lb.  Body mass index:  BMI: 35.5kg/m^2.  Body surface area:    BSA: 2.22m^2.  Blood pressure:     129/78.  Patient status:  Inpatient.  Location:  ICU/CCU  ------------------------------------------------------------  ------------------------------------------------------------ Left ventricle:  The cavity size was mildly dilated. Wall thickness was normal. The estimated ejection fraction was in the range of 15% to 20%. This is significantly worst compared to prior study May 16.2012. Images were inadequate for LV wall motion assessment. The study is not technically sufficient to allow evaluation of LV diastolic function.    Cardiac Studies:  Assessment/Plan:  1. Acute on chronic systolic heart  failure: Having daily chest pain. Probably a combination of ischemia adn dietary noncompliance. - based on history presume ischemic CM, LVEF 15-20% this admission, trending downward from previous echoes.    Patient has responded well to IV diuresis, still with some evidence of volume overload. Continue current IV diuresis - start back low dose ACE-I, once he is more euvolemic recommend starting  low dose beta blocker in the setting of systolic dysfunction. - he needs frequent follow up for medication titration and consideration for possible ICD once medically optimized 2. CAD: Chest pain this am relieved with NTG. Having daily chest pain for past month. Recommend cardiac cath. Patient would like [place] to take over his cardiac care as his insurance won't cover his [facility] cardiologist. Have requested records. - change ASA to 81mg  daily, patient should be on high dose statin for secondary prevention unless there is a contraindication. Recommend starting this unless there is a contraindication.        LOS: 5 days    Jacolyn Reedy 01/13/2013, 8:07 AM  Attending Note Patient seen and discussed with NP Lenze. Patient admitted with ADSHF, echoes over the last few years show progressively worsening LVEF. In the setting of known CAD as well as daily chest pain relieved with NG, high concern for progression of his underlying CAD. This is also his first admission for decompensated heart failure in several years per his report, certainly the reason for this decompensation could be chronic ischemia. He has diuresed well this admission and is beginning to feel better. He reports he would like to transition his care to [facility]. In this setting of progressing ICM with intermittent daily chest pain recommend pursuing LHC/RHC, we will arrange transfer to [facility] for this to be done. Once he is euvolemic he will need to be initiated on a beta blocker, and have frequent follow up for titration of medications. Once coronary status known and optimized on meds, needs consideration for ICD therapy if LVEF not improved.   Dina Rich MD

## 2013-01-13 NOTE — Progress Notes (Signed)
Subjective: Patient is gradually improving. He is diuresing well. He is evaluated by cardiology. Vital signs in last 24 hours: Temp:  [98.1 F (36.7 C)-98.3 F (36.8 C)] 98.3 F (36.8 C) (10/08 0000) Pulse Rate:  [92-106] 92 (10/08 0600) Resp:  [11-17] 16 (10/08 0600) BP: (91-126)/(55-88) 110/75 mmHg (10/08 0600) SpO2:  [94 %-100 %] 97 % (10/08 0600) Weight:  [126.7 kg (279 lb 5.2 oz)] 126.7 kg (279 lb 5.2 oz) (10/08 0500) Weight change: -1.8 kg (-3 lb 15.5 oz) Last BM Date: 01/07/13  Intake/Output from previous day: 10/07 0701 - 10/08 0700 In: 720 [P.O.:720] Out: 3650 [Urine:3650]  PHYSICAL EXAM General appearance: alert and mild distress Resp: diminished breath sounds bilaterally and rhonchi bilaterally Cardio: S1, S2 normal GI: soft, non-tender; bowel sounds normal; no masses,  no organomegaly Extremities: extremities normal, atraumatic, no cyanosis or edema and tenderness of the lt shoulder  Lab Results:    @labtest @ ABGS No results found for this basename: PHART, PCO2, PO2ART, TCO2, HCO3,  in the last 72 hours CULTURES Recent Results (from the past 240 hour(s))  MRSA PCR SCREENING     Status: None   Collection Time    01/08/13  7:16 PM      Result Value Range Status   MRSA by PCR NEGATIVE  NEGATIVE Final   Comment:            The GeneXpert MRSA Assay (FDA     approved for NASAL specimens     only), is one component of a     comprehensive MRSA colonization     surveillance program. It is not     intended to diagnose MRSA     infection nor to guide or     monitor treatment for     MRSA infections.   Studies/Results: No results found.  Medications: I have reviewed the patient's current medications.  Assesment: Active Problems:   Heart disease   Obesity   Diabetes mellitus   Hypertension   CHF (congestive heart failure)   Cerebrovascular disease    Plan: Medications reviewed Continue IV diuretics Will do BMP Cardiology consult is  appreciated.   LOS: 5 days   Tesla Keeler 01/13/2013, 8:02 AM

## 2013-01-14 ENCOUNTER — Encounter (HOSPITAL_COMMUNITY)
Admission: EM | Disposition: A | Discharge status: Home Health | Payer: Self-pay | Source: Home / Self Care | Attending: Pulmonary Disease

## 2013-01-14 ENCOUNTER — Ambulatory Visit (HOSPITAL_COMMUNITY): Admission: RE | Admit: 2013-01-14 | Payer: Medicare Other | Source: Ambulatory Visit | Admitting: Cardiovascular Disease

## 2013-01-14 DIAGNOSIS — N179 Acute kidney failure, unspecified: Secondary | ICD-10-CM

## 2013-01-14 LAB — BASIC METABOLIC PANEL
BUN: 51 mg/dL — ABNORMAL HIGH (ref 6–23)
CO2: 25 mEq/L (ref 19–32)
CO2: 28 mEq/L (ref 19–32)
Calcium: 9.7 mg/dL (ref 8.4–10.5)
Calcium: 9.6 mg/dL (ref 8.4–10.5)
Creatinine, Ser: 2.28 mg/dL — ABNORMAL HIGH (ref 0.50–1.35)
Creatinine, Ser: 2.09 mg/dL — ABNORMAL HIGH (ref 0.50–1.35)
GFR calc Af Amer: 37 mL/min — ABNORMAL LOW (ref >90)
GFR calc non Af Amer: 29 mL/min — ABNORMAL LOW (ref >90)
Glucose, Bld: 208 mg/dL — ABNORMAL HIGH (ref 70–99)

## 2013-01-14 LAB — CBC
HCT: 41 % (ref 39.0–52.0)
Hemoglobin: 14.5 g/dL (ref 13.0–17.0)
MCH: 31.1 pg (ref 26.0–34.0)
MCHC: 35.4 g/dL (ref 30.0–36.0)
MCV: 88 fL (ref 78.0–100.0)
Platelets: 243 10*3/uL (ref 150–400)
RBC: 4.66 MIL/uL (ref 4.22–5.81)
RDW: 14.8 % (ref 11.5–15.5)
WBC: 8.1 10*3/uL (ref 4.0–10.5)

## 2013-01-14 LAB — GLUCOSE, CAPILLARY
Glucose-Capillary: 184 mg/dL — ABNORMAL HIGH (ref 70–99)
Glucose-Capillary: 203 mg/dL — ABNORMAL HIGH (ref 70–99)

## 2013-01-14 LAB — PROTIME-INR
INR: 0.97 (ref 0.00–1.49)
Prothrombin Time: 12.7 seconds (ref 11.6–15.2)

## 2013-01-14 LAB — HEPARIN LEVEL (UNFRACTIONATED): Heparin Unfractionated: 0.21 IU/mL — ABNORMAL LOW (ref 0.30–0.70)

## 2013-01-14 SURGERY — LEFT AND RIGHT HEART CATHETERIZATION WITH CORONARY ANGIOGRAM
Anesthesia: LOCAL

## 2013-01-14 MED ORDER — INSULIN ASPART 100 UNIT/ML ~~LOC~~ SOLN
9.0000 [IU] | Freq: Three times a day (TID) | SUBCUTANEOUS | Status: DC
  Administered 2013-01-14 – 2013-01-18 (×10): 9 [IU] via SUBCUTANEOUS

## 2013-01-14 MED ORDER — SODIUM CHLORIDE 0.9 % IV SOLN
INTRAVENOUS | Status: DC
  Administered 2013-01-14: 07:00:00 via INTRAVENOUS

## 2013-01-14 MED ORDER — SODIUM CHLORIDE 0.9 % IJ SOLN: 3.0000 mL | INTRAMUSCULAR | Status: DC | PRN

## 2013-01-14 MED ORDER — SODIUM CHLORIDE 0.9 % IV SOLN: INTRAVENOUS | Status: DC

## 2013-01-14 MED ORDER — SODIUM CHLORIDE 0.9 % IJ SOLN: 3.0000 mL | Freq: Two times a day (BID) | INTRAMUSCULAR | Status: DC

## 2013-01-14 MED ORDER — ZOLPIDEM TARTRATE 5 MG PO TABS
10.0000 mg | ORAL_TABLET | Freq: Every evening | ORAL | Status: DC | PRN
  Administered 2013-01-14 – 2013-01-17 (×2): 10 mg via ORAL
  Filled 2013-01-14 (×2): qty 2

## 2013-01-14 MED ORDER — SODIUM CHLORIDE 0.9 % IV SOLN
INTRAVENOUS | Status: DC
  Administered 2013-01-14: 14:00:00 via INTRAVENOUS

## 2013-01-14 MED ORDER — ASPIRIN 81 MG PO CHEW
81.0000 mg | CHEWABLE_TABLET | ORAL | Status: AC
  Administered 2013-01-14: 07:00:00 81 mg via ORAL
  Filled 2013-01-14: qty 1

## 2013-01-14 MED ORDER — ZOLPIDEM TARTRATE 5 MG PO TABS: 5.0000 mg | ORAL_TABLET | Freq: Every evening | ORAL | Status: DC | PRN

## 2013-01-14 MED ORDER — ATORVASTATIN CALCIUM 40 MG PO TABS
40.0000 mg | ORAL_TABLET | Freq: Every day | ORAL | Status: DC
  Administered 2013-01-14 – 2013-01-17 (×4): 40 mg via ORAL
  Filled 2013-01-14 (×6): qty 1

## 2013-01-14 MED ORDER — SODIUM CHLORIDE 0.9 % IV SOLN: 250.0000 mL | INTRAVENOUS | Status: DC | PRN

## 2013-01-14 NOTE — Progress Notes (Signed)
ANTICOAGULATION CONSULT NOTE - Follow up Consult  Pharmacy Consult for heparin Indication: chest pain/ACS  Allergies  Allergen Reactions  . Stadol [Butorphanol Tartrate] Other (See Comments)    "makes him crazy"    Patient Measurements: Height: 6\' 3"  (190.5 cm) Weight: 277 lb 11.2 oz (125.964 kg) (pt asked to be weighed in the bed) IBW/kg (Calculated) : 84.5 Heparin Dosing Weight: 112 kg  Vital Signs: Temp: 97.5 F (36.4 C) (10/09 2058) Temp src: Oral (10/09 2058) BP: 88/62 mmHg (10/09 2058) Pulse Rate: 97 (10/09 2058)  Labs:  Recent Labs  01/13/13 0554 01/14/13 0935 01/14/13 1117 01/14/13 1930  HGB 15.1 14.5  --   --   HCT 44.0 41.0  --   --   PLT 262 243  --   --   LABPROT  --  12.7  --   --   INR  --  0.97  --   --   HEPARINUNFRC  --  0.21*  --  0.55  CREATININE 1.34 2.28* 2.09*  --     Estimated Creatinine Clearance: 51.1 ml/min (by C-G formula based on Cr of 2.09).    Assessment: The 6hr heparin level is 0.55 on 1900 units/hr IV heparin infusion in this 62 yo male on IV heparin for chest pain.  Level is therapeutic. No bleeding reported.   Goal of Therapy:  Heparin level 0.3-0.7 units/ml Monitor platelets by anticoagulation protocol: Yes   Plan:  Continue heparin rate at 1900 units/hr. Daily heparin level and CBC  Noah Delaine, RPh Clinical Pharmacist Pager: [phone] 01/14/2013,10:35 PM

## 2013-01-14 NOTE — Progress Notes (Signed)
ANTICOAGULATION CONSULT NOTE - Follow up Consult  Pharmacy Consult for heparin Indication: chest pain/ACS  Allergies  Allergen Reactions  . Stadol [Butorphanol Tartrate] Other (See Comments)    "makes him crazy"    Patient Measurements: Height: 6\' 3"  (190.5 cm) Weight: 277 lb 11.2 oz (125.964 kg) (pt asked to be weighed in the bed) IBW/kg (Calculated) : 84.5 Heparin Dosing Weight: 112 kg  Vital Signs: Temp: 98 F (36.7 C) (10/09 0930) Temp src: Oral (10/09 0930) BP: 103/65 mmHg (10/09 0930) Pulse Rate: 95 (10/09 0930)  Labs:  Recent Labs  01/12/13 0443 01/13/13 0554 01/14/13 0935  HGB  --  15.1 14.5  HCT  --  44.0 41.0  PLT  --  262 243  LABPROT  --   --  12.7  INR  --   --  0.97  HEPARINUNFRC  --   --  0.21*  CREATININE 1.27 1.34 2.28*    Estimated Creatinine Clearance: 46.8 ml/min (by C-G formula based on Cr of 2.28).    Assessment: 62 yo male with chest pain  started on heparin therapy on 10/8.  First heaprin level is 0.21 which is below goal of 0.3-0.7  Goal of Therapy:  Heparin level 0.3-0.7 units/ml Monitor platelets by anticoagulation protocol: Yes   Plan:  1) Increase heparin to 1900 units/hr. 2) Check a 6hr heparin level  3) Daily heparin level and CBC  Mickeal Skinner 01/14/2013,1:00 PM

## 2013-01-14 NOTE — Progress Notes (Addendum)
Patient ID: Timothy Trujillo, male   DOB: 07/04/1950, 62 y.o.   MRN: [id number] The patient was brought for left and right heart catheterization, however, this mornings BMET revealed a creatinine of 2.28 . He is 14 liters negative since admission  Decided to postpone catheterization until renal function normalizes. IV NS at 50 cc/hr and recheck renal function in AM and consider cath if function improves. It might be best to let the patient go home on medical therapy and do OP cath once renal function stabilizes. I doubt coronary intervention will be needed/indicated. He basically has a terrible LV and needs medical therapy. 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

## 2013-01-14 NOTE — Progress Notes (Signed)
Patient: Timothy Trujillo / Admit Date: 01/08/2013 / Date of Encounter: 01/14/2013, 2:07 PM   Subjective  No further CP since last night. Feels OK. Asking if there's something to help him sleep at night.   Objective   Telemetry: NSR  Physical Exam: Blood pressure 100/72, pulse 102, temperature 98.1 F (36.7 C), temperature source Oral, resp. rate 20, height 6\' 3"  (1.905 m), weight 277 lb 11.2 oz (125.964 kg), SpO2 100.00%. General: Well developed, obese WM in no acute distress. Laying flat in bed without resp distress. Head: Normocephalic, atraumatic, sclera non-icteric, no xanthomas, nares are without discharge. Neck: JVP not elevated. Lungs: Clear bilaterally to auscultation without wheezes, rales, or rhonchi. Breathing is unlabored. Heart: RRR S1 S2 without murmurs, rubs, or gallops.  Abdomen: Soft, non-tender, non-distended with normoactive bowel sounds. No rebound/guarding. Extremities: No clubbing or cyanosis. No edema. Distal pedal pulses are 2+ and equal bilaterally. Neuro: Alert and oriented X 3. Moves all extremities spontaneously. Psych:  Responds to questions appropriately with a normal affect.   Intake/Output Summary (Last 24 hours) at 01/14/13 1407 Last data filed at 01/14/13 1300  Gross per 24 hour  Intake 1001.47 ml  Output    550 ml  Net 451.47 ml    Inpatient Medications:  . aspirin  81 mg Oral Daily  . insulin aspart  0-20 Units Subcutaneous TID WC  . insulin aspart  0-5 Units Subcutaneous QHS  . insulin aspart  6 Units Subcutaneous TID WC  . lisinopril  5 mg Oral Daily  . Living Better with Heart Failure Book   Does not apply Once  . sodium chloride  3 mL Intravenous Q12H   Infusions:  . sodium chloride    . heparin 1,600 Units/hr (01/14/13 1200)    Labs:  Recent Labs  01/14/13 0935 01/14/13 1117  NA 131* 132*  K 4.6 4.7  CL 92* 91*  CO2 25 28  GLUCOSE 192* 208*  BUN 51* 51*  CREATININE 2.28* 2.09*  CALCIUM 9.7 9.6    Recent Labs  01/13/13 0554 01/14/13 0935  WBC 7.1 8.1  HGB 15.1 14.5  HCT 44.0 41.0  MCV 88.9 88.0  PLT 262 243    Radiology/Studies:  Dg Chest Port 1 View  01/09/2013   CLINICAL DATA:  Followup congestive heart failure.  EXAM: PORTABLE CHEST - 1 VIEW  COMPARISON:  01/08/2013  FINDINGS: Lungs are somewhat hypoinflated with continued hazy perihilar opacification likely interstitial edema although cannot exclude infection. There has been mild interval improvement in this airspace process with slightly better aeration. Subtle blunting of the right costophrenic angle likely a small amount of right pleural fluid. Mild stable cardiomegaly. Remainder of the exam is unchanged.  IMPRESSION: Interval improvement in mild hazy perihilar opacification likely improving interstitial edema, although cannot exclude infection. Possible small amount of right pleural fluid.  Stable cardiomegaly.   Electronically Signed   By: Elberta Fortis M.D.   On: 01/09/2013 08:52   Dg Chest Portable 1 View  01/08/2013   CLINICAL DATA:  Shortness of breath  EXAM: PORTABLE CHEST - 1 VIEW  COMPARISON:  08/14/2012  FINDINGS: Cardiac shadow remains enlarged. Postsurgical changes are again noted. Increased vascular congestion and pulmonary edema is noted. No focal infiltrate is seen.  IMPRESSION: Severe congestive failure with pulmonary edema.   Electronically Signed   By: Alcide Clever M.D.   On: 01/08/2013 15:19   Dg Shoulder Left Port  01/09/2013   *RADIOLOGY REPORT*  Clinical Data: Post fall 1  week ago now with persistent anterior shoulder pain, initial encounter.  PORTABLE LEFT SHOULDER - 2+ VIEW  Comparison: None.  Findings: Osteopenia.  No definite fracture or dislocation. Given obliquity, mild degenerative changes of the left glenohumeral joint is suspected with joint space loss, subchondral sclerosis and osteophytosis.  Suspect mild degenerative change of the [facility] joint. Potential minimal amount of calcific tendonitis.  Limited visualization of  adjacent thorax is normal.  Regional soft tissues are normal.  IMPRESSION: 1.  Osteopenia without definite acute finding. 2.  Mild degenerative change of the glenohumeral and acromioclavicular joints with possible mild calcific tendonitis. Further evaluation with shoulder MRI may be performed as clinically indicated.   Original Report Authenticated By: Tacey Ruiz, MD     Assessment and Plan  1. Unstable angina with known history of CAD s/p prior CABG/PCI 2. Acute on chronic CHF with worsening ICM (EF 15-20% by echo 10/6) 3. HTN with episode of hypotension last night 4. Acute renal failure with oliguria, likely due to overdiuresis 5. IDDM, uncontrolled, A1C 12.8  Pt's cath was cancelled this morning due to elevated Cr suspected due to overdiuresis and possible ATN from hypotension. He had episode of hypotension last night following CP/SL NTG administration and was given IVF. Lasix/Kcl were discontinued. Will also DC ACEI for now. Agree with the gentle IV fluids that were entered (50cc/hr for 24hrs). Continue ASA, heparin per pharmacy. Hold off on BB due to hypotension. Add statin and check lipids. Increase meal coverage insulin to 9 units TID. Ambien qhs PRN sleep.  Defer cath pending improvement in renal function. Dr. Katrinka Blazing mentions possibly letting the patient go home and come back for cath, but the patient has essentially been having daily CP relieved with SL NTG. Dr. Excell Seltzer to follow.  Signed, Ronie Spies PA-C  Patient seen, examined. Available data reviewed. Agree with findings, assessment, and plan as outlined by Ronie Spies, PA-C. Exam reveals pleasant, alert male in NAD. LUngs are clear. Heart sounds are distant without murmur. There is no peripheral edema. Lab data and history reviewed. Agree with gentle IV fluids tonight and repeat BMET in am. Will need to assess creatinine tomorrow before making final decision on cath. Pt understands plan. Will keep NPO after midnight and hopefully can do  limited dye study tomorrow.   Tonny Bollman, M.D. 01/14/2013 7:09 PM

## 2013-01-15 ENCOUNTER — Encounter (HOSPITAL_COMMUNITY)
Admission: EM | Disposition: A | Discharge status: Home Health | Payer: Self-pay | Source: Home / Self Care | Attending: Pulmonary Disease

## 2013-01-15 DIAGNOSIS — N189 Chronic kidney disease, unspecified: Secondary | ICD-10-CM

## 2013-01-15 DIAGNOSIS — I959 Hypotension, unspecified: Secondary | ICD-10-CM

## 2013-01-15 DIAGNOSIS — I2510 Atherosclerotic heart disease of native coronary artery without angina pectoris: Secondary | ICD-10-CM

## 2013-01-15 HISTORY — PX: LEFT AND RIGHT HEART CATHETERIZATION WITH CORONARY/GRAFT ANGIOGRAM: SHX5448

## 2013-01-15 LAB — POCT I-STAT 3, VENOUS BLOOD GAS (G3P V)
Acid-base deficit: 1 mmol/L (ref 0.0–2.0)
Bicarbonate: 26.7 mEq/L — ABNORMAL HIGH (ref 20.0–24.0)
O2 Saturation: 60 %
TCO2: 28 mmol/L (ref 0–100)
pO2, Ven: 35 mmHg (ref 30.0–45.0)

## 2013-01-15 LAB — BASIC METABOLIC PANEL
CO2: 26 mEq/L (ref 19–32)
Calcium: 9.6 mg/dL (ref 8.4–10.5)
Glucose, Bld: 164 mg/dL — ABNORMAL HIGH (ref 70–99)
Potassium: 4.7 mEq/L (ref 3.5–5.1)
Sodium: 134 mEq/L — ABNORMAL LOW (ref 135–145)

## 2013-01-15 LAB — LIPID PANEL
Cholesterol: 245 mg/dL — ABNORMAL HIGH (ref 0–200)
HDL: 22 mg/dL — ABNORMAL LOW (ref >39)
LDL Cholesterol: 155 mg/dL — ABNORMAL HIGH (ref 0–99)
Total CHOL/HDL Ratio: 11.1 RATIO
Triglycerides: 340 mg/dL — ABNORMAL HIGH (ref <150)

## 2013-01-15 LAB — POCT I-STAT 3, ART BLOOD GAS (G3+)
Acid-base deficit: 2 mmol/L (ref 0.0–2.0)
Bicarbonate: 24.4 mEq/L — ABNORMAL HIGH (ref 20.0–24.0)
O2 Saturation: 95 %
TCO2: 26 mmol/L (ref 0–100)
pO2, Arterial: 81 mmHg (ref 80.0–100.0)

## 2013-01-15 LAB — CBC
HCT: 40 % (ref 39.0–52.0)
Hemoglobin: 13.6 g/dL (ref 13.0–17.0)
MCH: 29.8 pg (ref 26.0–34.0)
MCHC: 34 g/dL (ref 30.0–36.0)
WBC: 7.4 10*3/uL (ref 4.0–10.5)

## 2013-01-15 LAB — GLUCOSE, CAPILLARY: Glucose-Capillary: 158 mg/dL — ABNORMAL HIGH (ref 70–99)

## 2013-01-15 SURGERY — LEFT AND RIGHT HEART CATHETERIZATION WITH CORONARY/GRAFT ANGIOGRAM
Anesthesia: LOCAL

## 2013-01-15 MED ORDER — MIDAZOLAM HCL 2 MG/2ML IJ SOLN
INTRAMUSCULAR | Status: AC
  Filled 2013-01-15: qty 2

## 2013-01-15 MED ORDER — NITROGLYCERIN 0.2 MG/ML ON CALL CATH LAB
INTRAVENOUS | Status: AC
  Filled 2013-01-15: qty 1

## 2013-01-15 MED ORDER — BISACODYL 10 MG RE SUPP
10.0000 mg | Freq: Every day | RECTAL | Status: DC | PRN
  Filled 2013-01-15: qty 1

## 2013-01-15 MED ORDER — SODIUM CHLORIDE 0.9 % IV SOLN
INTRAVENOUS | Status: DC
  Administered 2013-01-15: 16:00:00 via INTRAVENOUS

## 2013-01-15 MED ORDER — ONDANSETRON HCL 4 MG/2ML IJ SOLN: 4.0000 mg | Freq: Four times a day (QID) | INTRAMUSCULAR | Status: DC | PRN

## 2013-01-15 MED ORDER — FENTANYL CITRATE 0.05 MG/ML IJ SOLN
INTRAMUSCULAR | Status: AC
  Filled 2013-01-15: qty 2

## 2013-01-15 MED ORDER — HEPARIN (PORCINE) IN NACL 2-0.9 UNIT/ML-% IJ SOLN
INTRAMUSCULAR | Status: AC
  Filled 2013-01-15: qty 1000

## 2013-01-15 MED ORDER — LIDOCAINE HCL (PF) 1 % IJ SOLN
INTRAMUSCULAR | Status: AC
  Filled 2013-01-15: qty 30

## 2013-01-15 MED ORDER — HYDROCODONE-ACETAMINOPHEN 5-325 MG PO TABS
ORAL_TABLET | ORAL | Status: AC
  Filled 2013-01-15: qty 2

## 2013-01-15 MED ORDER — ACETAMINOPHEN 325 MG PO TABS: 650.0000 mg | ORAL_TABLET | ORAL | Status: DC | PRN

## 2013-01-15 NOTE — CV Procedure (Signed)
     Cardiac Cath Note  Timothy Trujillo [id number] 07-26-1950  Procedure: Right and Left  Heart Cardiac Catheterization Note Indications: acute systolic CHF, CAD  Procedure Details Consent: Obtained Time Out: Verified patient identification, verified procedure, site/side was marked, verified correct patient position, special equipment/implants available, Radiology Safety Procedures followed,  medications/allergies/relevent history reviewed, required imaging and test results available.  Performed   Medications: Fentanyl: 75 mcg IV  Versed: 3 mg IV  The right femoral artery and right femoral vein were  easily canulated using a modified Seldinger technique.  Hemodynamics:   RA: 10/0/8 RV: 42/6 PCWP: 13/14/13 PA:  45/22 (30)  Cardiac Output   Thermodilution: 4.63 Index 1.82  Fick : 5.22   Index 2.06  Arterial Sat: 95% PA Sat: 60%.  LV pressure: 110//21 Aortic pressure: 110/66  Angiography   Left Main: 60% ostial LM, moderate irreg   Left anterior Descending: diffusely diseased, 20-40% throughout, 60% mid stenosis just after the large 1st diag  Left Circumflex: proximal stent, patent with small amount of ISR,  The distal LCX is occluded after giving off a moderate sized OM  Right Coronary Artery: occluded prox  SVG to RCA:  Occluded  SVG to ? OM ( vs. LAD) :  Occluded prox.  There are no clips to suggest that he has a LIMA.  There is no competitive flow in the LAD to suggest a graft.  LV Gram: not performed due to the risk of toxic contrast nephropathy  Complications: No apparent complications Patient did tolerate procedure well.  Contrast used: 45 cc  Conclusions:   1. CAD:  The LM is ~ 60% .  The LAD has mild - moderate irregularities.  The distal LCx is occluded and the prox RCA is occluded.  Both SVGs are occluded  2.  Poor LV function by echo.   LVG was not done in order to save contrast.   Given his risk factors ( poorly controlled DM, CHF) I think  medical management is indicated.  He will follow up with Dr. Wyline Mood.   Vesta Mixer, Montez Hageman., MD, [facility] 01/15/2013, 2:30 PM Office - [phone] Pager [phone]

## 2013-01-15 NOTE — Progress Notes (Signed)
Patient: Timothy Trujillo / Admit Date: 01/08/2013 / Date of Encounter: 01/15/2013, 12:43 PM   Subjective  Timothy Trujillo is a 62 yo with hx of CAD, s/p CABG who was admitted at [facility] and then transferred to [facility] for further evaluation of his CP.   No further CP since last night. Feels OK.    Troponin levels are negative    Objective   Telemetry: NSR  Physical Exam: Blood pressure 113/71, pulse 97, temperature 97.5 F (36.4 C), temperature source Oral, resp. rate 20, height 6' 3" (1.905 m), weight 281 lb 9.6 oz (127.733 kg), SpO2 98.00%. General: Well developed, obese WM in no acute distress. Laying flat in bed without resp distress. Head: Normocephalic, atraumatic, sclera non-icteric, no xanthomas, nares are without discharge. Neck: JVP not elevated. Lungs: Clear bilaterally to auscultation without wheezes, rales, or rhonchi. Breathing is unlabored. Heart: RRR S1 S2 without murmurs, rubs, or gallops.  Abdomen: Soft, non-tender, non-distended with normoactive bowel sounds. No rebound/guarding. Extremities: No clubbing or cyanosis. No edema. Distal pedal pulses are 2+ and equal bilaterally. Neuro: Alert and oriented X 3. Moves all extremities spontaneously. Psych:  Responds to questions appropriately with a normal affect.   Intake/Output Summary (Last 24 hours) at 01/15/13 1243 Last data filed at 01/15/13 1025  Gross per 24 hour  Intake    540 ml  Output   2655 ml  Net  -2115 ml    Inpatient Medications:  . aspirin  81 mg Oral Daily  . atorvastatin  40 mg Oral q1800  . insulin aspart  0-20 Units Subcutaneous TID WC  . insulin aspart  0-5 Units Subcutaneous QHS  . insulin aspart  9 Units Subcutaneous TID WC  . Living Better with Heart Failure Book   Does not apply Once  . sodium chloride  3 mL Intravenous Q12H   Infusions:  . sodium chloride 75 mL/hr at 01/14/13 1951  . heparin 1,900 Units/hr (01/14/13 1400)    Labs:  Recent Labs  01/14/13 1117 01/15/13 0800  NA 132*  134*  K 4.7 4.7  CL 91* 97  CO2 28 26  GLUCOSE 208* 164*  BUN 51* 47*  CREATININE 2.09* 1.40*  CALCIUM 9.6 9.6    Recent Labs  01/14/13 0935 01/15/13 0800  WBC 8.1 7.4  HGB 14.5 13.6  HCT 41.0 40.0  MCV 88.0 87.7  PLT 243 254    Radiology/Studies:  Dg Chest Port 1 View  01/09/2013   CLINICAL DATA:  Followup congestive heart failure.  EXAM: PORTABLE CHEST - 1 VIEW  COMPARISON:  01/08/2013  FINDINGS: Lungs are somewhat hypoinflated with continued hazy perihilar opacification likely interstitial edema although cannot exclude infection. There has been mild interval improvement in this airspace process with slightly better aeration. Subtle blunting of the right costophrenic angle likely a small amount of right pleural fluid. Mild stable cardiomegaly. Remainder of the exam is unchanged.  IMPRESSION: Interval improvement in mild hazy perihilar opacification likely improving interstitial edema, although cannot exclude infection. Possible small amount of right pleural fluid.  Stable cardiomegaly.   Electronically Signed   By: Daniel  Boyle M.D.   On: 01/09/2013 08:52   Dg Chest Portable 1 View  01/08/2013   CLINICAL DATA:  Shortness of breath  EXAM: PORTABLE CHEST - 1 VIEW  COMPARISON:  08/14/2012  FINDINGS: Cardiac shadow remains enlarged. Postsurgical changes are again noted. Increased vascular congestion and pulmonary edema is noted. No focal infiltrate is seen.  IMPRESSION: Severe congestive failure with pulmonary   edema.   Electronically Signed   By: Mark  Lukens M.D.   On: 01/08/2013 15:19   Dg Shoulder Left Port  01/09/2013   *RADIOLOGY REPORT*  Clinical Data: Post fall 1 week ago now with persistent anterior shoulder pain, initial encounter.  PORTABLE LEFT SHOULDER - 2+ VIEW  Comparison: None.  Findings: Osteopenia.  No definite fracture or dislocation. Given obliquity, mild degenerative changes of the left glenohumeral joint is suspected with joint space loss, subchondral sclerosis and  osteophytosis.  Suspect mild degenerative change of the AC joint. Potential minimal amount of calcific tendonitis.  Limited visualization of adjacent thorax is normal.  Regional soft tissues are normal.  IMPRESSION: 1.  Osteopenia without definite acute finding. 2.  Mild degenerative change of the glenohumeral and acromioclavicular joints with possible mild calcific tendonitis. Further evaluation with shoulder MRI may be performed as clinically indicated.   Original Report Authenticated By: John Watts V, MD     Assessment and Plan  1. Unstable angina with known history of CAD s/p prior CABG/PCI Pt was originally scheduled for a cath yesterday but it was postponed due to elevated creatinine.  He has been gently hydrated and his creatinine today is back to baseline - 1.4 .  He remains pain free.  Will proceed with cath today.      2. Acute on chronic CHF with worsening ICM (EF 15-20% by echo 10/6) 3. HTN with episode of hypotension last night 4. Acute renal failure with oliguria, likely due to overdiuresis 5. IDDM, uncontrolled, A1C 12.8   Philip J. Nahser, Jr., MD, FACC 01/15/2013, 12:48 PM Office - [phone] Pager [phone]  

## 2013-01-15 NOTE — Progress Notes (Signed)
ANTICOAGULATION CONSULT NOTE  Pharmacy Consult for heparin Indication: chest pain/ACS  Allergies  Allergen Reactions  . Stadol [Butorphanol Tartrate] Other (See Comments)    "makes him crazy"    Patient Measurements: Height: 6\' 3"  (190.5 cm) Weight: 281 lb 9.6 oz (127.733 kg) (scale B) IBW/kg (Calculated) : 84.5 Heparin Dosing Weight: 112 kg  Vital Signs: Temp: 97.5 F (36.4 C) (10/10 0543) Temp src: Oral (10/10 0543) BP: 108/68 mmHg (10/10 0543) Pulse Rate: 91 (10/10 0543)  Labs:  Recent Labs  01/13/13 0554 01/14/13 0935 01/14/13 1117 01/14/13 1930 01/15/13 0800  HGB 15.1 14.5  --   --  13.6  HCT 44.0 41.0  --   --  40.0  PLT 262 243  --   --  254  LABPROT  --  12.7  --   --   --   INR  --  0.97  --   --   --   HEPARINUNFRC  --  0.21*  --  0.55 0.54  CREATININE 1.34 2.28* 2.09*  --  1.40*    Estimated Creatinine Clearance: 76.8 ml/min (by C-G formula based on Cr of 1.4).    Assessment: The 6hr heparin level is 0.5 on 1900 units/hr IV heparin infusion in this 62 yo male on IV heparin for chest pain.  Level is therapeutic. No bleeding reported. Scr down to 1.4 this am, plan is for cath today.  Goal of Therapy:  Heparin level 0.3-0.7 units/ml Monitor platelets by anticoagulation protocol: Yes   Plan:  Continue heparin rate at 1900 units/hr. Daily heparin level and CBC Follow after cath  Sheppard Coil PharmD., BCPS Clinical Pharmacist Pager [phone] 01/15/2013 10:14 AM

## 2013-01-15 NOTE — Interval H&P Note (Signed)
Cath Lab Visit (complete for each Cath Lab visit)  Clinical Evaluation Leading to the Procedure:   ACS: no  Non-ACS:    Anginal Classification: CCS IV  Anti-ischemic medical therapy: Maximal Therapy (2 or more classes of medications)  Non-Invasive Test Results: No non-invasive testing performed  Prior CABG: Previous CABG      History and Physical Interval Note:  01/15/2013 1:29 PM  Huntley Dec  has presented today for surgery, with the diagnosis of Chest pain  The various methods of treatment have been discussed with the patient and family. After consideration of risks, benefits and other options for treatment, the patient has consented to  Procedure(s): LEFT AND RIGHT HEART CATHETERIZATION WITH CORONARY/GRAFT ANGIOGRAM (N/A) as a surgical intervention .  The patient's history has been reviewed, patient examined, no change in status, stable for surgery.  I have reviewed the patient's chart and labs.  Questions were answered to the patient's satisfaction.     Elyn Aquas.

## 2013-01-15 NOTE — H&P (View-Only) (Signed)
Patient: BRIANNA ESSON / Admit Date: 01/08/2013 / Date of Encounter: 01/15/2013, 12:43 PM   Subjective  Mr. Dunlap is a 62 yo with hx of CAD, s/p CABG who was admitted at [facility] and then transferred to [facility] for further evaluation of his CP.   No further CP since last night. Feels OK.    Troponin levels are negative    Objective   Telemetry: NSR  Physical Exam: Blood pressure 113/71, pulse 97, temperature 97.5 F (36.4 C), temperature source Oral, resp. rate 20, height 6\' 3"  (1.905 m), weight 281 lb 9.6 oz (127.733 kg), SpO2 98.00%. General: Well developed, obese WM in no acute distress. Laying flat in bed without resp distress. Head: Normocephalic, atraumatic, sclera non-icteric, no xanthomas, nares are without discharge. Neck: JVP not elevated. Lungs: Clear bilaterally to auscultation without wheezes, rales, or rhonchi. Breathing is unlabored. Heart: RRR S1 S2 without murmurs, rubs, or gallops.  Abdomen: Soft, non-tender, non-distended with normoactive bowel sounds. No rebound/guarding. Extremities: No clubbing or cyanosis. No edema. Distal pedal pulses are 2+ and equal bilaterally. Neuro: Alert and oriented X 3. Moves all extremities spontaneously. Psych:  Responds to questions appropriately with a normal affect.   Intake/Output Summary (Last 24 hours) at 01/15/13 1243 Last data filed at 01/15/13 1025  Gross per 24 hour  Intake    540 ml  Output   2655 ml  Net  -2115 ml    Inpatient Medications:  . aspirin  81 mg Oral Daily  . atorvastatin  40 mg Oral q1800  . insulin aspart  0-20 Units Subcutaneous TID WC  . insulin aspart  0-5 Units Subcutaneous QHS  . insulin aspart  9 Units Subcutaneous TID WC  . Living Better with Heart Failure Book   Does not apply Once  . sodium chloride  3 mL Intravenous Q12H   Infusions:  . sodium chloride 75 mL/hr at 01/14/13 1951  . heparin 1,900 Units/hr (01/14/13 1400)    Labs:  Recent Labs  01/14/13 1117 01/15/13 0800  NA 132*  134*  K 4.7 4.7  CL 91* 97  CO2 28 26  GLUCOSE 208* 164*  BUN 51* 47*  CREATININE 2.09* 1.40*  CALCIUM 9.6 9.6    Recent Labs  01/14/13 0935 01/15/13 0800  WBC 8.1 7.4  HGB 14.5 13.6  HCT 41.0 40.0  MCV 88.0 87.7  PLT 243 254    Radiology/Studies:  Dg Chest Port 1 View  01/09/2013   CLINICAL DATA:  Followup congestive heart failure.  EXAM: PORTABLE CHEST - 1 VIEW  COMPARISON:  01/08/2013  FINDINGS: Lungs are somewhat hypoinflated with continued hazy perihilar opacification likely interstitial edema although cannot exclude infection. There has been mild interval improvement in this airspace process with slightly better aeration. Subtle blunting of the right costophrenic angle likely a small amount of right pleural fluid. Mild stable cardiomegaly. Remainder of the exam is unchanged.  IMPRESSION: Interval improvement in mild hazy perihilar opacification likely improving interstitial edema, although cannot exclude infection. Possible small amount of right pleural fluid.  Stable cardiomegaly.   Electronically Signed   By: Elberta Fortis M.D.   On: 01/09/2013 08:52   Dg Chest Portable 1 View  01/08/2013   CLINICAL DATA:  Shortness of breath  EXAM: PORTABLE CHEST - 1 VIEW  COMPARISON:  08/14/2012  FINDINGS: Cardiac shadow remains enlarged. Postsurgical changes are again noted. Increased vascular congestion and pulmonary edema is noted. No focal infiltrate is seen.  IMPRESSION: Severe congestive failure with pulmonary  edema.   Electronically Signed   By: Alcide Clever M.D.   On: 01/08/2013 15:19   Dg Shoulder Left Port  01/09/2013   *RADIOLOGY REPORT*  Clinical Data: Post fall 1 week ago now with persistent anterior shoulder pain, initial encounter.  PORTABLE LEFT SHOULDER - 2+ VIEW  Comparison: None.  Findings: Osteopenia.  No definite fracture or dislocation. Given obliquity, mild degenerative changes of the left glenohumeral joint is suspected with joint space loss, subchondral sclerosis and  osteophytosis.  Suspect mild degenerative change of the [facility] joint. Potential minimal amount of calcific tendonitis.  Limited visualization of adjacent thorax is normal.  Regional soft tissues are normal.  IMPRESSION: 1.  Osteopenia without definite acute finding. 2.  Mild degenerative change of the glenohumeral and acromioclavicular joints with possible mild calcific tendonitis. Further evaluation with shoulder MRI may be performed as clinically indicated.   Original Report Authenticated By: Tacey Ruiz, MD     Assessment and Plan  1. Unstable angina with known history of CAD s/p prior CABG/PCI Pt was originally scheduled for a cath yesterday but it was postponed due to elevated creatinine.  He has been gently hydrated and his creatinine today is back to baseline - 1.4 .  He remains pain free.  Will proceed with cath today.      2. Acute on chronic CHF with worsening ICM (EF 15-20% by echo 10/6) 3. HTN with episode of hypotension last night 4. Acute renal failure with oliguria, likely due to overdiuresis 5. IDDM, uncontrolled, A1C 12.8   Vesta Mixer, Montez Hageman., MD, [facility] 01/15/2013, 12:48 PM Office - [phone] Pager [phone]

## 2013-01-16 ENCOUNTER — Encounter (HOSPITAL_COMMUNITY): Payer: Self-pay | Admitting: Internal Medicine

## 2013-01-16 ENCOUNTER — Other Ambulatory Visit: Payer: Self-pay

## 2013-01-16 DIAGNOSIS — I255 Ischemic cardiomyopathy: Secondary | ICD-10-CM

## 2013-01-16 DIAGNOSIS — J449 Chronic obstructive pulmonary disease, unspecified: Secondary | ICD-10-CM

## 2013-01-16 DIAGNOSIS — R000 Tachycardia, unspecified: Secondary | ICD-10-CM

## 2013-01-16 HISTORY — DX: Chronic obstructive pulmonary disease, unspecified: J44.9

## 2013-01-16 LAB — CBC
HCT: 40.5 % (ref 39.0–52.0)
Hemoglobin: 14 g/dL (ref 13.0–17.0)
MCH: 30.6 pg (ref 26.0–34.0)
MCV: 88.6 fL (ref 78.0–100.0)
RBC: 4.57 MIL/uL (ref 4.22–5.81)
RDW: 14.8 % (ref 11.5–15.5)
WBC: 8.1 10*3/uL (ref 4.0–10.5)

## 2013-01-16 LAB — GLUCOSE, CAPILLARY: Glucose-Capillary: 144 mg/dL — ABNORMAL HIGH (ref 70–99)

## 2013-01-16 MED ORDER — CARVEDILOL 3.125 MG PO TABS
3.1250 mg | ORAL_TABLET | Freq: Two times a day (BID) | ORAL | Status: DC
  Administered 2013-01-16 – 2013-01-17 (×3): 3.125 mg via ORAL
  Filled 2013-01-16 (×5): qty 1

## 2013-01-16 NOTE — Progress Notes (Signed)
Patient Name: Timothy Trujillo      SUBJECTIVE: 62 yo with known CAD remote CABG and multiple stents with LV dysfunction persistent >1 yr EF 25% admitted with A/C CHF';  He has been vigoroulsy diuresed (-11L)  Cath > patent but diffusely diseased LAD with patent SVG to prox circ with occluded RCA and distal circ  Hospitalization complicated by acute renal insufficiency which improved prior to catheterization yesterday  Still sob but better  Chronic sinus tach (verified >>2013)  Past Medical History  Diagnosis Date  . Diabetes mellitus   . Cerebral artery occlusion   . Chronic systolic heart failure   . Obesity   . Hyperlipidemia   . Peripheral vascular disease   . Hypertension   . Asthma   . CAD (coronary artery disease)     prior CABG and Stenting Cath 2014   . Ischemic cardiomyopathy     EF 25%   . Stroke   . Headache(784.0)   . Arthritis   . Anxiety   . COPD (chronic obstructive pulmonary disease)   . Pneumonia   . Depression   . COPD, severe  O2 dependent 01/16/2013    Scheduled Meds:  Scheduled Meds: . aspirin  81 mg Oral Daily  . atorvastatin  40 mg Oral q1800  . insulin aspart  0-20 Units Subcutaneous TID WC  . insulin aspart  0-5 Units Subcutaneous QHS  . insulin aspart  9 Units Subcutaneous TID WC  . Living Better with Heart Failure Book   Does not apply Once  . sodium chloride  3 mL Intravenous Q12H   Continuous Infusions: . sodium chloride 125 mL/hr at 01/15/13 1600    PHYSICAL EXAM Filed Vitals:   01/15/13 1700 01/15/13 1800 01/15/13 2200 01/16/13 0626  BP: 108/68 109/86 113/67 123/70  Pulse: 77 91 102 99  Temp:   97.5 F (36.4 C) 97.9 F (36.6 C)  TempSrc:   Oral Oral  Resp:  20 20 20   Height:      Weight:    278 lb 7.1 oz (126.3 kg)  SpO2:   97% 97%    General appearance: alert, cooperative, mild distress and morbidly obese Neck: no JVD Lungs: rales bibasilar Heart: rapid regular rate and rhythm early murmur Abdomen: soft,  non-tender; bowel sounds normal; no masses,  no organomegaly Extremities: extremities normal, atraumatic, no cyanosis or edema Skin: Skin color, texture, turgor normal. No rashes or lesions Neurologic: Grossly normal  TELEMETRY: Reviewed telemetry pt in sinus tach:  ECG  Narrow QRS   Intake/Output Summary (Last 24 hours) at 01/16/13 1030 Last data filed at 01/16/13 0945  Gross per 24 hour  Intake    782 ml  Output   1226 ml  Net   -444 ml    LABS: Basic Metabolic Panel:  Recent Labs Lab 01/11/13 0510 01/12/13 0443 01/13/13 0554 01/14/13 0935 01/14/13 1117 01/15/13 0800  NA 135 136 134* 131* 132* 134*  K 4.4 4.5 4.0 4.6 4.7 4.7  CL 94* 94* 93* 92* 91* 97  CO2 30 30 27 25 28 26   GLUCOSE 205* 226* 193* 192* 208* 164*  BUN 26* 32* 35* 51* 51* 47*  CREATININE 1.30 1.27 1.34 2.28* 2.09* 1.40*  CALCIUM 10.1 10.3 10.1 9.7 9.6 9.6   Cardiac Enzymes: No results found for this basename: CKTOTAL, CKMB, CKMBINDEX, TROPONINI,  in the last 72 hours CBC:  Recent Labs Lab 01/13/13 0554 01/14/13 0935 01/15/13 0800 01/16/13 0550  WBC 7.1 8.1 7.4  8.1  HGB 15.1 14.5 13.6 14.0  HCT 44.0 41.0 40.0 40.5  MCV 88.9 88.0 87.7 88.6  PLT 262 243 254 255   PROTIME:  Recent Labs  01/14/13 0935  LABPROT 12.7  INR 0.97   Liver Function Tests: No results found for this basename: AST, ALT, ALKPHOS, BILITOT, PROT, ALBUMIN,  in the last 72 hours No results found for this basename: LIPASE, AMYLASE,  in the last 72 hours BNP: BNP (last 3 results)  Recent Labs  01/08/13 1527 01/09/13 0527 01/11/13 0510  PROBNP 1740.0* 1418.0* 1116.0*   D-Dimer: No results found for this basename: DDIMER,  in the last 72 hours Hemoglobin A1C: No results found for this basename: HGBA1C,  in the last 72 hours Fasting Lipid Panel:  Recent Labs  01/15/13 0800  CHOL 245*  HDL 22*  LDLCALC 155*  TRIG 340*  CHOLHDL 11.1   ASSESSMENT AND PLAN:  Principal Problem:   Acute on chronic  systolic heart failure Active Problems:   Ischemic cardiomyopathy   Obesity   Diabetes mellitus   Hypertension   Cerebrovascular disease   Sinus tachycardia   COPD, severe  O2 dependent  Will begin low dose coreg this may also help with his sinus tachycardia the cause of which is not clear. Thyroid testing was normal a few weeks ago [employer] Consider ICD as outpt  Signed, Sherryl Manges MD  01/16/2013

## 2013-01-17 ENCOUNTER — Inpatient Hospital Stay (HOSPITAL_COMMUNITY): Payer: Medicare Other

## 2013-01-17 LAB — BASIC METABOLIC PANEL
BUN: 27 mg/dL — ABNORMAL HIGH (ref 6–23)
CO2: 27 mEq/L (ref 19–32)
Chloride: 98 mEq/L (ref 96–112)
Creatinine, Ser: 1.07 mg/dL (ref 0.50–1.35)
GFR calc non Af Amer: 71 mL/min — ABNORMAL LOW (ref >90)
Glucose, Bld: 219 mg/dL — ABNORMAL HIGH (ref 70–99)

## 2013-01-17 LAB — GLUCOSE, CAPILLARY
Glucose-Capillary: 203 mg/dL — ABNORMAL HIGH (ref 70–99)
Glucose-Capillary: 275 mg/dL — ABNORMAL HIGH (ref 70–99)
Glucose-Capillary: 245 mg/dL — ABNORMAL HIGH (ref 70–99)
Glucose-Capillary: 126 mg/dL — ABNORMAL HIGH (ref 70–99)

## 2013-01-17 LAB — CBC
HCT: 39.2 % (ref 39.0–52.0)
MCH: 31.5 pg (ref 26.0–34.0)
MCHC: 35.7 g/dL (ref 30.0–36.0)
MCV: 88.1 fL (ref 78.0–100.0)
Platelets: 257 10*3/uL (ref 150–400)
RBC: 4.45 MIL/uL (ref 4.22–5.81)
RDW: 14.7 % (ref 11.5–15.5)
WBC: 8.3 10*3/uL (ref 4.0–10.5)

## 2013-01-17 MED ORDER — CARVEDILOL 6.25 MG PO TABS
6.2500 mg | ORAL_TABLET | Freq: Two times a day (BID) | ORAL | Status: DC
  Administered 2013-01-17 – 2013-01-18 (×2): 6.25 mg via ORAL
  Filled 2013-01-17 (×4): qty 1

## 2013-01-17 MED ORDER — ALPRAZOLAM 0.5 MG PO TABS
0.5000 mg | ORAL_TABLET | Freq: Two times a day (BID) | ORAL | Status: DC | PRN
  Administered 2013-01-17: 0.5 mg via ORAL
  Filled 2013-01-17: qty 1

## 2013-01-17 MED ORDER — FUROSEMIDE 10 MG/ML IJ SOLN
40.0000 mg | Freq: Once | INTRAMUSCULAR | Status: AC
  Administered 2013-01-17: 40 mg via INTRAVENOUS

## 2013-01-17 MED ORDER — FUROSEMIDE 10 MG/ML IJ SOLN
INTRAMUSCULAR | Status: AC
  Administered 2013-01-17: 40 mg via INTRAVENOUS
  Filled 2013-01-17: qty 4

## 2013-01-17 NOTE — Progress Notes (Signed)
Feeling much better Will get case manager involved HR a little better  Will increase coreg ever so slightly

## 2013-01-17 NOTE — Progress Notes (Addendum)
Patient Name: Timothy Trujillo Date of Encounter: 01/17/2013  Principal Problem:   Acute on chronic systolic heart failure Active Problems:   Obesity   Diabetes mellitus   Hypertension   Cerebrovascular disease   Ischemic cardiomyopathy   Sinus tachycardia   COPD, severe  O2 dependent    SUBJECTIVE: Had chest pain last pm, relieved by SL NTG x 2  SOB began last pm about midnight. He has a history of panic attacks and feels anxious.  OBJECTIVE Filed Vitals:   01/16/13 2155 01/16/13 2158 01/16/13 2203 01/17/13 0515  BP: 125/76 110/68 112/65 116/69  Pulse: 77 92 95 90  Temp:    97.7 F (36.5 C)  TempSrc:    Oral  Resp: 24 22 20 20   Height:      Weight:    278 lb 14.4 oz (126.508 kg)  SpO2: 100% 99% 99% 98%    Intake/Output Summary (Last 24 hours) at 01/17/13 0918 Last data filed at 01/17/13 0500  Gross per 24 hour  Intake    640 ml  Output   1550 ml  Net   -910 ml   Filed Weights   01/15/13 0543 01/16/13 0626 01/17/13 0515  Weight: 281 lb 9.6 oz (127.733 kg) 278 lb 7.1 oz (126.3 kg) 278 lb 14.4 oz (126.508 kg)    PHYSICAL EXAM General: Well developed, well nourished, male appears anxious with some respiratory distress. Head: Normocephalic, atraumatic.  Neck: Supple without bruits, JVD at 10 cm. Lungs:  Resp regular and unlabored, rales bilateral bases, left > right. Heart: RRR, S1, S2, no S3, S4, or murmur; no rub. Abdomen: Soft, non-tender, non-distended, BS + x 4.  Extremities: No clubbing, cyanosis, no edema.  Neuro: Alert and oriented X 3. Moves all extremities spontaneously. Psych: Normal affect.  LABS: CBC: Recent Labs  01/16/13 0550 01/17/13 0514  WBC 8.1 8.3  HGB 14.0 14.0  HCT 40.5 39.2  MCV 88.6 88.1  PLT 255 257   INR: Recent Labs  01/14/13 0935  INR 0.97   Basic Metabolic Panel: Recent Labs  01/15/13 0800 01/17/13 0514  NA 134* 136  K 4.7 4.6  CL 97 98  CO2 26 27  GLUCOSE 164* 219*  BUN 47* 27*  CREATININE 1.40* 1.07   CALCIUM 9.6 9.8   BNP: Pro B Natriuretic peptide (BNP)  Date/Time Value Range Status  01/11/2013  5:10 AM 1116.0* 0 - 125 pg/mL Final  01/09/2013  5:27 AM 1418.0* 0 - 125 pg/mL Final   Fasting Lipid Panel: Recent Labs  01/15/13 0800  CHOL 245*  HDL 22*  LDLCALC 155*  TRIG 340*  CHOLHDL 11.1   TELE:    Radiology/Studies: No results found.   Current Medications:  . aspirin  81 mg Oral Daily  . atorvastatin  40 mg Oral q1800  . carvedilol  3.125 mg Oral BID WC  . furosemide      . furosemide  40 mg Intravenous Once  . insulin aspart  0-20 Units Subcutaneous TID WC  . insulin aspart  0-5 Units Subcutaneous QHS  . insulin aspart  9 Units Subcutaneous TID WC  . Living Better with Heart Failure Book   Does not apply Once  . sodium chloride  3 mL Intravenous Q12H   . sodium chloride 125 mL/hr at 01/15/13 1600    ASSESSMENT AND PLAN: 62 yo male admitted 10/03 with CHF, hx ICM and CAD. Ez neg MI, cath w/ CAD, LM 60, LAD 60, RCA  100, dist CFX 100, OM OK, medical therapy. EF 15-20% by echo. Diuresed at first, then Lasix held due to Cr 2.28.  Principal Problem:   Acute on chronic systolic heart failure - weight is unchanged but pt has JVD elevation. Will give Lasix 40 mg x 1 (was on 80 mg bid early this admit). Follow I/O and weights, recheck BNP and BMET in am. May be able to change to oral Lasix then.   Anxiety - will order PRN Xanax  Otherwise, continue current Rx. Active Problems:   Obesity    Diabetes mellitus   Hypertension   Cerebrovascular disease   Ischemic cardiomyopathy   Sinus tachycardia   COPD, severe  O2 dependent   SignedTheodore Demark , PA-C 9:18 AM 01/17/2013

## 2013-01-17 NOTE — Progress Notes (Signed)
Patient C/O C/P @ 2154. C/P protocol initiated also with EKG & V/S ( no changes). Nitro SL were given x 2  With  0/10 C/P  After second & final dose tonight.

## 2013-01-17 NOTE — Progress Notes (Signed)
Tamera Punt notified of C/P and interventions. New order placed for  2 view CXR.

## 2013-01-18 ENCOUNTER — Encounter (HOSPITAL_COMMUNITY): Payer: Self-pay | Admitting: Physician Assistant

## 2013-01-18 ENCOUNTER — Telehealth: Payer: Self-pay | Admitting: *Deleted

## 2013-01-18 DIAGNOSIS — I2510 Atherosclerotic heart disease of native coronary artery without angina pectoris: Secondary | ICD-10-CM

## 2013-01-18 LAB — BASIC METABOLIC PANEL
CO2: 25 mEq/L (ref 19–32)
GFR calc non Af Amer: 86 mL/min — ABNORMAL LOW (ref >90)
Glucose, Bld: 229 mg/dL — ABNORMAL HIGH (ref 70–99)
Potassium: 4.1 mEq/L (ref 3.5–5.1)
Sodium: 133 mEq/L — ABNORMAL LOW (ref 135–145)

## 2013-01-18 LAB — CBC
HCT: 39.9 % (ref 39.0–52.0)
Hemoglobin: 14 g/dL (ref 13.0–17.0)
MCH: 30.8 pg (ref 26.0–34.0)
MCHC: 35.1 g/dL (ref 30.0–36.0)
MCV: 87.7 fL (ref 78.0–100.0)
Platelets: 252 10*3/uL (ref 150–400)
RBC: 4.55 MIL/uL (ref 4.22–5.81)
RDW: 14.7 % (ref 11.5–15.5)
WBC: 8.9 10*3/uL (ref 4.0–10.5)

## 2013-01-18 LAB — PRO B NATRIURETIC PEPTIDE: Pro B Natriuretic peptide (BNP): 1270 pg/mL — ABNORMAL HIGH (ref 0–125)

## 2013-01-18 LAB — GLUCOSE, CAPILLARY: Glucose-Capillary: 203 mg/dL — ABNORMAL HIGH (ref 70–99)

## 2013-01-18 MED ORDER — ATORVASTATIN CALCIUM 40 MG PO TABS: 40.0000 mg | ORAL_TABLET | Freq: Every evening | ORAL | Status: DC

## 2013-01-18 MED ORDER — LISINOPRIL 2.5 MG PO TABS
2.5000 mg | ORAL_TABLET | Freq: Every day | ORAL | Status: DC
  Administered 2013-01-18: 2.5 mg via ORAL
  Filled 2013-01-18: qty 1

## 2013-01-18 MED ORDER — ASPIRIN 81 MG PO TABS: 81.0000 mg | ORAL_TABLET | Freq: Every day | ORAL | Status: DC

## 2013-01-18 MED ORDER — CARVEDILOL 6.25 MG PO TABS: 6.2500 mg | ORAL_TABLET | Freq: Two times a day (BID) | ORAL | Status: DC

## 2013-01-18 MED ORDER — FUROSEMIDE 40 MG PO TABS: 40.0000 mg | ORAL_TABLET | Freq: Every day | ORAL | Status: DC

## 2013-01-18 MED ORDER — LISINOPRIL 2.5 MG PO TABS: 2.5000 mg | ORAL_TABLET | Freq: Every day | ORAL | Status: DC

## 2013-01-18 NOTE — Telephone Encounter (Signed)
TCM BEING DISCHARGED TO DAY F/U WITH KATHRYN 01/26/13

## 2013-01-18 NOTE — Progress Notes (Signed)
SATURATION QUALIFICATIONS: (This note is used to comply with regulatory documentation for home oxygen)  Patient Saturations on 3L at Rest = 98%  Patient Saturations on 3L while Ambulating = 91%  Patient Saturations on 3 Liters of oxygen while Ambulating = 91%  Please briefly explain why patient needs home oxygen: Pt is home dependent on 3L .

## 2013-01-18 NOTE — Progress Notes (Signed)
Pt is being discharged home. Pt is being transported home by his son. Discharge instructions given to patient. RN went over discharge instructions with patient. All questions answered.

## 2013-01-18 NOTE — Progress Notes (Signed)
Pt is alert and oriented x4. Pt is on 3L [place] and is home dependent on the oxygen. Planning on D/C patient today after he ambulates in the hallway with O2. No complaints of pain at this time

## 2013-01-18 NOTE — Discharge Summary (Signed)
Discharge Summary   Patient ID: SEDALE JENIFER MRN: [id number], DOB/AGE: [birth year] 62 y.o. Admit date: 01/08/2013 D/C date:     01/18/2013  Primary Cardiologist: New to [facility] - will follow up with Dr. Wyline Mood in [place] (lives in [place], [place])  Primary Discharge Diagnoses:  1. Unstable angina with known history of CAD s/p prior CABG/PCI  - cardiac cath 01/15/13: moderate disease in LAD, distal LCx, prox RCA occluded, 2 SVGs occluded, felt to be stable from prior cath -> for medical therapy - prior history of CABG (~20 yrs ago) with multiple stents since that time 2. Acute on chronic CHF with worsening ICM (EF 15-20% by echo 10/6)  3. Acute on chronic respiratory failure requiring bipap 4. HTN with hypotension this admission 5. Acute renal failure with oliguria, likely due to overdiuresis  6. IDDM, uncontrolled, A1C 12.8 7. Obesity Body mass index is 34.55 kg/(m^2). 8. 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 Cerebral artery occlusion 2. HLD 3. PVD 4. Asthma 5. Arthritis 6. Anxiety 7. Pneumonia 8. Depression  Hospital Course: Mr. Retzloff is a 62 y/o M with history of DM, HL, PAD, HTN, CAD with prior MI, prior CABG and PCI (do not have further information), and COPD admitted with a 1 week history of dyspnea, orthopnea, PND along with intermittent chest pain on 01/08/13. He reported a a CABG done approx 20 years ago at [facility] as well as multiple stents since that time. He was initially admitted to stepdown and managed with Bipap, nitro gtt, and IV diuretics. CXR showed severe pulm edema. He was felt to have acute on chronic respiratory failure with acute on chronic CHF. He diuresed with improvement in his dyspnea but continued to have intermittent daily CP that was relieved with SL NTG. His EF was noted to be progressively decreasing (EF 2012: 55-60, EF 12/2011: 25-30%, echo this admission 01/11/13 showing EF 15-20% - see  below for report). His acute CHF exacerbation was felt due to complete dietary noncompliance with low-salt diet. Troponins remained negative. He was started on ACEI and beta blocker.  Due to progressive ICM and chest pain, he was transferred to [facility] for planned cardiac cath once euvolemic. On the evening of 01/13/13 he developed chest pain and received SL NTG which dropped his pressure. EKG was nonacute. It was felt that he was headed towards the dry side, so Lasix was discontinued. He developed acute renal failure with oliguria and peak Cr of 2.28 which fortunately responded well to gentle IV hydration. ACEI was temporarily held. This improved to 1.40 on 01/15/13 and he was felt stable for cath. This demonstrated moderate disease in the LAD, the distal LCx and prox RCA were occluded, the 2 SVGs were occluded. Dr. Elease Hashimoto was able to speak with the patient's son that day who revealed that the cath findings were not new. He apparently had a cath 3 years ago which found similar results. These findings were not amenable to PCI or redo CABG thus medical therapy was recommended. He continued to improve over the weekend. ACEI was able to be added back as Cr fully recovered to 0.95. He required a dose of IV Lasix as he was felt to be volume overloaded again, but this was soon transitioned to oral form. In total he has diuresed -19L, adm wt 288 -> discharge weight 276. Dr. Elease Hashimoto has seen and examined the patient today and feels he is stable for  discharge.  Of note, this admission the patient's A1C was 12.8 and he was instructed to contact his primary care doctor at discharge to discuss management.  Statin was initiated this admission - consider f/u labs 6-8 weeks. Could also consider re-assessment of EF to determine if any improvement in LV function given initiation of medical therapy this admission - if EF remains low, consider referral to EP for ICD.  Discharge Vitals: Blood pressure 132/80, pulse  102, temperature 97.5 F (36.4 C), temperature source Oral, resp. rate 22, height 6\' 3"  (1.905 m), weight 276 lb 7.3 oz (125.4 kg), SpO2 90.00%.  Labs: Lab Results  Component Value Date   WBC 8.9 01/18/2013   HGB 14.0 01/18/2013   HCT 39.9 01/18/2013   MCV 87.7 01/18/2013   PLT 252 01/18/2013     Recent Labs Lab 01/18/13 0500  NA 133*  K 4.1  CL 96  CO2 25  BUN 25*  CREATININE 0.95  CALCIUM 9.5  GLUCOSE 229*   No results found for this basename: CKTOTAL, CKMB, TROPONINI,  in the last 72 hours Lab Results  Component Value Date   CHOL 245* 01/15/2013   HDL 22* 01/15/2013   LDLCALC 155* 01/15/2013   TRIG 340* 01/15/2013   Lab Results  Component Value Date   DDIMER 0.95* 01/08/2013    Diagnostic Studies/Procedures   Dg Chest 2 View  01/17/2013   CLINICAL DATA:  Shortness of breath.  EXAM: CHEST  2 VIEW  COMPARISON:  01/09/2013  FINDINGS: Patchy areas of interstitial and airspace densities are noted bilaterally, most evident in the mid lungs and bases. This may all reflect chronic fibrosis. Superimposed pneumonia or pneumonitis this is possible. The appearance is stable from the prior exam.  No pleural effusion. No pneumothorax.  Cardiac silhouette is mildly enlarged. Changes from CABG surgery are stable. The mediastinum is normal in contour.  IMPRESSION: 1. Findings are essentially stable from the most recent prior study allowing for differences in technique. 2. Patchy interstitial and airspace densities may all be chronic due to fibrosis. Superimposed infectious or inflammatory infiltrate should be considered in the proper clinical setting. 3. No convincing edema. No pleural effusion.   Electronically Signed   By: Amie Portland M.D.   On: 01/17/2013 09:38   Dg Chest Port 1 View  01/09/2013   CLINICAL DATA:  Followup congestive heart failure.  EXAM: PORTABLE CHEST - 1 VIEW  COMPARISON:  01/08/2013  FINDINGS: Lungs are somewhat hypoinflated with continued hazy perihilar  opacification likely interstitial edema although cannot exclude infection. There has been mild interval improvement in this airspace process with slightly better aeration. Subtle blunting of the right costophrenic angle likely a small amount of right pleural fluid. Mild stable cardiomegaly. Remainder of the exam is unchanged.  IMPRESSION: Interval improvement in mild hazy perihilar opacification likely improving interstitial edema, although cannot exclude infection. Possible small amount of right pleural fluid.  Stable cardiomegaly.   Electronically Signed   By: Elberta Fortis M.D.   On: 01/09/2013 08:52   Dg Chest Portable 1 View  01/08/2013   CLINICAL DATA:  Shortness of breath  EXAM: PORTABLE CHEST - 1 VIEW  COMPARISON:  08/14/2012  FINDINGS: Cardiac shadow remains enlarged. Postsurgical changes are again noted. Increased vascular congestion and pulmonary edema is noted. No focal infiltrate is seen.  IMPRESSION: Severe congestive failure with pulmonary edema.   Electronically Signed   By: Alcide Clever M.D.   On: 01/08/2013 15:19   Dg Shoulder Left Port  01/09/2013   *RADIOLOGY REPORT*  Clinical Data: Post fall 1 week ago now with persistent anterior shoulder pain, initial encounter.  PORTABLE LEFT SHOULDER - 2+ VIEW  Comparison: None.  Findings: Osteopenia.  No definite fracture or dislocation. Given obliquity, mild degenerative changes of the left glenohumeral joint is suspected with joint space loss, subchondral sclerosis and osteophytosis.  Suspect mild degenerative change of the [facility] joint. Potential minimal amount of calcific tendonitis.  Limited visualization of adjacent thorax is normal.  Regional soft tissues are normal.  IMPRESSION: 1.  Osteopenia without definite acute finding. 2.  Mild degenerative change of the glenohumeral and acromioclavicular joints with possible mild calcific tendonitis. Further evaluation with shoulder MRI may be performed as clinically indicated.   Original Report  Authenticated By: Tacey Ruiz, MD   2D echo 01/11/13 - Study data: Technically difficult study. - Left ventricle: The cavity size was mildly dilated. Wall thickness was normal. The estimated ejection fraction was in the range of 15% to 20%. This is significantly worst compared to prior study May 16,2012. The study is not technically sufficient to allow evaluation of LV diastolic function. Internal dimension: 63mm (ED, PLAX). - Aortic valve: Mildly calcified annulus. Mildly thickened leaflets. Uncertain number of leaflets. Valve area: 2.87cm^2(VTI). Valve area: 3.1cm^2 (Vmax). - Left atrium: The atrium was severely dilated. - Right ventricle: The cavity size was mildly to moderately dilated. Systolic function was moderately reduced. RV TAPSE is 1.2 cm. - Right atrium: The atrium was moderately dilated. - Pulmonary arteries: Systolic pressure was moderately increased. PA peak pressure: 60mm Hg (S).   Discharge Medications     Medication List    STOP taking these medications       KLOR-CON M20 20 MEQ tablet  Generic drug:  potassium chloride SA      TAKE these medications       aspirin 81 MG tablet  Take 1 tablet (81 mg total) by mouth daily.     atorvastatin 40 MG tablet  Commonly known as:  LIPITOR  Take 1 tablet (40 mg total) by mouth every evening.     carvedilol 6.25 MG tablet  Commonly known as:  COREG  Take 1 tablet (6.25 mg total) by mouth 2 (two) times daily with a meal.     furosemide 40 MG tablet  Commonly known as:  LASIX  Take 1 tablet (40 mg total) by mouth daily.     insulin NPH 100 UNIT/ML injection  Commonly known as:  HUMULIN N,NOVOLIN N  Inject 110 Units into the skin 2 (two) times daily.     lisinopril 2.5 MG tablet  Commonly known as:  PRINIVIL,ZESTRIL  Take 1 tablet (2.5 mg total) by mouth daily.     nitroGLYCERIN 0.4 MG SL tablet  Commonly known as:  NITROSTAT  Place 0.4 mg under the tongue every 5 (five) minutes as needed for chest  pain.        Disposition   The patient will be discharged in stable condition to home. Discharge Orders   Future Appointments Provider Department Dept Phone   01/26/2013 1:10 PM Jodelle Gross, NP [facility] [phone]   Future Orders Complete By Expires   Diet - low sodium heart healthy  As directed    Discharge instructions  As directed    Comments:     It is EXTREMELY IMPORTANT that you follow up with your primary care doctor for your uncontrolled diabetes. Diabetes can easily lead to serious consequences such as  heart attack, stroke, peripheral vascular disease including amputated limbs, kidney failure requiring 4-hour dialysis treatments 3 times a week, blindness, chronic pain from nerve damage, and early death. Please call today to make that appointment.   For patients with congestive heart failure, we give them these special instructions:  1. Follow a low-salt diet and watch your fluid intake. In general, you should not be taking in more than 2 liters of fluid per day (no more than 8 glasses per day). Some patients are restricted to less than 1.5 liters of fluid per day (no more than 6 glasses per day). This includes sources of water in foods like soup, coffee, tea, milk, etc. 2. Weigh yourself on the same scale at same time of day and keep a log. 3. Call your doctor: (Anytime you feel any of the following symptoms)  - 3-4 pound weight gain in 1-2 days or 2 pounds overnight  - Shortness of breath, with or without a dry hacking cough  - Swelling in the hands, feet or stomach  - If you have to sleep on extra pillows at night in order to breathe  IT IS IMPORTANT TO LET YOUR DOCTOR KNOW EARLY ON IF YOU ARE HAVING SYMPTOMS SO WE CAN HELP YOU!   Increase activity slowly  As directed    Scheduling Instructions:     No driving for 2 days. No lifting over 5 lbs for 5 days. No sexual activity for 5 days. Keep procedure site clean & dry. If you notice increased pain,  swelling, bleeding or pus, call/return!  You may shower, but no soaking baths/hot tubs/pools for 1 week.     Follow-up Information   Follow up with Joni Reining, NP. [facility] [place] office - 01/26/13 at 1:10pm.)    Specialty:  Nurse Practitioner   Contact information:   [address] [place] [address] [phone]       Follow up with [facility]. (RN)    Contact information:   [address] [place] [address] [phone]       Follow up with HAWKINS,EDWARD L, MD. (See discharge instructions below.)    Specialty:  Pulmonary Disease   Contact information:   [address] [place] [address] [phone]         Duration of Discharge Encounter: Greater than 30 minutes including physician and PA time.  Signed, Ronie Spies PA-C 01/18/2013, 10:34 AM  Attending Note:   The patient was seen and examined.  Agree with assessment and plan as noted above.  Changes made to the above note as needed.  As noted in my note, the cath from Friday did not show any significant new problems that the patients son did not already know.    He needs to avoid salt - I think this is a major issue with him. Have started him on low dose coreg and low dose lisiinopril - his son said that he gets hypotensive .  Will try to avoid volume depletion.   He will follow up with Dr. Wyline Mood .  Vesta Mixer, Montez Hageman., MD, [facility] 01/18/2013, 11:53 AM

## 2013-01-18 NOTE — Progress Notes (Signed)
Patient: Timothy Trujillo / Admit Date: 01/08/2013 / Date of Encounter: 01/18/2013, 8:55 AM   Subjective  Timothy Trujillo is a 62 yo with hx of CAD, s/p CABG who was admitted at [facility] and then transferred to [facility] for further evaluation of his CP.     He had a cath last Friday which revealed moderate disease in the LAD.   This distal LCx and prox RCA were occluded.  The 2 SVGs were occludes.   He has had significant renal insufficiency and so LV gram was not done.  I was able to talk to his son on Friday.  The cath findings are not new.  He apparently had a cath 3 years ago which found similar results.  He has been completely noncompliant with his low salt diet - he eats everything that he wants.    He has continued to improved over the weekend.  His regular cardiologist is in [place], [place].   He cannot go to [place] anymore because it is out of his network.  He will be following up in [place] or [place].    Objective   Telemetry: NSR  Physical Exam: Blood pressure 132/80, pulse 102, temperature 97.5 F (36.4 C), temperature source Oral, resp. rate 22, height 6\' 3"  (1.905 m), weight 276 lb 7.3 oz (125.4 kg), SpO2 99.00%. General: Well developed, obese WM in no acute distress. Laying flat in bed without resp distress. Head: Normocephalic, atraumatic, sclera non-icteric, no xanthomas, nares are without discharge. Neck: JVP not elevated. Lungs: Clear bilaterally to auscultation without wheezes, rales, or rhonchi. Breathing is unlabored. Heart: RRR S1 S2 without murmurs, rubs, or gallops.  Abdomen: Soft, non-tender, non-distended with normoactive bowel sounds. No rebound/guarding. Extremities: No clubbing or cyanosis. No edema. Distal pedal pulses are 2+ and equal bilaterally. Neuro: Alert and oriented X 3. Moves all extremities spontaneously. Psych:  Responds to questions appropriately with a normal affect.   Intake/Output Summary (Last 24 hours) at 01/18/13 0855 Last data filed at 01/17/13 2108  Gross per 24 hour  Intake    900 ml  Output   2550 ml  Net  -1650 ml    Inpatient Medications:  . aspirin  81 mg Oral Daily  . atorvastatin  40 mg Oral q1800  . carvedilol  6.25 mg Oral BID WC  . insulin aspart  0-20 Units Subcutaneous TID WC  . insulin aspart  0-5 Units Subcutaneous QHS  . insulin aspart  9 Units Subcutaneous TID WC  . Living Better with Heart Failure Book   Does not apply Once  . sodium chloride  3 mL Intravenous Q12H   Infusions:  . sodium chloride 125 mL/hr at 01/15/13 1600    Labs:  Recent Labs  01/17/13 0514 01/18/13 0500  NA 136 133*  K 4.6 4.1  CL 98 96  CO2 27 25  GLUCOSE 219* 229*  BUN 27* 25*  CREATININE 1.07 0.95  CALCIUM 9.8 9.5    Recent Labs  01/17/13 0514 01/18/13 0500  WBC 8.3 8.9  HGB 14.0 14.0  HCT 39.2 39.9  MCV 88.1 87.7  PLT 257 252    Radiology/Studies:  Dg Chest Port 1 View  01/09/2013   CLINICAL DATA:  Followup congestive heart failure.  EXAM: PORTABLE CHEST - 1 VIEW  COMPARISON:  01/08/2013  FINDINGS: Lungs are somewhat hypoinflated with continued hazy perihilar opacification likely interstitial edema although cannot exclude infection. There has been mild interval improvement in this airspace process with slightly better aeration. Subtle  blunting of the right costophrenic angle likely a small amount of right pleural fluid. Mild stable cardiomegaly. Remainder of the exam is unchanged.  IMPRESSION: Interval improvement in mild hazy perihilar opacification likely improving interstitial edema, although cannot exclude infection. Possible small amount of right pleural fluid.  Stable cardiomegaly.   Electronically Signed   By: Elberta Fortis M.D.   On: 01/09/2013 08:52   Dg Chest Portable 1 View  01/08/2013   CLINICAL DATA:  Shortness of breath  EXAM: PORTABLE CHEST - 1 VIEW  COMPARISON:  08/14/2012  FINDINGS: Cardiac shadow remains enlarged. Postsurgical changes are again noted. Increased vascular congestion and pulmonary edema  is noted. No focal infiltrate is seen.  IMPRESSION: Severe congestive failure with pulmonary edema.   Electronically Signed   By: Alcide Clever M.D.   On: 01/08/2013 15:19   Dg Shoulder Left Port  01/09/2013   *RADIOLOGY REPORT*  Clinical Data: Post fall 1 week ago now with persistent anterior shoulder pain, initial encounter.  PORTABLE LEFT SHOULDER - 2+ VIEW  Comparison: None.  Findings: Osteopenia.  No definite fracture or dislocation. Given obliquity, mild degenerative changes of the left glenohumeral joint is suspected with joint space loss, subchondral sclerosis and osteophytosis.  Suspect mild degenerative change of the [facility] joint. Potential minimal amount of calcific tendonitis.  Limited visualization of adjacent thorax is normal.  Regional soft tissues are normal.  IMPRESSION: 1.  Osteopenia without definite acute finding. 2.  Mild degenerative change of the glenohumeral and acromioclavicular joints with possible mild calcific tendonitis. Further evaluation with shoulder MRI may be performed as clinically indicated.   Original Report Authenticated By: Tacey Ruiz, MD     Assessment and Plan  1.   history of CAD s/p prior CABG/PCI:  His cath revealed severe CAD but not amenable to PCI or redo CABG.  Will continue with medical therapy.  Continue coreg.    2. Acute on chronic CHF with worsening ICM (EF 15-20% by echo 10/6)- on Coreg.  Will try to start Lisinopril 2.5 a day - his renal function has improved.  He eats lots of salt on a regular basis.  I have counseled him about this.    3. HTN:  He is very sensitive to BP meds since he lost 200 lbs over the past several years.   4. Acute renal failure with oliguria, likely due to overdiuresis:  His renal function has improved.   5. IDDM, uncontrolled, A1C 12.8  He may be able to be discharged today if he is able to ambulate without difficulty.  He is on home O2. DC meds:  Home O2 ASA 81 Atorvastatin 40 Coreg 6.25 bid Lisinopril 2. 5  daily Lasix 40 mg a day  ( a reduction of his home dose) Insulin - per home dose.  Will follow up with Dr. Wyline Mood.    Vesta Mixer, Montez Hageman., MD, [facility] 01/18/2013, 8:55 AM Office - [phone] Pager [phone]

## 2013-01-19 NOTE — Telephone Encounter (Signed)
Patient contacted regarding discharge from [facility] on  01-18-13.  Patient understands to follow up with provider Joni Reining, NP on 01-26-13 at 1:10 pm at [facility] office. Patient understands discharge instructions? yes Patient understands medications and regiment? yes Patient understands to bring all medications to this visit? yes  Pt states he might have to reschedule due to someone else has to take him. Pt will call the office if needs to be rescheduled.

## 2013-01-26 ENCOUNTER — Encounter: Payer: Self-pay | Admitting: Adult Health

## 2013-01-26 ENCOUNTER — Ambulatory Visit (INDEPENDENT_AMBULATORY_CARE_PROVIDER_SITE_OTHER): Payer: Medicare Other | Admitting: Adult Health

## 2013-01-26 VITALS — BP 112/75 | HR 96 | Ht 75.0 in | Wt 281.0 lb

## 2013-01-26 DIAGNOSIS — E669 Obesity, unspecified: Secondary | ICD-10-CM

## 2013-01-26 DIAGNOSIS — I10 Essential (primary) hypertension: Secondary | ICD-10-CM

## 2013-01-26 DIAGNOSIS — E785 Hyperlipidemia, unspecified: Secondary | ICD-10-CM

## 2013-01-26 DIAGNOSIS — I5023 Acute on chronic systolic (congestive) heart failure: Secondary | ICD-10-CM

## 2013-01-26 MED ORDER — FUROSEMIDE 40 MG PO TABS: 80.0000 mg | ORAL_TABLET | Freq: Every day | ORAL | Status: DC

## 2013-01-26 NOTE — Assessment & Plan Note (Signed)
Very sedentary uses wheel chair for ambulation,

## 2013-01-26 NOTE — Progress Notes (Signed)
HPI: [name] is a 62 year old patient of Dr. Christiane Ha branch we are following status was hospitalization in the setting of unstable angina with known history of CAD, prior CABG with PCI, status post cardiac catheterization on 01/15/2013 revealing moderate disease in the LAD, distal circumflex, proximal RCA was occluded, 2 SVGs occluded felt to be stable from prior cath. Medical therapy was recommended. Patient also had an episode of acute on chronic CHF with worsening ischemic cardiomyopathy EF of 15-20% by echocardiogram on 01/11/2013. Other history includes diabetes hyperlipidemia CAD, hypertension. The patient was admitted with respiratory failure and managed on BiPAP he was diuresis in the setting of pulmonary edema. The patient diuresis 19 L during hospitalization. Discharge weight was 276 pounds. The patient was discharged on carvedilol 6.25 mg twice a day, furosemide 40 mg daily, lisinopril 2.5 mg daily and aspirin daily.   He comes today having gained 5 lbs, with some increased shortness of breath. He is eating most of his meals out of a can. His son takes him to the grocery store as he cannot drive. He has spoken to Dr. Juanetta Gosling who has increased his lasix to 80 mg daily as he was taking at home, from 40 mg daily. He is yet to start this dose.     Allergies  Allergen Reactions  . Stadol [Butorphanol Tartrate] Other (See Comments)    "makes him crazy"    Current Outpatient Prescriptions  Medication Sig Dispense Refill  . aspirin 81 MG tablet Take 1 tablet (81 mg total) by mouth daily.      [name] atorvastatin (LIPITOR) 40 MG tablet Take 1 tablet (40 mg total) by mouth every evening.  30 tablet  6  . carvedilol (COREG) 6.25 MG tablet Take 1 tablet (6.25 mg total) by mouth 2 (two) times daily with a meal.  60 tablet  6  . cyclobenzaprine (FLEXERIL) 5 MG tablet Take 5 mg by mouth 3 (three) times daily as needed for muscle spasms.      . furosemide (LASIX) 40 MG tablet Take 2 tablets (80 mg  total) by mouth daily.  60 tablet  6  . insulin NPH (HUMULIN N,NOVOLIN N) 100 UNIT/ML injection Inject 110 Units into the skin 2 (two) times daily.      [name] lisinopril (PRINIVIL,ZESTRIL) 2.5 MG tablet Take 1 tablet (2.5 mg total) by mouth daily.  30 tablet  6  . nitroGLYCERIN (NITROSTAT) 0.4 MG SL tablet Place 0.4 mg under the tongue every 5 (five) minutes as needed for chest pain.       [name] oxyCODONE-acetaminophen (PERCOCET/ROXICET) 5-325 MG per tablet Take 1 tablet by mouth every 6 (six) hours as needed for pain.      [name] KLOR-CON M20 20 MEQ tablet Take 20 mEq by mouth daily.        No current facility-administered medications for this visit.    Past Medical History  Diagnosis Date  . Uncontrolled diabetes mellitus     a. A1C 12.8 in 01/2013.  [name] Cerebral artery occlusion   . Chronic systolic heart failure   . Obesity   . Hyperlipidemia   . Peripheral vascular disease   . Hypertension   . Asthma   . CAD (coronary artery disease)     a. Prior CABG 20 yrs ago (?~1994). b. Hx of multiple stents, prev followed in [place]. c. [place] 01/2013: cath moderate disease in LAD, distal LCx, prox RCA occluded, 2 SVGs occluded, felt to be stable from prior cath -> for  medical therapy.  . Ischemic cardiomyopathy     a. EF 2012: 55-60. b. EF 12/2011: 25-30%. c. Echo 01/2013: EF 15-20%.  . Stroke   . Arthritis   . Anxiety   . Pneumonia   . Depression   . 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 Chronic respiratory failure     a. Due to COPD.    Past Surgical History  Procedure Laterality Date  . Coronary artery bypass graft    . Coronary stent placement  2008    CABG grafts closed  . Joint replacement    . Cholecystectomy    . Eye surgery    . I&d extremity  06/12/2011    Procedure: IRRIGATION AND DEBRIDEMENT EXTREMITY;  Surgeon: Javier Docker, MD;  Location: [facility] OR;  Service: Orthopedics;  Laterality: Right;  I&D Right [place]    ROS: Review of systems complete and  found to be negative unless listed above  PHYSICAL EXAM BP 112/75  Pulse 96  Ht 6\' 3"  (1.905 m)  Wt 281 lb (127.461 kg)  BMI 35.12 kg/m2  General: Well developed, well nourished, in no acute distress Head: Eyes PERRLA,  Positive for  xanthomas.   Normal cephalic and atramatic  Lungs: Right lower lobe crackles without wheezes or coughing. Heart: HRRR S1 S2, without MRG.  Pulses are 2+ & equal.            No carotid bruit. No JVD.  No abdominal bruits. No femoral bruits. Abdomen: Bowel sounds are positive, abdomen soft and non-tender without masses or                  Hernia's noted. Msk:  Back normal, riding motorized wheelchair.. Normal strength and tone for age. Extremities: No clubbing, cyanosis or edema.  DP +1 Neuro: Alert and oriented X 3. Psych:  Good affect, responds appropriately    ASSESSMENT AND PLAN

## 2013-01-26 NOTE — Progress Notes (Deleted)
Name: Timothy Trujillo    DOB: [birth year]  Age: 62 y.o.  MR#: [id number]       PCP:  Fredirick Maudlin, MD      Insurance: Payor: [employer] [place] [employer] / Plan: BLUE MEDICARE / Product Type: *No Product type* /   CC:    Chief Complaint  Patient presents with  . Coronary Artery Disease    S/P CABG  . Congestive Heart Failure    Systolic    VS Filed Vitals:   01/26/13 1306  BP: 112/75  Pulse: 96  Height: 6\' 3"  (1.905 m)  Weight: 281 lb (127.461 kg)    Weights Current Weight  01/26/13 281 lb (127.461 kg)  01/18/13 276 lb 7.3 oz (125.4 kg)  01/18/13 276 lb 7.3 oz (125.4 kg)    Blood Pressure  BP Readings from Last 3 Encounters:  01/26/13 112/75  01/18/13 132/80  01/18/13 132/80     Admit date:  (Not on file) Last encounter with RMR:  Visit date not found   Allergy Stadol  Current Outpatient Prescriptions  Medication Sig Dispense Refill  . aspirin 81 MG tablet Take 1 tablet (81 mg total) by mouth daily.      [name] atorvastatin (LIPITOR) 40 MG tablet Take 1 tablet (40 mg total) by mouth every evening.  30 tablet  6  . carvedilol (COREG) 6.25 MG tablet Take 1 tablet (6.25 mg total) by mouth 2 (two) times daily with a meal.  60 tablet  6  . cyclobenzaprine (FLEXERIL) 5 MG tablet Take 5 mg by mouth 3 (three) times daily as needed for muscle spasms.      . furosemide (LASIX) 40 MG tablet Take 1 tablet (40 mg total) by mouth daily.  30 tablet  6  . insulin NPH (HUMULIN N,NOVOLIN N) 100 UNIT/ML injection Inject 110 Units into the skin 2 (two) times daily.      [name] lisinopril (PRINIVIL,ZESTRIL) 2.5 MG tablet Take 1 tablet (2.5 mg total) by mouth daily.  30 tablet  6  . nitroGLYCERIN (NITROSTAT) 0.4 MG SL tablet Place 0.4 mg under the tongue every 5 (five) minutes as needed for chest pain.       [name] oxyCODONE-acetaminophen (PERCOCET/ROXICET) 5-325 MG per tablet Take 1 tablet by mouth every 6 (six) hours as needed for pain.      [name] KLOR-CON M20 20 MEQ tablet Take 20 mEq by mouth  daily.        No current facility-administered medications for this visit.    Discontinued Meds:   There are no discontinued medications.  Patient Active Problem List   Diagnosis Date Noted  . Acute on chronic systolic heart failure 01/16/2013  . Ischemic cardiomyopathy 01/16/2013  . Sinus tachycardia 01/16/2013  . COPD, severe  O2 dependent 01/16/2013  . Cerebrovascular disease 01/08/2013  . Diabetes mellitus 06/12/2011  . Hypertension 06/12/2011  . Hyperlipidemia 06/12/2011  . Silicosis 06/12/2011  . Osteomyelitis of knee region 06/12/2011  . Cerebral artery occlusion   . Obesity   . Peripheral vascular disease     LABS    Component Value Date/Time   NA 133* 01/18/2013 0500   NA 136 01/17/2013 0514   NA 134* 01/15/2013 0800   K 4.1 01/18/2013 0500   K 4.6 01/17/2013 0514   K 4.7 01/15/2013 0800   CL 96 01/18/2013 0500   CL 98 01/17/2013 0514   CL 97 01/15/2013 0800   CO2 25 01/18/2013 0500   CO2 27  01/17/2013 0514   CO2 26 01/15/2013 0800   GLUCOSE 229* 01/18/2013 0500   GLUCOSE 219* 01/17/2013 0514   GLUCOSE 164* 01/15/2013 0800   BUN 25* 01/18/2013 0500   BUN 27* 01/17/2013 0514   BUN 47* 01/15/2013 0800   CREATININE 0.95 01/18/2013 0500   CREATININE 1.07 01/17/2013 0514   CREATININE 1.40* 01/15/2013 0800   CREATININE 1.29 07/19/2011 1207   CALCIUM 9.5 01/18/2013 0500   CALCIUM 9.8 01/17/2013 0514   CALCIUM 9.6 01/15/2013 0800   GFRNONAA 86* 01/18/2013 0500   GFRNONAA 71* 01/17/2013 0514   GFRNONAA 52* 01/15/2013 0800   GFRAA >90 01/18/2013 0500   GFRAA 83* 01/17/2013 0514   GFRAA 60* 01/15/2013 0800   CMP     Component Value Date/Time   NA 133* 01/18/2013 0500   K 4.1 01/18/2013 0500   CL 96 01/18/2013 0500   CO2 25 01/18/2013 0500   GLUCOSE 229* 01/18/2013 0500   BUN 25* 01/18/2013 0500   CREATININE 0.95 01/18/2013 0500   CREATININE 1.29 07/19/2011 1207   CALCIUM 9.5 01/18/2013 0500   PROT 7.0 01/09/2013 0527   ALBUMIN 3.5 01/09/2013 0527    AST 11 01/09/2013 0527   ALT 8 01/09/2013 0527   ALKPHOS 83 01/09/2013 0527   BILITOT 0.5 01/09/2013 0527   GFRNONAA 86* 01/18/2013 0500   GFRAA >90 01/18/2013 0500       Component Value Date/Time   WBC 8.9 01/18/2013 0500   WBC 8.3 01/17/2013 0514   WBC 8.1 01/16/2013 0550   HGB 14.0 01/18/2013 0500   HGB 14.0 01/17/2013 0514   HGB 14.0 01/16/2013 0550   HCT 39.9 01/18/2013 0500   HCT 39.2 01/17/2013 0514   HCT 40.5 01/16/2013 0550   MCV 87.7 01/18/2013 0500   MCV 88.1 01/17/2013 0514   MCV 88.6 01/16/2013 0550    Lipid Panel     Component Value Date/Time   CHOL 245* 01/15/2013 0800   TRIG 340* 01/15/2013 0800   HDL 22* 01/15/2013 0800   CHOLHDL 11.1 01/15/2013 0800   VLDL 68* 01/15/2013 0800   LDLCALC 155* 01/15/2013 0800    ABG    Component Value Date/Time   PHART 7.336* 01/15/2013 1400   PCO2ART 45.6* 01/15/2013 1400   PO2ART 81.0 01/15/2013 1400   HCO3 24.4* 01/15/2013 1400   TCO2 26 01/15/2013 1400   ACIDBASEDEF 2.0 01/15/2013 1400   O2SAT 95.0 01/15/2013 1400     Lab Results  Component Value Date   TSH 2.076 01/08/2013   BNP (last 3 results)  Recent Labs  01/09/13 0527 01/11/13 0510 01/18/13 0500  PROBNP 1418.0* 1116.0* 1270.0*   Cardiac Panel (last 3 results) No results found for this basename: CKTOTAL, CKMB, TROPONINI, RELINDX,  in the last 72 hours  Iron/TIBC/Ferritin No results found for this basename: iron, tibc, ferritin     EKG Orders placed during the hospital encounter of 01/08/13  . EKG 12-LEAD  . EKG 12-LEAD  . EKG 12-LEAD  . EKG 12-LEAD  . ED EKG  . EKG 12-LEAD  . EKG 12-LEAD  . EKG     Prior Assessment and Plan Problem List as of 01/26/2013     Cardiovascular and Mediastinum   Cerebral artery occlusion   Peripheral vascular disease   Hypertension   Cerebrovascular disease   Acute on chronic systolic heart failure   Ischemic cardiomyopathy     Respiratory   Silicosis   COPD, severe  O2 dependent     Endocrine  Diabetes mellitus     Musculoskeletal and Integument   Osteomyelitis of knee region   Last Assessment & Plan   09/04/2011 Office Visit Edited 09/04/2011 12:01 PM by Ginnie Smart, MD     Somewhat limited with Cx (-). WIll cont his IV therapy as previously written and then transition him to bactrim. He has seen Dr Shelle Iron who also referred him to [facility]. Will see him back in 6 weeks and consider MRI at that time. He will call if he has worsening in the intervening period.      Other   Obesity   Hyperlipidemia   Sinus tachycardia       Imaging: Dg Chest 2 View  01/17/2013   CLINICAL DATA:  Shortness of breath.  EXAM: CHEST  2 VIEW  COMPARISON:  01/09/2013  FINDINGS: Patchy areas of interstitial and airspace densities are noted bilaterally, most evident in the mid lungs and bases. This may all reflect chronic fibrosis. Superimposed pneumonia or pneumonitis this is possible. The appearance is stable from the prior exam.  No pleural effusion. No pneumothorax.  Cardiac silhouette is mildly enlarged. Changes from CABG surgery are stable. The mediastinum is normal in contour.  IMPRESSION: 1. Findings are essentially stable from the most recent prior study allowing for differences in technique. 2. Patchy interstitial and airspace densities may all be chronic due to fibrosis. Superimposed infectious or inflammatory infiltrate should be considered in the proper clinical setting. 3. No convincing edema. No pleural effusion.   Electronically Signed   By: Amie Portland M.D.   On: 01/17/2013 09:38   Dg Chest Port 1 View  01/09/2013   CLINICAL DATA:  Followup congestive heart failure.  EXAM: PORTABLE CHEST - 1 VIEW  COMPARISON:  01/08/2013  FINDINGS: Lungs are somewhat hypoinflated with continued hazy perihilar opacification likely interstitial edema although cannot exclude infection. There has been mild interval improvement in this airspace process with slightly better aeration. Subtle blunting of the right  costophrenic angle likely a small amount of right pleural fluid. Mild stable cardiomegaly. Remainder of the exam is unchanged.  IMPRESSION: Interval improvement in mild hazy perihilar opacification likely improving interstitial edema, although cannot exclude infection. Possible small amount of right pleural fluid.  Stable cardiomegaly.   Electronically Signed   By: Elberta Fortis M.D.   On: 01/09/2013 08:52   Dg Chest Portable 1 View  01/08/2013   CLINICAL DATA:  Shortness of breath  EXAM: PORTABLE CHEST - 1 VIEW  COMPARISON:  08/14/2012  FINDINGS: Cardiac shadow remains enlarged. Postsurgical changes are again noted. Increased vascular congestion and pulmonary edema is noted. No focal infiltrate is seen.  IMPRESSION: Severe congestive failure with pulmonary edema.   Electronically Signed   By: Alcide Clever M.D.   On: 01/08/2013 15:19   Dg Shoulder Left Port  01/09/2013   *RADIOLOGY REPORT*  Clinical Data: Post fall 1 week ago now with persistent anterior shoulder pain, initial encounter.  PORTABLE LEFT SHOULDER - 2+ VIEW  Comparison: None.  Findings: Osteopenia.  No definite fracture or dislocation. Given obliquity, mild degenerative changes of the left glenohumeral joint is suspected with joint space loss, subchondral sclerosis and osteophytosis.  Suspect mild degenerative change of the [facility] joint. Potential minimal amount of calcific tendonitis.  Limited visualization of adjacent thorax is normal.  Regional soft tissues are normal.  IMPRESSION: 1.  Osteopenia without definite acute finding. 2.  Mild degenerative change of the glenohumeral and acromioclavicular joints with possible mild calcific tendonitis. Further evaluation  with shoulder MRI may be performed as clinically indicated.   Original Report Authenticated By: Tacey Ruiz, MD

## 2013-01-26 NOTE — Assessment & Plan Note (Signed)
Continue statin. Follow up labs in 3 months.

## 2013-01-26 NOTE — Patient Instructions (Signed)
Your physician recommends that you schedule a follow-up appointment in: 1 month with Dr Wyline Mood  Your physician has recommended you make the following change in your medication:  1. Increase Lasix to 80 mg daily  Your physician recommends that you return for lab work in two weeks. BMET  2 Gram Low Sodium Diet A 2 gram sodium diet restricts the amount of sodium in the diet to no more than 2 g or 2000 mg daily. Limiting the amount of sodium is often used to help lower blood pressure. It is important if you have heart, liver, or kidney problems. Many foods contain sodium for flavor and sometimes as a preservative. When the amount of sodium in a diet needs to be low, it is important to know what to look for when choosing foods and drinks. The following includes some information and guidelines to help make it easier for you to adapt to a low sodium diet. QUICK TIPS  Do not add salt to food.  Avoid convenience items and fast food.  Choose unsalted snack foods.  Buy lower sodium products, often labeled as "lower sodium" or "no salt added."  Check food labels to learn how much sodium is in 1 serving.  When eating at a restaurant, ask that your food be prepared with less salt or none, if possible. READING FOOD LABELS FOR SODIUM INFORMATION The nutrition facts label is a good place to find how much sodium is in foods. Look for products with no more than 500 to 600 mg of sodium per meal and no more than 150 mg per serving. Remember that 2 g = 2000 mg. The food label may also list foods as:  Sodium-free: Less than 5 mg in a serving.  Very low sodium: 35 mg or less in a serving.  Low-sodium: 140 mg or less in a serving.  Light in sodium: 50% less sodium in a serving. For example, if a food that usually has 300 mg of sodium is changed to become light in sodium, it will have 150 mg of sodium.  Reduced sodium: 25% less sodium in a serving. For example, if a food that usually has 400 mg of sodium is  changed to reduced sodium, it will have 300 mg of sodium. CHOOSING FOODS Grains  Avoid: Salted crackers and snack items. Some cereals, including instant hot cereals. Bread stuffing and biscuit mixes. Seasoned rice or pasta mixes.  Choose: Unsalted snack items. Low-sodium cereals, oats, puffed wheat and rice, shredded wheat. English muffins and bread. Pasta. Meats  Avoid: Salted, canned, smoked, spiced, pickled meats, including fish and poultry. Bacon, ham, sausage, cold cuts, hot dogs, anchovies.  Choose: Low-sodium canned tuna and salmon. Fresh or frozen meat, poultry, and fish. Dairy  Avoid: Processed cheese and spreads. Cottage cheese. Buttermilk and condensed milk. Regular cheese.  Choose: Milk. Low-sodium cottage cheese. Yogurt. Sour cream. Low-sodium cheese. Fruits and Vegetables  Avoid: Regular canned vegetables. Regular canned tomato sauce and paste. Frozen vegetables in sauces. Olives. Rosita Fire. Relishes. Sauerkraut.  Choose: Low-sodium canned vegetables. Low-sodium tomato sauce and paste. Frozen or fresh vegetables. Fresh and frozen fruit. Condiments  Avoid: Canned and packaged gravies. Worcestershire sauce. Tartar sauce. Barbecue sauce. Soy sauce. Steak sauce. Ketchup. Onion, garlic, and table salt. Meat flavorings and tenderizers.  Choose: Fresh and dried herbs and spices. Low-sodium varieties of mustard and ketchup. Lemon juice. Tabasco sauce. Horseradish. SAMPLE 2 GRAM SODIUM MEAL PLAN Breakfast / Sodium (mg)  1 cup low-fat milk / 143 mg  2 slices  whole-wheat toast / 270 mg  1 tbs heart-healthy margarine / 153 mg  1 hard-boiled egg / 139 mg  1 small orange / 0 mg Lunch / Sodium (mg)  1 cup raw carrots / 76 mg   cup hummus / 298 mg  1 cup low-fat milk / 143 mg   cup red grapes / 2 mg  1 whole-wheat pita bread / 356 mg Dinner / Sodium (mg)  1 cup whole-wheat pasta / 2 mg  1 cup low-sodium tomato sauce / 73 mg  3 oz lean ground beef / 57 mg  1  small side salad (1 cup raw spinach leaves,  cup cucumber,  cup yellow bell pepper) with 1 tsp olive oil and 1 tsp red wine vinegar / 25 mg Snack / Sodium (mg)  1 container low-fat vanilla yogurt / 107 mg  3 graham cracker squares / 127 mg Nutrient Analysis  Calories: 2033  Protein: 77 g  Carbohydrate: 282 g  Fat: 72 g  Sodium: 1971 mg Document Released: 03/25/2005 Document Revised: 06/17/2011 Document Reviewed: 06/26/2009 [employer] Patient Information 2014 [place], [place].

## 2013-01-26 NOTE — Assessment & Plan Note (Signed)
Low normal today.  I will not change his meds at this time. He will have follow up echo in 3 months to evaluate his LV fx. If no improvement, may need to consider ICD pacemaker. Non-compliance issues may prohibit. Will follow closely.

## 2013-01-26 NOTE — Assessment & Plan Note (Signed)
He has gained 5 lbs since discharge and has been eating salty foods again, to included canned foods and salted meats. He has gone up on lasix to 80 mg per Dr. Juanetta Gosling.  I will repeat his BMET in 2 weeks, and see on close follow up in one month. Due to see Dr.Branch. He has been informed on low sodium diet verbally and in is given low sodium diet in writing.

## 2013-02-24 ENCOUNTER — Encounter: Payer: Self-pay | Admitting: Orthopedic Surgery

## 2013-02-24 ENCOUNTER — Telehealth: Payer: Self-pay | Admitting: Orthopedic Surgery

## 2013-02-24 ENCOUNTER — Ambulatory Visit (INDEPENDENT_AMBULATORY_CARE_PROVIDER_SITE_OTHER): Payer: Medicare Other | Admitting: Orthopedic Surgery

## 2013-02-24 VITALS — BP 110/80 | Ht 74.0 in | Wt 276.0 lb

## 2013-02-24 DIAGNOSIS — M67919 Unspecified disorder of synovium and tendon, unspecified shoulder: Secondary | ICD-10-CM

## 2013-02-24 DIAGNOSIS — M75102 Unspecified rotator cuff tear or rupture of left shoulder, not specified as traumatic: Secondary | ICD-10-CM | POA: Insufficient documentation

## 2013-02-24 NOTE — Progress Notes (Signed)
Patient ID: Timothy Trujillo, male   DOB: September 18, 1950, 62 y.o.   MRN: [id number] Chief Complaint  Patient presents with  . Shoulder Pain    Left shoulder pain, no injury. Consult from Dr. Juanetta Gosling    HISTORY: This is a 62 year old male status post a stroke many years ago presents with shoulder pain on the left side after his shoulder was jerked when he fell out of his motorized chair. His date of injury was about a year ago his had pain and percent this pain is not relieved by oxycodone and is managed better by aspirin. Oxycodone 10 to cause him a significant amount of constipation. He complains of 8/10 intermittent pain is better with rest is worse with movement he has catching sensation and reports some numbness in the shoulder. Also having some neck stiffness but no neck pain.  Review of systems she reports negative findings of skin changes poor healing easy bleeding or allergies he does report shortness of breath chest pain weight loss chills fatigue tightness of the chest constipation frequency urgency numbness unsteady gait depression anxiety and temperature intolerance  Past Medical History  Diagnosis Date  . Uncontrolled diabetes mellitus     a. A1C 12.8 in 01/2013.  [name] Cerebral artery occlusion   . Chronic systolic heart failure   . Obesity   . Hyperlipidemia   . Peripheral vascular disease   . Hypertension   . Asthma   . CAD (coronary artery disease)     a. Prior CABG 20 yrs ago (?~1994). b. Hx of multiple stents, prev followed in [place]. c. [place] 01/2013: cath moderate disease in LAD, distal LCx, prox RCA occluded, 2 SVGs occluded, felt to be stable from prior cath -> for medical therapy.  . Ischemic cardiomyopathy     a. EF 2012: 55-60. b. EF 12/2011: 25-30%. c. Echo 01/2013: EF 15-20%.  . Stroke   . Arthritis   . Anxiety   . Pneumonia   . Depression   . 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 Chronic respiratory failure     a. Due to COPD.   Past  Surgical History  Procedure Laterality Date  . Coronary artery bypass graft    . Coronary stent placement  2008    CABG grafts closed  . Joint replacement    . Cholecystectomy    . Eye surgery    . I&d extremity  06/12/2011    Procedure: IRRIGATION AND DEBRIDEMENT EXTREMITY;  Surgeon: Javier Docker, MD;  Location: [facility] OR;  Service: Orthopedics;  Laterality: Right;  I&D Right Tibia    BP 110/80  Ht 6\' 2"  (1.88 m)  Wt 276 lb (125.193 kg)  BMI 35.42 kg/m2 He is sitting in his wheelchair a slightly obese he is oriented x3 his mood is flat his affect is flat. He walks very minimally normally and uses his chair for ambulation  He has 90 of active abduction 150 of passive abduction and forward elevation he has a normal drop test which indicates no full thickness cuff tear he has tenderness in the strap and deltoid. He has a positive impingement sign is no instability in the shoulder the skin is normal he has weakness of his rotator cuff his distal neurovascular function is intact he has no obvious are palpable lymph nodes in the cervical or axillary area  His x-ray shows inferior glenoid spur a.c. joint arthritis proximal humeral migration and decreased acromiohumeral head distance  Assessment:  Is not a surgical candidate because of his severe lung disease and other medical problems  Recommend subacromial injection and home physical therapy.    Subacromial Shoulder Injection Procedure Note  Pre-operative Diagnosis: left RC Syndrome  Post-operative Diagnosis: same  Indications: pain   Anesthesia: ethyl chloride   Procedure Details   Verbal consent was obtained for the procedure. The shoulder was prepped withalcohol and the skin was anesthetized. A 20 gauge needle was advanced into the subacromial space through posterior approach without difficulty  The space was then injected with 3 ml 1% lidocaine and 1 ml of depomedrol. The injection site was cleansed with isopropyl alcohol and a  dressing was applied.  Complications:  None; patient tolerated the procedure well.

## 2013-02-24 NOTE — Telephone Encounter (Signed)
No Notes

## 2013-02-24 NOTE — Patient Instructions (Addendum)
Home PT ordered Joint Injection  Care After  Refer to this sheet in the next few days. These instructions provide you with information on caring for yourself after you have had a joint injection. Your caregiver also may give you more specific instructions. Your treatment has been planned according to current medical practices, but problems sometimes occur. Call your caregiver if you have any problems or questions after your procedure.  After any type of joint injection, it is not uncommon to experience:  Soreness, swelling, or bruising around the injection site.  Mild numbness, tingling, or weakness around the injection site caused by the numbing medicine used before or with the injection. It also is possible to experience the following effects associated with the specific agent after injection:  Iodine-based contrast agents:  Allergic reaction (itching, hives, widespread redness, and swelling beyond the injection site).  Corticosteroids (These effects are rare.):  Allergic reaction.  Increased blood sugar levels (If you have diabetes and you notice that your blood sugar levels have increased, notify your caregiver).  Increased blood pressure levels.  Mood swings.  Hyaluronic acid in the use of viscosupplementation.  Temporary heat or redness.  Temporary rash and itching.  Increased fluid accumulation in the injected joint. These effects all should resolve within a day after your procedure.  HOME CARE INSTRUCTIONS  Limit yourself to light activity the day of your procedure. Avoid lifting heavy objects, bending, stooping, or twisting.  Take prescription or over-the-counter pain medication as directed by your caregiver.  You may apply ice to your injection site to reduce pain and swelling the day of your procedure. Ice may be applied 3-4 times:  Put ice in a plastic bag.  Place a towel between your skin and the bag.  Leave the ice on for no longer than 15-20 minutes each time. SEEK IMMEDIATE  MEDICAL CARE IF:  Pain and swelling get worse rather than better or extend beyond the injection site.  Numbness does not go away.  Blood or fluid continues to leak from the injection site.  You have chest pain.  You have swelling of your face or tongue.  You have trouble breathing or you become dizzy.  You develop a fever, chills, or severe tenderness at the injection site that last longer than 1 day. MAKE SURE YOU:  Understand these instructions.  Watch your condition.  Get help right away if you are not doing well or if you get worse. Document Released: 12/06/2010 Document Revised: 06/17/2011 Document Reviewed: 12/06/2010  [facility] Patient Information 2014 [place], [place].  Rotator Cuff Tendonitis  The rotator cuff is the collection of all the muscles and tendons (the supraspinatus, infraspinatus, subscapularis, and teres minor muscles and their tendons) that help your shoulder stay in place. This unit holds the head of the upper arm bone (humerus) in the cup (fossa) of the shoulder blade (scapula). Basically, it connects the arm to the shoulder. Tendinitis is a swelling and irritation of the tissue, called cord like structures (tendons) that connect muscle to bone. It usually is caused by overusing the joint involved. When the tissue surrounding a tendon (the synovium) becomes inflamed, it is called tenosynovitis. This also is often the result of overuse in people whose jobs require repetitive (over and over again) types of motion.

## 2013-03-01 ENCOUNTER — Telehealth: Payer: Self-pay | Admitting: *Deleted

## 2013-03-01 NOTE — Telephone Encounter (Signed)
I called [facility] to follow up on the referral I sent to them on Thursday 02/25/13, and they informed me that they received the fax. Lucky, with [facility] said they called today 03/01/13 to set the patient up for his therapy today, and he refused it. He stated to them he could do his own exercises.

## 2013-03-03 ENCOUNTER — Encounter: Payer: Medicare Other | Admitting: Cardiology

## 2013-03-03 ENCOUNTER — Encounter: Payer: Self-pay | Admitting: Cardiology

## 2013-03-03 NOTE — Progress Notes (Signed)
Clinical Summary Mr. Vidrio is a 62 y.o.male  1. Chronic systolic heart failure - recent admission with acute on chronic systolic heart failure  2. CAD/ICM - prior CABG and stenting - LVEF Oct 2014 15-20%, moderately reduced RV function.  - patient with chest pain during prior admission 01/08/13 in the setting of decompensated heart failure, concerning for unstable angina - referred for cath as described below, occluded SVG to RCA and SVG to OM, recds were for medical management, anatomy not candidate for revascularization.  - diuresed 19 liters, discharge weight was 276 lbs.     3. HTN   4. Hyperlipidemia  Past Medical History  Diagnosis Date  . Uncontrolled diabetes mellitus     a. A1C 12.8 in 01/2013.  [name] Cerebral artery occlusion   . Chronic systolic heart failure   . Obesity   . Hyperlipidemia   . Peripheral vascular disease   . Hypertension   . Asthma   . CAD (coronary artery disease)     a. Prior CABG 20 yrs ago (?~1994). b. Hx of multiple stents, prev followed in [place]. c. [place] 01/2013: cath moderate disease in LAD, distal LCx, prox RCA occluded, 2 SVGs occluded, felt to be stable from prior cath -> for medical therapy.  . Ischemic cardiomyopathy     a. EF 2012: 55-60. b. EF 12/2011: 25-30%. c. Echo 01/2013: EF 15-20%.  . Stroke   . Arthritis   . Anxiety   . Pneumonia   . Depression   . 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 Chronic respiratory failure     a. Due to COPD.     Allergies  Allergen Reactions  . Stadol [Butorphanol Tartrate] Other (See Comments)    "makes him crazy"     Current Outpatient Prescriptions  Medication Sig Dispense Refill  . aspirin 81 MG tablet Take 1 tablet (81 mg total) by mouth daily.      [name] atorvastatin (LIPITOR) 40 MG tablet Take 1 tablet (40 mg total) by mouth every evening.  30 tablet  6  . carvedilol (COREG) 6.25 MG tablet Take 1 tablet (6.25 mg total) by mouth 2 (two) times daily  with a meal.  60 tablet  6  . cyclobenzaprine (FLEXERIL) 5 MG tablet Take 5 mg by mouth 3 (three) times daily as needed for muscle spasms.      . furosemide (LASIX) 40 MG tablet Take 2 tablets (80 mg total) by mouth daily.  60 tablet  6  . insulin NPH (HUMULIN N,NOVOLIN N) 100 UNIT/ML injection Inject 110 Units into the skin 2 (two) times daily.      [name] KLOR-CON M20 20 MEQ tablet Take 20 mEq by mouth daily.       [name] lisinopril (PRINIVIL,ZESTRIL) 2.5 MG tablet Take 1 tablet (2.5 mg total) by mouth daily.  30 tablet  6  . nitroGLYCERIN (NITROSTAT) 0.4 MG SL tablet Place 0.4 mg under the tongue every 5 (five) minutes as needed for chest pain.       [name] oxyCODONE-acetaminophen (PERCOCET/ROXICET) 5-325 MG per tablet Take 1 tablet by mouth every 6 (six) hours as needed for pain.       No current facility-administered medications for this visit.     Past Surgical History  Procedure Laterality Date  . Coronary artery bypass graft    . Coronary stent placement  2008    CABG grafts closed  . Joint replacement    .  Cholecystectomy    . Eye surgery    . I&d extremity  06/12/2011    Procedure: IRRIGATION AND DEBRIDEMENT EXTREMITY;  Surgeon: Javier Docker, MD;  Location: [facility] OR;  Service: Orthopedics;  Laterality: Right;  I&D Right Tibia     Allergies  Allergen Reactions  . Stadol [Butorphanol Tartrate] Other (See Comments)    "makes him crazy"      Family History  Problem Relation Age of Onset  . Dementia Mother   . Arthritis Mother   . Heart disease Father   . Heart disease Brother      Social History Mr. Massman reports that he quit smoking about 29 years ago. He has never used smokeless tobacco. Mr. Peschke reports that he does not drink alcohol.   Review of Systems CONSTITUTIONAL: No weight loss, fever, chills, weakness or fatigue.  HEENT: Eyes: No visual loss, blurred vision, double vision or yellow sclerae.No hearing loss, sneezing, congestion, runny nose or sore throat.  SKIN:  No rash or itching.  CARDIOVASCULAR:  RESPIRATORY: No shortness of breath, cough or sputum.  GASTROINTESTINAL: No anorexia, nausea, vomiting or diarrhea. No abdominal pain or blood.  GENITOURINARY: No burning on urination, no polyuria NEUROLOGICAL: No headache, dizziness, syncope, paralysis, ataxia, numbness or tingling in the extremities. No change in bowel or bladder control.  MUSCULOSKELETAL: No muscle, back pain, joint pain or stiffness.  LYMPHATICS: No enlarged nodes. No history of splenectomy.  PSYCHIATRIC: No history of depression or anxiety.  ENDOCRINOLOGIC: No reports of sweating, cold or heat intolerance. No polyuria or polydipsia.  [name]   Physical Examination There were no vitals filed for this visit. There were no vitals filed for this visit.  Gen: resting comfortably, no acute distress HEENT: no scleral icterus, pupils equal round and reactive, no palptable cervical adenopathy,  CV Resp: Clear to auscultation bilaterally GI: abdomen is soft, non-tender, non-distended, normal bowel sounds, no hepatosplenomegaly MSK: extremities are warm, no edema.  Skin: warm, no rash Neuro:  no focal deficits Psych: appropriate affect   Diagnostic Studies 01/08/13 Cath Hemodynamics:  RA: 10/0/8  RV: 42/6  PCWP: 13/14/13  PA: 45/22 (30)  Cardiac Output  Thermodilution: 4.63 Index 1.82  Fick : 5.22 Index 2.06  Arterial Sat: 95%  PA Sat: 60%.  LV pressure: 110//21  Aortic pressure: 110/66  Angiography  Left Main: 60% ostial LM, moderate irreg  Left anterior Descending: diffusely diseased, 20-40% throughout, 60% mid stenosis just after the large 1st diag  Left Circumflex: proximal stent, patent with small amount of ISR, The distal LCX is occluded after giving off a moderate sized OM  Right Coronary Artery: occluded prox  SVG to RCA: Occluded  SVG to ? OM ( vs. LAD) : Occluded prox.  There are no clips to suggest that he has a LIMA. There is no competitive flow in the LAD to  suggest a graft.  LV Gram: not performed due to the risk of toxic contrast nephropathy  Complications: No apparent complications  Patient did tolerate procedure well.  Contrast used: 45 cc   Conclusions:  1. CAD: The LM is ~ 60% . The LAD has mild - moderate irregularities. The distal LCx is occluded and the prox RCA is occluded. Both SVGs are occluded  2. Poor LV function by echo. LVG was not done in order to save contrast.  Given his risk factors ( poorly controlled DM, CHF) I think medical management is indicated.     Assessment and Plan  Arnoldo Lenis, M.D., F.A.C.C.

## 2013-04-09 ENCOUNTER — Encounter: Payer: Self-pay | Admitting: *Deleted

## 2013-04-19 ENCOUNTER — Inpatient Hospital Stay (HOSPITAL_COMMUNITY): Payer: Medicare HMO

## 2013-04-19 ENCOUNTER — Observation Stay (HOSPITAL_COMMUNITY)
Admission: EM | Admit: 2013-04-19 | Discharge: 2013-04-22 | Disposition: A | Discharge status: Home Health | Payer: Medicare HMO | Attending: Pulmonary Disease | Admitting: Pulmonary Disease

## 2013-04-19 ENCOUNTER — Encounter (HOSPITAL_COMMUNITY): Payer: Self-pay | Admitting: Emergency Medicine

## 2013-04-19 ENCOUNTER — Emergency Department (HOSPITAL_COMMUNITY): Payer: Medicare HMO

## 2013-04-19 DIAGNOSIS — I200 Unstable angina: Secondary | ICD-10-CM

## 2013-04-19 DIAGNOSIS — I5022 Chronic systolic (congestive) heart failure: Principal | ICD-10-CM | POA: Insufficient documentation

## 2013-04-19 DIAGNOSIS — I739 Peripheral vascular disease, unspecified: Secondary | ICD-10-CM

## 2013-04-19 DIAGNOSIS — J628 Pneumoconiosis due to other dust containing silica: Secondary | ICD-10-CM

## 2013-04-19 DIAGNOSIS — Z8673 Personal history of transient ischemic attack (TIA), and cerebral infarction without residual deficits: Secondary | ICD-10-CM | POA: Insufficient documentation

## 2013-04-19 DIAGNOSIS — I509 Heart failure, unspecified: Secondary | ICD-10-CM | POA: Insufficient documentation

## 2013-04-19 DIAGNOSIS — J449 Chronic obstructive pulmonary disease, unspecified: Secondary | ICD-10-CM | POA: Insufficient documentation

## 2013-04-19 DIAGNOSIS — R000 Tachycardia, unspecified: Secondary | ICD-10-CM

## 2013-04-19 DIAGNOSIS — I255 Ischemic cardiomyopathy: Secondary | ICD-10-CM

## 2013-04-19 DIAGNOSIS — I2510 Atherosclerotic heart disease of native coronary artery without angina pectoris: Secondary | ICD-10-CM | POA: Insufficient documentation

## 2013-04-19 DIAGNOSIS — R519 Headache, unspecified: Secondary | ICD-10-CM

## 2013-04-19 DIAGNOSIS — I25810 Atherosclerosis of coronary artery bypass graft(s) without angina pectoris: Secondary | ICD-10-CM

## 2013-04-19 DIAGNOSIS — E669 Obesity, unspecified: Secondary | ICD-10-CM | POA: Insufficient documentation

## 2013-04-19 DIAGNOSIS — Z9981 Dependence on supplemental oxygen: Secondary | ICD-10-CM | POA: Insufficient documentation

## 2013-04-19 DIAGNOSIS — E785 Hyperlipidemia, unspecified: Secondary | ICD-10-CM | POA: Insufficient documentation

## 2013-04-19 DIAGNOSIS — I679 Cerebrovascular disease, unspecified: Secondary | ICD-10-CM

## 2013-04-19 DIAGNOSIS — R51 Headache: Secondary | ICD-10-CM

## 2013-04-19 DIAGNOSIS — E119 Type 2 diabetes mellitus without complications: Secondary | ICD-10-CM | POA: Insufficient documentation

## 2013-04-19 DIAGNOSIS — I5023 Acute on chronic systolic (congestive) heart failure: Secondary | ICD-10-CM

## 2013-04-19 DIAGNOSIS — I209 Angina pectoris, unspecified: Secondary | ICD-10-CM

## 2013-04-19 DIAGNOSIS — J4489 Other specified chronic obstructive pulmonary disease: Secondary | ICD-10-CM | POA: Insufficient documentation

## 2013-04-19 DIAGNOSIS — I10 Essential (primary) hypertension: Secondary | ICD-10-CM | POA: Insufficient documentation

## 2013-04-19 DIAGNOSIS — R079 Chest pain, unspecified: Secondary | ICD-10-CM

## 2013-04-19 LAB — BASIC METABOLIC PANEL
BUN: 18 mg/dL (ref 6–23)
CALCIUM: 9.4 mg/dL (ref 8.4–10.5)
CO2: 27 mEq/L (ref 19–32)
Chloride: 97 mEq/L (ref 96–112)
Creatinine, Ser: 1 mg/dL (ref 0.50–1.35)
GFR calc Af Amer: 90 mL/min — ABNORMAL LOW (ref >90)
GFR, EST NON AFRICAN AMERICAN: 78 mL/min — AB (ref >90)
Glucose, Bld: 296 mg/dL — ABNORMAL HIGH (ref 70–99)
Potassium: 3.9 mEq/L (ref 3.7–5.3)
Sodium: 138 mEq/L (ref 137–147)

## 2013-04-19 LAB — PRO B NATRIURETIC PEPTIDE: PRO B NATRI PEPTIDE: 1316 pg/mL — AB (ref 0–125)

## 2013-04-19 LAB — CBC
HCT: 36.8 % — ABNORMAL LOW (ref 39.0–52.0)
Hemoglobin: 13.1 g/dL (ref 13.0–17.0)
MCH: 31.8 pg (ref 26.0–34.0)
MCHC: 35.6 g/dL (ref 30.0–36.0)
MCV: 89.3 fL (ref 78.0–100.0)
PLATELETS: 248 10*3/uL (ref 150–400)
RBC: 4.12 MIL/uL — AB (ref 4.22–5.81)
RDW: 14.8 % (ref 11.5–15.5)
WBC: 7.7 10*3/uL (ref 4.0–10.5)

## 2013-04-19 LAB — TROPONIN I: Troponin I: <0.3 ng/mL (ref <0.30)

## 2013-04-19 MED ORDER — MORPHINE SULFATE 4 MG/ML IJ SOLN
4.0000 mg | Freq: Once | INTRAMUSCULAR | Status: AC
  Administered 2013-04-19: 4 mg via INTRAVENOUS
  Filled 2013-04-19: qty 1

## 2013-04-19 MED ORDER — ASPIRIN 81 MG PO CHEW
324.0000 mg | CHEWABLE_TABLET | Freq: Once | ORAL | Status: AC
  Administered 2013-04-19: 324 mg via ORAL
  Filled 2013-04-19: qty 4

## 2013-04-19 MED ORDER — NITROGLYCERIN 0.4 MG SL SUBL
0.4000 mg | SUBLINGUAL_TABLET | SUBLINGUAL | Status: DC | PRN
  Administered 2013-04-19: 0.4 mg via SUBLINGUAL

## 2013-04-19 MED ORDER — ONDANSETRON HCL 4 MG/2ML IJ SOLN
4.0000 mg | Freq: Once | INTRAMUSCULAR | Status: AC
  Administered 2013-04-19: 4 mg via INTRAVENOUS
  Filled 2013-04-19: qty 2

## 2013-04-19 MED ORDER — NITROGLYCERIN 0.4 MG SL SUBL
SUBLINGUAL_TABLET | SUBLINGUAL | Status: AC
  Administered 2013-04-19: 21:00:00
  Filled 2013-04-19: qty 62.5

## 2013-04-19 NOTE — ED Notes (Signed)
Mid sternal chest pain onset this evening, states he took 2 nitro for pain without relief, states he does take 325 mg aspirin daily. Pt states he also is sob.  Pain radiates up to neck and left arm

## 2013-04-19 NOTE — H&P (Signed)
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 Today presented to the ED with chief complaint of chest pain and shortness of breath going on for past 4-5 days. At this time chest pain has resolved but he feels like pressure in the middle of chest. Patient is on home oxygen but has been feeling more short of breath over the past few days. Patient also has taken Nitrostat over the past few days and also 3-4 doses today for the ongoing chest pain. Patient also complains of headache, admits to having dysuria and hesitancy of urination. He denies any fever no nausea vomiting or diarrhea. Patient had a cardiac catheterization in November last year he has a history of CABG and has multiple stents. Patient takes aspirin for CAD. In the ED patient was found to have normal troponin and mild elevation of BNP. Patient takes Lasix 80 mg twice a day at home.  Allergies:   Allergies  Allergen Reactions  . Baycol [Cerivastatin]   . Stadol [Butorphanol Tartrate] Other (See Comments)    "makes him crazy"      Past Medical History  Diagnosis Date  . Uncontrolled diabetes mellitus     a. A1C 12.8 in 01/2013.  [name] Cerebral artery occlusion   . Chronic systolic heart failure   . Obesity   . Hyperlipidemia   . Peripheral vascular disease   . Hypertension   . Asthma   . CAD (coronary artery disease)     a. Prior CABG 20 yrs ago (?~1994). b. Hx of multiple stents, prev followed in [place]. c. [place] 01/2013: cath moderate disease in LAD, distal LCx, prox RCA occluded, 2 SVGs occluded, felt to be stable from  prior cath -> for medical therapy.  . Ischemic cardiomyopathy     a. EF 2012: 55-60. b. EF 12/2011: 25-30%. c. Echo 01/2013: EF 15-20%.  . Stroke   . Arthritis   . Anxiety   . Pneumonia   . Depression   . 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 Chronic respiratory failure     a. Due to COPD.    Past Surgical History  Procedure Laterality Date  . Coronary artery bypass graft    . Coronary stent placement  2008    CABG grafts closed  . Joint replacement    . Cholecystectomy    . Eye surgery    . I&d extremity  06/12/2011    Procedure: IRRIGATION AND DEBRIDEMENT EXTREMITY;  Surgeon: Johnn Hai, MD;  Location: [facility];  Service: Orthopedics;  Laterality: Right;  I&D Right [place]    Prior to Admission medications   Medication Sig Start Date End Date Taking? Authorizing Provider  aspirin 325 MG tablet Take 325 mg by mouth daily. *takes one tablet daily for heart health. Also takes additional one to two tablets as needed for arthritis pain associated with shoulder pain*   Yes Historical Provider, MD  atorvastatin (LIPITOR) 40 MG tablet Take 40 mg by mouth every evening.   Yes Historical Provider, MD  cyclobenzaprine (FLEXERIL) 5 MG tablet Take 5 mg by mouth 3 (three) times daily as needed for muscle spasms.  Yes Historical Provider, MD  furosemide (LASIX) 80 MG tablet Take 80 mg by mouth 2 (two) times daily.   Yes Historical Provider, MD  insulin NPH (HUMULIN N,NOVOLIN N) 100 UNIT/ML injection Inject 110 Units into the skin 2 (two) times daily.   Yes Historical Provider, MD  KLOR-CON M20 20 MEQ tablet Take 20 mEq by mouth daily.  01/07/13  Yes Historical Provider, MD  lisinopril (PRINIVIL,ZESTRIL) 2.5 MG tablet Take 1 tablet (2.5 mg total) by mouth daily. 01/18/13  Yes Dayna N Dunn, PA-C  nitroGLYCERIN (NITROSTAT) 0.4 MG SL tablet Place 0.4 mg under the tongue every 5 (five) minutes as needed for chest pain.    Yes Historical Provider, MD   oxyCODONE-acetaminophen (PERCOCET/ROXICET) 5-325 MG per tablet Take 1 tablet by mouth 4 (four) times daily as needed for moderate pain or severe pain.   Yes Historical Provider, MD    Social History:  reports that he quit smoking about 30 years ago. He has never used smokeless tobacco. He reports that he does not drink alcohol or use illicit drugs.  Family History  Problem Relation Age of Onset  . Dementia Mother   . Arthritis Mother   . Heart disease Father   . Heart disease Brother      All the positives are listed in BOLD  Review of Systems:  HEENT: Headache, blurred vision, runny nose, sore throat Neck: Hypothyroidism, hyperthyroidism,,lymphadenopathy Chest : Shortness of breath, history of COPD, Asthma Heart : Chest pain, history of coronary arterey disease GI:  Nausea, vomiting, diarrhea, constipation, GERD GU: Dysuria, urgency, frequency of urination, hematuria Neuro: Stroke, seizures, syncope Psych: Depression, anxiety, hallucinations   Physical Exam: Blood pressure 118/76, pulse 97, temperature 97.7 F (36.5 C), temperature source Oral, resp. rate 16, height _0  (1.905 m), weight 124.739 kg (275 lb), SpO2 98.00%. Constitutional:   Patient is a well-developed and well-nourished male* in no acute distress and cooperative with exam. Head: Normocephalic and atraumatic Mouth: Mucus membranes moist Eyes: PERRL, EOMI, conjunctivae normal Neck: Supple, No Thyromegaly Cardiovascular: RRR, S1 normal, S2 normal Pulmonary/Chest: bibasilar crackles  Abdominal: Soft. Non-tender, non-distended, bowel sounds are normal, no masses, organomegaly, or guarding present.  Neurological: A&O x3, Strenght is normal and symmetric bilaterally, cranial nerve II-XII are grossly intact, no focal motor deficit, sensory intact to light touch bilaterally.  Extremities : No Cyanosis, Clubbing or Edema   Labs on Admission:  Results for orders placed during the hospital encounter of 04/19/13  (from the past 48 hour(s))  CBC     Status: Abnormal   Collection Time    04/19/13  8:33 PM      Result Value Range   WBC 7.7  4.0 - 10.5 K/uL   RBC 4.12 (*) 4.22 - 5.81 MIL/uL   Hemoglobin 13.1  13.0 - 17.0 g/dL   HCT 36.8 (*) 39.0 - 52.0 %   MCV 89.3  78.0 - 100.0 fL   MCH 31.8  26.0 - 34.0 pg   MCHC 35.6  30.0 - 36.0 g/dL   RDW 14.8  11.5 - 15.5 %   Platelets 248  150 - 400 K/uL  BASIC METABOLIC PANEL     Status: Abnormal   Collection Time    04/19/13  8:33 PM      Result Value Range   Sodium 138  137 - 147 mEq/L   Potassium 3.9  3.7 - 5.3 mEq/L   Chloride 97  96 - 112 mEq/L   CO2 27  19 -  32 mEq/L   Glucose, Bld 296 (*) 70 - 99 mg/dL   BUN 18  6 - 23 mg/dL   Creatinine, Ser 1.00  0.50 - 1.35 mg/dL   Calcium 9.4  8.4 - 10.5 mg/dL   GFR calc non Af Amer 78 (*) >90 mL/min   GFR calc Af Amer 90 (*) >90 mL/min   Comment: (NOTE)     The eGFR has been calculated using the CKD EPI equation.     This calculation has not been validated in all clinical situations.     eGFR's persistently <90 mL/min signify possible Chronic Kidney     Disease.  PRO B NATRIURETIC PEPTIDE     Status: Abnormal   Collection Time    04/19/13  8:33 PM      Result Value Range   Pro B Natriuretic peptide (BNP) 1316.0 (*) 0 - 125 pg/mL  TROPONIN I     Status: None   Collection Time    04/19/13  8:33 PM      Result Value Range   Troponin I <0.30  <0.30 ng/mL   Comment:            Due to the release kinetics of cTnI,     a negative result within the first hours     of the onset of symptoms does not rule out     myocardial infarction with certainty.     If myocardial infarction is still suspected,     repeat the test at appropriate intervals.  TROPONIN I     Status: None   Collection Time    04/19/13  9:58 PM      Result Value Range   Troponin I <0.30  <0.30 ng/mL   Comment:            Due to the release kinetics of cTnI,     a negative result within the first hours     of the onset of symptoms  does not rule out     myocardial infarction with certainty.     If myocardial infarction is still suspected,     repeat the test at appropriate intervals.    Radiological Exams on Admission: Dg Chest Port 1 View  04/19/2013   CLINICAL DATA:  Chest pain. Current history of diabetes and chronic systolic heart failure.  EXAM: PORTABLE CHEST - 1 VIEW  COMPARISON:  Two-view chest x-ray 01/17/2013, 08/14/2012. Portable chest x-ray 01/09/2013.  FINDINGS: Prior sternotomy CABG. Cardiac silhouette markedly enlarged but stable. Moderate diffuse interstitial pulmonary opacities, not significantly changed since the prior examinations. No confluent airspace consolidation. No new pulmonary parenchymal abnormalities. No visible pleural effusions.  IMPRESSION: Stable marked cardiomegaly and moderate diffuse interstitial opacities which may reflect chronic edema and/or interstitial fibrosis. No new abnormalities.   Electronically Signed   By: Evangeline Dakin M.D.   On: 04/19/2013 20:57    Assessment/Plan Active Problems:   Obesity   Diabetes mellitus   Hypertension   COPD, severe  O2 dependent   Chest pain  Chest pain-resolved at this time,  patient has history of CAD, will admit the patient and follow troponin I every 6 hours x3. First 2 sets of troponin are negative in the ED. We'll continue the patient on aspirin. Patient had cardiac cath in October 2014 which showedcardiac cath 01/15/13: moderate disease in LAD, distal LCx, prox RCA occluded, 2 SVGs occluded, felt to be stable from prior cath -> for medical therapy  - prior history of  CABG (~20 yrs ago) with multiple stents since that time  Dyspnea- patient has underlying COPD, patient has acute on chronic CHF with EF 15-20%, we'll start the patient on Lasix 20 g IV every 12 hours. Will check BMP in the morning. We'll continue with oxygen.  Diabetes mellitus- we'll continue with the NPH and start sliding scale insulin  Dysuria,? UTI- will obtain UA and  culture as patient has been complaining of dysuria and hesitancy over the past few days  Code status: patient is partial code, no CPR, OK with intubation  Family discussion: the family at bedside, discussed with patient   Time Spent on Admission: 65 min  [facility] Pager: [phone] 04/20/2013, 12:02 AM  If 7PM-7AM, please contact night-coverage  [email or web address]  Password [email or web address]

## 2013-04-19 NOTE — ED Provider Notes (Signed)
CSN: [id number]     Arrival date & time 04/19/13  1957 History  This chart was scribed for Timothy Melter, MD by Dorothey Baseman, ED Scribe. This patient was seen in room APA09/APA09 and the patient's care was started at 8:49 PM.    Chief Complaint  Patient presents with  . Chest Pain  . Shortness of Breath   The history is provided by the patient. No language interpreter was used.   HPI Comments: Timothy Trujillo is a 63 y.o. male with a history of chronic systolic heart failure, hyperlipidemia, HTN, PVD, CAD, COPD, ischemic cardiomyopathy, and chronic respiratory failure who presents to the Emergency Department complaining of an intermittent, pressure-like pain to the mid-sternal chest for the past 4-5 days 4-5/10 currently. He states that the symptoms will come on suddenly and randomly and denies any exacerbating or alleviating factors. Patient is on oxygen at home. Patient reports taking approximately 8-10 doses of Nitrostat over the past few days (3-4 doses today) at home with mild, temporary relief. He reports associated headache and double vision onset yesterday. Patient also reports some nausea, diarrhea, chills, and dizziness upon standing for the past few days. Patient is also complaining of a pain to the hip and lower back onset a few days ago that he states is new for him. He denies fever, emesis. Patient takes 325 mg of aspirin daily. Patient reports a history of cardiac catheterization in October or November of 2014 (2-3 months ago).   Past Medical History  Diagnosis Date  . Uncontrolled diabetes mellitus     a. A1C 12.8 in 01/2013.  [name] Cerebral artery occlusion   . Chronic systolic heart failure   . Obesity   . Hyperlipidemia   . Peripheral vascular disease   . Hypertension   . Asthma   . CAD (coronary artery disease)     a. Prior CABG 20 yrs ago (?~1994). b. Hx of multiple stents, prev followed in [place]. c. [place] 01/2013: cath moderate disease in LAD, distal LCx, prox RCA occluded,  2 SVGs occluded, felt to be stable from prior cath -> for medical therapy.  . Ischemic cardiomyopathy     a. EF 2012: 55-60. b. EF 12/2011: 25-30%. c. Echo 01/2013: EF 15-20%.  . Stroke   . Arthritis   . Anxiety   . Pneumonia   . Depression   . 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 Chronic respiratory failure     a. Due to COPD.   Past Surgical History  Procedure Laterality Date  . Coronary artery bypass graft    . Coronary stent placement  2008    CABG grafts closed  . Joint replacement    . Cholecystectomy    . Eye surgery    . I&d extremity  06/12/2011    Procedure: IRRIGATION AND DEBRIDEMENT EXTREMITY;  Surgeon: Javier Docker, MD;  Location: [facility];  Service: Orthopedics;  Laterality: Right;  I&D Right [place]   Family History  Problem Relation Age of Onset  . Dementia Mother   . Arthritis Mother   . Heart disease Father   . Heart disease Brother    History  Substance Use Topics  . Smoking status: Former Smoker -- 3.00 packs/day    Quit date: 04/09/1983  . Smokeless tobacco: Never Used  . Alcohol Use: No    Review of Systems  Constitutional: Positive for chills. Negative for fever.  Eyes: Positive for visual disturbance.  Cardiovascular: Positive for chest pain.  Gastrointestinal: Positive for nausea and diarrhea. Negative for vomiting.  Musculoskeletal: Positive for arthralgias and back pain.  Neurological: Positive for dizziness and headaches.  All other systems reviewed and are negative.    Allergies  Baycol and Stadol  Home Medications   Current Outpatient Rx  Name  Route  Sig  Dispense  Refill  . aspirin 325 MG tablet   Oral   Take 325 mg by mouth daily. *takes one tablet daily for heart health. Also takes additional one to two tablets as needed for arthritis pain associated with shoulder pain*         . atorvastatin (LIPITOR) 40 MG tablet   Oral   Take 40 mg by mouth every evening.         . cyclobenzaprine  (FLEXERIL) 5 MG tablet   Oral   Take 5 mg by mouth 3 (three) times daily as needed for muscle spasms.         . furosemide (LASIX) 80 MG tablet   Oral   Take 80 mg by mouth 2 (two) times daily.         . insulin NPH (HUMULIN N,NOVOLIN N) 100 UNIT/ML injection   Subcutaneous   Inject 110 Units into the skin 2 (two) times daily.         [name]. KLOR-CON M20 20 MEQ tablet   Oral   Take 20 mEq by mouth daily.          [name]. lisinopril (PRINIVIL,ZESTRIL) 2.5 MG tablet   Oral   Take 1 tablet (2.5 mg total) by mouth daily.   30 tablet   6   . nitroGLYCERIN (NITROSTAT) 0.4 MG SL tablet   Sublingual   Place 0.4 mg under the tongue every 5 (five) minutes as needed for chest pain.          [name]. oxyCODONE-acetaminophen (PERCOCET/ROXICET) 5-325 MG per tablet   Oral   Take 1 tablet by mouth 4 (four) times daily as needed for moderate pain or severe pain.          Triage Vitals: BP 109/64  Pulse 115  Temp(Src) 97.6 F (36.4 C) (Oral)  Resp 20  Ht 6\' 3"  (1.905 m)  Wt 275 lb (124.739 kg)  BMI 34.37 kg/m2  SpO2 89%  Physical Exam  Nursing note and vitals reviewed. Constitutional: He is oriented to person, place, and time. He appears well-developed and well-nourished.  HENT:  Head: Normocephalic and atraumatic.  Right Ear: External ear normal.  Left Ear: External ear normal.  Eyes: Conjunctivae and EOM are normal. Pupils are equal, round, and reactive to light.  Neck: Normal range of motion and phonation normal. Neck supple.  Cardiovascular: Normal rate, regular rhythm, normal heart sounds and intact distal pulses.   Pulmonary/Chest: Effort normal and breath sounds normal. He exhibits no bony tenderness.  Abdominal: Soft. Normal appearance. There is no tenderness.  Musculoskeletal: Normal range of motion.  Neurological: He is alert and oriented to person, place, and time. No cranial nerve deficit or sensory deficit. He exhibits normal muscle tone. Coordination normal.  Skin: Skin  is warm, dry and intact.  Psychiatric: He has a normal mood and affect. His behavior is normal. Judgment and thought content normal.  Patient appears depressed and anxious.     ED Course  Procedures (including critical care time)  DIAGNOSTIC STUDIES: Oxygen Saturation is 89% on room air, low by my interpretation.    COORDINATION OF CARE: 9:01 PM-  Will order blood labs, EKG, and a chest x-ray. Will order aspirin and Nitrostat to manage symptoms. Discussed treatment plan with patient at bedside and patient verbalized agreement.   11:02 PM- Patient states that he feels somewhat better after receiving the medications, but that the shortness of breath and headache have not completely resolved. Discussed that lab and imaging results do not indicate any acute cardiac problems. Will admit the patient to the hospital for further evaluation. Discussed treatment plan with patient at bedside and patient verbalized agreement.   Medications  nitroGLYCERIN (NITROSTAT) SL tablet 0.4 mg (0.4 mg Sublingual Given 04/19/13 2056)  nitroGLYCERIN (NITROSTAT) 0.4 MG SL tablet (  Given 04/19/13 2035)  aspirin chewable tablet 324 mg (324 mg Oral Given 04/19/13 2124)  morphine 4 MG/ML injection 4 mg (4 mg Intravenous Given 04/19/13 2124)  ondansetron (ZOFRAN) injection 4 mg (4 mg Intravenous Given 04/19/13 2124)   Results for orders placed during the hospital encounter of 04/19/13  CBC      Result Value Range   WBC 7.7  4.0 - 10.5 K/uL   RBC 4.12 (*) 4.22 - 5.81 MIL/uL   Hemoglobin 13.1  13.0 - 17.0 g/dL   HCT 16.1 (*) 09.6 - 04.5 %   MCV 89.3  78.0 - 100.0 fL   MCH 31.8  26.0 - 34.0 pg   MCHC 35.6  30.0 - 36.0 g/dL   RDW 40.9  81.1 - 91.4 %   Platelets 248  150 - 400 K/uL  BASIC METABOLIC PANEL      Result Value Range   Sodium 138  137 - 147 mEq/L   Potassium 3.9  3.7 - 5.3 mEq/L   Chloride 97  96 - 112 mEq/L   CO2 27  19 - 32 mEq/L   Glucose, Bld 296 (*) 70 - 99 mg/dL   BUN 18  6 - 23 mg/dL    Creatinine, Ser 7.82  0.50 - 1.35 mg/dL   Calcium 9.4  8.4 - 95.6 mg/dL   GFR calc non Af Amer 78 (*) >90 mL/min   GFR calc Af Amer 90 (*) >90 mL/min  PRO B NATRIURETIC PEPTIDE      Result Value Range   Pro B Natriuretic peptide (BNP) 1316.0 (*) 0 - 125 pg/mL  TROPONIN I      Result Value Range   Troponin I <0.30  <0.30 ng/mL  TROPONIN I      Result Value Range   Troponin I <0.30  <0.30 ng/mL   Dg Chest Port 1 View  04/19/2013   CLINICAL DATA:  Chest pain. Current history of diabetes and chronic systolic heart failure.  EXAM: PORTABLE CHEST - 1 VIEW  COMPARISON:  Two-view chest x-ray 01/17/2013, 08/14/2012. Portable chest x-ray 01/09/2013.  FINDINGS: Prior sternotomy CABG. Cardiac silhouette markedly enlarged but stable. Moderate diffuse interstitial pulmonary opacities, not significantly changed since the prior examinations. No confluent airspace consolidation. No new pulmonary parenchymal abnormalities. No visible pleural effusions.  IMPRESSION: Stable marked cardiomegaly and moderate diffuse interstitial opacities which may reflect chronic edema and/or interstitial fibrosis. No new abnormalities.   Electronically Signed   By: Hulan Saas M.D.   On: 04/19/2013 20:57   Medications  nitroGLYCERIN (NITROSTAT) SL tablet 0.4 mg (0.4 mg Sublingual Given 04/19/13 2056)  nitroGLYCERIN (NITROSTAT) 0.4 MG SL tablet (  Given 04/19/13 2035)  aspirin chewable tablet 324 mg (324 mg Oral Given 04/19/13 2124)  morphine 4 MG/ML injection 4 mg (4 mg Intravenous Given 04/19/13 2124)  ondansetron [facility]) injection 4 mg (4 mg Intravenous Given 04/19/13 2124)    Patient Vitals for the past 24 hrs:  BP Temp Temp src Pulse Resp SpO2 Height Weight  04/19/13 2313 118/76 mmHg 97.7 F (36.5 C) Oral 97 - 98 % - -  04/19/13 2300 96/66 mmHg - - 109 - 99 % - -  04/19/13 2200 121/86 mmHg - - 106 16 98 % - -  04/19/13 2100 109/65 mmHg - - 111 15 98 % - -  04/19/13 2002 109/64 mmHg 97.6 F (36.4 C) Oral 115 20 89 %  6\' 3"  (1.905 m) 275 lb (124.739 kg)    11:25 PM Reevaluation with update and discussion. After initial assessment and treatment, an updated evaluation reveals he feels some better. [place] Filice L      Date: 04/19/13  Rate: 117  Rhythm: sinus tachycardia  QRS Axis: normal  PR and QT Intervals: normal  ST/T Wave abnormalities: normal  PR and QRS Conduction Disutrbances:none  Narrative Interpretation:   Old EKG Reviewed: unchanged   EKG Interpretation   None       MDM   1. Chest pain   2. Headache   3. Acute on chronic systolic heart failure   4. COPD, severe   5. Diabetes mellitus      Nonspecific chest pain, ongoing for several months, worsened in the last week. Had recent cardiac catheterization that was reassuring, and was treated medically. I do not believe that this represents unstable angina. There is no sign of infarct on repeat troponin testing. Patient is to be admitted for further observation trending troponin and an evaluation by his cardiologist.    Nursing Notes Reviewed/ Care Coordinated Applicable Imaging Reviewed Interpretation of Laboratory Data incorporated into ED treatment  I personally performed the services described in this documentation, which was scribed in my presence. The recorded information has been reviewed and is accurate.      Timothy Melter, MD 04/20/13 [phone]

## 2013-04-20 ENCOUNTER — Encounter (HOSPITAL_COMMUNITY): Payer: Self-pay | Admitting: Radiology

## 2013-04-20 ENCOUNTER — Inpatient Hospital Stay (HOSPITAL_COMMUNITY): Payer: Medicare HMO

## 2013-04-20 DIAGNOSIS — E669 Obesity, unspecified: Secondary | ICD-10-CM

## 2013-04-20 DIAGNOSIS — E785 Hyperlipidemia, unspecified: Secondary | ICD-10-CM

## 2013-04-20 DIAGNOSIS — E119 Type 2 diabetes mellitus without complications: Secondary | ICD-10-CM

## 2013-04-20 DIAGNOSIS — I2589 Other forms of chronic ischemic heart disease: Secondary | ICD-10-CM

## 2013-04-20 DIAGNOSIS — J449 Chronic obstructive pulmonary disease, unspecified: Secondary | ICD-10-CM

## 2013-04-20 DIAGNOSIS — I209 Angina pectoris, unspecified: Secondary | ICD-10-CM

## 2013-04-20 DIAGNOSIS — I739 Peripheral vascular disease, unspecified: Secondary | ICD-10-CM

## 2013-04-20 DIAGNOSIS — J628 Pneumoconiosis due to other dust containing silica: Secondary | ICD-10-CM

## 2013-04-20 DIAGNOSIS — I10 Essential (primary) hypertension: Secondary | ICD-10-CM

## 2013-04-20 DIAGNOSIS — I25810 Atherosclerosis of coronary artery bypass graft(s) without angina pectoris: Secondary | ICD-10-CM

## 2013-04-20 DIAGNOSIS — I5023 Acute on chronic systolic (congestive) heart failure: Secondary | ICD-10-CM

## 2013-04-20 DIAGNOSIS — I498 Other specified cardiac arrhythmias: Secondary | ICD-10-CM

## 2013-04-20 DIAGNOSIS — R079 Chest pain, unspecified: Secondary | ICD-10-CM

## 2013-04-20 LAB — URINALYSIS, ROUTINE W REFLEX MICROSCOPIC
Bilirubin Urine: NEGATIVE
Hgb urine dipstick: NEGATIVE
Ketones, ur: NEGATIVE mg/dL
LEUKOCYTES UA: NEGATIVE
NITRITE: NEGATIVE
Protein, ur: 30 mg/dL — AB
SPECIFIC GRAVITY, URINE: 1.02 (ref 1.005–1.030)
Urobilinogen, UA: 0.2 mg/dL (ref 0.0–1.0)
pH: 6.5 (ref 5.0–8.0)

## 2013-04-20 LAB — COMPREHENSIVE METABOLIC PANEL
ALBUMIN: 3.2 g/dL — AB (ref 3.5–5.2)
ALK PHOS: 81 U/L (ref 39–117)
ALT: 8 U/L (ref 0–53)
AST: 10 U/L (ref 0–37)
BUN: 16 mg/dL (ref 6–23)
CO2: 27 mEq/L (ref 19–32)
Calcium: 9.2 mg/dL (ref 8.4–10.5)
Chloride: 102 mEq/L (ref 96–112)
Creatinine, Ser: 1.04 mg/dL (ref 0.50–1.35)
GFR calc Af Amer: 86 mL/min — ABNORMAL LOW (ref >90)
GFR calc non Af Amer: 74 mL/min — ABNORMAL LOW (ref >90)
Glucose, Bld: 209 mg/dL — ABNORMAL HIGH (ref 70–99)
POTASSIUM: 3.9 meq/L (ref 3.7–5.3)
Sodium: 141 mEq/L (ref 137–147)
Total Bilirubin: 0.5 mg/dL (ref 0.3–1.2)
Total Protein: 6.5 g/dL (ref 6.0–8.3)

## 2013-04-20 LAB — GLUCOSE, CAPILLARY
GLUCOSE-CAPILLARY: 175 mg/dL — AB (ref 70–99)
GLUCOSE-CAPILLARY: 242 mg/dL — AB (ref 70–99)
GLUCOSE-CAPILLARY: 295 mg/dL — AB (ref 70–99)
Glucose-Capillary: 214 mg/dL — ABNORMAL HIGH (ref 70–99)
Glucose-Capillary: 250 mg/dL — ABNORMAL HIGH (ref 70–99)

## 2013-04-20 LAB — URINE MICROSCOPIC-ADD ON

## 2013-04-20 LAB — CBC
HCT: 36.6 % — ABNORMAL LOW (ref 39.0–52.0)
Hemoglobin: 12.8 g/dL — ABNORMAL LOW (ref 13.0–17.0)
MCH: 31.4 pg (ref 26.0–34.0)
MCHC: 35 g/dL (ref 30.0–36.0)
MCV: 89.9 fL (ref 78.0–100.0)
PLATELETS: 207 10*3/uL (ref 150–400)
RBC: 4.07 MIL/uL — ABNORMAL LOW (ref 4.22–5.81)
RDW: 14.8 % (ref 11.5–15.5)
WBC: 6.8 10*3/uL (ref 4.0–10.5)

## 2013-04-20 LAB — TROPONIN I
Troponin I: <0.3 ng/mL (ref <0.30)
Troponin I: <0.3 ng/mL (ref <0.30)
Troponin I: <0.3 ng/mL (ref <0.30)
Troponin I: <0.3 ng/mL (ref <0.30)

## 2013-04-20 LAB — HEMOGLOBIN A1C
Hgb A1c MFr Bld: 11.5 % — ABNORMAL HIGH (ref <5.7)
MEAN PLASMA GLUCOSE: 283 mg/dL — AB (ref <117)

## 2013-04-20 LAB — D-DIMER, QUANTITATIVE: D-Dimer, Quant: 0.45 ug/mL-FEU (ref 0.00–0.48)

## 2013-04-20 MED ORDER — ENOXAPARIN SODIUM 40 MG/0.4ML ~~LOC~~ SOLN
40.0000 mg | SUBCUTANEOUS | Status: DC
  Administered 2013-04-20 – 2013-04-21 (×2): 40 mg via SUBCUTANEOUS
  Filled 2013-04-20 (×2): qty 0.4

## 2013-04-20 MED ORDER — SODIUM CHLORIDE 0.9 % IJ SOLN
3.0000 mL | Freq: Two times a day (BID) | INTRAMUSCULAR | Status: DC
  Administered 2013-04-20 – 2013-04-22 (×6): 3 mL via INTRAVENOUS

## 2013-04-20 MED ORDER — ONDANSETRON HCL 4 MG/2ML IJ SOLN
4.0000 mg | Freq: Four times a day (QID) | INTRAMUSCULAR | Status: DC | PRN
  Administered 2013-04-21: 4 mg via INTRAVENOUS
  Filled 2013-04-20: qty 2

## 2013-04-20 MED ORDER — OXYCODONE-ACETAMINOPHEN 5-325 MG PO TABS
1.0000 | ORAL_TABLET | Freq: Four times a day (QID) | ORAL | Status: DC | PRN
  Administered 2013-04-20 – 2013-04-22 (×6): 1 via ORAL
  Filled 2013-04-20 (×6): qty 1

## 2013-04-20 MED ORDER — FUROSEMIDE 80 MG PO TABS
80.0000 mg | ORAL_TABLET | Freq: Two times a day (BID) | ORAL | Status: DC
  Administered 2013-04-20 – 2013-04-22 (×4): 80 mg via ORAL
  Filled 2013-04-20 (×3): qty 1
  Filled 2013-04-20: qty 4

## 2013-04-20 MED ORDER — INSULIN ASPART 100 UNIT/ML ~~LOC~~ SOLN
0.0000 [IU] | Freq: Three times a day (TID) | SUBCUTANEOUS | Status: DC
  Administered 2013-04-20 (×2): 3 [IU] via SUBCUTANEOUS
  Administered 2013-04-20: 2 [IU] via SUBCUTANEOUS
  Administered 2013-04-21: 3 [IU] via SUBCUTANEOUS
  Administered 2013-04-21 – 2013-04-22 (×3): 5 [IU] via SUBCUTANEOUS

## 2013-04-20 MED ORDER — NITROGLYCERIN 0.4 MG SL SUBL
0.4000 mg | SUBLINGUAL_TABLET | SUBLINGUAL | Status: DC | PRN
  Administered 2013-04-20 – 2013-04-21 (×2): 0.4 mg via SUBLINGUAL

## 2013-04-20 MED ORDER — NITROGLYCERIN 0.4 MG SL SUBL
SUBLINGUAL_TABLET | SUBLINGUAL | Status: AC
  Filled 2013-04-20: qty 25

## 2013-04-20 MED ORDER — ASPIRIN 81 MG PO CHEW
324.0000 mg | CHEWABLE_TABLET | Freq: Once | ORAL | Status: AC
  Administered 2013-04-20: 324 mg via ORAL
  Filled 2013-04-20: qty 4

## 2013-04-20 MED ORDER — SODIUM CHLORIDE 0.9 % IV SOLN: 250.0000 mL | INTRAVENOUS | Status: DC | PRN

## 2013-04-20 MED ORDER — FUROSEMIDE 10 MG/ML IJ SOLN
20.0000 mg | Freq: Two times a day (BID) | INTRAMUSCULAR | Status: DC
  Administered 2013-04-20: 20 mg via INTRAVENOUS
  Filled 2013-04-20 (×2): qty 2

## 2013-04-20 MED ORDER — ISOSORBIDE DINITRATE 20 MG PO TABS
10.0000 mg | ORAL_TABLET | Freq: Three times a day (TID) | ORAL | Status: DC
  Administered 2013-04-20 (×3): 10 mg via ORAL
  Filled 2013-04-20 (×3): qty 1

## 2013-04-20 MED ORDER — INSULIN NPH (HUMAN) (ISOPHANE) 100 UNIT/ML ~~LOC~~ SUSP
110.0000 [IU] | Freq: Two times a day (BID) | SUBCUTANEOUS | Status: DC
  Filled 2013-04-20: qty 10

## 2013-04-20 MED ORDER — ATORVASTATIN CALCIUM 40 MG PO TABS
40.0000 mg | ORAL_TABLET | Freq: Every evening | ORAL | Status: DC
  Administered 2013-04-20 – 2013-04-21 (×2): 40 mg via ORAL
  Filled 2013-04-20 (×2): qty 1

## 2013-04-20 MED ORDER — DIGOXIN 125 MCG PO TABS
0.2500 mg | ORAL_TABLET | Freq: Every day | ORAL | Status: DC
  Administered 2013-04-20 – 2013-04-22 (×3): 0.25 mg via ORAL
  Filled 2013-04-20 (×3): qty 2

## 2013-04-20 MED ORDER — LISINOPRIL 5 MG PO TABS
2.5000 mg | ORAL_TABLET | Freq: Every day | ORAL | Status: DC
  Administered 2013-04-20 – 2013-04-22 (×3): 2.5 mg via ORAL
  Filled 2013-04-20 (×3): qty 1

## 2013-04-20 MED ORDER — INSULIN NPH (HUMAN) (ISOPHANE) 100 UNIT/ML ~~LOC~~ SUSP
SUBCUTANEOUS | Status: AC
  Filled 2013-04-20: qty 10

## 2013-04-20 MED ORDER — CYCLOBENZAPRINE HCL 10 MG PO TABS
5.0000 mg | ORAL_TABLET | Freq: Three times a day (TID) | ORAL | Status: DC | PRN
  Administered 2013-04-20: 5 mg via ORAL
  Filled 2013-04-20: qty 1

## 2013-04-20 MED ORDER — SODIUM CHLORIDE 0.9 % IJ SOLN: 3.0000 mL | INTRAMUSCULAR | Status: DC | PRN

## 2013-04-20 MED ORDER — ASPIRIN 325 MG PO TABS: 325.0000 mg | ORAL_TABLET | Freq: Every day | ORAL | Status: DC

## 2013-04-20 MED ORDER — ONDANSETRON HCL 4 MG PO TABS: 4.0000 mg | ORAL_TABLET | Freq: Four times a day (QID) | ORAL | Status: DC | PRN

## 2013-04-20 MED ORDER — POTASSIUM CHLORIDE CRYS ER 20 MEQ PO TBCR
20.0000 meq | EXTENDED_RELEASE_TABLET | Freq: Every day | ORAL | Status: DC
  Administered 2013-04-20 – 2013-04-22 (×3): 20 meq via ORAL
  Filled 2013-04-20 (×3): qty 1

## 2013-04-20 MED ORDER — ASPIRIN 81 MG PO CHEW
CHEWABLE_TABLET | ORAL | Status: AC
  Filled 2013-04-20: qty 3

## 2013-04-20 NOTE — Consult Note (Signed)
CARDIOLOGY CONSULT NOTE   Patient ID: Timothy Trujillo MRN: [id number] DOB/AGE: [birth year] 65 y.o.  Admit Date: 04/19/2013 Referring Physician: Kari Baars MD Primary Physician: Fredirick Maudlin, MD Consulting Cardiologist: Purvis Sheffield, MD Primary Cardiologist:Branch, Christiane Ha MD Reason for Consultation: Chest Pain   Clinical Summary Timothy Trujillo is a 63 y.o.male with known history of chronic systolic CHF in the setting of ICM, EF of 15-20%, CAD s/p CABG in 1994 with stents and multiple other medical problems, admitted with chest pain occuring for several days. Usually occurs at rest, requiring him to take NTG. He states the pain is constant with waxing and waning with use of NTG. Described at sharp radiating from left chest into left jaw and neck, with pain in left arm. At other times, feels heavy, "like elephant sitting on my chest."  He also has a headache that is worsening, with some diplopia on Sunday of this week. He kept putting off coming to the hospital because with use of NTG the pain would lessen.  On day of admission, pain was unrelenting 8/10 associated with worsening shortness of breath. He has a history of chronic dyspnea from lung injury, and COPD.   He was last seen by Dr. Wyline Mood in November of 2014, he was referred for cardiac cath demonstrating occluded SVG to RCA and SVG to OM, recds were for medical management, anatomy not candidate for revascularization. He was continued on medical management with ACE, statin, and ASA.   On arrival to ER, BP 109/64, O2 Sat 89%, glucose was elevated at 296. Cardiac markers are negative.CXR was unremarkable. EKG this am demonstrated atrial fib with RVR, rates in the 130's. He is not on BB or CCB.     At this time, he is without severe chest pain but is complaining of dull pressure over the left chest.         Allergies  Allergen Reactions  . Baycol [Cerivastatin]   . Stadol [Butorphanol Tartrate] Other (See Comments)    "makes him  crazy"    Medications Scheduled Medications: . aspirin      . atorvastatin  40 mg Oral QPM  . enoxaparin (LOVENOX) injection  40 mg Subcutaneous Q24H  . furosemide  20 mg Intravenous Q12H  . insulin aspart  0-9 Units Subcutaneous TID WC  . lisinopril  2.5 mg Oral Daily  . nitroGLYCERIN      . potassium chloride SA  20 mEq Oral Daily  . sodium chloride  3 mL Intravenous Q12H     Infusions:     PRN Medications:  sodium chloride, cyclobenzaprine, nitroGLYCERIN, ondansetron (ZOFRAN) IV, ondansetron, oxyCODONE-acetaminophen, sodium chloride   Past Medical History  Diagnosis Date  . Uncontrolled diabetes mellitus     a. A1C 12.8 in 01/2013.  [name] Cerebral artery occlusion   . Chronic systolic heart failure   . Obesity   . Hyperlipidemia   . Peripheral vascular disease   . Hypertension   . Asthma   . CAD (coronary artery disease)     a. Prior CABG 20 yrs ago (?~1994). b. Hx of multiple stents, prev followed in [place]. c. [place] 01/2013: cath moderate disease in LAD, distal LCx, prox RCA occluded, 2 SVGs occluded, felt to be stable from prior cath -> for medical therapy.  . Ischemic cardiomyopathy     a. EF 2012: 55-60. b. EF 12/2011: 25-30%. c. Echo 01/2013: EF 15-20%.  . Stroke   . Arthritis   . Anxiety   . Pneumonia   .  Depression   . 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 Chronic respiratory failure     a. Due to COPD.    Past Surgical History  Procedure Laterality Date  . Coronary artery bypass graft    . Coronary stent placement  2008    CABG grafts closed  . Joint replacement    . Cholecystectomy    . Eye surgery    . I&d extremity  06/12/2011    Procedure: IRRIGATION AND DEBRIDEMENT EXTREMITY;  Surgeon: Javier Docker, MD;  Location: [facility] OR;  Service: Orthopedics;  Laterality: Right;  I&D Right Tibia    Family History  Problem Relation Age of Onset  . Dementia Mother   . Arthritis Mother   . Heart disease Father   . Heart  disease Brother     Social History Timothy Trujillo reports that he quit smoking about 30 years ago. He has never used smokeless tobacco. Timothy Trujillo reports that he does not drink alcohol.  Review of Systems Otherwise reviewed and negative except as outlined.  Physical Examination Blood pressure 130/82, pulse 99, temperature 97.8 F (36.6 C), temperature source Oral, resp. rate 20, height 6\' 3"  (1.905 m), weight 275 lb (124.739 kg), SpO2 98.00%.  Intake/Output Summary (Last 24 hours) at 04/20/13 0951 Last data filed at 04/20/13 0614  Gross per 24 hour  Intake      0 ml  Output    800 ml  Net   -800 ml    Telemetry: Sinus tachycardia rate of 130 bpm.  HEENT: Conjunctiva and lids normal, oropharynx clear with moist mucosa. Neck: Supple, no elevated JVP or carotid bruits, no thyromegaly. Lungs: Bibasilar crackles.  Cardiac: Regular rate and rhythm,tachycardic no S3 or significant systolic murmur, no pericardial rub. Abdomen: Soft, nontender, no hepatomegaly, bowel sounds present, no guarding or rebound. Extremities: No pitting edema, distal pulses diminished on the left, cool toes, but no cyanosis.. Skin: Warm and dry. Musculoskeletal: No kyphosis. Neuropsychiatric: Alert and oriented x3, affect grossly appropriate.  Prior Cardiac Testing/Procedures 1. Echocardiogram: 01/11/2013 Study data: Technically difficult study. - Left ventricle: The cavity size was mildly dilated. Wall thickness was normal. The estimated ejection fraction was in the range of 15% to 20%. This is significantly worst compared to prior study May 16,2012. The study is not technically sufficient to allow evaluation of LV diastolic function. Internal dimension: 63mm (ED, PLAX). - Aortic valve: Mildly calcified annulus. Mildly thickened leaflets. Uncertain number of leaflets. Valve area: 2.87cm^2(VTI). Valve area: 3.1cm^2 (Vmax). - Left atrium: The atrium was severely dilated. - Right ventricle: The cavity size  was mildly to moderately dilated. Systolic function was moderately reduced. RV TAPSE is 1.2 cm. - Right atrium: The atrium was moderately dilated. - Pulmonary arteries: Systolic pressure was moderately increased. PA peak pressure: 60mm Hg (S).  2. Cardiac Catheterization 01/15/2013 Conclusions:  1. CAD: The LM is ~ 60% . The LAD has mild - moderate irregularities. The distal LCx is occluded and the prox RCA is occluded. Both SVGs are occluded  2. Poor LV function by echo. LVG was not done in order to save contrast.  Given his risk factors ( poorly controlled DM, CHF) I think medical management is indicated.  He will follow up with Dr. Wyline Mood.    Lab Results  Basic Metabolic Panel:  Recent Labs Lab 04/19/13 2033 04/20/13 0711  NA 138 141  K 3.9 3.9  CL 97 102  CO2 27 27  GLUCOSE  296* 209*  BUN 18 16  CREATININE 1.00 1.04  CALCIUM 9.4 9.2    Liver Function Tests:  Recent Labs Lab 04/20/13 0711  AST 10  ALT 8  ALKPHOS 81  BILITOT 0.5  PROT 6.5  ALBUMIN 3.2*    CBC:  Recent Labs Lab 04/19/13 2033 04/20/13 0711  WBC 7.7 6.8  HGB 13.1 12.8*  HCT 36.8* 36.6*  MCV 89.3 89.9  PLT 248 207    Cardiac Enzymes:  Recent Labs Lab 04/19/13 2033 04/19/13 2158 04/20/13 0137 04/20/13 0711 04/20/13 0831  TROPONINI <0.30 <0.30 <0.30 <0.30 <0.30     Radiology: Dg Hip Bilateral W/pelvis  04/20/2013   CLINICAL DATA:  Chronic bilateral hip pain, worse recently  EXAM: BILATERAL HIP WITH PELVIS - 4+ VIEW  COMPARISON:  None.  FINDINGS: No evidence of acute fracture. The femoral heads are located. Mild degenerative changes to the hip joints, without narrowing. Chondrocalcinosis of the right hip labrum. Arterial calcification. No aggressive bone lesion seen, as permitted by overlapping bowel gas.  IMPRESSION: No acute osseous findings.  No significant hip joint narrowing.   Electronically Signed   By: Tiburcio Pea M.D.   On: 04/20/2013 01:07   Dg Chest Port 1  View  04/19/2013   CLINICAL DATA:  Chest pain. Current history of diabetes and chronic systolic heart failure.  EXAM: PORTABLE CHEST - 1 VIEW  COMPARISON:  Two-view chest x-ray 01/17/2013, 08/14/2012. Portable chest x-ray 01/09/2013.  FINDINGS: Prior sternotomy CABG. Cardiac silhouette markedly enlarged but stable. Moderate diffuse interstitial pulmonary opacities, not significantly changed since the prior examinations. No confluent airspace consolidation. No new pulmonary parenchymal abnormalities. No visible pleural effusions.  IMPRESSION: Stable marked cardiomegaly and moderate diffuse interstitial opacities which may reflect chronic edema and/or interstitial fibrosis. No new abnormalities.   Electronically Signed   By: Hulan Saas M.D.   On: 04/19/2013 20:57     ECG: Atrail fib with RVR.   Impression and Recommendations 1.Unstable Angina:In the Setting of ICM with decreased EF. Longstanding history of coronary artery disease, with recent cardiac catheterization in November of 2014, with occlusive disease of grafts and native vessels noted, to be treated medically. He is tachycardic. Would avoid beta blockers in the setting of COPD;calcium channel blocker may cause decrease blood pressure as well. Heart rate can also be contributing to this as well. Consider adding digoxin as creatinine is 1.04. also nitrates. Can consider Ranexa vs. isosorbide if blood pressure drops with addition of heart rate control agent. Is he a candidate of ICD?   2. Atrial Fib: Appears to be paroxysmal. On review of EKG he there was evidence of atrial fibrillation, review of telemetry however shows normal sinus rhythm sinus tachycardia with occasional PACs. Heart rate is not well controlled at rest, running between 100 and 130 beats per minute. Echocardiogram has been completed. He has not on Proventil or steroid treatment which can effect heart rate.CHADS score of 3, CHF, DM, Hypertension, with unadjusted ischemic stroke  rate of 7.1%.. Consider anticoagulation. No history of GIB.   3. COPD: Does not appear to be short of breath at this time. Remains on oxygen. No wheezes are heard on assessment. He does have bibasilar crackles.  4. Possible PAD: Lower extremities are cool to touch with difficult palpation of dorsalis pedis and posterior tibial. I will have Doppler ultrasound completed for evaluation of PAD, doubt DVT, venous Doppler ultrasound can also be considered if this is a concern. Elevate heart rate will check D-Dimer.  Signed: Bettey MareKathryn M. Lyman BishopLawrence NP [facility] 04/20/2013, 9:51 AM Co-Sign MD

## 2013-04-20 NOTE — Plan of Care (Addendum)
Pt complaining of chest pain.  Stat 12 lead ordered. Vitals 132/82, 94% SPO2, 106, 20 resp, 98.7 temp.  Pt is not symptomatic.  No diaphoresis, blurred vision.  Pain traveling to rt shoulder, but has hx of rotator cuff problems (chronic pain).  Dr. Juanetta GoslingHawkins informed. 12 Lead on chart. Nitro and chewable aspirin ordered and given.  Also Cardio consult placed.

## 2013-04-20 NOTE — Progress Notes (Signed)
Inpatient Diabetes Program Recommendations  AACE/ADA: New Consensus Statement on Inpatient Glycemic Control (2013)  Target Ranges:  Prepandial:   less than 140 mg/dL      Peak postprandial:   less than 180 mg/dL (1-2 hours)      Critically ill patients:  140 - 180 mg/dL   Results for Timothy Trujillo, Timothy Trujillo (MRN [id number]) as of 04/20/2013 08:18  Ref. Range 04/20/2013 02:40 04/20/2013 07:27  Glucose-Capillary Latest Range: 70-99 mg/dL 875214 (H) 643175 (H)   Inpatient Diabetes Program Recommendations Insulin - Basal: Please consider discontinuing NPH at this time and re-evaluate need for basal insulin over the course of admission. Correction (SSI): Please consider increasing Novolog correction to resistant scale and adding Novolog bedtime correction. Insulin - Meal Coverage: Will likely need Novolog meal coverage while inpatient.  Note: Patient has a history of diabetes and according to the medication list prior to admission he takes NPH 110 units BID as an outpatient for diabetes management. Currently, patient is ordered to receive NPH 110 units BID and Novolog 0-9 AC for inpatient glycemic control. In talking with the patient during his last admission in October 2014, he reported that he had not been taking the NPH very often because he only takes it when his blood glucose is over 200 mg/dl before breakfast or supper. According to the patient he has lost over 100 lbs over the past 2 years and his blood sugars have been better.  During last admission in October, 2014 patient's blood glucose was managed on Novolog resistant correction ACHS and Novolog meal coverage.  Therefore, while inpatient please discontinue NPH and consider managing inpatient glycemic control with Novolog correction scale and meal coverage. At time of discharge, MD may need to re-evaluate NPH home dose. Melburn Popperalled Matthew, RN and asked him to discuss NPH with Dr. Juanetta GoslingHawkins before administering it and informed of recommendations.  Will continue to  follow as an inpatient  Thanks, Orlando PennerMarie Bryna Razavi, RN, MSN, CCRN Diabetes Coordinator Inpatient Diabetes Program [phone] (Team Pager) [phone] ([facility] office) [phone] [facility] office)

## 2013-04-20 NOTE — Progress Notes (Signed)
Subjective: He was admitted yesterday with chest discomfort. He has been having problems with his chest for several days and says it on multiple occasions he has thought about coming to the emergency department but then he would improve. He came yesterday and was negative as far as troponins are concerned. This morning he is having chest pain again he rates 8/10 and he says it feels like it did when he had previous cardiac issues. This is somewhat responsive to nitroglycerin and he does not have any acute changes on his EKG.  Objective: Vital signs in last 24 hours: Temp:  [97.6 F (36.4 C)-97.8 F (36.6 C)] 97.8 F (36.6 C) (01/13 0525) Pulse Rate:  [97-116] 99 (01/13 0525) Resp:  [15-20] 20 (01/13 0525) BP: (96-133)/(64-86) 130/82 mmHg (01/13 0525) SpO2:  [89 %-99 %] 98 % (01/13 0525) Weight:  [124.739 kg (275 lb)] 124.739 kg (275 lb) (01/12 2002) Weight change:  Last BM Date: 04/19/13  Intake/Output from previous day: 01/12 0701 - 01/13 0700 In: -  Out: 800 [Urine:800]  PHYSICAL EXAM General appearance: alert, cooperative and moderate distress Resp: rhonchi bilaterally Cardio: regular rate and rhythm, S1, S2 normal, no murmur, click, rub or gallop GI: soft, non-tender; bowel sounds normal; no masses,  no organomegaly Extremities: extremities normal, atraumatic, no cyanosis or edema  Lab Results:    Basic Metabolic Panel:  Recent Labs  40/98/1100/03/22 2033 04/20/13 0711  NA 138 141  K 3.9 3.9  CL 97 102  CO2 27 27  GLUCOSE 296* 209*  BUN 18 16  CREATININE 1.00 1.04  CALCIUM 9.4 9.2   Liver Function Tests:  Recent Labs  04/20/13 0711  AST 10  ALT 8  ALKPHOS 81  BILITOT 0.5  PROT 6.5  ALBUMIN 3.2*   No results found for this basename: LIPASE, AMYLASE,  in the last 72 hours No results found for this basename: AMMONIA,  in the last 72 hours CBC:  Recent Labs  04/19/13 2033 04/20/13 0711  WBC 7.7 6.8  HGB 13.1 12.8*  HCT 36.8* 36.6*  MCV 89.3 89.9  PLT  248 207   Cardiac Enzymes:  Recent Labs  04/19/13 2158 04/20/13 0137 04/20/13 0711  TROPONINI <0.30 <0.30 <0.30   BNP:  Recent Labs  04/19/13 2033  PROBNP 1316.0*   D-Dimer: No results found for this basename: DDIMER,  in the last 72 hours CBG:  Recent Labs  04/20/13 0240 04/20/13 0727  GLUCAP 214* 175*   Hemoglobin A1C: No results found for this basename: HGBA1C,  in the last 72 hours Fasting Lipid Panel: No results found for this basename: CHOL, HDL, LDLCALC, TRIG, CHOLHDL, LDLDIRECT,  in the last 72 hours Thyroid Function Tests: No results found for this basename: TSH, T4TOTAL, FREET4, T3FREE, THYROIDAB,  in the last 72 hours Anemia Panel: No results found for this basename: VITAMINB12, FOLATE, FERRITIN, TIBC, IRON, RETICCTPCT,  in the last 72 hours Coagulation: No results found for this basename: LABPROT, INR,  in the last 72 hours Urine Drug Screen: Drugs of Abuse  No results found for this basename: labopia, cocainscrnur, labbenz, amphetmu, thcu, labbarb    Alcohol Level: No results found for this basename: ETH,  in the last 72 hours Urinalysis:  Recent Labs  04/20/13 0205  COLORURINE YELLOW  LABSPEC 1.020  PHURINE 6.5  GLUCOSEU >1000*  HGBUR NEGATIVE  BILIRUBINUR NEGATIVE  KETONESUR NEGATIVE  PROTEINUR 30*  UROBILINOGEN 0.2  NITRITE NEGATIVE  LEUKOCYTESUR NEGATIVE   Misc. Labs:  ABGS No results  found for this basename: PHART, PCO2, PO2ART, TCO2, HCO3,  in the last 72 hours CULTURES No results found for this or any previous visit (from the past 240 hour(s)). Studies/Results: Dg Hip Bilateral W/pelvis  04/20/2013   CLINICAL DATA:  Chronic bilateral hip pain, worse recently  EXAM: BILATERAL HIP WITH PELVIS - 4+ VIEW  COMPARISON:  None.  FINDINGS: No evidence of acute fracture. The femoral heads are located. Mild degenerative changes to the hip joints, without narrowing. Chondrocalcinosis of the right hip labrum. Arterial calcification. No  aggressive bone lesion seen, as permitted by overlapping bowel gas.  IMPRESSION: No acute osseous findings.  No significant hip joint narrowing.   Electronically Signed   By: Tiburcio Pea M.D.   On: 04/20/2013 01:07   Dg Chest Port 1 View  04/19/2013   CLINICAL DATA:  Chest pain. Current history of diabetes and chronic systolic heart failure.  EXAM: PORTABLE CHEST - 1 VIEW  COMPARISON:  Two-view chest x-ray 01/17/2013, 08/14/2012. Portable chest x-ray 01/09/2013.  FINDINGS: Prior sternotomy CABG. Cardiac silhouette markedly enlarged but stable. Moderate diffuse interstitial pulmonary opacities, not significantly changed since the prior examinations. No confluent airspace consolidation. No new pulmonary parenchymal abnormalities. No visible pleural effusions.  IMPRESSION: Stable marked cardiomegaly and moderate diffuse interstitial opacities which may reflect chronic edema and/or interstitial fibrosis. No new abnormalities.   Electronically Signed   By: Hulan Saas M.D.   On: 04/19/2013 20:57    Medications:  Prior to Admission:  Prescriptions prior to admission  Medication Sig Dispense Refill  . aspirin 325 MG tablet Take 325 mg by mouth daily. *takes one tablet daily for heart health. Also takes additional one to two tablets as needed for arthritis pain associated with shoulder pain*      . atorvastatin (LIPITOR) 40 MG tablet Take 40 mg by mouth every evening.      . cyclobenzaprine (FLEXERIL) 5 MG tablet Take 5 mg by mouth 3 (three) times daily as needed for muscle spasms.      . furosemide (LASIX) 80 MG tablet Take 80 mg by mouth 2 (two) times daily.      . insulin NPH (HUMULIN N,NOVOLIN N) 100 UNIT/ML injection Inject 110 Units into the skin 2 (two) times daily.      [name] KLOR-CON M20 20 MEQ tablet Take 20 mEq by mouth daily.       [name] lisinopril (PRINIVIL,ZESTRIL) 2.5 MG tablet Take 1 tablet (2.5 mg total) by mouth daily.  30 tablet  6  . nitroGLYCERIN (NITROSTAT) 0.4 MG SL tablet Place 0.4  mg under the tongue every 5 (five) minutes as needed for chest pain.       [name] oxyCODONE-acetaminophen (PERCOCET/ROXICET) 5-325 MG per tablet Take 1 tablet by mouth 4 (four) times daily as needed for moderate pain or severe pain.       Scheduled: . aspirin      . atorvastatin  40 mg Oral QPM  . enoxaparin (LOVENOX) injection  40 mg Subcutaneous Q24H  . furosemide  20 mg Intravenous Q12H  . insulin aspart  0-9 Units Subcutaneous TID WC  . lisinopril  2.5 mg Oral Daily  . nitroGLYCERIN      . potassium chloride SA  20 mEq Oral Daily  . sodium chloride  3 mL Intravenous Q12H   Continuous:  ZOX:WRUEAV chloride, cyclobenzaprine, nitroGLYCERIN, ondansetron (ZOFRAN) IV, ondansetron, oxyCODONE-acetaminophen, sodium chloride  Assesment: He was admitted with chest pain. He is having a recurrence. He has history of cardiac  disease. 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 He has hypertension and his blood pressure is pretty well controlled. Active Problems:   Obesity   Diabetes mellitus   Hypertension   COPD, severe  O2 dependent   Chest pain    Plan: He will continue with nitroglycerin as needed. I had him chew another aspirin. I will re\re cycle troponins. I will request cardiology consultation.    LOS: 1 day   Shamonica Schadt L 04/20/2013, 8:46 AM

## 2013-04-20 NOTE — Care Management Note (Addendum)
    Page 1 of 1   04/22/2013     10:23:47 AM   CARE MANAGEMENT NOTE 04/22/2013  Patient:  Timothy Trujillo,Timothy Trujillo   Account Number:  [id number]  Date Initiated:  04/20/2013  Documentation initiated by:  Rosemary HolmsOBSON,Adaisha Campise  Subjective/Objective Assessment:   Pt lives alone. Has all the equipment and no HH at this time. Pt does not anticipate any equipment needs. His grandson helps him when he can but is in school     Action/Plan:   Anticipated DC Date:  04/22/2013   Anticipated DC Plan:  HOME/SELF CARE      DC Planning Services  CM consult      Choice offered to / List presented to:          [facility]  [facility]      [facility]  [facility]   Status of service:  Completed, signed off Medicare Important Message given?   (If response is "NO", the following Medicare IM given date fields will be blank) Date Medicare IM given:   Date Additional Medicare IM given:    Discharge Disposition:  HOME W HOME HEALTH SERVICES  Per UR Regulation:    If discussed at Long Length of Stay Meetings, dates discussed:    Comments:  04/20/13 Rosemary HolmsAmy Wilmon Conover RN BSN CM  04/22/13 Helia Haese RN BSN CM Alroy BailiffLinda Lothian [facility].

## 2013-04-20 NOTE — Consult Note (Signed)
The patient was seen and examined, and I agree with the assessment and plan as documented above, with modifications as noted below. 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 He has been taking SL nitroglycerin tablets for the past 4-5 days with relief. Pain radiates to neck and left arm. Has had dizziness but no syncope. No leg swelling. Has had nausea and vomiting. Coronary angiography on 01/15/2013 showed 2 occluded SVG's with no evidence that a LIMA was ever grafted, with moderate LAD disease and prox RCA and distal LCx occlusion. A cath 3 years prior apparently showed similar results. Medical management was recommended. He was discharged on Coreg 6.25 mg bid and lisinopril 2.5 mg daily. He was a "no show" for his appt with Dr. Wyline MoodBranch on 03/03/13.  While he does have severe left atrial enlargement which is certainly a risk factor for atrial fibrillation, I have reviewed current and past ECG's along with all telemetry strips, which all reveal sinus tachycardia with some sinus arrhythmia with PAC's. His ECG has significant baseline artifact but P waves do appear to march out with constant PR intervals. According to notes from 01/2013, Dr. Graciela HusbandsKlein also noted that the pt has a h/o chronic sinus tachycardia of unclear etiology. TSH on 01/08/13 was normal and Hgb now is 12.8 (only mildly anemic). Given his EF of 15-20%, will want to avoid negative inotropes like calcium channel blockers. Also difficult to use beta blockers given his severe COPD/silicosis, although he appeared to be tolerating Coreg and I am uncertain as to why he is no longer on this. I will resume his oral Lasix regimen of  80 mg bid as I believe he is not decompensated from his chronic systolic heart failure. Digoxin has been added to help control HR. I will add Isordil 10 mg tid to help alleviate chest pain as well. He appears to be a candidate for an ICD which can be addressed as an outpatient.

## 2013-04-21 DIAGNOSIS — R51 Headache: Secondary | ICD-10-CM

## 2013-04-21 DIAGNOSIS — I200 Unstable angina: Secondary | ICD-10-CM

## 2013-04-21 LAB — GLUCOSE, CAPILLARY
GLUCOSE-CAPILLARY: 202 mg/dL — AB (ref 70–99)
GLUCOSE-CAPILLARY: 284 mg/dL — AB (ref 70–99)
Glucose-Capillary: 254 mg/dL — ABNORMAL HIGH (ref 70–99)
Glucose-Capillary: 264 mg/dL — ABNORMAL HIGH (ref 70–99)

## 2013-04-21 LAB — URINE CULTURE: Colony Count: 45000

## 2013-04-21 LAB — TROPONIN I: Troponin I: <0.3 ng/mL (ref <0.30)

## 2013-04-21 MED ORDER — CARVEDILOL 3.125 MG PO TABS
3.1250 mg | ORAL_TABLET | Freq: Two times a day (BID) | ORAL | Status: DC
  Administered 2013-04-21 – 2013-04-22 (×3): 3.125 mg via ORAL
  Filled 2013-04-21 (×3): qty 1

## 2013-04-21 MED ORDER — ISOSORBIDE DINITRATE 20 MG PO TABS
10.0000 mg | ORAL_TABLET | Freq: Once | ORAL | Status: AC
  Administered 2013-04-21: 10 mg via ORAL
  Filled 2013-04-21: qty 1

## 2013-04-21 MED ORDER — ISOSORBIDE DINITRATE 20 MG PO TABS
20.0000 mg | ORAL_TABLET | Freq: Three times a day (TID) | ORAL | Status: DC
  Administered 2013-04-21 – 2013-04-22 (×3): 20 mg via ORAL
  Filled 2013-04-21 (×3): qty 1

## 2013-04-21 MED ORDER — CLOPIDOGREL BISULFATE 75 MG PO TABS
75.0000 mg | ORAL_TABLET | Freq: Every day | ORAL | Status: DC
  Administered 2013-04-21 – 2013-04-22 (×2): 75 mg via ORAL
  Filled 2013-04-21 (×2): qty 1

## 2013-04-21 NOTE — Progress Notes (Signed)
The patient was seen and examined, and I agree with the assessment and plan as documented above, with modifications as noted below. Pt had been stable yesterday afternoon/evening, but had significant chest pain this morning. ECG unchanged (NSR with PAC's, q waves in inferior leads not significantly changed from prior). Still tachycardic, but less so after digoxin initiated. Will check one troponin. I do not feel a repeat echocardiogram will help with management, as he has a known severely depressed EF . I will increase isosorbide dinitrate to 20 mg tid and will also now start Coreg 3.125 mg bid to also help with ischemia management, as his tachycardia is likely exacerbating chest pain in the setting of severe CAD. Repeat coronary angiography is not warranted. I will stop Lovenox and start Plavix for unstable angina. Continue current Lasix regimen.

## 2013-04-21 NOTE — Progress Notes (Signed)
Subjective:  Doesn't feel well. Complaining of chest tightness now easing after 1 NTG SL, shortness of breath and fuzzy head.  Objective:  Vital Signs in the last 24 hours: Temp:  [97.2 F (36.2 C)-97.9 F (36.6 C)] 97.9 F (36.6 C) (01/14 0655) Pulse Rate:  [75-102] 102 (01/14 0655) Resp:  [18-20] 20 (01/14 0655) BP: (111-131)/(64-73) 131/73 mmHg (01/14 0655) SpO2:  [97 %-98 %] 97 % (01/14 0655)  Intake/Output from previous day: 01/13 0701 - 01/14 0700 In: 720 [P.O.:720] Out: 800 [Urine:800] Intake/Output from this shift:    Physical Exam: NECK: Without JVD, HJR, or bruit LUNGS: Deacreased breath sounds anterior, posterior, lateral HEART: Regular rate and rhythm, S4 and 2/6 systolic murmur LSB, no rub, bruit, thrill, or heave EXTREMITIES: Without cyanosis, clubbing, or edema  Lab Results:  Recent Labs  04/19/13 2033 04/20/13 0711  WBC 7.7 6.8  HGB 13.1 12.8*  PLT 248 207    Recent Labs  04/19/13 2033 04/20/13 0711  NA 138 141  K 3.9 3.9  CL 97 102  CO2 27 27  GLUCOSE 296* 209*  BUN 18 16  CREATININE 1.00 1.04    Recent Labs  04/20/13 1411 04/20/13 2001  TROPONINI <0.30 <0.30   Hepatic Function Panel  Recent Labs  04/20/13 0711  PROT 6.5  ALBUMIN 3.2*  AST 10  ALT 8  ALKPHOS 81  BILITOT 0.5   No results found for this basename: CHOL,  in the last 72 hours No results found for this basename: PROTIME,  in the last 72 hours  Imaging: CLINICAL DATA:  Lower extremity claudication and rest pain  EXAM: NONINVASIVE PHYSIOLOGIC VASCULAR STUDY OF BILATERAL LOWER EXTREMITIES  IMPRESSION: No significant arterial occlusive disease in the right lower extremity. Minimal arterial occlusive disease in the left lower extremity   Electronically Signed   By: Maryclare BeanArt  Hoss M.D.   On: 04/20/2013 16:02            Cardiac Studies:  Assessment/Plan:  1.Unstable Angina:In the Setting of ICM with decreased EF. Recurrent chest pain this am.EKG  unchanged.Longstanding history of coronary artery disease, with recent cardiac catheterization in10/2014, with occlusive disease of grafts and native vessels noted, to be treated medically. Isordil 5338m TID added yesterday. Will check troponin. Will discuss with MD.  2. Acute on chronic CHF EF 15-20% echo 10/14: continues to diureses on Lasix 80mg  BID  3. Sinus Tachycardia(chronic) Doesn't seem to be atrial fib.  4. Severe O2 dependentCOPD secondary silicosis    LOS: 2 days    Jacolyn ReedyMichele Isla Sabree 04/21/2013, 8:04 AM

## 2013-04-21 NOTE — Progress Notes (Signed)
Utilization Review Complete  

## 2013-04-21 NOTE — Progress Notes (Signed)
Subjective: This morning he says he's having some chest discomfort, nausea and feels lightheaded.  Objective: Vital signs in last 24 hours: Temp:  [97.2 F (36.2 C)-97.9 F (36.6 C)] 97.9 F (36.6 C) (01/14 0655) Pulse Rate:  [75-102] 99 (01/14 0829) Resp:  [18-20] 20 (01/14 0655) BP: (111-144)/(64-77) 144/77 mmHg (01/14 0829) SpO2:  [97 %-99 %] 99 % (01/14 0829) Weight change:  Last BM Date: 04/19/13  Intake/Output from previous day: 01/13 0701 - 01/14 0700 In: 720 [P.O.:720] Out: 800 [Urine:800]  PHYSICAL EXAM General appearance: alert and moderate distress Resp: rhonchi bilaterally Cardio: His heart is regular with occasional early beat. GI: soft, non-tender; bowel sounds normal; no masses,  no organomegaly Extremities: extremities normal, atraumatic, no cyanosis or edema  Lab Results:    Basic Metabolic Panel:  Recent Labs  16/10/96 2033 04/20/13 0711  NA 138 141  K 3.9 3.9  CL 97 102  CO2 27 27  GLUCOSE 296* 209*  BUN 18 16  CREATININE 1.00 1.04  CALCIUM 9.4 9.2   Liver Function Tests:  Recent Labs  04/20/13 0711  AST 10  ALT 8  ALKPHOS 81  BILITOT 0.5  PROT 6.5  ALBUMIN 3.2*   No results found for this basename: LIPASE, AMYLASE,  in the last 72 hours No results found for this basename: AMMONIA,  in the last 72 hours CBC:  Recent Labs  04/19/13 2033 04/20/13 0711  WBC 7.7 6.8  HGB 13.1 12.8*  HCT 36.8* 36.6*  MCV 89.3 89.9  PLT 248 207   Cardiac Enzymes:  Recent Labs  04/20/13 0831 04/20/13 1411 04/20/13 2001  TROPONINI <0.30 <0.30 <0.30   BNP:  Recent Labs  04/19/13 2033  PROBNP 1316.0*   D-Dimer:  Recent Labs  04/20/13 1412  DDIMER 0.45   CBG:  Recent Labs  04/20/13 0240 04/20/13 0727 04/20/13 1155 04/20/13 1650 04/20/13 2056 04/21/13 0736  GLUCAP 214* 175* 250* 242* 295* 254*   Hemoglobin A1C:  Recent Labs  04/20/13 0831  HGBA1C 11.5*   Fasting Lipid Panel: No results found for this basename:  CHOL, HDL, LDLCALC, TRIG, CHOLHDL, LDLDIRECT,  in the last 72 hours Thyroid Function Tests: No results found for this basename: TSH, T4TOTAL, FREET4, T3FREE, THYROIDAB,  in the last 72 hours Anemia Panel: No results found for this basename: VITAMINB12, FOLATE, FERRITIN, TIBC, IRON, RETICCTPCT,  in the last 72 hours Coagulation: No results found for this basename: LABPROT, INR,  in the last 72 hours Urine Drug Screen: Drugs of Abuse  No results found for this basename: labopia, cocainscrnur, labbenz, amphetmu, thcu, labbarb    Alcohol Level: No results found for this basename: ETH,  in the last 72 hours Urinalysis:  Recent Labs  04/20/13 0205  COLORURINE YELLOW  LABSPEC 1.020  PHURINE 6.5  GLUCOSEU >1000*  HGBUR NEGATIVE  BILIRUBINUR NEGATIVE  KETONESUR NEGATIVE  PROTEINUR 30*  UROBILINOGEN 0.2  NITRITE NEGATIVE  LEUKOCYTESUR NEGATIVE   Misc. Labs:  ABGS No results found for this basename: PHART, PCO2, PO2ART, TCO2, HCO3,  in the last 72 hours CULTURES No results found for this or any previous visit (from the past 240 hour(s)). Studies/Results: Dg Hip Bilateral W/pelvis  04/20/2013   CLINICAL DATA:  Chronic bilateral hip pain, worse recently  EXAM: BILATERAL HIP WITH PELVIS - 4+ VIEW  COMPARISON:  None.  FINDINGS: No evidence of acute fracture. The femoral heads are located. Mild degenerative changes to the hip joints, without narrowing. Chondrocalcinosis of the right hip labrum. Arterial  calcification. No aggressive bone lesion seen, as permitted by overlapping bowel gas.  IMPRESSION: No acute osseous findings.  No significant hip joint narrowing.   Electronically Signed   By: Tiburcio Pea M.D.   On: 04/20/2013 01:07   US Arterial Seg Single  04/20/2013   CLINICAL DATA:  Lower extremity claudication and rest pain  EXAM: NONINVASIVE PHYSIOLOGIC VASCULAR STUDY OF BILATERAL LOWER EXTREMITIES  TECHNIQUE: Evaluation of both lower extremities were performed at rest, including  calculation of ankle-brachial indices with single level Doppler, pressure and pulse volume recording.  COMPARISON:  None.  FINDINGS: Right ABI:  1.05  Left ABI:  0.87  Right Lower Extremity: Right posterior tibial and dorsalis pedis waveforms are triphasic.  Left Lower Extremity: Left posterior tibial is biphasic. Left dorsalis pedis is triphasic.  IMPRESSION: No significant arterial occlusive disease in the right lower extremity. Minimal arterial occlusive disease in the left lower extremity   Electronically Signed   By: Maryclare Bean M.D.   On: 04/20/2013 16:02   Dg Chest Port 1 View  04/19/2013   CLINICAL DATA:  Chest pain. Current history of diabetes and chronic systolic heart failure.  EXAM: PORTABLE CHEST - 1 VIEW  COMPARISON:  Two-view chest x-ray 01/17/2013, 08/14/2012. Portable chest x-ray 01/09/2013.  FINDINGS: Prior sternotomy CABG. Cardiac silhouette markedly enlarged but stable. Moderate diffuse interstitial pulmonary opacities, not significantly changed since the prior examinations. No confluent airspace consolidation. No new pulmonary parenchymal abnormalities. No visible pleural effusions.  IMPRESSION: Stable marked cardiomegaly and moderate diffuse interstitial opacities which may reflect chronic edema and/or interstitial fibrosis. No new abnormalities.   Electronically Signed   By: Hulan Saas M.D.   On: 04/19/2013 20:57    Medications:  Prior to Admission:  Prescriptions prior to admission  Medication Sig Dispense Refill  . aspirin 325 MG tablet Take 325 mg by mouth daily. *takes one tablet daily for heart health. Also takes additional one to two tablets as needed for arthritis pain associated with shoulder pain*      . atorvastatin (LIPITOR) 40 MG tablet Take 40 mg by mouth every evening.      . cyclobenzaprine (FLEXERIL) 5 MG tablet Take 5 mg by mouth 3 (three) times daily as needed for muscle spasms.      . furosemide (LASIX) 80 MG tablet Take 80 mg by mouth 2 (two) times daily.       . insulin NPH (HUMULIN N,NOVOLIN N) 100 UNIT/ML injection Inject 110 Units into the skin 2 (two) times daily.      [name] KLOR-CON M20 20 MEQ tablet Take 20 mEq by mouth daily.       [name] lisinopril (PRINIVIL,ZESTRIL) 2.5 MG tablet Take 1 tablet (2.5 mg total) by mouth daily.  30 tablet  6  . nitroGLYCERIN (NITROSTAT) 0.4 MG SL tablet Place 0.4 mg under the tongue every 5 (five) minutes as needed for chest pain.       [name] oxyCODONE-acetaminophen (PERCOCET/ROXICET) 5-325 MG per tablet Take 1 tablet by mouth 4 (four) times daily as needed for moderate pain or severe pain.       Scheduled: . atorvastatin  40 mg Oral QPM  . digoxin  0.25 mg Oral Daily  . enoxaparin (LOVENOX) injection  40 mg Subcutaneous Q24H  . furosemide  80 mg Oral BID  . insulin aspart  0-9 Units Subcutaneous TID WC  . isosorbide dinitrate  10 mg Oral TID  . lisinopril  2.5 mg Oral Daily  . potassium chloride SA  20 mEq Oral Daily  . sodium chloride  3 mL Intravenous Q12H   Continuous:  EAV:[id number]:sodium chloride, cyclobenzaprine, nitroGLYCERIN, ondansetron (ZOFRAN) IV, ondansetron, oxyCODONE-acetaminophen, sodium chloride  Assesment: He says he more chest discomfort. He complains of being lightheaded. His blood pressure is okay at about 140 systolic. His EKG does not show any acute changes and shows he is in sinus rhythm with PACs. She has severe COPD at least partially due to silicosis. His cardiac ejection fraction is very low. Active Problems:   Obesity   Diabetes mellitus   Hypertension   COPD, severe  O2 dependent   Chest pain    Plan: He had nitroglycerin which seems to have helped again. Is not totally clear to me what is happening.    LOS: 2 days   Almer Littleton L 04/21/2013, 8:50 AM

## 2013-04-21 NOTE — Progress Notes (Signed)
Inpatient Diabetes Program Recommendations  AACE/ADA: New Consensus Statement on Inpatient Glycemic Control (2013)  Target Ranges:  Prepandial:   less than 140 mg/dL      Peak postprandial:   less than 180 mg/dL (1-2 hours)      Critically ill patients:  140 - 180 mg/dL   Results for [name], Bonner L (MRN [id number]) as of 04/21/2013 09:06  Ref. Range 04/20/2013 07:27 04/20/2013 11:55 04/20/2013 16:50 04/20/2013 20:56 04/21/2013 07:36  Glucose-Capillary Latest Range: 70-99 mg/dL 295175 (H) 621250 (H) 308242 (H) 295 (H) 254 (H)   Inpatient Diabetes Program Recommendations Correction (SSI): Please consider increasing Novolog correction to resistant scale and adding Novolog bedtime correction. Insulin - Meal Coverage: If patient is eating at least 50% of meals, please consider ordering Novolog 3 units TID with meals.  Thanks, Orlando PennerMarie Ladina Shutters, RN, MSN, CCRN Diabetes Coordinator Inpatient Diabetes Program [phone] (Team Pager) [phone] ([facility] office) [phone] [facility] office)

## 2013-04-22 DIAGNOSIS — I679 Cerebrovascular disease, unspecified: Secondary | ICD-10-CM

## 2013-04-22 LAB — GLUCOSE, CAPILLARY: GLUCOSE-CAPILLARY: 263 mg/dL — AB (ref 70–99)

## 2013-04-22 MED ORDER — CLOPIDOGREL BISULFATE 75 MG PO TABS: 75.0000 mg | ORAL_TABLET | Freq: Every day | ORAL | Status: DC

## 2013-04-22 MED ORDER — DIGOXIN 250 MCG PO TABS: 0.2500 mg | ORAL_TABLET | Freq: Every day | ORAL | Status: DC

## 2013-04-22 MED ORDER — CARVEDILOL 3.125 MG PO TABS: 3.1250 mg | ORAL_TABLET | Freq: Two times a day (BID) | ORAL | Status: DC

## 2013-04-22 MED ORDER — ISOSORBIDE DINITRATE 20 MG PO TABS: 20.0000 mg | ORAL_TABLET | Freq: Three times a day (TID) | ORAL | Status: DC

## 2013-04-22 NOTE — Progress Notes (Signed)
He says he feels much better. He has less chest congestion. He feels like he is back at baseline. No chest pain since yesterday morning.  His exam shows he looks much more comfortable. His temperature is 98.2 pulse 99 respirations 20 blood pressure 124/70 and O2 sat 96%. His chest is much clearer. His heart is regular. He has less tachycardia. His abdomen is soft and his extremities showed no edema.  He was admitted with chest pain likely from his heart. He has severe cardiac disease and severe ischemic cardiomyopathy. Additionally he has COPD which is at least partially from silicosis. However he seems to have generally improved.  I will have him ambulate with his nurse and if he does well I think he can go home this afternoon

## 2013-04-22 NOTE — Progress Notes (Signed)
Subjective:  Feeling much better today. Awaiting PT to help ambulate. If does well he may go home.  Objective:  Vital Signs in the last 24 hours: Temp:  [98 F (36.7 C)-98.2 F (36.8 C)] 98.2 F (36.8 C) (01/15 0439) Pulse Rate:  [97-99] 99 (01/15 0439) Resp:  [20] 20 (01/15 0439) BP: (124-132)/(70-78) 124/70 mmHg (01/15 0439) SpO2:  [95 %-98 %] 96 % (01/15 0439)  Intake/Output from previous day: 01/14 0701 - 01/15 0700 In: 240 [P.O.:240] Out: -  Intake/Output from this shift:    Physical Exam: NECK: Without JVD, HJR, or bruit LUNGS: Clear anterior, posterior, lateral HEART: Regular rate and rhythm, no murmur, gallop, rub, bruit, thrill, or heave EXTREMITIES: Without cyanosis, clubbing, or edema   Lab Results:  Recent Labs  04/19/13 2033 04/20/13 0711  WBC 7.7 6.8  HGB 13.1 12.8*  PLT 248 207    Recent Labs  04/19/13 2033 04/20/13 0711  NA 138 141  K 3.9 3.9  CL 97 102  CO2 27 27  GLUCOSE 296* 209*  BUN 18 16  CREATININE 1.00 1.04    Recent Labs  04/20/13 2001 04/21/13 1116  TROPONINI <0.30 <0.30   Hepatic Function Panel  Recent Labs  04/20/13 0711  PROT 6.5  ALBUMIN 3.2*  AST 10  ALT 8  ALKPHOS 81  BILITOT 0.5   No results found for this basename: CHOL,  in the last 72 hours No results found for this basename: PROTIME,  in the last 72 hours  Imaging:  Cardiac Studies:  Assessment/Plan:  1.Unstable Angina:In the Setting of ICM with decreased EF. Recurrent chest pain yesterday, troponin negative.EKG unchanged.Longstanding history of coronary artery disease, with recent cardiac catheterization in10/2014, with occlusive disease of grafts and native vessels noted, to be treated medically. Isordil increased to 6151m TID yesterday. Coreg added.  2. Acute on chronic CHF EF 15-20% echo 10/14: continues to diureses on Lasix 80mg  BID. Will set up to see EPS as outpaient for possible ICD.  3. Sinus Tachycardia(chronic) Doesn't seem to be atrial  fib.HR better controlled on low dose Coreg.  4. Severe O2 dependentCOPD secondary silicosis     LOS: 3 days    Jacolyn ReedyMichele Lenze 04/22/2013, 9:18 AM

## 2013-04-22 NOTE — Progress Notes (Signed)
The patient was seen and examined, and I agree with the assessment and plan as documented above, with modifications as noted below. Doing much better, and denies chest pain altogether.  Would recommend Plavix x 1 month. Continue Coreg 3.125 mg bid along with Isordil 20 mg tid, in addition to ASA 81 mg daily and Lipitor 40 mg daily. He can be discharged from my standpoint. Can f/u in our clinic within next 1-2 weeks.

## 2013-04-22 NOTE — Progress Notes (Signed)
Ambulated patient in hallway with portable oxygen tank. No shortness of breath, chest pain, or feeling of heart racing per patient. Dr. Juanetta GoslingHawkins notified.

## 2013-04-22 NOTE — Progress Notes (Signed)
IV removed. Discharge instructions reviewed with patient. Understanding verbalized. Awaiting ride for discharge home 

## 2013-04-28 NOTE — Discharge Summary (Signed)
Physician Discharge Summary  Patient ID: Timothy Trujillo MRN: [id number] DOB/AGE: 1950/10/23 63 y.o. Primary Care Physician:Emilea Goga L, MD Admit date: 04/19/2013 Discharge date: 04/28/2013    Discharge Diagnoses:   Active Problems:   Obesity   Diabetes mellitus   Hypertension   COPD, severe  O2 dependent   Chest pain  coronary artery occlusive disease Congestive heart failure Hyperlipidemia Status post stroke Silicosis    Medication List         aspirin 325 MG tablet  Take 325 mg by mouth daily. *takes one tablet daily for heart health. Also takes additional one to two tablets as needed for arthritis pain associated with shoulder pain*     atorvastatin 40 MG tablet  Commonly known as:  LIPITOR  Take 40 mg by mouth every evening.     carvedilol 3.125 MG tablet  Commonly known as:  COREG  Take 1 tablet (3.125 mg total) by mouth 2 (two) times daily with a meal.     clopidogrel 75 MG tablet  Commonly known as:  PLAVIX  Take 1 tablet (75 mg total) by mouth daily with breakfast.     cyclobenzaprine 5 MG tablet  Commonly known as:  FLEXERIL  Take 5 mg by mouth 3 (three) times daily as needed for muscle spasms.     digoxin 0.25 MG tablet  Commonly known as:  LANOXIN  Take 1 tablet (0.25 mg total) by mouth daily.     furosemide 80 MG tablet  Commonly known as:  LASIX  Take 80 mg by mouth 2 (two) times daily.     insulin NPH Human 100 UNIT/ML injection  Commonly known as:  HUMULIN N,NOVOLIN N  Inject 110 Units into the skin 2 (two) times daily.     isosorbide dinitrate 20 MG tablet  Commonly known as:  ISORDIL  Take 1 tablet (20 mg total) by mouth 3 (three) times daily.     KLOR-CON M20 20 MEQ tablet  Generic drug:  potassium chloride SA  Take 20 mEq by mouth daily.     lisinopril 2.5 MG tablet  Commonly known as:  PRINIVIL,ZESTRIL  Take 1 tablet (2.5 mg total) by mouth daily.     nitroGLYCERIN 0.4 MG SL tablet  Commonly known as:  NITROSTAT  Place 0.4  mg under the tongue every 5 (five) minutes as needed for chest pain.     oxyCODONE-acetaminophen 5-325 MG per tablet  Commonly known as:  PERCOCET/ROXICET  Take 1 tablet by mouth 4 (four) times daily as needed for moderate pain or severe pain.        Discharged Condition: Improved    Consults: Cardiology  Significant Diagnostic Studies: Dg Hip Bilateral W/pelvis  04/20/2013   CLINICAL DATA:  Chronic bilateral hip pain, worse recently  EXAM: BILATERAL HIP WITH PELVIS - 4+ VIEW  COMPARISON:  None.  FINDINGS: No evidence of acute fracture. The femoral heads are located. Mild degenerative changes to the hip joints, without narrowing. Chondrocalcinosis of the right hip labrum. Arterial calcification. No aggressive bone lesion seen, as permitted by overlapping bowel gas.  IMPRESSION: No acute osseous findings.  No significant hip joint narrowing.   Electronically Signed   By: Tiburcio Pea M.D.   On: 04/20/2013 01:07   US Arterial Seg Single  04/20/2013   CLINICAL DATA:  Lower extremity claudication and rest pain  EXAM: NONINVASIVE PHYSIOLOGIC VASCULAR STUDY OF BILATERAL LOWER EXTREMITIES  TECHNIQUE: Evaluation of both lower extremities were performed at rest, including calculation of  ankle-brachial indices with single level Doppler, pressure and pulse volume recording.  COMPARISON:  None.  FINDINGS: Right ABI:  1.05  Left ABI:  0.87  Right Lower Extremity: Right posterior tibial and dorsalis pedis waveforms are triphasic.  Left Lower Extremity: Left posterior tibial is biphasic. Left dorsalis pedis is triphasic.  IMPRESSION: No significant arterial occlusive disease in the right lower extremity. Minimal arterial occlusive disease in the left lower extremity   Electronically Signed   By: Maryclare BeanArt  Hoss M.D.   On: 04/20/2013 16:02   Dg Chest Port 1 View  04/19/2013   CLINICAL DATA:  Chest pain. Current history of diabetes and chronic systolic heart failure.  EXAM: PORTABLE CHEST - 1 VIEW  COMPARISON:   Two-view chest x-ray 01/17/2013, 08/14/2012. Portable chest x-ray 01/09/2013.  FINDINGS: Prior sternotomy CABG. Cardiac silhouette markedly enlarged but stable. Moderate diffuse interstitial pulmonary opacities, not significantly changed since the prior examinations. No confluent airspace consolidation. No new pulmonary parenchymal abnormalities. No visible pleural effusions.  IMPRESSION: Stable marked cardiomegaly and moderate diffuse interstitial opacities which may reflect chronic edema and/or interstitial fibrosis. No new abnormalities.   Electronically Signed   By: Hulan Saashomas  Lawrence M.D.   On: 04/19/2013 20:57    Lab Results: Basic Metabolic Panel: No results found for this basename: NA, K, CL, CO2, GLUCOSE, BUN, CREATININE, CALCIUM, MG, PHOS,  in the last 72 hours Liver Function Tests: No results found for this basename: AST, ALT, ALKPHOS, BILITOT, PROT, ALBUMIN,  in the last 72 hours   CBC: No results found for this basename: WBC, NEUTROABS, HGB, HCT, MCV, PLT,  in the last 72 hours  Recent Results (from the past 240 hour(s))  URINE CULTURE     Status: None   Collection Time    04/20/13  2:05 AM      Result Value Range Status   Specimen Description URINE, CLEAN CATCH   Final   Special Requests NONE   Final   Culture  Setup Time     Final   Value: 04/20/2013 02:35     Performed at [facility]   Colony Count     Final   Value: 45,000 COLONIES/ML     Performed at [facility]   Culture     Final   Value: Multiple bacterial morphotypes present, none predominant. Suggest appropriate recollection if clinically indicated.     Performed at [facility]   Report Status 04/21/2013 FINAL   Final     Hospital Course: He came to the emergency department because of increasing shortness of breath and chest tightness. This is been going on for several days. He had previous cardiac catheterization about 2 months ago it was felt he was not a candidate for any sort of  intervention and he should be treated with medical therapy. He is known to have an ejection fraction is very low and is a candidate for pacer defibrillator potentially. He was treated with nitroglycerin and improved. Cardiology consultation was obtained. He continued to have episodes of chest discomfort but did not have any changes in troponin. It was felt that he was still a candidate for medical therapy and he was placed on isosorbide and this did eventually clear his pain.  Discharge Exam: Blood pressure 124/70, pulse 99, temperature 98.2 F (36.8 C), temperature source Oral, resp. rate 20, height 6\' 3"  (1.905 m), weight 124.739 kg (275 lb), SpO2 96.00%. He is awake and alert. He is obese. His chest is clear. His heart  is regular.  Disposition: Home with home health services he will have followup with cardiac electrophysiologist and with primary cardiologist and in my office      Discharge Orders   Future Appointments Provider Department Dept Phone   04/29/2013 8:45 AM Hillis Range, MD [facility] [place] Office [phone]   05/03/2013 2:10 PM Jodelle Gross, NP [facility] [place] [phone]   Future Orders Complete By Expires   Discharge patient  As directed    Face-to-face encounter (required for Medicare/Medicaid patients)  As directed    Comments:     I Trenity Pha L certify that this patient is under my care and that I, or a nurse practitioner or physician's assistant working with me, had a face-to-face encounter that meets the physician face-to-face encounter requirements with this patient on 04/22/2013. The encounter with the patient was in whole, or in part for the following medical condition(s) which is the primary reason for home health care (List medical condition): chest pain/chf   Questions:     The encounter with the patient was in whole, or in part, for the following medical condition, which is the primary reason for home health care:  chest pain/chf   I  certify that, based on my findings, the following services are medically necessary home health services:  Nursing   Physical therapy   My clinical findings support the need for the above services:  Shortness of breath with activity   Further, I certify that my clinical findings support that this patient is homebound due to:  Shortness of Breath with activity   Reason for Medically Necessary Home Health Services:  Skilled Nursing- Change/Decline in Patient Status   Home Health  As directed    Questions:     To provide the following care/treatments:  PT   RN        Signed: Fredirick Maudlin Pager [phone]  04/28/2013, 8:45 AM

## 2013-04-29 ENCOUNTER — Encounter: Payer: Self-pay | Admitting: Internal Medicine

## 2013-04-29 ENCOUNTER — Ambulatory Visit (INDEPENDENT_AMBULATORY_CARE_PROVIDER_SITE_OTHER): Payer: Medicare HMO | Admitting: Internal Medicine

## 2013-04-29 VITALS — BP 130/82 | HR 100 | Ht 75.0 in | Wt 266.0 lb

## 2013-04-29 DIAGNOSIS — J449 Chronic obstructive pulmonary disease, unspecified: Secondary | ICD-10-CM

## 2013-04-29 DIAGNOSIS — I519 Heart disease, unspecified: Secondary | ICD-10-CM | POA: Insufficient documentation

## 2013-04-29 DIAGNOSIS — I255 Ischemic cardiomyopathy: Secondary | ICD-10-CM

## 2013-04-29 DIAGNOSIS — I2589 Other forms of chronic ischemic heart disease: Secondary | ICD-10-CM

## 2013-04-29 DIAGNOSIS — E669 Obesity, unspecified: Secondary | ICD-10-CM

## 2013-04-29 DIAGNOSIS — Z66 Do not resuscitate: Secondary | ICD-10-CM | POA: Insufficient documentation

## 2013-04-29 NOTE — Patient Instructions (Signed)
Your physician recommends that you schedule a follow-up appointment as needed  

## 2013-04-29 NOTE — Progress Notes (Signed)
Primary Care Physician: Fredirick Maudlin, MD Referring Physician:  Dr Earna Coder is a 63 y.o. male with a h/o ischemic CM (EF 15%), NYHA Class III/IV CHF, and chronic lung disease on home O2 who presents for EP consultation regarding risks of sudden death.  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  He underwent CABG in 1994 or so.  He reports having multiple subsequent stents (most recently 2008).  Since that time, medical management has been advised (most recenty at cath 10/14).  He remains very limited and is on home O2.  He has trouble walking from his living room to bathroom and has to use a wheelchair to go farther.  He reports frequent blurred vision and dizziness chronically.  He has SOB at rest and with minimal exertion.  He does not ambulate long enough to get chest pain.  He says that when he stands that his sats drop.  He has a h/o silicosis and prior tobacco use.   He also has a h/o chronic osteomyelitis for which he was previously on suppressive antibiotics following I&D in 2013.  He states that he eventually stopped taking antibiotics because he could not longer afford them.  Notes from Dr Ninetta Lights have been reviewed. Today, he denies symptoms of palpitations, lower extremity edema, presyncope, syncope, or neurologic sequela. The patient is tolerating medications without difficulties and is otherwise without complaint today.   Past Medical History  Diagnosis Date  . Uncontrolled diabetes mellitus     a. A1C 12.8 in 01/2013.  [name] Chronic systolic heart failure   . Obesity   . Hyperlipidemia   . Peripheral vascular disease   . Hypertension   . Asthma   . CAD (coronary artery disease)     a. Prior CABG 20 yrs ago (?~1994). b. Hx of multiple stents, prev followed in [place]. c. [place] 01/2013: cath moderate disease in LAD, distal LCx, prox RCA occluded, 2 SVGs occluded, felt to be stable from prior cath -> for medical therapy.  . Ischemic cardiomyopathy     a. EF 2012:  55-60. b. EF 12/2011: 25-30%. c. Echo 01/2013: EF 15-20%.  . Stroke     15 years ago  . Arthritis   . Anxiety   . Depression   . 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 Chronic respiratory failure     a. Due to COPD.  . Osteomyelitis 2013    was planned for chronic suppressive antibiotics but cannot afford this any longer   Past Surgical History  Procedure Laterality Date  . Coronary artery bypass graft    . Coronary stent placement  2008    CABG grafts closed  . Joint replacement    . Cholecystectomy    . Eye surgery    . I&d extremity  06/12/2011    Procedure: IRRIGATION AND DEBRIDEMENT EXTREMITY;  Surgeon: Javier Docker, MD;  Location: [facility] OR;  Service: Orthopedics;  Laterality: Right;  I&D Right [place]    Current Outpatient Prescriptions  Medication Sig Dispense Refill  . aspirin 325 MG tablet Take 325 mg by mouth daily. *takes one tablet daily for heart health. Also takes additional one to two tablets as needed for arthritis pain associated with shoulder pain*      . atorvastatin (LIPITOR) 40 MG tablet Take 40 mg by mouth every evening.      . carvedilol (COREG) 3.125 MG tablet  Take 1 tablet (3.125 mg total) by mouth 2 (two) times daily with a meal.  60 tablet  12  . clopidogrel (PLAVIX) 75 MG tablet Take 1 tablet (75 mg total) by mouth daily with breakfast.  30 tablet  12  . cyclobenzaprine (FLEXERIL) 5 MG tablet Take 5 mg by mouth 3 (three) times daily as needed for muscle spasms.      . digoxin (LANOXIN) 0.25 MG tablet Take 1 tablet (0.25 mg total) by mouth daily.  30 tablet  12  . furosemide (LASIX) 80 MG tablet Take 80 mg by mouth 2 (two) times daily.      . insulin NPH (HUMULIN N,NOVOLIN N) 100 UNIT/ML injection Inject 110 Units into the skin 2 (two) times daily.      . isosorbide dinitrate (ISORDIL) 20 MG tablet Take 1 tablet (20 mg total) by mouth 3 (three) times daily.  90 tablet  12  . KLOR-CON M20 20 MEQ tablet Take 20  mEq by mouth daily.       [name] lisinopril (PRINIVIL,ZESTRIL) 2.5 MG tablet Take 1 tablet (2.5 mg total) by mouth daily.  30 tablet  6  . nitroGLYCERIN (NITROSTAT) 0.4 MG SL tablet Place 0.4 mg under the tongue every 5 (five) minutes as needed for chest pain.       [name] oxyCODONE-acetaminophen (PERCOCET/ROXICET) 5-325 MG per tablet Take 1 tablet by mouth 4 (four) times daily as needed for moderate pain or severe pain.       No current facility-administered medications for this visit.    Allergies  Allergen Reactions  . Baycol [Cerivastatin]   . Stadol [Butorphanol Tartrate] Other (See Comments)    "makes him crazy"    History   Social History  . Marital Status: Widowed    Spouse Name: N/A    Number of Children: N/A  . Years of Education: N/A   Occupational History  . Not on file.   Social History Main Topics  . Smoking status: Former Smoker -- 3.00 packs/day    Quit date: 04/09/1983  . Smokeless tobacco: Never Used  . Alcohol Use: No  . Drug Use: No  . Sexual Activity: No   Other Topics Concern  . Not on file   Social History Narrative   Pt lives in [place] alone.   Disabled   Previously worked as a Naval architect    Family History  Problem Relation Age of Onset  . Dementia Mother   . Arthritis Mother   . Heart disease Father   . Heart disease Brother     ROS- All systems are reviewed and negative except as per the HPI above  Physical Exam: Filed Vitals:   04/29/13 0907  BP: 130/82  Pulse: 100  Height: 6\' 3"  (1.905 m)  Weight: 266 lb (120.657 kg)    GEN- The patient is chronically ill appearing, alert and oriented x 3 today.  Wearing O2 and ambulating by scooter today Head- normocephalic, atraumatic Eyes-  Sclera clear, conjunctiva pink Ears- hearing intact Oropharynx- clear Neck- supple, + JVD Lungs- Clear to ausculation bilaterally, normal work of breathing, wearing O2 Heart- Regular rate and rhythm, laterally displaced PMI GI- soft, NT, ND, +  BS Extremities- no clubbing, cyanosis, trace edema MS- no significant deformity or atrophy Skin- + nonhealing wound on his R thigh has a bandage in place Psych- euthymic mood, full affect Neuro- strength and sensation are intact  EKG today reveals sinus rhythm 100 bpm, PR 160, QRS 96, Qtc  459, inferior infarction, nonspecific ST/T changes Echo is reviewed Dr Verna CzechBranch's notes are reviewed Inpatient records including Dr Koren BoundKleins notes are reviewed Dr Tyrone AppleHatchers notes from clinic and numerous other epic notes are reviewed   Assessment and Plan:  I had a very long and productive conversation with the patient and his son today about his cardiomyopathy/ chronic systolic dysfunction.  The patient has multiple chronic comorbidities and a very high mortality.  He is quite debilitated and has difficulty with ambulation even within his home.  He recognizes that his functional limitations will not likely improve at that his expected mortality is high.  He is surprised with his wife's rather quick death and expected that he would die before she did.  He is clear that he is "ready to go when (his) time comes".  He does not wish to pursue life prolonging therapies such as ICD implantation.  He is very clear that he would like to be DNI/DNR.  He would like to continue aggressive medical therapy for promotion of quality of life.  He recognizes that ICD implantation would neither improve quality of life nor strengthen his heart.  He also recognizes that with his chronic osteomyelitis that his risks of infection with device implantation would be high. He will follow with Dr Wyline MoodBranch going forward for medical management of his CHF.  I will see as needed going forward.

## 2013-05-03 ENCOUNTER — Ambulatory Visit (INDEPENDENT_AMBULATORY_CARE_PROVIDER_SITE_OTHER): Payer: Medicare HMO | Admitting: Adult Health

## 2013-05-03 ENCOUNTER — Encounter: Payer: Self-pay | Admitting: Adult Health

## 2013-05-03 VITALS — BP 102/59 | HR 95 | Ht 75.0 in | Wt 261.0 lb

## 2013-05-03 DIAGNOSIS — I2589 Other forms of chronic ischemic heart disease: Secondary | ICD-10-CM

## 2013-05-03 DIAGNOSIS — I5023 Acute on chronic systolic (congestive) heart failure: Secondary | ICD-10-CM

## 2013-05-03 DIAGNOSIS — I255 Ischemic cardiomyopathy: Secondary | ICD-10-CM

## 2013-05-03 DIAGNOSIS — I10 Essential (primary) hypertension: Secondary | ICD-10-CM

## 2013-05-03 NOTE — Assessment & Plan Note (Signed)
No evidence of fluid overload. He is advised to cut back on Gator-Aid as this does contain salt.

## 2013-05-03 NOTE — Assessment & Plan Note (Signed)
BP is low normal for him. Will continue the lisinopril and carvediolol. BMET will be drawn prior to next visit.

## 2013-05-03 NOTE — Patient Instructions (Signed)
Your physician recommends that you schedule a follow-up appointment in: 3 months with Dr Branch   Your physician recommends that you continue on your current medications as directed. Please refer to the Current Medication list given to you today.  

## 2013-05-03 NOTE — Progress Notes (Signed)
Name: Timothy Trujillo    DOB: 1950/06/29  Age: 63 y.o.  MR#: [id number]       PCP:  Fredirick Maudlin, MD      Insurance: Payor: [employer]ICARE / Plan: [employer] MEDICARE HMO / Product Type: *No Product type* /   CC:    Chief Complaint  Patient presents with  . Congestive Heart Failure    Systolic  . Coronary Artery Disease  . Cardiomyopathy    VS Filed Vitals:   05/03/13 1446  BP: 102/59  Pulse: 95  Height: 6\' 3"  (1.905 m)  Weight: 261 lb (118.389 kg)    Weights Current Weight  05/03/13 261 lb (118.389 kg)  04/29/13 266 lb (120.657 kg)  04/19/13 275 lb (124.739 kg)    Blood Pressure  BP Readings from Last 3 Encounters:  05/03/13 102/59  04/29/13 130/82  04/22/13 124/70     Admit date:  (Not on file) Last encounter with RMR:  01/26/2013   Allergy Baycol and Stadol  Current Outpatient Prescriptions  Medication Sig Dispense Refill  . Ascorbic Acid (VITAMIN C) 1000 MG tablet Take 1,000 mg by mouth daily.      [name] aspirin 325 MG tablet Take 325 mg by mouth daily. *takes one tablet daily for heart health. Also takes additional one to two tablets as needed for arthritis pain associated with shoulder pain*      . atorvastatin (LIPITOR) 40 MG tablet Take 40 mg by mouth every evening.      . carvedilol (COREG) 3.125 MG tablet Take 1 tablet (3.125 mg total) by mouth 2 (two) times daily with a meal.  60 tablet  12  . clopidogrel (PLAVIX) 75 MG tablet Take 1 tablet (75 mg total) by mouth daily with breakfast.  30 tablet  12  . cyanocobalamin 500 MCG tablet Take 500 mcg by mouth daily.      . cyclobenzaprine (FLEXERIL) 5 MG tablet Take 5 mg by mouth 3 (three) times daily as needed for muscle spasms.      . digoxin (LANOXIN) 0.25 MG tablet Take 1 tablet (0.25 mg total) by mouth daily.  30 tablet  12  . furosemide (LASIX) 80 MG tablet Take 80 mg by mouth 2 (two) times daily.      . insulin NPH (HUMULIN N,NOVOLIN N) 100 UNIT/ML injection Inject 110 Units into the skin 2 (two) times daily.       . isosorbide dinitrate (ISORDIL) 20 MG tablet Take 1 tablet (20 mg total) by mouth 3 (three) times daily.  90 tablet  12  . KLOR-CON M20 20 MEQ tablet Take 20 mEq by mouth daily.       [name] lisinopril (PRINIVIL,ZESTRIL) 2.5 MG tablet Take 1 tablet (2.5 mg total) by mouth daily.  30 tablet  6  . Magnesium 250 MG TABS Take 250 tablets by mouth daily.      . nitroGLYCERIN (NITROSTAT) 0.4 MG SL tablet Place 0.4 mg under the tongue every 5 (five) minutes as needed for chest pain.       [name] oxyCODONE-acetaminophen (PERCOCET/ROXICET) 5-325 MG per tablet Take 1 tablet by mouth 4 (four) times daily as needed for moderate pain or severe pain.      . vitamin E 400 UNIT capsule Take 400 Units by mouth daily.       No current facility-administered medications for this visit.    Discontinued Meds:   There are no discontinued medications.  Patient Active Problem List   Diagnosis Date Noted  .  Chronic systolic dysfunction of left ventricle 04/29/2013  . DNR no code (do not resuscitate) 04/29/2013  . Chest pain 04/19/2013  . Rotator cuff syndrome of left shoulder 02/24/2013  . Acute on chronic systolic heart failure 01/16/2013  . Ischemic cardiomyopathy 01/16/2013  . Sinus tachycardia 01/16/2013  . COPD, severe  O2 dependent 01/16/2013  . Cerebrovascular disease 01/08/2013  . Diabetes mellitus 06/12/2011  . Hypertension 06/12/2011  . Hyperlipidemia 06/12/2011  . Silicosis 06/12/2011  . Osteomyelitis of knee region 06/12/2011  . Cerebral artery occlusion   . Obesity   . Peripheral vascular disease     LABS    Component Value Date/Time   NA 141 04/20/2013 0711   NA 138 04/19/2013 2033   NA 133* 01/18/2013 0500   K 3.9 04/20/2013 0711   K 3.9 04/19/2013 2033   K 4.1 01/18/2013 0500   CL 102 04/20/2013 0711   CL 97 04/19/2013 2033   CL 96 01/18/2013 0500   CO2 27 04/20/2013 0711   CO2 27 04/19/2013 2033   CO2 25 01/18/2013 0500   GLUCOSE 209* 04/20/2013 0711   GLUCOSE 296* 04/19/2013 2033    GLUCOSE 229* 01/18/2013 0500   BUN 16 04/20/2013 0711   BUN 18 04/19/2013 2033   BUN 25* 01/18/2013 0500   CREATININE 1.04 04/20/2013 0711   CREATININE 1.00 04/19/2013 2033   CREATININE 0.95 01/18/2013 0500   CREATININE 1.29 07/19/2011 1207   CALCIUM 9.2 04/20/2013 0711   CALCIUM 9.4 04/19/2013 2033   CALCIUM 9.5 01/18/2013 0500   GFRNONAA 74* 04/20/2013 0711   GFRNONAA 78* 04/19/2013 2033   GFRNONAA 86* 01/18/2013 0500   GFRAA 86* 04/20/2013 0711   GFRAA 90* 04/19/2013 2033   GFRAA >90 01/18/2013 0500   CMP     Component Value Date/Time   NA 141 04/20/2013 0711   K 3.9 04/20/2013 0711   CL 102 04/20/2013 0711   CO2 27 04/20/2013 0711   GLUCOSE 209* 04/20/2013 0711   BUN 16 04/20/2013 0711   CREATININE 1.04 04/20/2013 0711   CREATININE 1.29 07/19/2011 1207   CALCIUM 9.2 04/20/2013 0711   PROT 6.5 04/20/2013 0711   ALBUMIN 3.2* 04/20/2013 0711   AST 10 04/20/2013 0711   ALT 8 04/20/2013 0711   ALKPHOS 81 04/20/2013 0711   BILITOT 0.5 04/20/2013 0711   GFRNONAA 74* 04/20/2013 0711   GFRAA 86* 04/20/2013 0711       Component Value Date/Time   WBC 6.8 04/20/2013 0711   WBC 7.7 04/19/2013 2033   WBC 8.9 01/18/2013 0500   HGB 12.8* 04/20/2013 0711   HGB 13.1 04/19/2013 2033   HGB 14.0 01/18/2013 0500   HCT 36.6* 04/20/2013 0711   HCT 36.8* 04/19/2013 2033   HCT 39.9 01/18/2013 0500   MCV 89.9 04/20/2013 0711   MCV 89.3 04/19/2013 2033   MCV 87.7 01/18/2013 0500    Lipid Panel     Component Value Date/Time   CHOL 245* 01/15/2013 0800   TRIG 340* 01/15/2013 0800   HDL 22* 01/15/2013 0800   CHOLHDL 11.1 01/15/2013 0800   VLDL 68* 01/15/2013 0800   LDLCALC 155* 01/15/2013 0800    ABG    Component Value Date/Time   PHART 7.336* 01/15/2013 1400   PCO2ART 45.6* 01/15/2013 1400   PO2ART 81.0 01/15/2013 1400   HCO3 24.4* 01/15/2013 1400   TCO2 26 01/15/2013 1400   ACIDBASEDEF 2.0 01/15/2013 1400   O2SAT 95.0 01/15/2013 1400     Lab Results  Component  Value Date   TSH 2.076 01/08/2013    BNP (last 3 results)  Recent Labs  01/11/13 0510 01/18/13 0500 04/19/13 2033  PROBNP 1116.0* 1270.0* 1316.0*   Cardiac Panel (last 3 results) No results found for this basename: CKTOTAL, CKMB, TROPONINI, RELINDX,  in the last 72 hours  Iron/TIBC/Ferritin No results found for this basename: iron, tibc, ferritin     EKG Orders placed in visit on 04/29/13  . EKG 12-LEAD     Prior Assessment and Plan Problem List as of 05/03/2013   Silicosis   Osteomyelitis of knee region   Last Assessment & Plan   09/04/2011 Office Visit Edited 09/04/2011 12:01 PM by Ginnie Smart, MD     Somewhat limited with Cx (-). WIll cont his IV therapy as previously written and then transition him to bactrim. He has seen Dr Shelle Iron who also referred him to [facility]. Will see him back in 6 weeks and consider MRI at that time. He will call if he has worsening in the intervening period.    Cerebral artery occlusion   Obesity   Last Assessment & Plan   01/26/2013 Office Visit Written 01/26/2013  1:38 PM by Jodelle Gross, NP     Very sedentary uses wheel chair for ambulation,     Peripheral vascular disease   Diabetes mellitus   Hypertension   Last Assessment & Plan   01/26/2013 Office Visit Written 01/26/2013  1:37 PM by Jodelle Gross, NP     Low normal today.  I will not change his meds at this time. He will have follow up echo in 3 months to evaluate his LV fx. If no improvement, may need to consider ICD pacemaker. Non-compliance issues may prohibit. Will follow closely.    Hyperlipidemia   Last Assessment & Plan   01/26/2013 Office Visit Written 01/26/2013  1:37 PM by Jodelle Gross, NP     Continue statin. Follow up labs in 3 months.    Cerebrovascular disease   Acute on chronic systolic heart failure   Last Assessment & Plan   01/26/2013 Office Visit Written 01/26/2013  1:35 PM by Jodelle Gross, NP     He has gained 5 lbs since discharge and has been eating salty foods again,  to included canned foods and salted meats. He has gone up on lasix to 80 mg per Dr. Juanetta Gosling.  I will repeat his BMET in 2 weeks, and see on close follow up in one month. Due to see Dr.Branch. He has been informed on low sodium diet verbally and in is given low sodium diet in writing.    Ischemic cardiomyopathy   Sinus tachycardia   COPD, severe  O2 dependent   Rotator cuff syndrome of left shoulder   Chest pain   Chronic systolic dysfunction of left ventricle   DNR no code (do not resuscitate)       Imaging: Dg Hip Bilateral W/pelvis  04/20/2013   CLINICAL DATA:  Chronic bilateral hip pain, worse recently  EXAM: BILATERAL HIP WITH PELVIS - 4+ VIEW  COMPARISON:  None.  FINDINGS: No evidence of acute fracture. The femoral heads are located. Mild degenerative changes to the hip joints, without narrowing. Chondrocalcinosis of the right hip labrum. Arterial calcification. No aggressive bone lesion seen, as permitted by overlapping bowel gas.  IMPRESSION: No acute osseous findings.  No significant hip joint narrowing.   Electronically Signed   By: Tiburcio Pea M.D.   On: 04/20/2013 01:07  US Arterial Seg Single  04/20/2013   CLINICAL DATA:  Lower extremity claudication and rest pain  EXAM: NONINVASIVE PHYSIOLOGIC VASCULAR STUDY OF BILATERAL LOWER EXTREMITIES  TECHNIQUE: Evaluation of both lower extremities were performed at rest, including calculation of ankle-brachial indices with single level Doppler, pressure and pulse volume recording.  COMPARISON:  None.  FINDINGS: Right ABI:  1.05  Left ABI:  0.87  Right Lower Extremity: Right posterior tibial and dorsalis pedis waveforms are triphasic.  Left Lower Extremity: Left posterior tibial is biphasic. Left dorsalis pedis is triphasic.  IMPRESSION: No significant arterial occlusive disease in the right lower extremity. Minimal arterial occlusive disease in the left lower extremity   Electronically Signed   By: Maryclare Bean M.D.   On: 04/20/2013 16:02   Dg  Chest Port 1 View  04/19/2013   CLINICAL DATA:  Chest pain. Current history of diabetes and chronic systolic heart failure.  EXAM: PORTABLE CHEST - 1 VIEW  COMPARISON:  Two-view chest x-ray 01/17/2013, 08/14/2012. Portable chest x-ray 01/09/2013.  FINDINGS: Prior sternotomy CABG. Cardiac silhouette markedly enlarged but stable. Moderate diffuse interstitial pulmonary opacities, not significantly changed since the prior examinations. No confluent airspace consolidation. No new pulmonary parenchymal abnormalities. No visible pleural effusions.  IMPRESSION: Stable marked cardiomegaly and moderate diffuse interstitial opacities which may reflect chronic edema and/or interstitial fibrosis. No new abnormalities.   Electronically Signed   By: Hulan Saas M.D.   On: 04/19/2013 20:57

## 2013-05-03 NOTE — Progress Notes (Signed)
HPI: [name] is a 63 year old male patient of Dr. Wyline Mood we are following for ongoing assessment and management of chronic systolic heart failure, CAD status post CABG and stenting, most recent echocardiogram revealed LVEF October 2014 of 15-20% with moderately reduced RV function. Patient had recent cardiac catheterization in October of 2014 demonstrating occluded SVG to RCA and SVG to OM. He was recommended for medical management as he was not a candidate for revascularization due to the anatomy.   Unfortunately the patient was admitted to [facility] in January 2015 secondary to increasing shortness of breath and chest tightness. He was treated with nitroglycerin. He was continued on medical therapy and placed on isosorbide daily. Chest pain resolved and he was discharged. He is here for hospital follow-up.    He comes today with some complaints of dizziness. He is taking medications as directed. He is adamant about being a DNR and is requesting information on Living Will.   Allergies  Allergen Reactions  . Baycol [Cerivastatin]   . Stadol [Butorphanol Tartrate] Other (See Comments)    "makes him crazy"    Current Outpatient Prescriptions  Medication Sig Dispense Refill  . Ascorbic Acid (VITAMIN C) 1000 MG tablet Take 1,000 mg by mouth daily.      [name] aspirin 325 MG tablet Take 325 mg by mouth daily. *takes one tablet daily for heart health. Also takes additional one to two tablets as needed for arthritis pain associated with shoulder pain*      . atorvastatin (LIPITOR) 40 MG tablet Take 40 mg by mouth every evening.      . carvedilol (COREG) 3.125 MG tablet Take 1 tablet (3.125 mg total) by mouth 2 (two) times daily with a meal.  60 tablet  12  . clopidogrel (PLAVIX) 75 MG tablet Take 1 tablet (75 mg total) by mouth daily with breakfast.  30 tablet  12  . cyanocobalamin 500 MCG tablet Take 500 mcg by mouth daily.      . cyclobenzaprine (FLEXERIL) 5 MG tablet Take 5 mg by mouth 3  (three) times daily as needed for muscle spasms.      . digoxin (LANOXIN) 0.25 MG tablet Take 1 tablet (0.25 mg total) by mouth daily.  30 tablet  12  . furosemide (LASIX) 80 MG tablet Take 80 mg by mouth 2 (two) times daily.      . insulin NPH (HUMULIN N,NOVOLIN N) 100 UNIT/ML injection Inject 110 Units into the skin 2 (two) times daily.      . isosorbide dinitrate (ISORDIL) 20 MG tablet Take 1 tablet (20 mg total) by mouth 3 (three) times daily.  90 tablet  12  . KLOR-CON M20 20 MEQ tablet Take 20 mEq by mouth daily.       [name] lisinopril (PRINIVIL,ZESTRIL) 2.5 MG tablet Take 1 tablet (2.5 mg total) by mouth daily.  30 tablet  6  . Magnesium 250 MG TABS Take 250 tablets by mouth daily.      . nitroGLYCERIN (NITROSTAT) 0.4 MG SL tablet Place 0.4 mg under the tongue every 5 (five) minutes as needed for chest pain.       [name] oxyCODONE-acetaminophen (PERCOCET/ROXICET) 5-325 MG per tablet Take 1 tablet by mouth 4 (four) times daily as needed for moderate pain or severe pain.      . vitamin E 400 UNIT capsule Take 400 Units by mouth daily.       No current facility-administered medications for this visit.  Past Medical History  Diagnosis Date  . Uncontrolled diabetes mellitus     a. A1C 12.8 in 01/2013.  [name]. Chronic systolic heart failure   . Obesity   . Hyperlipidemia   . Peripheral vascular disease   . Hypertension   . Asthma   . CAD (coronary artery disease)     a. Prior CABG 20 yrs ago (?~1994). b. Hx of multiple stents, [place] 01[place]: cath moderate disease in LAD, distal LCx, prox RCA occluded, 2 SVGs occluded, felt to be stable from prior cath -> for medical therapy.  . Ischemic cardiomyopathy     a. EF 2012: 55-60. b. EF 12/2011: 25-30%. c. Echo 01/2013: EF 15-20%.  . Stroke     15 years ago  . Arthritis   . Anxiety   . Depression   . 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 Chronic respiratory failure      a. Due to COPD.  . Osteomyelitis 2013    was planned for chronic suppressive antibiotics but cannot afford this any longer  . DNR no code (do not resuscitate)     Past Surgical History  Procedure Laterality Date  . Coronary artery bypass graft    . Coronary stent placement  2008    CABG grafts closed  . Joint replacement    . Cholecystectomy    . Eye surgery    . I&d extremity  06/12/2011    Procedure: IRRIGATION AND DEBRIDEMENT EXTREMITY;  Surgeon: Javier DockerJeffrey C Beane, MD;  Location: [facility] OR;  Service: Orthopedics;  Laterality: Right;  I&D Right Tibia    JYN:WGNFAOROS:Review of systems complete and found to be negative unless listed above  PHYSICAL EXAM BP 102/59  Pulse 95  Ht 6\' 3"  (1.905 m)  Wt 261 lb (118.389 kg)  BMI 32.62 kg/m2  General: Well developed, well nourished, in no acute distress, sitting in a wheelchair wearing O2.  Head: Eyes PERRLA, No xanthomas.   Normal cephalic and atramatic  Lungs: Clear bilaterally to auscultation diminished in the bases.  Heart: HRRR S1 S2, without MRG distant heart sounds..  Pulses are 2+ & equal.            No carotid bruit. No JVD.  No abdominal bruits. No femoral bruits. Abdomen: Bowel sounds are positive, abdomen soft and non-tender without masses or                  Hernia's noted. Msk:  Back normal, normal gait. Normal strength and tone for age. Extremities: No clubbing, cyanosis or edema. Weeping laceration on right pre-tibial area.   DP +1 Neuro: Alert and oriented X 3. Psych:  Good affect, responds appropriately  ASSESSMENT AND PLAN

## 2013-05-03 NOTE — Assessment & Plan Note (Addendum)
He is stable. He has refused ICD and wishes to be DNR. I have given him info on obtaining Living Will from internet or from PCP. He will need to have this available should he be readmitted. In the interim, he will continue current medications. I will not go up on carvediolol at this time as he is is feeling lightheaded and is mildly hypotensive.   I have explained that he will need to have a low BP in the setting of EF of 12%, and will need to get used to medications causing him to feel lightheaded. He will need titration up on next visit with carvedilol if he can tolerate it. He is to see Dr. Wyline MoodBranch as he is established with him.He states that he doesn't always take the lasix daily if he is going to be away from home, but will take it when he returns home.

## 2013-06-21 ENCOUNTER — Ambulatory Visit (HOSPITAL_COMMUNITY)
Admission: RE | Admit: 2013-06-21 | Discharge: 2013-06-21 | Disposition: A | Discharge status: Home / Self Care | Payer: Medicare HMO | Source: Ambulatory Visit | Attending: Pulmonary Disease | Admitting: Pulmonary Disease

## 2013-06-21 ENCOUNTER — Other Ambulatory Visit (HOSPITAL_COMMUNITY): Payer: Self-pay | Admitting: Pulmonary Disease

## 2013-06-21 DIAGNOSIS — S91009A Unspecified open wound, unspecified ankle, initial encounter: Principal | ICD-10-CM

## 2013-06-21 DIAGNOSIS — M79609 Pain in unspecified limb: Secondary | ICD-10-CM | POA: Insufficient documentation

## 2013-06-21 DIAGNOSIS — S81809A Unspecified open wound, unspecified lower leg, initial encounter: Principal | ICD-10-CM

## 2013-06-21 DIAGNOSIS — R937 Abnormal findings on diagnostic imaging of other parts of musculoskeletal system: Secondary | ICD-10-CM | POA: Insufficient documentation

## 2013-06-21 DIAGNOSIS — M79604 Pain in right leg: Secondary | ICD-10-CM

## 2013-06-21 DIAGNOSIS — S81009A Unspecified open wound, unspecified knee, initial encounter: Secondary | ICD-10-CM | POA: Insufficient documentation

## 2013-06-21 DIAGNOSIS — X58XXXA Exposure to other specified factors, initial encounter: Secondary | ICD-10-CM | POA: Insufficient documentation

## 2013-07-05 ENCOUNTER — Ambulatory Visit (INDEPENDENT_AMBULATORY_CARE_PROVIDER_SITE_OTHER): Payer: Commercial Managed Care - HMO | Admitting: Internal Medicine

## 2013-07-05 VITALS — BP 116/70 | HR 101 | Wt 254.0 lb

## 2013-07-05 DIAGNOSIS — M869 Osteomyelitis, unspecified: Secondary | ICD-10-CM

## 2013-07-05 MED ORDER — SULFAMETHOXAZOLE-TRIMETHOPRIM 800-160 MG PO TABS: 1.0000 | ORAL_TABLET | Freq: Two times a day (BID) | ORAL | Status: DC

## 2013-07-05 MED ORDER — NYSTATIN DOMESTIC POWD: Status: DC

## 2013-07-05 NOTE — Progress Notes (Signed)
Patient ID: Timothy Trujillo, male   DOB: 28-May-1950, 63 y.o.   MRN: [id number]         [facility] for Infectious Disease  Patient Active Problem List   Diagnosis Date Noted  . Osteomyelitis of knee region 06/12/2011    Priority: High  . Chronic systolic dysfunction of left ventricle 04/29/2013  . DNR no code (do not resuscitate) 04/29/2013  . Chest pain 04/19/2013  . Rotator cuff syndrome of left shoulder 02/24/2013  . Acute on chronic systolic heart failure 01/16/2013  . Ischemic cardiomyopathy 01/16/2013  . Sinus tachycardia 01/16/2013  . COPD, severe  O2 dependent 01/16/2013  . Cerebrovascular disease 01/08/2013  . Diabetes mellitus 06/12/2011  . Hypertension 06/12/2011  . Hyperlipidemia 06/12/2011  . Silicosis 06/12/2011  . Cerebral artery occlusion   . Obesity   . Peripheral vascular disease     Patient's Medications  New Prescriptions   NYSTATIN DOMESTIC POWD    Apply powder to rash twice daily  Previous Medications   ASCORBIC ACID (VITAMIN C) 1000 MG TABLET    Take 1,000 mg by mouth daily.   ASPIRIN 325 MG TABLET    Take 325 mg by mouth daily. *takes one tablet daily for heart health. Also takes additional one to two tablets as needed for arthritis pain associated with shoulder pain*   ATORVASTATIN (LIPITOR) 40 MG TABLET    Take 40 mg by mouth every evening.   CARVEDILOL (COREG) 3.125 MG TABLET    Take 1 tablet (3.125 mg total) by mouth 2 (two) times daily with a meal.   CYANOCOBALAMIN 500 MCG TABLET    Take 500 mcg by mouth daily.   CYCLOBENZAPRINE (FLEXERIL) 5 MG TABLET    Take 5 mg by mouth 3 (three) times daily as needed for muscle spasms.   DIGOXIN (LANOXIN) 0.25 MG TABLET    Take 1 tablet (0.25 mg total) by mouth daily.   FUROSEMIDE (LASIX) 80 MG TABLET    Take 80 mg by mouth 2 (two) times daily.   INSULIN NPH (HUMULIN N,NOVOLIN N) 100 UNIT/ML INJECTION    Inject 110 Units into the skin 2 (two) times daily.   ISOSORBIDE DINITRATE (ISORDIL) 20 MG TABLET     Take 1 tablet (20 mg total) by mouth 3 (three) times daily.   KLOR-CON M20 20 MEQ TABLET    Take 20 mEq by mouth daily.    LISINOPRIL (PRINIVIL,ZESTRIL) 2.5 MG TABLET    Take 1 tablet (2.5 mg total) by mouth daily.   MAGNESIUM 250 MG TABS    Take 250 tablets by mouth daily.   NITROGLYCERIN (NITROSTAT) 0.4 MG SL TABLET    Place 0.4 mg under the tongue every 5 (five) minutes as needed for chest pain.    OXYCODONE-ACETAMINOPHEN (PERCOCET/ROXICET) 5-325 MG PER TABLET    Take 1 tablet by mouth 4 (four) times daily as needed for moderate pain or severe pain.   VITAMIN E 400 UNIT CAPSULE    Take 400 Units by mouth daily.  Modified Medications   Modified Medication Previous Medication   SULFAMETHOXAZOLE-TRIMETHOPRIM (BACTRIM DS,SEPTRA DS) 800-160 MG PER TABLET sulfamethoxazole-trimethoprim (BACTRIM DS,SEPTRA DS) 800-160 MG per tablet      Take 1 tablet by mouth 2 (two) times daily.    Take 1 tablet by mouth 2 (two) times daily.  Discontinued Medications   CLOPIDOGREL (PLAVIX) 75 MG TABLET    Take 1 tablet (75 mg total) by mouth daily with breakfast.    Subjective: Timothy Trujillo  is referred to me by Dr. Paula LibraJeff Beane for evaluation of his chronic left tibial osteomyelitis, sequestrum and sinus tract. He was seen by my partner, Dr. Ninetta LightsHatcher, 2 years ago for the same problem. He underwent incision and drainage on 06/12/2011. Stains and cultures were negative. He received 5 weeks of IV vancomycin and ceftriaxone followed by doxycycline. Repeat MRI showed worsening. Sinus wound drainage cultures were again negative. He was started on vancomycin and ertapenem on 08/06/2011. He was seen by Dr. Ninetta LightsHatcher for the last time on 09/04/2011. He was doing slightly better and it appears the plan was to transition him to oral trimethoprim sulfamethoxazole. [facility] who did not make any additional recommendations. Timothy Trujillo states that he stopped taking the IV antibiotics 6 months after his last  surgery when he could no longer afford them. He states that he had lots of problems with yeast infection in his groin that was treated by his primary care physician. We stopped the IV antibiotics in the fall of 2013 he did not transition to any oral therapy. About 6 weeks ago he noted a slight foul smell from some of the sinus tract drainage. He states the drainage has not changed in amount or color over the last year. He has occasional throbbing pain in his left knee. He was seen by Dr. Juanetta GoslingHawkins recently who referred him back to Dr. Paula LibraJeff Beane. I do not have their records. Timothy Trujillo states that he had a repeat x-ray that apparently did not reveal any new changes. He was started on trimethoprim sulfamethoxazole about 2 weeks ago. He has had decreased sinus tract drainage and a slight decrease in pain. He has some itching in his groin but no rash.   Review of Systems: Pertinent items are noted in HPI.  Past Medical History  Diagnosis Date  . Uncontrolled diabetes mellitus     a. A1C 12.8 in 01/2013.  [name]. Chronic systolic heart failure   . Obesity   . Hyperlipidemia   . Peripheral vascular disease   . Hypertension   . Asthma   . CAD (coronary artery disease)     a. Prior CABG 20 yrs ago (?~1994). b. Hx of [place] st[place], [place]: cath moderate disease in LAD, distal LCx, prox RCA occluded, 2 SVGs occluded, felt to be stable from prior cath -> for medical therapy.  . Ischemic cardiomyopathy     a. EF 2012: 55-60. b. EF 12/2011: 25-30%. c. Echo 01/2013: EF 15-20%.  . Stroke     15 years ago  . Arthritis   . Anxiety   . Depression   . 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 Chronic respiratory failure     a. Due to COPD.  . Osteomyelitis 2013    was planned for chronic suppressive antibiotics but cannot afford this any longer  . DNR no code (do not resuscitate)     History  Substance Use Topics  . Smoking  status: Former Smoker -- 3.00 packs/day    Quit date: 04/09/1983  . Smokeless tobacco: Never Used  . Alcohol Use: No    Family History  Problem Relation Age of Onset  . Dementia Mother   . Arthritis Mother   . Heart disease Father   . Heart disease Brother     Allergies  Allergen Reactions  . Baycol [Cerivastatin]   .  Stadol [Butorphanol Tartrate] Other (See Comments)    "makes him crazy"    Objective: BP: 116/70 mmHg (03/30 1349) Pulse Rate: 101 (03/30 1349)  General:  he is in no distress seated in his wheelchair Skin: No rash  Lungs:  he is wearing supplemental nasal prong oxygen  Left knee: He has a Band-Aid over the chronic sinus tract. There is no expressible drainage or surrounding cellulitis   Lab Results Lab Results  Component Value Date   CRP 3.07* 07/19/2011   Lab Results  Component Value Date   ESRSEDRATE 22* 07/19/2011     Assessment: He has chronic osteomyelitis it is probably incurable without major surgery and he is a very poor surgical candidate. He seemed to have some improvement with empiric, suppressive trimethoprim sulfamethoxazole and I would recommend continuing it for now.   Plan: 1.  continue chronic, suppressive trimethoprim sulfamethoxazole  2.  nystatin powder as needed for groin candidiasis  3.  check sedimentation rate and C-reactive protein 4.  followup in 6 weeks   Cliffton Asters, MD [facility] for Infectious Disease [facility] [phone] pager   [phone] cell 07/05/2013, 2:33 PM

## 2013-07-06 LAB — BASIC METABOLIC PANEL
BUN: 22 mg/dL (ref 6–23)
CALCIUM: 9.6 mg/dL (ref 8.4–10.5)
CO2: 29 mEq/L (ref 19–32)
CREATININE: 1.89 mg/dL — AB (ref 0.50–1.35)
Chloride: 93 mEq/L — ABNORMAL LOW (ref 96–112)
Glucose, Bld: 296 mg/dL — ABNORMAL HIGH (ref 70–99)
Potassium: 5.6 mEq/L — ABNORMAL HIGH (ref 3.5–5.3)
Sodium: 134 mEq/L — ABNORMAL LOW (ref 135–145)

## 2013-07-06 LAB — SEDIMENTATION RATE: SED RATE: 17 mm/h — AB (ref 0–16)

## 2013-07-06 LAB — C-REACTIVE PROTEIN: CRP: 0.5 mg/dL (ref <0.60)

## 2013-07-30 ENCOUNTER — Emergency Department (HOSPITAL_COMMUNITY): Payer: Medicare HMO

## 2013-07-30 ENCOUNTER — Encounter (HOSPITAL_COMMUNITY): Payer: Self-pay | Admitting: Emergency Medicine

## 2013-07-30 ENCOUNTER — Inpatient Hospital Stay (HOSPITAL_COMMUNITY)
Admission: EM | Admit: 2013-07-30 | Discharge: 2013-08-01 | DRG: 281 | Disposition: A | Discharge status: Home / Self Care | Payer: Medicare HMO | Attending: Cardiology | Admitting: Cardiology

## 2013-07-30 DIAGNOSIS — R000 Tachycardia, unspecified: Secondary | ICD-10-CM

## 2013-07-30 DIAGNOSIS — I519 Heart disease, unspecified: Secondary | ICD-10-CM | POA: Diagnosis present

## 2013-07-30 DIAGNOSIS — J628 Pneumoconiosis due to other dust containing silica: Secondary | ICD-10-CM | POA: Diagnosis present

## 2013-07-30 DIAGNOSIS — IMO0001 Reserved for inherently not codable concepts without codable children: Secondary | ICD-10-CM | POA: Diagnosis present

## 2013-07-30 DIAGNOSIS — I5023 Acute on chronic systolic (congestive) heart failure: Secondary | ICD-10-CM

## 2013-07-30 DIAGNOSIS — J4489 Other specified chronic obstructive pulmonary disease: Secondary | ICD-10-CM | POA: Diagnosis present

## 2013-07-30 DIAGNOSIS — Z9981 Dependence on supplemental oxygen: Secondary | ICD-10-CM

## 2013-07-30 DIAGNOSIS — Z7982 Long term (current) use of aspirin: Secondary | ICD-10-CM

## 2013-07-30 DIAGNOSIS — I959 Hypotension, unspecified: Secondary | ICD-10-CM | POA: Diagnosis not present

## 2013-07-30 DIAGNOSIS — I679 Cerebrovascular disease, unspecified: Secondary | ICD-10-CM

## 2013-07-30 DIAGNOSIS — M1710 Unilateral primary osteoarthritis, unspecified knee: Secondary | ICD-10-CM | POA: Diagnosis present

## 2013-07-30 DIAGNOSIS — E119 Type 2 diabetes mellitus without complications: Secondary | ICD-10-CM | POA: Diagnosis present

## 2013-07-30 DIAGNOSIS — J449 Chronic obstructive pulmonary disease, unspecified: Secondary | ICD-10-CM | POA: Diagnosis present

## 2013-07-30 DIAGNOSIS — I700 Atherosclerosis of aorta: Secondary | ICD-10-CM | POA: Diagnosis present

## 2013-07-30 DIAGNOSIS — I2589 Other forms of chronic ischemic heart disease: Secondary | ICD-10-CM | POA: Diagnosis present

## 2013-07-30 DIAGNOSIS — M75102 Unspecified rotator cuff tear or rupture of left shoulder, not specified as traumatic: Secondary | ICD-10-CM

## 2013-07-30 DIAGNOSIS — R748 Abnormal levels of other serum enzymes: Secondary | ICD-10-CM

## 2013-07-30 DIAGNOSIS — I255 Ischemic cardiomyopathy: Secondary | ICD-10-CM

## 2013-07-30 DIAGNOSIS — Z951 Presence of aortocoronary bypass graft: Secondary | ICD-10-CM

## 2013-07-30 DIAGNOSIS — M869 Osteomyelitis, unspecified: Secondary | ICD-10-CM

## 2013-07-30 DIAGNOSIS — R079 Chest pain, unspecified: Secondary | ICD-10-CM

## 2013-07-30 DIAGNOSIS — I669 Occlusion and stenosis of unspecified cerebral artery: Secondary | ICD-10-CM

## 2013-07-30 DIAGNOSIS — I509 Heart failure, unspecified: Secondary | ICD-10-CM | POA: Diagnosis present

## 2013-07-30 DIAGNOSIS — Z66 Do not resuscitate: Secondary | ICD-10-CM | POA: Diagnosis present

## 2013-07-30 DIAGNOSIS — I214 Non-ST elevation (NSTEMI) myocardial infarction: Principal | ICD-10-CM | POA: Diagnosis present

## 2013-07-30 DIAGNOSIS — I200 Unstable angina: Secondary | ICD-10-CM | POA: Diagnosis present

## 2013-07-30 DIAGNOSIS — I2789 Other specified pulmonary heart diseases: Secondary | ICD-10-CM | POA: Diagnosis present

## 2013-07-30 DIAGNOSIS — E785 Hyperlipidemia, unspecified: Secondary | ICD-10-CM

## 2013-07-30 DIAGNOSIS — E669 Obesity, unspecified: Secondary | ICD-10-CM | POA: Diagnosis present

## 2013-07-30 DIAGNOSIS — Z79899 Other long term (current) drug therapy: Secondary | ICD-10-CM

## 2013-07-30 DIAGNOSIS — I10 Essential (primary) hypertension: Secondary | ICD-10-CM | POA: Diagnosis present

## 2013-07-30 DIAGNOSIS — Z87891 Personal history of nicotine dependence: Secondary | ICD-10-CM

## 2013-07-30 DIAGNOSIS — I2510 Atherosclerotic heart disease of native coronary artery without angina pectoris: Secondary | ICD-10-CM | POA: Diagnosis present

## 2013-07-30 DIAGNOSIS — I5022 Chronic systolic (congestive) heart failure: Secondary | ICD-10-CM | POA: Diagnosis present

## 2013-07-30 DIAGNOSIS — I739 Peripheral vascular disease, unspecified: Secondary | ICD-10-CM

## 2013-07-30 DIAGNOSIS — J9610 Chronic respiratory failure, unspecified whether with hypoxia or hypercapnia: Secondary | ICD-10-CM | POA: Diagnosis present

## 2013-07-30 DIAGNOSIS — IMO0002 Reserved for concepts with insufficient information to code with codable children: Secondary | ICD-10-CM | POA: Diagnosis present

## 2013-07-30 DIAGNOSIS — Z8673 Personal history of transient ischemic attack (TIA), and cerebral infarction without residual deficits: Secondary | ICD-10-CM

## 2013-07-30 LAB — PROTIME-INR
INR: 0.96 (ref 0.00–1.49)
Prothrombin Time: 12.6 seconds (ref 11.6–15.2)

## 2013-07-30 LAB — CBC WITH DIFFERENTIAL/PLATELET
BASOS PCT: 0 % (ref 0–1)
Basophils Absolute: 0 10*3/uL (ref 0.0–0.1)
Eosinophils Absolute: 0.3 10*3/uL (ref 0.0–0.7)
Eosinophils Relative: 3 % (ref 0–5)
HCT: 38.6 % — ABNORMAL LOW (ref 39.0–52.0)
Hemoglobin: 13.6 g/dL (ref 13.0–17.0)
LYMPHS ABS: 1.2 10*3/uL (ref 0.7–4.0)
Lymphocytes Relative: 15 % (ref 12–46)
MCH: 31.2 pg (ref 26.0–34.0)
MCHC: 35.2 g/dL (ref 30.0–36.0)
MCV: 88.5 fL (ref 78.0–100.0)
Monocytes Absolute: 0.5 10*3/uL (ref 0.1–1.0)
Monocytes Relative: 7 % (ref 3–12)
NEUTROS ABS: 5.7 10*3/uL (ref 1.7–7.7)
NEUTROS PCT: 75 % (ref 43–77)
Platelets: 215 10*3/uL (ref 150–400)
RBC: 4.36 MIL/uL (ref 4.22–5.81)
RDW: 14.5 % (ref 11.5–15.5)
WBC: 7.7 10*3/uL (ref 4.0–10.5)

## 2013-07-30 LAB — PRO B NATRIURETIC PEPTIDE: PRO B NATRI PEPTIDE: 604.2 pg/mL — AB (ref 0–125)

## 2013-07-30 LAB — APTT: aPTT: 28 seconds (ref 24–37)

## 2013-07-30 LAB — TROPONIN I
Troponin I: <0.3 ng/mL (ref <0.30)
Troponin I: 0.33 ng/mL (ref <0.30)

## 2013-07-30 LAB — BASIC METABOLIC PANEL
BUN: 20 mg/dL (ref 6–23)
CO2: 29 mEq/L (ref 19–32)
Calcium: 10.1 mg/dL (ref 8.4–10.5)
Chloride: 91 mEq/L — ABNORMAL LOW (ref 96–112)
Creatinine, Ser: 1.29 mg/dL (ref 0.50–1.35)
GFR calc non Af Amer: 57 mL/min — ABNORMAL LOW (ref >90)
GFR, EST AFRICAN AMERICAN: 66 mL/min — AB (ref >90)
Glucose, Bld: 383 mg/dL — ABNORMAL HIGH (ref 70–99)
POTASSIUM: 4.3 meq/L (ref 3.7–5.3)
SODIUM: 134 meq/L — AB (ref 137–147)

## 2013-07-30 LAB — DIGOXIN LEVEL: DIGOXIN LVL: 0.4 ng/mL — AB (ref 0.8–2.0)

## 2013-07-30 MED ORDER — NITROGLYCERIN 0.4 MG SL SUBL: 0.4000 mg | SUBLINGUAL_TABLET | SUBLINGUAL | Status: DC | PRN

## 2013-07-30 MED ORDER — ASPIRIN 81 MG PO CHEW
324.0000 mg | CHEWABLE_TABLET | ORAL | Status: AC
Start: 2013-07-30 — End: 2013-07-31

## 2013-07-30 MED ORDER — ATORVASTATIN CALCIUM 40 MG PO TABS
40.0000 mg | ORAL_TABLET | Freq: Every evening | ORAL | Status: DC
  Administered 2013-07-31: 40 mg via ORAL
  Filled 2013-07-30 (×2): qty 1

## 2013-07-30 MED ORDER — HYDROCODONE-ACETAMINOPHEN 7.5-325 MG PO TABS
1.0000 | ORAL_TABLET | Freq: Four times a day (QID) | ORAL | Status: DC | PRN
  Administered 2013-07-31 – 2013-08-01 (×4): 1 via ORAL
  Filled 2013-07-30 (×4): qty 1

## 2013-07-30 MED ORDER — HEPARIN BOLUS VIA INFUSION
4000.0000 [IU] | Freq: Once | INTRAVENOUS | Status: AC
  Administered 2013-07-30: 4000 [IU] via INTRAVENOUS

## 2013-07-30 MED ORDER — CARVEDILOL 3.125 MG PO TABS
3.1250 mg | ORAL_TABLET | Freq: Two times a day (BID) | ORAL | Status: DC
  Administered 2013-07-31: 3.125 mg via ORAL
  Filled 2013-07-30 (×3): qty 1

## 2013-07-30 MED ORDER — CYCLOBENZAPRINE HCL 10 MG PO TABS
5.0000 mg | ORAL_TABLET | Freq: Three times a day (TID) | ORAL | Status: DC | PRN
  Administered 2013-08-01 (×2): 5 mg via ORAL
  Filled 2013-07-30 (×2): qty 1

## 2013-07-30 MED ORDER — SODIUM CHLORIDE 0.9 % IJ SOLN: 3.0000 mL | INTRAMUSCULAR | Status: DC | PRN

## 2013-07-30 MED ORDER — ASPIRIN 325 MG PO TABS: 325.0000 mg | ORAL_TABLET | Freq: Every day | ORAL | Status: DC

## 2013-07-30 MED ORDER — NITROGLYCERIN 0.4 MG SL SUBL
0.4000 mg | SUBLINGUAL_TABLET | SUBLINGUAL | Status: DC | PRN
  Administered 2013-07-30 (×2): 0.4 mg via SUBLINGUAL

## 2013-07-30 MED ORDER — POTASSIUM CHLORIDE CRYS ER 20 MEQ PO TBCR
20.0000 meq | EXTENDED_RELEASE_TABLET | Freq: Every day | ORAL | Status: DC
  Administered 2013-07-31 – 2013-08-01 (×2): 20 meq via ORAL
  Filled 2013-07-30 (×3): qty 1

## 2013-07-30 MED ORDER — ASPIRIN 300 MG RE SUPP
300.0000 mg | RECTAL | Status: AC
  Filled 2013-07-30: qty 1

## 2013-07-30 MED ORDER — NITROGLYCERIN 2 % TD OINT
1.0000 [in_us] | TOPICAL_OINTMENT | Freq: Once | TRANSDERMAL | Status: AC
  Administered 2013-07-30: 1 [in_us] via TOPICAL
  Filled 2013-07-30: qty 1

## 2013-07-30 MED ORDER — INSULIN ASPART 100 UNIT/ML ~~LOC~~ SOLN: 0.0000 [IU] | SUBCUTANEOUS | Status: DC

## 2013-07-30 MED ORDER — NITROGLYCERIN IN D5W 200-5 MCG/ML-% IV SOLN
2.0000 ug/min | INTRAVENOUS | Status: DC
  Administered 2013-07-30: 5 ug/min via INTRAVENOUS
  Filled 2013-07-30: qty 250

## 2013-07-30 MED ORDER — SODIUM CHLORIDE 0.9 % IV SOLN: 250.0000 mL | INTRAVENOUS | Status: DC | PRN

## 2013-07-30 MED ORDER — FUROSEMIDE 80 MG PO TABS
80.0000 mg | ORAL_TABLET | Freq: Two times a day (BID) | ORAL | Status: DC
  Administered 2013-07-31 – 2013-08-01 (×3): 80 mg via ORAL
  Filled 2013-07-30 (×2): qty 1
  Filled 2013-07-30: qty 2
  Filled 2013-07-30 (×3): qty 1

## 2013-07-30 MED ORDER — SODIUM CHLORIDE 0.9 % IJ SOLN
3.0000 mL | Freq: Two times a day (BID) | INTRAMUSCULAR | Status: DC
  Administered 2013-07-31: 3 mL via INTRAVENOUS

## 2013-07-30 MED ORDER — INSULIN NPH (HUMAN) (ISOPHANE) 100 UNIT/ML ~~LOC~~ SUSP
110.0000 [IU] | Freq: Two times a day (BID) | SUBCUTANEOUS | Status: DC
  Administered 2013-07-31 – 2013-08-01 (×3): 110 [IU] via SUBCUTANEOUS
  Filled 2013-07-30: qty 10

## 2013-07-30 MED ORDER — CLOPIDOGREL BISULFATE 75 MG PO TABS
75.0000 mg | ORAL_TABLET | Freq: Every day | ORAL | Status: DC
  Administered 2013-07-31 – 2013-08-01 (×2): 75 mg via ORAL
  Filled 2013-07-30 (×4): qty 1

## 2013-07-30 MED ORDER — NITROGLYCERIN 0.4 MG SL SUBL
0.4000 mg | SUBLINGUAL_TABLET | SUBLINGUAL | Status: DC | PRN
  Administered 2013-07-31: 0.4 mg via SUBLINGUAL
  Filled 2013-07-30: qty 1

## 2013-07-30 MED ORDER — LISINOPRIL 2.5 MG PO TABS
2.5000 mg | ORAL_TABLET | Freq: Every day | ORAL | Status: DC
Start: 2013-07-31 — End: 2013-08-01
  Administered 2013-07-31 – 2013-08-01 (×2): 2.5 mg via ORAL
  Filled 2013-07-30 (×2): qty 1

## 2013-07-30 MED ORDER — HEPARIN (PORCINE) IN NACL 100-0.45 UNIT/ML-% IJ SOLN
1800.0000 [IU]/h | INTRAMUSCULAR | Status: DC
  Administered 2013-07-30: 1150 [IU]/h via INTRAVENOUS
  Administered 2013-07-31: 1600 [IU]/h via INTRAVENOUS
  Filled 2013-07-30 (×5): qty 250

## 2013-07-30 MED ORDER — ASPIRIN EC 81 MG PO TBEC
81.0000 mg | DELAYED_RELEASE_TABLET | Freq: Every day | ORAL | Status: DC
  Administered 2013-07-31 – 2013-08-01 (×2): 81 mg via ORAL
  Filled 2013-07-30 (×2): qty 1

## 2013-07-30 MED ORDER — SULFAMETHOXAZOLE-TMP DS 800-160 MG PO TABS
1.0000 | ORAL_TABLET | Freq: Two times a day (BID) | ORAL | Status: DC
  Administered 2013-07-31 – 2013-08-01 (×3): 1 via ORAL
  Filled 2013-07-30 (×5): qty 1

## 2013-07-30 MED ORDER — VITAMIN C 500 MG PO TABS
1000.0000 mg | ORAL_TABLET | Freq: Every day | ORAL | Status: DC
  Administered 2013-07-31: 1000 mg via ORAL
  Filled 2013-07-30 (×3): qty 2

## 2013-07-30 MED ORDER — DIGOXIN 250 MCG PO TABS
0.2500 mg | ORAL_TABLET | Freq: Every day | ORAL | Status: DC
  Administered 2013-07-31 – 2013-08-01 (×2): 0.25 mg via ORAL
  Filled 2013-07-30 (×2): qty 1

## 2013-07-30 MED ORDER — NITROGLYCERIN 0.4 MG SL SUBL
SUBLINGUAL_TABLET | SUBLINGUAL | Status: AC
  Administered 2013-07-30: 15:00:00 0.4 mg via SUBLINGUAL
  Filled 2013-07-30: qty 2.5

## 2013-07-30 NOTE — Progress Notes (Signed)
ANTICOAGULATION CONSULT NOTE - Initial Consult  Pharmacy Consult for Heparin Indication: chest pain/ACS  Allergies  Allergen Reactions  . Baycol [Cerivastatin]   . Stadol [Butorphanol Tartrate] Other (See Comments)    "makes him crazy"    Patient Measurements: Height: 6\' 3"  (190.5 cm) Weight: 260 lb (117.935 kg) IBW/kg (Calculated) : 84.5 Heparin Dosing Weight: 98Kg  Vital Signs: Temp: 98.3 F (36.8 C) (04/24 1932) Temp src: Oral (04/24 1932) BP: 132/76 mmHg (04/24 1800) Pulse Rate: 113 (04/24 1932)  Labs:  Recent Labs  07/30/13 1510 07/30/13 1523 07/30/13 1804  HGB 13.6  --   --   HCT 38.6*  --   --   PLT 215  --   --   APTT  --  28  --   LABPROT  --  12.6  --   INR  --  0.96  --   CREATININE 1.29  --   --   TROPONINI <0.30  --  0.33*    Estimated Creatinine Clearance: 80.1 ml/min (by C-G formula based on Cr of 1.29).   Medical History: Past Medical History  Diagnosis Date  . Uncontrolled diabetes mellitus     a. A1C 12.8 in 01/2013.  [name]. Chronic systolic heart failure   . Obesity   . Hyperlipidemia   . Peripheral vascular disease   . Hypertension   . Asthma   . CAD (coronary artery disease)     a. Prior CABG 20 yrs ago (?~1994). b. Hx of multiple stents, [place]. c[place]: cath moderate disease in LAD, distal LCx, prox RCA occluded, 2 SVGs occluded, felt to be stable from prior cath -> for medical therapy.  . Ischemic cardiomyopathy     a. EF 2012: 55-60. b. EF 12/2011: 25-30%. c. Echo 01/2013: EF 15-20%.  . Stroke     15 years ago  . Arthritis   . Anxiety   . Depression   . 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 Chronic respiratory failure     a. Due to COPD.  . Osteomyelitis 2013    was planned for chronic suppressive antibiotics but cannot afford this any longer  . DNR no code (do not resuscitate)     Medications:   (Not in a hospital admission)  Assessment: 63yo  male presented to ED c/o chest pain.  Asked to initiate Heparin per protocol for ACS.  Pt is obese.    Goal of Therapy:  Heparin level 0.3-0.7 units/ml Monitor platelets by anticoagulation protocol: Yes   Plan:  Heparin 4000 unit bolus now x 1 Heparin infusion at 12 units/Kg/Hr (adj BW) Heparin level in 6-8 hours then daily CBC daily while on Heparin  Prithvi Kooi A Izel Eisenhardt 07/30/2013,7:43 PM

## 2013-07-30 NOTE — ED Provider Notes (Signed)
CSN: [id number]     Arrival date & time 07/30/13  1440 History  This chart was scribed for Ward Givens, MD by Dorothey Baseman, ED Scribe. This patient was seen in room APA06/APA06 and the patient's care was started at 3:13 PM.    Chief Complaint  Patient presents with  . Chest Pain   The history is provided by the patient and the EMS personnel. No language interpreter was used.   HPI Comments: Timothy Trujillo is a 63 y.o. 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 Patient describes a burning pain initially, but states that the pain became a heaviness/tightness when it returned around 5 hours ago. He reports taking an additional 2 doses of nitroglycerin, last dose around 45 minutes ago, with mild relief. He states that the pain has since begun to radiate to the left shoulder and left side of the neck and jaw. EMS reports that the patient had 2 brief syncopal episodes while en route to the ED. Patient reports some associated lightheadedness currently. Patient reports that his current symptoms feel similar to his past MI.  EMS also administered 325 mg aspirin en route without significant relief. Patient received nitroglycerin upon arrival to the ED, which he states has been able to provide a mild amount of relief. Patient has a surgical history of CABG and coronary stent placement (last was around 8-10 years ago). He denies nausea, leg swelling. Patient was seen by Dr. Orvan Falconer about a month ago on 07/06/2103 and started on a course of Septra DS to treat osteomyelitis around the knee. Patient  has allergies to Baycol and Stadol. Patient also has a history of uncontrolled DM, peripheral vascular disease, HTN, hyperlipidemia, asthma, stroke, arthritis, anxiety, and osteomyelitis. Patient is a former smoker.   Cardiologist- Dr. Wyline Mood PCP- Dr. Juanetta Gosling  Past Medical History  Diagnosis Date  . Uncontrolled diabetes mellitus     a. A1C 12.8 in 01/2013.  [name] Chronic systolic heart failure   . Obesity   . Hyperlipidemia   . Peripheral vascular disease   . Hypertension   . Asthma   . CAD (coronary artery disease)     a. Prior CABG 20 yrs ago (?~1994). b. Hx of multiple stents, prev followed in [place]. c. [place] 01/2013: cath moderate disease in LAD, distal LCx, prox RCA occluded, 2 SVGs occluded, felt to be stable from prior cath -> for medical therapy.  . Ischemic cardiomyopathy     a. EF 2012: 55-60. b. EF 12/2011: 25-30%. c. Echo 01/2013: EF 15-20%.  . Stroke     15 years ago  . Arthritis   . Anxiety   . Depression   . 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 Chronic respiratory failure     a. Due to COPD.  . Osteomyelitis 2013    was planned for chronic suppressive antibiotics but cannot afford this any longer  . DNR no code (do not resuscitate)    Past Surgical History  Procedure Laterality Date  . Coronary artery bypass graft    .  Coronary stent placement  2008    CABG grafts closed  . Joint replacement    . Cholecystectomy    . Eye surgery    . I&d extremity  06/12/2011    Procedure: IRRIGATION AND DEBRIDEMENT EXTREMITY;  Surgeon: Javier DockerJeffrey C Beane, MD;  Location: [facility] OR;  Service: Orthopedics;  Laterality: Right;  I&D Right Tibia   Family History  Problem Relation Age of Onset  . Dementia Mother   . Arthritis Mother   . Heart disease Father   . Heart disease Brother    History  Substance Use Topics  . Smoking status: Former Smoker -- 3.00 packs/day    Quit date: 04/09/1983  . Smokeless tobacco: Never Used  .  Alcohol Use: No  lives at home Lives alone  Oxygen 3 lpm [place]  Review of Systems  Constitutional: Positive for diaphoresis.  Respiratory: Positive for chest tightness and shortness of breath.   Cardiovascular: Positive for chest pain. Negative for leg swelling.  Gastrointestinal: Negative for nausea.  Neurological: Positive for syncope and light-headedness.  All other systems reviewed and are negative.     Allergies  Baycol and Stadol  Home Medications   Prior to Admission medications   Medication Sig Start Date End Date Taking? Authorizing Provider  Ascorbic Acid (VITAMIN C) 1000 MG tablet Take 1,000 mg by mouth daily.    Historical Provider, MD  aspirin 325 MG tablet Take 325 mg by mouth daily. *takes one tablet daily for heart health. Also takes additional one to two tablets as needed for arthritis pain associated with shoulder pain*    Historical Provider, MD  atorvastatin (LIPITOR) 40 MG tablet Take 40 mg by mouth every evening.    Historical Provider, MD  carvedilol (COREG) 3.125 MG tablet Take 1 tablet (3.125 mg total) by mouth 2 (two) times daily with a meal. 04/22/13   Fredirick MaudlinEdward L Hawkins, MD  clopidogrel (PLAVIX) 75 MG tablet Take 75 mg by mouth daily. 06/14/13   Historical Provider, MD  cyanocobalamin 500 MCG tablet Take 500 mcg by mouth daily.    Historical Provider, MD  cyclobenzaprine (FLEXERIL) 5 MG tablet Take 5 mg by mouth 3 (three) times daily as needed for muscle spasms.    Historical Provider, MD  digoxin (LANOXIN) 0.25 MG tablet Take 1 tablet (0.25 mg total) by mouth daily. 04/22/13   Fredirick MaudlinEdward L Hawkins, MD  furosemide (LASIX) 80 MG tablet Take 80 mg by mouth 2 (two) times daily.    Historical Provider, MD  insulin NPH (HUMULIN N,NOVOLIN N) 100 UNIT/ML injection Inject 110 Units into the skin 2 (two) times daily.    Historical Provider, MD  isosorbide dinitrate (ISORDIL) 20 MG tablet Take 1 tablet (20 mg total) by mouth 3 (three) times daily. 04/22/13   Fredirick MaudlinEdward L Hawkins, MD   KLOR-CON M20 20 MEQ tablet Take 20 mEq by mouth daily.  01/07/13   Historical Provider, MD  lisinopril (PRINIVIL,ZESTRIL) 2.5 MG tablet Take 1 tablet (2.5 mg total) by mouth daily. 01/18/13   Dayna N Dunn, PA-C  Magnesium 250 MG TABS Take 250 tablets by mouth daily.    Historical Provider, MD  nitroGLYCERIN (NITROSTAT) 0.4 MG SL tablet Place 0.4 mg under the tongue every 5 (five) minutes as needed for chest pain.     Historical Provider, MD  Nystatin Domestic POWD Apply powder to rash twice daily 07/05/13   Cliffton AstersJohn Campbell, MD  oxyCODONE-acetaminophen (PERCOCET/ROXICET) 5-325 MG per tablet Take 1 tablet by mouth 4 (four) times daily  as needed for moderate pain or severe pain.    Historical Provider, MD  sulfamethoxazole-trimethoprim (BACTRIM DS,SEPTRA DS) 800-160 MG per tablet Take 1 tablet by mouth 2 (two) times daily. 07/05/13   Cliffton Asters, MD  vitamin E 400 UNIT capsule Take 400 Units by mouth daily.    Historical Provider, MD   Triage Vitals: BP 132/82  Pulse 104  Temp(Src) 98 F (36.7 C) (Oral)  Resp 18  Ht 6\' 3"  (1.905 m)  Wt 260 lb (117.935 kg)  BMI 32.50 kg/m2  SpO2 94%  Vital signs normal except tachycardia   Physical Exam  Nursing note and vitals reviewed. Constitutional: He is oriented to person, place, and time. He appears well-developed and well-nourished.  Non-toxic appearance. He does not appear ill. No distress.  HENT:  Head: Normocephalic and atraumatic.  Right Ear: External ear normal.  Left Ear: External ear normal.  Nose: Nose normal. No mucosal edema or rhinorrhea.  Mouth/Throat: Oropharynx is clear and moist and mucous membranes are normal. No dental abscesses or uvula swelling.  Eyes: Conjunctivae and EOM are normal. Pupils are equal, round, and reactive to light.  Neck: Normal range of motion and full passive range of motion without pain. Neck supple.  Cardiovascular: Regular rhythm and normal heart sounds.  Tachycardia present.  Exam reveals no gallop and no  friction rub.   No murmur heard. Tachycardic.   Pulmonary/Chest: Effort normal and breath sounds normal. No respiratory distress. He has no wheezes. He has no rhonchi. He has no rales. He exhibits no tenderness and no crepitus.  Abdominal: Soft. Normal appearance and bowel sounds are normal. He exhibits no distension. There is no tenderness. There is no rebound and no guarding.  Musculoskeletal: Normal range of motion. He exhibits no edema and no tenderness.  Moves all extremities well.   Neurological: He is alert and oriented to person, place, and time. He has normal strength. No cranial nerve deficit.  Skin: Skin is warm, dry and intact. No rash noted. No erythema. No pallor.  Psychiatric: He has a normal mood and affect. His speech is normal and behavior is normal. His mood appears not anxious.    ED Course  Procedures (including critical care time)  Medications  nitroGLYCERIN (NITROSTAT) SL tablet 0.4 mg (0.4 mg Sublingual Given 07/30/13 1607)  nitroGLYCERIN 0.2 mg/mL in dextrose 5 % infusion (5 mcg/min Intravenous New Bag/Given 07/30/13 1935)  heparin ADULT infusion 100 units/mL (25000 units/250 mL) (1,150 Units/hr Intravenous New Bag/Given 07/30/13 2002)  nitroGLYCERIN (NITROGLYN) 2 % ointment 1 inch (1 inch Topical Given 07/30/13 1534)  heparin bolus via infusion 4,000 Units (0 Units Intravenous Stopped 07/30/13 2005)   DIAGNOSTIC STUDIES: Oxygen Saturation is 94% on room air, adequate by my interpretation.    COORDINATION OF CARE: 2:50 PM- Ordered EKG, CBC, BMP, troponin, and a chest x-ray. Ordered nitroglycerin to manage symptoms.   3:23 PM- Ordered digoxin level, APTT, Protime-INR, BNP. Discussed treatment plan with patient at bedside and patient verbalized agreement.   5:32 PM- Reviewed patient's past medical records. Patient's last cardiac catheterization was October, 2014. LAD has mild-moderate irregularities. Distal left circumflex is occluded and proximal RCA is occluded. Both  SVGs are occluded. LM is 60%. Thought medical management was indicated. He was started on Imdur  5:59 PM- Patient reports feeling somewhat better after receiving the medications. Blood pressure at recheck is 132/76. Discussed that first troponin was negative. Will order an additional troponin. Will consult with the patient's cardiologist. Discussed treatment plan with  patient at bedside and patient verbalized agreement.   7:17 PM- Consulted with cardiologist (Dr. Royann Shiversroitoru) at [facility], who will accept the patient in transfer to step-down. Will start patient on a nitroglycerin and heparin drip.   7:25 PM- Discussed that the patient's second troponin was a bit elevated. Discussed that the patient will be transferred and admitted to [facility]. Discussed treatment plan with patient at bedside and patient verbalized agreement.   Results for orders placed during the hospital encounter of 07/30/13  CBC WITH DIFFERENTIAL      Result Value Ref Range   WBC 7.7  4.0 - 10.5 K/uL   RBC 4.36  4.22 - 5.81 MIL/uL   Hemoglobin 13.6  13.0 - 17.0 g/dL   HCT 40.938.6 (*) 81.139.0 - 91.452.0 %   MCV 88.5  78.0 - 100.0 fL   MCH 31.2  26.0 - 34.0 pg   MCHC 35.2  30.0 - 36.0 g/dL   RDW 78.214.5  95.611.5 - 21.315.5 %   Platelets 215  150 - 400 K/uL   Neutrophils Relative % 75  43 - 77 %   Neutro Abs 5.7  1.7 - 7.7 K/uL   Lymphocytes Relative 15  12 - 46 %   Lymphs Abs 1.2  0.7 - 4.0 K/uL   Monocytes Relative 7  3 - 12 %   Monocytes Absolute 0.5  0.1 - 1.0 K/uL   Eosinophils Relative 3  0 - 5 %   Eosinophils Absolute 0.3  0.0 - 0.7 K/uL   Basophils Relative 0  0 - 1 %   Basophils Absolute 0.0  0.0 - 0.1 K/uL  BASIC METABOLIC PANEL      Result Value Ref Range   Sodium 134 (*) 137 - 147 mEq/L   Potassium 4.3  3.7 - 5.3 mEq/L   Chloride 91 (*) 96 - 112 mEq/L   CO2 29  19 - 32 mEq/L   Glucose, Bld 383 (*) 70 - 99 mg/dL   BUN 20  6 - 23 mg/dL   Creatinine, Ser 0.861.29  0.50 - 1.35 mg/dL   Calcium 57.810.1  8.4 - 46.910.5 mg/dL   GFR calc  non Af Amer 57 (*) >90 mL/min   GFR calc Af Amer 66 (*) >90 mL/min  TROPONIN I      Result Value Ref Range   Troponin I <0.30  <0.30 ng/mL  PRO B NATRIURETIC PEPTIDE      Result Value Ref Range   Pro B Natriuretic peptide (BNP) 604.2 (*) 0 - 125 pg/mL  DIGOXIN LEVEL      Result Value Ref Range   Digoxin Level 0.4 (*) 0.8 - 2.0 ng/mL  APTT      Result Value Ref Range   aPTT 28  24 - 37 seconds  PROTIME-INR      Result Value Ref Range   Prothrombin Time 12.6  11.6 - 15.2 seconds   INR 0.96  0.00 - 1.49  TROPONIN I      Result Value Ref Range   Troponin I 0.33 (*) <0.30 ng/mL    Laboratory interpretation all normal except second troponin positive, elevated BNP but lower than his baseline in the low thousands  Dg Chest Portable 1 View  07/30/2013   CLINICAL DATA:  Chest pain  EXAM: PORTABLE CHEST - 1 VIEW  COMPARISON:  04/19/2013  FINDINGS: Cardiac shadow is stable. Diffuse interstitial changes are noted bilaterally stable from the prior exam. These are likely chronic in etiology. Postsurgical  changes are again noted. No other focal abnormality is seen.  IMPRESSION: Chronic changes bilaterally.   Electronically Signed   By: Alcide Clever M.D.   On: 07/30/2013 15:14    EKG Interpretation   Date/Time:  Friday July 30 2013 14:45:46 EDT Ventricular Rate:  107 PR Interval:  168 QRS Duration: 110 QT Interval:  346 QTC Calculation: 461 R Axis:   26 Text Interpretation:  Sinus tachycardia Possible Left atrial enlargement  Inferior infarct (cited on or before 08-Jan-2013) Anterolateral infarct  (cited on or before 08-Jan-2013) When compared with ECG of 21-Apr-2013  08:33, Premature atrial complexes are no longer Present No significant  change since last tracing Confirmed by Nickolas Chalfin  MD-I, Peter Daquila ([id number]) on  07/30/2013 3:38:30 PM      MDM   Final diagnoses:  Chest pain  Abnormal cardiac enzyme level  Unstable angina   Plan transfer to [facility] for admission   Devoria Albe, MD,  FACEP   CRITICAL CARE Performed by: Vallory Oetken L Alyce Inscore Total critical care time: 34 min Critical care time was exclusive of separately billable procedures and treating other patients. Critical care was necessary to treat or prevent imminent or life-threatening deterioration. Critical care was time spent personally by me on the following activities: development of treatment plan with patient and/or surrogate as well as nursing, discussions with consultants, evaluation of patient's response to treatment, examination of patient, obtaining history from patient or surrogate, ordering and performing treatments and interventions, ordering and review of laboratory studies, ordering and review of radiographic studies, pulse oximetry and re-evaluation of patient's condition.    I personally performed the services described in this documentation, which was scribed in my presence. The recorded information has been reviewed and considered.  Devoria Albe, MD, Armando Gang     Ward Givens, MD 07/30/13 2037

## 2013-07-30 NOTE — H&P (Signed)
Timothy Trujillo is an 63 y.o. male.     Chief Complaint: Chest Pain/Unstable Angina Primary Cardiologist: Dr. 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 He's been taking more NTG today than usual with some relief - he typically takes 1-2 SL tabs 5-6 days weekly. Today he has taken 5-6 before calling 911. He also has had some associated left shoulder and neck/jaw pain. According to the EMS reports from [facility], he had questionable brief syncopal episodes en route to the ER but he tells me he gets lightheaded going from sitting to standing ever since he was diagnosed with silicosis. He received aspirin 325 mg and was started on heparin gtt and NTG gtt and transferred to [facility]. His pain settled before arriving at [facility] and he is currently chest pain free. He follows with Dr. Harl Bowie who has noted from previous [place] that left main is 60%, with irregularities in the LAD, occluded distal LCx and occluded proximal RCA with SVG x2 occluded with preference for medical management. We discussed his situation and our preference for medical therapy. He's agreeable. We also discussed the DNR in his chart - he initially said he would consider being on a breathing machine but he was adamant about not receiving a shock. He is deathly afraid of being suffocated or having that sensation - he had a traumatic event where something fell on him and it took some time for it to be removed. He does not want to be resuscitated should he have a cardiac or respiratory arrest. He does want full treatment otherwise. He will remain DNR in the chart.      Past Medical History  Diagnosis Date  . Uncontrolled diabetes mellitus     a. A1C 12.8 in 01/2013.  [name] Chronic systolic heart failure   .  Obesity   . Hyperlipidemia   . Peripheral vascular disease   . Hypertension   . Asthma   . CAD (coronary artery disease)     a. Prior CABG 20 yrs ago (?~1994). b. Hx of multiple stents, prev followed in [place]. c. [place] 01/2013: cath moderate disease in LAD, distal LCx, prox RCA occluded, 2 SVGs occluded, felt to be stable from prior cath -> for medical therapy.  . Ischemic cardiomyopathy     a. EF 2012: 55-60. b. EF 12/2011: 25-30%. c. Echo 01/2013: EF 15-20%.  . Stroke     15 years ago  . Arthritis   . Anxiety   . Depression   . 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 Chronic respiratory failure     a. Due to COPD.  . Osteomyelitis 2013    was planned for chronic suppressive antibiotics but cannot afford this any longer  . DNR no code (do not resuscitate)     Past Surgical History  Procedure Laterality Date  . Coronary artery bypass graft    . Coronary stent placement  2008    CABG grafts closed  . Joint replacement    . Cholecystectomy    . Eye surgery    . I&d extremity  06/12/2011    Procedure: IRRIGATION AND DEBRIDEMENT EXTREMITY;  Surgeon: Johnn Hai, MD;  Location: [place];  Service:  Orthopedics;  Laterality: Right;  I&D Right [place]    Family History  Problem Relation Age of Onset  . Dementia Mother   . Arthritis Mother   . Heart disease Father   . Heart disease Brother    Social History:  reports that he quit smoking about 30 years ago. He has never used smokeless tobacco. He reports that he does not drink alcohol or use illicit drugs.  Allergies:  Allergies  Allergen Reactions  . Baycol [Cerivastatin]   . Stadol [Butorphanol Tartrate] Other (See Comments)    "makes him crazy"    Medications Prior to Admission  Medication Sig Dispense Refill  . aspirin 325 MG tablet Take 325 mg by mouth daily. *takes one tablet daily for heart health. Also takes additional one to two tablets as needed for arthritis pain  associated with shoulder pain*      . Aspirin-Salicylamide-Caffeine (BC FAST PAIN RELIEF ARTHRITIS) 740-814-48 MG PACK Take 1 packet by mouth 3 (three) times daily as needed (for pain).      [name] atorvastatin (LIPITOR) 40 MG tablet Take 40 mg by mouth every evening.      . carvedilol (COREG) 3.125 MG tablet Take 1 tablet (3.125 mg total) by mouth 2 (two) times daily with a meal.  60 tablet  12  . clopidogrel (PLAVIX) 75 MG tablet Take 75 mg by mouth daily.      . cyclobenzaprine (FLEXERIL) 5 MG tablet Take 5 mg by mouth 3 (three) times daily as needed for muscle spasms.      . digoxin (LANOXIN) 0.25 MG tablet Take 1 tablet (0.25 mg total) by mouth daily.  30 tablet  12  . furosemide (LASIX) 80 MG tablet Take 80 mg by mouth 2 (two) times daily.      [name] HYDROcodone-acetaminophen (NORCO) 7.5-325 MG per tablet Take 1 tablet by mouth every 6 (six) hours as needed for moderate pain (pain in leg and shoulder).      . insulin NPH (HUMULIN N,NOVOLIN N) 100 UNIT/ML injection Inject 110 Units into the skin 2 (two) times daily.      . isosorbide dinitrate (ISORDIL) 20 MG tablet Take 1 tablet (20 mg total) by mouth 3 (three) times daily.  90 tablet  12  . KLOR-CON M20 20 MEQ tablet Take 20 mEq by mouth daily.       [name] lisinopril (PRINIVIL,ZESTRIL) 2.5 MG tablet Take 1 tablet (2.5 mg total) by mouth daily.  30 tablet  6  . Magnesium 250 MG TABS Take 250 tablets by mouth at bedtime.       . Methylcobalamin (B-12) 5000 MCG TBDP Take 1 tablet by mouth at bedtime.      . nitroGLYCERIN (NITROSTAT) 0.4 MG SL tablet Place 0.4 mg under the tongue every 5 (five) minutes as needed for chest pain.       [name] oxyCODONE-acetaminophen (PERCOCET/ROXICET) 5-325 MG per tablet Take 1 tablet by mouth 4 (four) times daily as needed for moderate pain or severe pain (for knee and shoulder).       [name] sulfamethoxazole-trimethoprim (BACTRIM DS,SEPTRA DS) 800-160 MG per tablet Take 1 tablet by mouth 2 (two) times daily.  60 tablet  11  . vitamin  C (ASCORBIC ACID) 500 MG tablet Take 1,000 mg by mouth at bedtime.      . vitamin E 400 UNIT capsule Take 400 Units by mouth at bedtime.       [name] Nystatin Domestic POWD Apply powder to rash twice daily  1 each  11    Results for orders placed during the hospital encounter of 07/30/13 (from the past 48 hour(s))  CBC WITH DIFFERENTIAL     Status: Abnormal   Collection Time    07/30/13  3:10 PM      Result Value Ref Range   WBC 7.7  4.0 - 10.5 K/uL   RBC 4.36  4.22 - 5.81 MIL/uL   Hemoglobin 13.6  13.0 - 17.0 g/dL   HCT 38.6 (*) 39.0 - 52.0 %   MCV 88.5  78.0 - 100.0 fL   MCH 31.2  26.0 - 34.0 pg   MCHC 35.2  30.0 - 36.0 g/dL   RDW 14.5  11.5 - 15.5 %   Platelets 215  150 - 400 K/uL   Neutrophils Relative % 75  43 - 77 %   Neutro Abs 5.7  1.7 - 7.7 K/uL   Lymphocytes Relative 15  12 - 46 %   Lymphs Abs 1.2  0.7 - 4.0 K/uL   Monocytes Relative 7  3 - 12 %   Monocytes Absolute 0.5  0.1 - 1.0 K/uL   Eosinophils Relative 3  0 - 5 %   Eosinophils Absolute 0.3  0.0 - 0.7 K/uL   Basophils Relative 0  0 - 1 %   Basophils Absolute 0.0  0.0 - 0.1 K/uL  BASIC METABOLIC PANEL     Status: Abnormal   Collection Time    07/30/13  3:10 PM      Result Value Ref Range   Sodium 134 (*) 137 - 147 mEq/L   Potassium 4.3  3.7 - 5.3 mEq/L   Chloride 91 (*) 96 - 112 mEq/L   CO2 29  19 - 32 mEq/L   Glucose, Bld 383 (*) 70 - 99 mg/dL   BUN 20  6 - 23 mg/dL   Creatinine, Ser 1.29  0.50 - 1.35 mg/dL   Calcium 10.1  8.4 - 10.5 mg/dL   GFR calc non Af Amer 57 (*) >90 mL/min   GFR calc Af Amer 66 (*) >90 mL/min   Comment: (NOTE)     The eGFR has been calculated using the CKD EPI equation.     This calculation has not been validated in all clinical situations.     eGFR's persistently <90 mL/min signify possible Chronic Kidney     Disease.  TROPONIN I     Status: None   Collection Time    07/30/13  3:10 PM      Result Value Ref Range   Troponin I <0.30  <0.30 ng/mL   Comment:            Due to the  release kinetics of cTnI,     a negative result within the first hours     of the onset of symptoms does not rule out     myocardial infarction with certainty.     If myocardial infarction is still suspected,     repeat the test at appropriate intervals.  PRO B NATRIURETIC PEPTIDE     Status: Abnormal   Collection Time    07/30/13  3:23 PM      Result Value Ref Range   Pro B Natriuretic peptide (BNP) 604.2 (*) 0 - 125 pg/mL  DIGOXIN LEVEL     Status: Abnormal   Collection Time    07/30/13  3:23 PM      Result Value Ref Range   Digoxin Level 0.4 (*) 0.8 -  2.0 ng/mL  APTT     Status: None   Collection Time    07/30/13  3:23 PM      Result Value Ref Range   aPTT 28  24 - 37 seconds  PROTIME-INR     Status: None   Collection Time    07/30/13  3:23 PM      Result Value Ref Range   Prothrombin Time 12.6  11.6 - 15.2 seconds   INR 0.96  0.00 - 1.49  TROPONIN I     Status: Abnormal   Collection Time    07/30/13  6:04 PM      Result Value Ref Range   Troponin I 0.33 (*) <0.30 ng/mL   Comment:            Due to the release kinetics of cTnI,     a negative result within the first hours     of the onset of symptoms does not rule out     myocardial infarction with certainty.     If myocardial infarction is still suspected,     repeat the test at appropriate intervals.     CRITICAL RESULT CALLED TO, READ BACK BY AND VERIFIED WITH:     C.BELTON AT 2725 ON 07/30/13 BY S.VANHOORNE   Dg Chest Portable 1 View  07/30/2013   CLINICAL DATA:  Chest pain  EXAM: PORTABLE CHEST - 1 VIEW  COMPARISON:  04/19/2013  FINDINGS: Cardiac shadow is stable. Diffuse interstitial changes are noted bilaterally stable from the prior exam. These are likely chronic in etiology. Postsurgical changes are again noted. No other focal abnormality is seen.  IMPRESSION: Chronic changes bilaterally.   Electronically Signed   By: Inez Catalina M.D.   On: 07/30/2013 15:14    Review of Systems  Constitutional: Positive for  chills, weight loss and diaphoresis. Negative for fever and malaise/fatigue.  HENT: Negative for tinnitus.   Eyes: Negative for double vision and discharge.  Respiratory: Positive for shortness of breath. Negative for cough and hemoptysis.   Cardiovascular: Positive for chest pain. Negative for palpitations, claudication and leg swelling.  Gastrointestinal: Positive for constipation. Negative for nausea, vomiting and blood in stool.  Genitourinary: Negative for dysuria and hematuria.  Musculoskeletal: Positive for joint pain. Negative for back pain and myalgias.  Skin: Negative for rash.  Neurological: Positive for dizziness and weakness. Negative for tingling, tremors, seizures and headaches.  Endo/Heme/Allergies: Negative for polydipsia.  Psychiatric/Behavioral: Negative for suicidal ideas and hallucinations.    Blood pressure 138/86, pulse 111, temperature 97.1 F (36.2 C), temperature source Oral, resp. rate 21, height 6' 3"  (1.905 m), weight 120.6 kg (265 lb 14 oz), SpO2 96.00%. Physical Exam  Nursing note and vitals reviewed. Constitutional: He is oriented to person, place, and time. He appears well-developed and well-nourished. No distress.  HENT:  Head: Normocephalic and atraumatic.  Nose: Nose normal.  Mouth/Throat: Oropharynx is clear and moist. No oropharyngeal exudate.  Eyes: Conjunctivae and EOM are normal. Pupils are equal, round, and reactive to light. No scleral icterus.  Neck: Normal range of motion. Neck supple. No JVD present. No tracheal deviation present.  Cardiovascular: Regular rhythm, normal heart sounds and intact distal pulses.   No murmur heard. tachycardic  Respiratory: Effort normal. No respiratory distress. He has no wheezes. He has rales.  Fine bibasilar rales  GI: Soft. Bowel sounds are normal. He exhibits no distension. There is no tenderness. There is no rebound.  Musculoskeletal: Normal range of motion. He exhibits  no edema and no tenderness.    Neurological: He is alert and oriented to person, place, and time. No cranial nerve deficit.  Skin: Skin is warm and dry. No rash noted. He is not diaphoretic. No erythema.  Psychiatric: His behavior is normal. Judgment and thought content normal.    Labs reviewed; Cr 1.3, bun 20, na 134/K 4.3, h/h 13.6/38.6, ProBNP 604, trop 0.33 CXR: stable but bilateral interstitial disease, prior CABG EKG: ST, old inferior/AL infarct  Problem List Unstable Angina/Chest Pain Significant coronary artery disease, prior CABG 20 years previously Ischemic Cardiomyopathy with Chronic systolic HF, last known EF 20% Severe COPD on home oxygen  Prior Osteomyelitis of right knee  Sinus tachycardia  T2DM Hyperglycemia  Assessment/Plan 63 yo man with PMH of multi-vessel CAD, CABG 20 years ago, ischemic cardiomyopathy with chronic systolic HF, last known EF 20%, severe COPD on home oxygen, sinus tachycardia, T2DM transferred to [facility] with unstable angina. Differential is musculoskeletal, esophageal spasm, GERD, among other etiologies. However, sounds typical angina, ultimately resolved with IV NTG gtt. Given poor surgical candidacy and likely not percutaneous revascularization targets will aim to maximize medical therapy. The team will review cath films as well to consider revascularization options. For now, IV NTG gtt, heparin gtt.  - stepdown - IV NTG gtt, heparin gtt - review cath films in AM; consider increased dose of imdur; could also consider Ranexa; he's on bb, long-acting nitrates. Could potentially increase BB if he tolerates - continue home medications: coreg 3.125 mg bid, lisinopril 2.5 mg, lasix 80 mg bid - continue atorvastatin 40 mg daily, asa 325 mg daily, plavix 75 mg daily - insulin 110 NPH bid; insulin sliding scale also - TSH, hba1c - DNR per his request and previously in chart; I personally confirmed his preference; he still wishes full treatment   Jules Husbands 07/30/2013, 10:30  PM

## 2013-07-30 NOTE — ED Notes (Signed)
Chest pain at 6 am this morning.  Relieved with ntg.  Pain back at 10 am and has taken two more ntg.  Per ems pt had 2 syncopal episodes in route in which he remained NSR.  Pt rates pain a 8 /10 now radiating to left shoulder and left jaw.  Had 325 aspirin with ems.

## 2013-07-30 NOTE — ED Notes (Signed)
CRITICAL VALUE ALERT  Critical value received:  Troponin 0.33  Date of notification:  07/30/13  Time of notification:  1850  Critical value read back:yes  Nurse who received alert:  Bronson CurbKristy Jakobi Thetford, RN  MD notified (1st page):  Dr. Lynelle DoctorKnapp  Time of first page:  [phone]  MD notified (2nd page):  Time of second page:  Responding MD:  Dr Lynelle DoctorKnapp  Time MD responded:  [phone]

## 2013-07-31 DIAGNOSIS — I2589 Other forms of chronic ischemic heart disease: Secondary | ICD-10-CM

## 2013-07-31 DIAGNOSIS — R079 Chest pain, unspecified: Secondary | ICD-10-CM

## 2013-07-31 DIAGNOSIS — Z66 Do not resuscitate: Secondary | ICD-10-CM

## 2013-07-31 DIAGNOSIS — I2789 Other specified pulmonary heart diseases: Secondary | ICD-10-CM

## 2013-07-31 DIAGNOSIS — R748 Abnormal levels of other serum enzymes: Secondary | ICD-10-CM

## 2013-07-31 DIAGNOSIS — E119 Type 2 diabetes mellitus without complications: Secondary | ICD-10-CM

## 2013-07-31 DIAGNOSIS — J628 Pneumoconiosis due to other dust containing silica: Secondary | ICD-10-CM

## 2013-07-31 DIAGNOSIS — I700 Atherosclerosis of aorta: Secondary | ICD-10-CM | POA: Diagnosis present

## 2013-07-31 DIAGNOSIS — E669 Obesity, unspecified: Secondary | ICD-10-CM

## 2013-07-31 DIAGNOSIS — I519 Heart disease, unspecified: Secondary | ICD-10-CM

## 2013-07-31 DIAGNOSIS — IMO0002 Reserved for concepts with insufficient information to code with codable children: Secondary | ICD-10-CM | POA: Diagnosis present

## 2013-07-31 DIAGNOSIS — J449 Chronic obstructive pulmonary disease, unspecified: Secondary | ICD-10-CM

## 2013-07-31 LAB — BASIC METABOLIC PANEL
BUN: 17 mg/dL (ref 6–23)
CALCIUM: 9.9 mg/dL (ref 8.4–10.5)
CO2: 24 mEq/L (ref 19–32)
Chloride: 97 mEq/L (ref 96–112)
Creatinine, Ser: 1.11 mg/dL (ref 0.50–1.35)
GFR calc Af Amer: 79 mL/min — ABNORMAL LOW (ref >90)
GFR, EST NON AFRICAN AMERICAN: 68 mL/min — AB (ref >90)
Glucose, Bld: 324 mg/dL — ABNORMAL HIGH (ref 70–99)
Potassium: 4.2 mEq/L (ref 3.7–5.3)
Sodium: 137 mEq/L (ref 137–147)

## 2013-07-31 LAB — TROPONIN I
Troponin I: <0.3 ng/mL (ref <0.30)
Troponin I: <0.3 ng/mL (ref <0.30)

## 2013-07-31 LAB — TSH: TSH: 1.46 u[IU]/mL (ref 0.350–4.500)

## 2013-07-31 LAB — GLUCOSE, CAPILLARY
GLUCOSE-CAPILLARY: 283 mg/dL — AB (ref 70–99)
Glucose-Capillary: 307 mg/dL — ABNORMAL HIGH (ref 70–99)
Glucose-Capillary: 308 mg/dL — ABNORMAL HIGH (ref 70–99)
Glucose-Capillary: 221 mg/dL — ABNORMAL HIGH (ref 70–99)

## 2013-07-31 LAB — CBC
HCT: 36.9 % — ABNORMAL LOW (ref 39.0–52.0)
HEMOGLOBIN: 12.7 g/dL — AB (ref 13.0–17.0)
MCH: 30.8 pg (ref 26.0–34.0)
MCHC: 34.4 g/dL (ref 30.0–36.0)
MCV: 89.3 fL (ref 78.0–100.0)
Platelets: 215 10*3/uL (ref 150–400)
RBC: 4.13 MIL/uL — AB (ref 4.22–5.81)
RDW: 14.8 % (ref 11.5–15.5)
WBC: 7.2 10*3/uL (ref 4.0–10.5)

## 2013-07-31 LAB — HEPARIN LEVEL (UNFRACTIONATED)
HEPARIN UNFRACTIONATED: 0.22 [IU]/mL — AB (ref 0.30–0.70)
Heparin Unfractionated: 0.36 IU/mL (ref 0.30–0.70)

## 2013-07-31 LAB — HEMOGLOBIN A1C
Hgb A1c MFr Bld: 13.1 % — ABNORMAL HIGH (ref <5.7)
MEAN PLASMA GLUCOSE: 329 mg/dL — AB (ref <117)

## 2013-07-31 LAB — PROTIME-INR
INR: 0.91 (ref 0.00–1.49)
PROTHROMBIN TIME: 12.1 s (ref 11.6–15.2)

## 2013-07-31 LAB — MAGNESIUM: Magnesium: 1.6 mg/dL (ref 1.5–2.5)

## 2013-07-31 LAB — MRSA PCR SCREENING: MRSA BY PCR: NEGATIVE

## 2013-07-31 MED ORDER — HEPARIN BOLUS VIA INFUSION
1700.0000 [IU] | Freq: Once | INTRAVENOUS | Status: AC
  Administered 2013-07-31: 1700 [IU] via INTRAVENOUS
  Filled 2013-07-31: qty 1700

## 2013-07-31 MED ORDER — CARVEDILOL 6.25 MG PO TABS
6.2500 mg | ORAL_TABLET | Freq: Two times a day (BID) | ORAL | Status: DC
  Administered 2013-07-31 – 2013-08-01 (×2): 6.25 mg via ORAL
  Filled 2013-07-31 (×4): qty 1

## 2013-07-31 MED ORDER — ISOSORBIDE MONONITRATE ER 60 MG PO TB24
60.0000 mg | ORAL_TABLET | Freq: Every day | ORAL | Status: DC
  Administered 2013-07-31 – 2013-08-01 (×2): 60 mg via ORAL
  Filled 2013-07-31 (×2): qty 1

## 2013-07-31 MED ORDER — INSULIN ASPART 100 UNIT/ML ~~LOC~~ SOLN
0.0000 [IU] | Freq: Three times a day (TID) | SUBCUTANEOUS | Status: DC
  Administered 2013-07-31: 10 [IU] via SUBCUTANEOUS
  Administered 2013-07-31: 8 [IU] via SUBCUTANEOUS
  Administered 2013-07-31: 10 [IU] via SUBCUTANEOUS

## 2013-07-31 MED ORDER — HEPARIN BOLUS VIA INFUSION
3000.0000 [IU] | Freq: Once | INTRAVENOUS | Status: AC
  Administered 2013-07-31: 3000 [IU] via INTRAVENOUS
  Filled 2013-07-31: qty 3000

## 2013-07-31 NOTE — Progress Notes (Signed)
ANTICOAGULATION CONSULT NOTE - Follow Up Consult  Pharmacy Consult for Heparin Indication: chest pain/ACS  Allergies  Allergen Reactions  . Baycol [Cerivastatin]   . Stadol [Butorphanol Tartrate] Other (See Comments)    "makes him crazy"    Patient Measurements: Height: 6\' 3"  (190.5 cm) Weight: 265 lb 14 oz (120.6 kg) IBW/kg (Calculated) : 84.5 Heparin Dosing Weight: 110 kg  Vital Signs: Temp: 98.1 F (36.7 C) (04/25 2000) Temp src: Oral (04/25 2000) BP: 77/47 mmHg (04/25 1954) Pulse Rate: 109 (04/25 1954)  Labs:  Recent Labs  07/30/13 1510 07/30/13 1523 07/30/13 1804 07/31/13 0430 07/31/13 0756 07/31/13 1005 07/31/13 1244 07/31/13 2005  HGB 13.6  --   --  12.7*  --   --   --   --   HCT 38.6*  --   --  36.9*  --   --   --   --   PLT 215  --   --  215  --   --   --   --   APTT  --  28  --   --   --   --   --   --   LABPROT  --  12.6  --  12.1  --   --   --   --   INR  --  0.96  --  0.91  --   --   --   --   HEPARINUNFRC  --   --   --  <0.10*  --   --  0.22* 0.36  CREATININE 1.29  --   --  1.11  --   --   --   --   TROPONINI <0.30  --  0.33*  --  <0.30 <0.30  --   --     Estimated Creatinine Clearance: 94 ml/min (by C-G formula based on Cr of 1.11).   Assessment: 63 y.o. M who continues on heparin for ACS sx with a therapeutic heparin level this evening (HL 0.36 << 0.22, goal of 0.3-0.7). No bleeding noted. MD plans to d/c heparin drip on 4/26 a.m.  Goal of Therapy:  Heparin level 0.3-0.7 units/ml Monitor platelets by anticoagulation protocol: Yes   Plan:  1. Continue heparin at 1800 units/hr (18 ml/hr) 2. Will continue to monitor for any signs/symptoms of bleeding and will follow up with heparin level in the a.m to confirm therapeutic  Georgina PillionElizabeth Christalynn Boise, PharmD, BCPS Clinical Pharmacist Pager: [phone] 07/31/2013 9:48 PM

## 2013-07-31 NOTE — Progress Notes (Signed)
ANTICOAGULATION CONSULT NOTE - Follow Up Consult  Pharmacy Consult for heparin Indication: CP/USAP  Labs:  Recent Labs  07/30/13 0120 07/30/13 1510 07/30/13 1523 07/30/13 1804 07/31/13 0430  HGB  --  13.6  --   --  12.7*  HCT  --  38.6*  --   --  36.9*  PLT  --  215  --   --  215  APTT  --   --  28  --   --   LABPROT  --   --  12.6  --  12.1  INR  --   --  0.96  --  0.91  HEPARINUNFRC  --   --   --   --  <0.10*  CREATININE  --  1.29  --   --  1.11  TROPONINI <0.30 <0.30  --  0.33*  --     Assessment: 62yo male undetectable on heparin with initial dosing for CP.  Goal of Therapy:  Heparin level 0.3-0.7 units/ml   Plan:  Will rebolus with heparin 3000 units and increase gtt by 4 units/kg/hr to 1600 units/hr and check level in 6hr.  Timothy Trujillo, PharmD, BCPS  07/31/2013,7:01 AM

## 2013-07-31 NOTE — Progress Notes (Signed)
UR Completed.  Chamberlain Steinborn Jane Calieb Lichtman [phone] 07/31/2013  

## 2013-07-31 NOTE — Progress Notes (Signed)
Primary Cardiologist: Dr. Wyline MoodBranch  Subjective:  Currently improving. Chronic shortness of breath. Reviewed complex medical history. DO NOT RESUSCITATE. Aggressive medical management, noninvasive strategy requested.  Sitting with his pastor. No further chest pain after nitroglycerin.  Objective:  Vital Signs in the last 24 hours: Temp:  [97.1 F (36.2 C)-98.5 F (36.9 C)] 98.5 F (36.9 C) (04/25 0746) Pulse Rate:  [104-120] 108 (04/25 0746) Resp:  [9-24] 14 (04/25 0746) BP: (112-167)/(63-105) 143/63 mmHg (04/25 0746) SpO2:  [88 %-99 %] 96 % (04/25 0746) Weight:  [260 lb (117.935 kg)-265 lb 14 oz (120.6 kg)] 265 lb 14 oz (120.6 kg) (04/25 0419)  Intake/Output from previous day: 04/24 0701 - 04/25 0700 In: 163.1 [I.V.:163.1] Out: 1300 [Urine:1300]   Physical Exam: General: Well developed, well nourished, in no acute distress. Head:  Normocephalic and atraumatic. Unable to assess JVD due to thick neck Lungs: Distant breath sounds, normal-appearing respiratory effort at rest, mild crackles Nasal cannula oxygen Heart: Normal S1 and S2.  No murmur, rubs or gallops.  Abdomen: soft, non-tender, positive bowel sounds. Obese Extremities: No clubbing or cyanosis. Chronic 1+ lower extremity edema. Neurologic: Alert and oriented x 3.    Lab Results:  Recent Labs  07/30/13 1510 07/31/13 0430  WBC 7.7 7.2  HGB 13.6 12.7*  PLT 215 215    Recent Labs  07/30/13 1510 07/31/13 0430  NA 134* 137  K 4.3 4.2  CL 91* 97  CO2 29 24  GLUCOSE 383* 324*  BUN 20 17  CREATININE 1.29 1.11    Recent Labs  07/30/13 1804 07/31/13 0756  TROPONINI 0.33* <0.30   Imaging: Dg Chest Portable 1 View  07/30/2013   CLINICAL DATA:  Chest pain  EXAM: PORTABLE CHEST - 1 VIEW  COMPARISON:  04/19/2013  FINDINGS: Cardiac shadow is stable. Diffuse interstitial changes are noted bilaterally stable from the prior exam. These are likely chronic in etiology. Postsurgical changes are again noted. No  other focal abnormality is seen.  IMPRESSION: Chronic changes bilaterally.   Electronically Signed   By: Alcide CleverMark  Lukens M.D.   On: 07/30/2013 15:14   severe chronic lung disease Personally viewed.   Telemetry: Sinus tachycardia, no adverse arrhythmias Personally viewed.   EKG: 07/30/13-sinus tachycardia, old inferior infarct, old anterior infarct pattern. No change from prior  Cardiac Studies:   Echocardiogram 01/11/13- - Study data: Technically difficult study. - Left ventricle: The cavity size was mildly dilated. Wall thickness was normal. The estimated ejection fraction was in the range of 15% to 20%. This is significantly worst compared to prior study May 16,2012. The study is not technically sufficient to allow evaluation of LV diastolic function. Internal dimension: 63mm (ED, PLAX). - Aortic valve: Mildly calcified annulus. Mildly thickened leaflets. Uncertain number of leaflets. Valve area: 2.87cm^2(VTI). Valve area: 3.1cm^2 (Vmax). - Left atrium: The atrium was severely dilated. - Right ventricle: The cavity size was mildly to moderately dilated. Systolic function was moderately reduced. RV TAPSE is 1.2 cm. - Right atrium: The atrium was moderately dilated. - Pulmonary arteries: Systolic pressure was moderately increased. PA peak pressure: 60mm Hg (S).  [name]. aspirin  324 mg Oral NOW   Or  . aspirin  300 mg Rectal NOW  . aspirin EC  81 mg Oral Daily  . atorvastatin  40 mg Oral QPM  . carvedilol  3.125 mg Oral BID WC  . clopidogrel  75 mg Oral Q breakfast  . digoxin  0.25 mg Oral Daily  . furosemide  80  mg Oral BID  . insulin aspart  0-24 Units Subcutaneous TID WC  . insulin NPH Human  110 Units Subcutaneous BID AC & HS  . lisinopril  2.5 mg Oral Daily  . potassium chloride SA  20 mEq Oral Daily  . sodium chloride  3 mL Intravenous Q12H  . sulfamethoxazole-trimethoprim  1 tablet Oral Q12H  . vitamin C  1,000 mg Oral QHS    Assessment/Plan:  Active Problems:   Diabetes  mellitus   Silicosis   Ischemic cardiomyopathy   COPD, severe  O2 dependent   Chronic systolic dysfunction of left ventricle   Unstable angina   Secondary pulmonary hypertension   Aortic atherosclerosis  63 year old male with severe chronic lung disease secondary to silicosis on dependent oxygen at home with severe coronary artery disease, diabetes, secondary pulmonary hypertension here with unstable angina.  1. Unstable angina-one isolated mildly elevated troponin of 0.33 with repeat less than 0.30 or normal. After review of history and physical and prior notes, it appears that his desires are for continued aggressive medical management, noninvasive strategy. -I will place on isosorbide/Imdur 60 mg. -Discontinue IV nitroglycerin -Increase coreg to 6.25 BID -Consider Ranexa -We will discontinue IV heparin tomorrow morning. -After lengthy discussion with he and his pastor, he does not wish for any further invasive strategies, this was confirmed. DO NOT RESUSCITATE was confirmed. Since his diagnosis of silicosis 3 years ago and his severe cardiomyopathy, his quality of life is very poor.  2. Chronic systolic heart failure -Try to up titrate carvedilol -Low-dose lisinopril -Lasix 80 mg by mouth twice a day -Digoxin, continue  3. coronary artery disease -Previously described left main disease -Plavix, aspirin -Last catheterization recommendations were continued medical management. -Noninvasive strategy.  4. Diabetes -Continue with current management. Medications reviewed. -ISS  5. Silicosis/severe lung disease -Oxygen -This was a result of removal of tile at his house, dust he states 3 years ago.  6. Morbid obesity -Encourage weight loss  Possible discharge tomorrow if stable.  Donato SchultzMark Skains 07/31/2013, 10:44 AM

## 2013-07-31 NOTE — Progress Notes (Signed)
ANTICOAGULATION CONSULT NOTE - Follow Up Consult  Pharmacy Consult for Heparin Indication: chest pain/ACS  Allergies  Allergen Reactions  . Baycol [Cerivastatin]   . Stadol [Butorphanol Tartrate] Other (See Comments)    "makes him crazy"    Patient Measurements: Height: 6\' 3"  (190.5 cm) Weight: 265 lb 14 oz (120.6 kg) IBW/kg (Calculated) : 84.5 Heparin Dosing Weight: 110 Kg  Vital Signs: Temp: 97.6 F (36.4 C) (04/25 1138) Temp src: Oral (04/25 1138) BP: 79/52 mmHg (04/25 1138) Pulse Rate: 104 (04/25 1138)  Labs:  Recent Labs  07/30/13 1510 07/30/13 1523 07/30/13 1804 07/31/13 0430 07/31/13 0756 07/31/13 1005 07/31/13 1244  HGB 13.6  --   --  12.7*  --   --   --   HCT 38.6*  --   --  36.9*  --   --   --   PLT 215  --   --  215  --   --   --   APTT  --  28  --   --   --   --   --   LABPROT  --  12.6  --  12.1  --   --   --   INR  --  0.96  --  0.91  --   --   --   HEPARINUNFRC  --   --   --  <0.10*  --   --  0.22*  CREATININE 1.29  --   --  1.11  --   --   --   TROPONINI <0.30  --  0.33*  --  <0.30 <0.30  --     Estimated Creatinine Clearance: 94 ml/min (by C-G formula based on Cr of 1.11).   Medical History: Past Medical History  Diagnosis Date  . Uncontrolled diabetes mellitus     a. A1C 12.8 in 01/2013.  [name]. Chronic systolic heart failure   . Obesity   . Hyperlipidemia   . Peripheral vascular disease   . Hypertension   . Asthma   . CAD (coronary artery disease)     a. Prior CABG 20 yrs ago (?~1994). b. Hx of multiple stents, prev [place] in [place]. c. [place] 01[place]: cath moderate disease in LAD, distal LCx, prox RCA occluded, 2 SVGs occluded, Tiandra [place] to be stable from prior cath -> for medical therapy.  . Ischemic cardiomyopathy     a. EF 2012: 55-60. b. EF 12/2011: 25-30%. c. Echo 01/2013: EF 15-20%.  . Stroke     15 years ago  . Arthritis   . Anxiety   . Depression   . 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 Chronic respiratory failure     a. Due to COPD.  . Osteomyelitis 2013    was planned for chronic suppressive antibiotics but cannot afford this any longer  . DNR no code (do not resuscitate)     Medications:  Prescriptions prior to admission  Medication Sig Dispense Refill  . aspirin 325 MG tablet Take 325 mg by mouth daily. *takes one tablet daily for heart health. Also takes additional one to two tablets as needed for arthritis pain associated with shoulder pain*      . Aspirin-Salicylamide-Caffeine (BC FAST PAIN RELIEF ARTHRITIS) [id number] MG PACK Take 1 packet by mouth 3 (three) times daily as needed (for pain).      [name]. atorvastatin (LIPITOR) 40 MG tablet Take 40 mg by mouth every  evening.      . carvedilol (COREG) 3.125 MG tablet Take 1 tablet (3.125 mg total) by mouth 2 (two) times daily with a meal.  60 tablet  12  . clopidogrel (PLAVIX) 75 MG tablet Take 75 mg by mouth daily.      . cyclobenzaprine (FLEXERIL) 5 MG tablet Take 5 mg by mouth 3 (three) times daily as needed for muscle spasms.      . digoxin (LANOXIN) 0.25 MG tablet Take 1 tablet (0.25 mg total) by mouth daily.  30 tablet  12  . furosemide (LASIX) 80 MG tablet Take 80 mg by mouth 2 (two) times daily.      [name]. HYDROcodone-acetaminophen (NORCO) 7.5-325 MG per tablet Take 1 tablet by mouth every 6 (six) hours as needed for moderate pain (pain in leg and shoulder).      . insulin NPH (HUMULIN N,NOVOLIN N) 100 UNIT/ML injection Inject 110 Units into the skin 2 (two) times daily.      . isosorbide dinitrate (ISORDIL) 20 MG tablet Take 1 tablet (20 mg total) by mouth 3 (three) times daily.  90 tablet  12  . KLOR-CON M20 20 MEQ tablet Take 20 mEq by mouth daily.       [name]. lisinopril (PRINIVIL,ZESTRIL) 2.5 MG tablet Take 1 tablet (2.5 mg total) by mouth daily.  30 tablet  6  . Magnesium 250 MG TABS Take 250 tablets by mouth at bedtime.       . Methylcobalamin (B-12) 5000 MCG TBDP Take 1 tablet by mouth at bedtime.      .  nitroGLYCERIN (NITROSTAT) 0.4 MG SL tablet Place 0.4 mg under the tongue every 5 (five) minutes as needed for chest pain.       [name]. oxyCODONE-acetaminophen (PERCOCET/ROXICET) 5-325 MG per tablet Take 1 tablet by mouth 4 (four) times daily as needed for moderate pain or severe pain (for knee and shoulder).       [name]. sulfamethoxazole-trimethoprim (BACTRIM DS,SEPTRA DS) 800-160 MG per tablet Take 1 tablet by mouth 2 (two) times daily.  60 tablet  11  . vitamin C (ASCORBIC ACID) 500 MG tablet Take 1,000 mg by mouth at bedtime.      . vitamin E 400 UNIT capsule Take 400 Units by mouth at bedtime.       [name]. Nystatin Domestic POWD Apply powder to rash twice daily  1 each  11    Assessment: 63yo male who presented to ED c/o chest pain.  Pharmacy has been consulted to manage anticoagulation with IV heparin.  Patient's heparin level this afternoon remains subtherapeutic at 0.22, though is trending closer towards goal range.   Hgb 12.7. Platelet count wnl. No bleeding issues noted.   Goal of Therapy:  Heparin level 0.3-0.7 units/ml Monitor platelets by anticoagulation protocol: Yes   Plan:  Give heparin 1700 unit bolus now x 1 Increase heparin infusion to 1800 units/hr Heparin level in 6 hours (2000) Heparin level and CBC daily   Zissel Biederman C. Viveka Wilmeth, PharmD Clinical Pharmacist-Resident Pager: [phone] Pharmacy: [phone] 07/31/2013 2:05 PM

## 2013-08-01 ENCOUNTER — Encounter (HOSPITAL_COMMUNITY): Payer: Self-pay | Admitting: Adult Health

## 2013-08-01 DIAGNOSIS — I10 Essential (primary) hypertension: Secondary | ICD-10-CM

## 2013-08-01 DIAGNOSIS — I739 Peripheral vascular disease, unspecified: Secondary | ICD-10-CM

## 2013-08-01 DIAGNOSIS — I5023 Acute on chronic systolic (congestive) heart failure: Secondary | ICD-10-CM

## 2013-08-01 DIAGNOSIS — I498 Other specified cardiac arrhythmias: Secondary | ICD-10-CM

## 2013-08-01 DIAGNOSIS — E785 Hyperlipidemia, unspecified: Secondary | ICD-10-CM

## 2013-08-01 LAB — CBC
HCT: 36.2 % — ABNORMAL LOW (ref 39.0–52.0)
Hemoglobin: 12.6 g/dL — ABNORMAL LOW (ref 13.0–17.0)
MCH: 31 pg (ref 26.0–34.0)
MCHC: 34.8 g/dL (ref 30.0–36.0)
MCV: 88.9 fL (ref 78.0–100.0)
PLATELETS: 203 10*3/uL (ref 150–400)
RBC: 4.07 MIL/uL — ABNORMAL LOW (ref 4.22–5.81)
RDW: 14.8 % (ref 11.5–15.5)
WBC: 7.5 10*3/uL (ref 4.0–10.5)

## 2013-08-01 LAB — HEPARIN LEVEL (UNFRACTIONATED): Heparin Unfractionated: 0.39 IU/mL (ref 0.30–0.70)

## 2013-08-01 LAB — GLUCOSE, CAPILLARY: Glucose-Capillary: 92 mg/dL (ref 70–99)

## 2013-08-01 MED ORDER — ISOSORBIDE MONONITRATE ER 60 MG PO TB24: 60.0000 mg | ORAL_TABLET | Freq: Every day | ORAL | Status: DC

## 2013-08-01 MED ORDER — CARVEDILOL 3.125 MG PO TABS: 6.2500 mg | ORAL_TABLET | Freq: Two times a day (BID) | ORAL | Status: DC

## 2013-08-01 NOTE — Discharge Summary (Signed)
Personally seen and examined, agree with above. Close followup and further titration of medication if necessary important. Repeat troponins have all been normal. Reassuring. Continue with aggressive medical management, noninvasive approach. DO NOT RESUSCITATE We discussed goals of care during this hospitalization and he relays to me that quality of life is the most important. He has struggled over the last 3 years with his silicosis as well as his severe heart failure. If his condition continues to worsen, palliative care would be helpful.  Donato SchultzMark Skains, MD

## 2013-08-01 NOTE — Progress Notes (Signed)
Pt c/o heartburn radiating up into chest. Pt requested milk because he has successfully used it before for his heartburn. RN reassessed and pt felt no relief. BP assessed 105/66 (72).  One SL nitro given. Pt stated his heartburn was relieved, but is still not feeling well. BP after nitro was 89/33 (53). Subsequent nitro held.RN will continue to assess and monitor patient.

## 2013-08-01 NOTE — Progress Notes (Signed)
Feels better this morning. Left shoulder musculoskeletal pain with heating pack currently in place.  Repeat troponins have all been normal.  Tachycardia mild and persistent likely in response to hypoxia/underlying lung disease as well as EF of 15-20%.  During his hospitalization, his carvedilol was increased to 6.25 twice a day. No further increased because of relative hypotension.  If anginal symptoms persist, one could consider Ranexa.  Once again, aggressive medical management, noninvasive approach. DO NOT RESUSCITATE. Goals of care or for quality of life he states.  Followup with Dr. Wyline MoodBranch in 7 days. Encouraged followup with primary care in 7-10 days. Diabetes control.  Donato SchultzMark Skains, MD

## 2013-08-01 NOTE — Discharge Summary (Signed)
Physician Discharge Summary  Patient ID: Timothy Trujillo MRN: [id number] DOB/AGE: 10/25/1950 63 y.o.  Admit date: 07/30/2013 Discharge date: 08/01/2013  Primary Discharge Diagnosis: 1. Unstable Angina-NSTEMI 2.Chronic Systolic CHF   Secondary Discharge Diagnosis: 1.CAD-LM disease-CABG 2. Diabetes 3. Silicosis   Primary Cardiologist: Dina RichBranch, Jonathan MD  Significant Diagnostic Studies: None   Hospital Course:     Timothy Trujillo is a 63 year old patient of Dr. 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 One episode awoke him from sleeping. He had been taking more nitroglycerin than usual for relief. He was transferred to [facility] on 07/30/2013.     He was found to have a non-ST elevated MI with mild increase in cardiac enzymes 0.22, 0.36, and 0.39 respectively. The patient has agreed to be DO NOT RESUSCITATE, and therefore no aggressive invasive testing was completed. He was placed on IV nitroglycerin which was discontinued after 24 hours, carvedilol was increased to 6.25 mg twice a day, and isosorbide was increased to 60 mg daily. He was continued on by mouth Lasix 80 mg twice a day and Lantus fosinopril with digoxin. If he continues to have recurrent discomfort Ranexa which we can considered.     On day of discharge she was seen and examined by Dr. Chales Abrahamsskeins and found to be stable for discharge from a cardiac standpoint. The patient had a left shoulder musculoskeletal pain and had a heating pack placed for comfort. Repeat troponins have all been normal. He remains slightly tachycardic which was likely responsible hypoxia Lung disease along with EF of 15-20%. It was not recommended that with his carvedilol be increased any higher than 6.25 mg twice a day due to relative  hypotension. He is to followup in the regional office in 7-10 days.  Discharge Exam: Blood pressure 122/68, pulse 97, temperature 97.3 F (36.3 C), temperature source Oral, resp. rate 14, height 6\' 3"  (1.905 m), weight 265 lb 10.5 oz (120.5 kg), SpO2 96.00%.  Labs:   Lab Results  Component Value Date   WBC 7.5 08/01/2013   HGB 12.6* 08/01/2013   HCT 36.2* 08/01/2013   MCV 88.9 08/01/2013   PLT 203 08/01/2013     Recent Labs Lab 07/31/13 0430  NA 137  K 4.2  CL 97  CO2 24  BUN 17  CREATININE 1.11  CALCIUM 9.9  GLUCOSE 324*   Lab Results  Component Value Date   CKTOTAL 29 09/03/2010   CKMB 2.0 09/03/2010   TROPONINI <0.30 07/31/2013    Lab Results  Component Value Date   CHOL 245* 01/15/2013   Lab Results  Component Value Date   HDL 22* 01/15/2013   Lab Results  Component Value Date   LDLCALC 155* 01/15/2013   Lab Results  Component Value Date   TRIG 340* 01/15/2013   Lab Results  Component Value Date   CHOLHDL 11.1 01/15/2013   No results found for this basename: LDLDIRECT      Radiology: Dg Chest Portable 1 View  07/30/2013   CLINICAL DATA:  Chest pain  EXAM: PORTABLE CHEST - 1 VIEW  COMPARISON:  04/19/2013  FINDINGS: Cardiac shadow is stable. Diffuse interstitial changes are noted bilaterally stable from the prior exam. These are likely chronic in etiology. Postsurgical changes are again noted. No other focal abnormality is seen.  IMPRESSION: Chronic  changes bilaterally.   Electronically Signed   By: Alcide Clever M.D.   On: 07/30/2013 15:14    ZOX:WRUEA tachycardia Possible Left atrial enlargement Inferior infarct Anterolateral infarct  FOLLOW UP PLANS AND APPOINTMENTS     Discharge Orders   Future Appointments Provider Department Dept Phone   08/17/2013 11:00 AM Cliffton Asters, MD [facility] [phone]   Future Orders Complete By Expires   Diet - low sodium heart healthy  As directed    Increase activity  slowly  As directed        Medication List    STOP taking these medications       isosorbide dinitrate 20 MG tablet  Commonly known as:  ISORDIL      TAKE these medications       aspirin 325 MG tablet  Take 325 mg by mouth daily. *takes one tablet daily for heart health. Also takes additional one to two tablets as needed for arthritis pain associated with shoulder pain*     atorvastatin 40 MG tablet  Commonly known as:  LIPITOR  Take 40 mg by mouth every evening.     B-12 5000 MCG Tbdp  Take 1 tablet by mouth at bedtime.     BC FAST PAIN RELIEF ARTHRITIS [id number] MG Pack  Generic drug:  Aspirin-Salicylamide-Caffeine  Take 1 packet by mouth 3 (three) times daily as needed (for pain).     carvedilol 3.125 MG tablet  Commonly known as:  COREG  Take 2 tablets (6.25 mg total) by mouth 2 (two) times daily with a meal.     clopidogrel 75 MG tablet  Commonly known as:  PLAVIX  Take 75 mg by mouth daily.     cyclobenzaprine 5 MG tablet  Commonly known as:  FLEXERIL  Take 5 mg by mouth 3 (three) times daily as needed for muscle spasms.     digoxin 0.25 MG tablet  Commonly known as:  LANOXIN  Take 1 tablet (0.25 mg total) by mouth daily.     furosemide 80 MG tablet  Commonly known as:  LASIX  Take 80 mg by mouth 2 (two) times daily.     HYDROcodone-acetaminophen 7.5-325 MG per tablet  Commonly known as:  NORCO  Take 1 tablet by mouth every 6 (six) hours as needed for moderate pain (pain in leg and shoulder).     insulin NPH Human 100 UNIT/ML injection  Commonly known as:  HUMULIN N,NOVOLIN N  Inject 110 Units into the skin 2 (two) times daily.     isosorbide mononitrate 60 MG 24 hr tablet  Commonly known as:  IMDUR  Take 1 tablet (60 mg total) by mouth daily.     KLOR-CON M20 20 MEQ tablet  Generic drug:  potassium chloride SA  Take 20 mEq by mouth daily.     lisinopril 2.5 MG tablet  Commonly known as:  PRINIVIL,ZESTRIL  Take 1 tablet (2.5 mg total) by mouth  daily.     Magnesium 250 MG Tabs  Take 250 tablets by mouth at bedtime.     nitroGLYCERIN 0.4 MG SL tablet  Commonly known as:  NITROSTAT  Place 0.4 mg under the tongue every 5 (five) minutes as needed for chest pain.     Nystatin Domestic Powd  Apply powder to rash twice daily     oxyCODONE-acetaminophen 5-325 MG per tablet  Commonly known as:  PERCOCET/ROXICET  Take 1 tablet by mouth 4 (four) times daily as needed  for moderate pain or severe pain (for knee and shoulder).     sulfamethoxazole-trimethoprim 800-160 MG per tablet  Commonly known as:  BACTRIM DS,SEPTRA DS  Take 1 tablet by mouth 2 (two) times daily.     vitamin C 500 MG tablet  Commonly known as:  ASCORBIC ACID  Take 1,000 mg by mouth at bedtime.     vitamin E 400 UNIT capsule  Take 400 Units by mouth at bedtime.       Follow-up Information   Follow up with Antoine PocheBRANCH, JONATHAN, F, MD.   Specialty:  Cardiology   Contact information:   [address] [place] [id number] [phone]       Please follow up. (Our office will call you for appt time and date.)         Time spent with patient to include physician time: 30 minutes. Signed: Jodelle GrossKathryn M Daphene Chisholm 08/01/2013, 8:12 AM Co-Sign MD

## 2013-08-02 MED FILL — Nitroglycerin IV Soln 200 MCG/ML in D5W: INTRAVENOUS | Qty: 250 | Status: AC

## 2013-08-02 MED FILL — Heparin Sodium (Porcine) 100 Unt/ML in Sodium Chloride 0.45%: INTRAMUSCULAR | Qty: 250 | Status: AC

## 2013-08-09 NOTE — Progress Notes (Signed)
HPI: [name] is a 63 year old patient of Dr. Velta Trujillo following posthospitalization where he was admitted with non-ST elevation MI and chronic systolic CHF. The patient has a known history of CAD with left main disease status post CABG, diabetes, and silicosis.   During hospitalization the patient was made a DO NOT RESUSCITATE and therefore no aggressive invasive testing was completed. His carvedilol was increased to 6.25 mg twice a day, isosorbide was increased to 60 mg daily, and he was continued on Lasix 80 mg twice a day, fosinopril with digoxin. Discussion for an additional medications for angina with use of Ranexa was considered but not started during hospitalization. Repeated troponins during hospitalization were negative. The initial elevation of troponin was likely related to hypoxia, lung disease, and systolic dysfunction. He was not recommended for him to increase his carvedilol higher than 6.25 mg twice a day due to relative hypotension found during hospitalization.    Today accompanied by his 6 year old grandson who is his main caretaker. His name is Timothy Trujillo. The patient is completely confused about his medication regimen. His grandson also has no idea what he needs to be on. He has brought with him his list of discharge medications but he states he is taking whatever is on the bottle. He did not bring his medications with him. He states he continues to have GERD symptoms when lying down flat and chronic pain in his left shoulder.   Allergies  Allergen Reactions  . Baycol [Cerivastatin]   . Stadol [Butorphanol Tartrate] Other (See Comments)    "makes him crazy"    Current Outpatient Prescriptions  Medication Sig Dispense Refill  . aspirin 325 MG tablet Take 325 mg by mouth daily. *takes one tablet daily for heart health. Also takes additional one to two tablets as needed for arthritis pain associated with shoulder pain*      . Aspirin-Salicylamide-Caffeine (BC FAST PAIN RELIEF  ARTHRITIS) [id number] MG PACK Take 1 packet by mouth 3 (three) times daily as needed (for pain).      [name] atorvastatin (LIPITOR) 40 MG tablet Take 40 mg by mouth every evening.      . carvedilol (COREG) 3.125 MG tablet Take 2 tablets (6.25 mg total) by mouth 2 (two) times daily with a meal.  60 tablet  12  . clopidogrel (PLAVIX) 75 MG tablet Take 75 mg by mouth daily.      . cyclobenzaprine (FLEXERIL) 5 MG tablet Take 5 mg by mouth 3 (three) times daily as needed for muscle spasms.      . digoxin (LANOXIN) 0.25 MG tablet Take 1 tablet (0.25 mg total) by mouth daily.  30 tablet  12  . furosemide (LASIX) 80 MG tablet Take 80 mg by mouth 2 (two) times daily.      [name] HYDROcodone-acetaminophen (NORCO) 7.5-325 MG per tablet Take 1 tablet by mouth every 6 (six) hours as needed for moderate pain (pain in leg and shoulder).      . insulin NPH (HUMULIN N,NOVOLIN N) 100 UNIT/ML injection Inject 110 Units into the skin 2 (two) times daily.      . isosorbide mononitrate (IMDUR) 60 MG 24 hr tablet Take 1 tablet (60 mg total) by mouth daily.  30 tablet  6  . KLOR-CON M20 20 MEQ tablet Take 20 mEq by mouth daily.       [name] lisinopril (PRINIVIL,ZESTRIL) 2.5 MG tablet Take 1 tablet (2.5 mg total) by mouth daily.  30 tablet  6  . Magnesium 250 MG  TABS Take 250 tablets by mouth at bedtime.       . Methylcobalamin (B-12) 5000 MCG TBDP Take 1 tablet by mouth at bedtime.      . nitroGLYCERIN (NITROSTAT) 0.4 MG SL tablet Place 0.4 mg under the tongue every 5 (five) minutes as needed for chest pain.       [name]. Nystatin Domestic POWD Apply powder to rash twice daily  1 each  11  . oxyCODONE-acetaminophen (PERCOCET/ROXICET) 5-325 MG per tablet Take 1 tablet by mouth 4 (four) times daily as needed for moderate pain or severe pain (for knee and shoulder).       [name]. sulfamethoxazole-trimethoprim (BACTRIM DS,SEPTRA DS) 800-160 MG per tablet Take 1 tablet by mouth 2 (two) times daily.  60 tablet  11  . vitamin C (ASCORBIC ACID) 500 MG  tablet Take 1,000 mg by mouth at bedtime.      . vitamin E 400 UNIT capsule Take 400 Units by mouth at bedtime.        No current facility-administered medications for this visit.    Past Medical History  Diagnosis Date  . Uncontrolled diabetes mellitus     a. A1C 12.8 in 01/2013.  [name]. Chronic systolic heart failure   . Obesity   . Hyperlipidemia   . Peripheral vascular disease   . Hypertension   . Asthma   . CAD (coronary artery disease)     a. Prior CABG 20 yrs ago (?~1994). b. Hx of multiple stents, prev [place]. c. [place] 01[place]: cath moderate disease in LAD, distal LCx, prox RCA occluded, 2 SVGs occluded, felt to be stable from prior cath -> for medical therapy.  . Ischemic cardiomyopathy     a. EF 2012: 55-60. b. EF 12/2011: 25-30%. c. Echo 01/2013: EF 15-20%.  . Stroke     15 years ago  . Arthritis   . Anxiety   . Depression   . 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 Chronic respiratory failure     a. Due to COPD.  . Osteomyelitis 2013    was planned for chronic suppressive antibiotics but cannot afford this any longer  . DNR no code (do not resuscitate) 07/2013    Past Surgical History  Procedure Laterality Date  . Coronary artery bypass graft    . Coronary stent placement  2008    CABG grafts closed  . Joint replacement    . Cholecystectomy    . Eye surgery    . I&d extremity  06/12/2011    Procedure: IRRIGATION AND DEBRIDEMENT EXTREMITY;  Surgeon: Timothy DockerJeffrey C Beane, MD;  Location: [facility] OR;  Service: Orthopedics;  Laterality: Right;  I&D Right [place]    ZOX:WRUEAVROS:Review of systems complete and found to be negative unless listed above  PHYSICAL EXAM There were no vitals taken for this visit. General: Well developed, obese, well nourished, in no acute distress, sitting in a wheelchair. Head: Eyes PERRLA, No xanthomas.   Normal cephalic and atramatic  Lungs: Clear bilaterally to auscultation and percussion. Heart:  HRRR S1 S2, tachycardic, without MRG.  Pulses are 2+ & equal.            No carotid bruit. No JVD.  No abdominal bruits. No femoral bruits. Abdomen: Bowel sounds are positive, abdomen soft and non-tender without masses or                  Hernia's noted.  Msk:  Back normal, does not angulate, uses wheelchair.. Normal strength and tone for age. Extremities: No clubbing, cyanosis, dependent edema.  DP +1 Neuro: Alert and oriented X 3. Psych:  Good affect, responds appropriately     ASSESSMENT AND PLAN

## 2013-08-10 ENCOUNTER — Encounter: Payer: Self-pay | Admitting: Adult Health

## 2013-08-10 ENCOUNTER — Ambulatory Visit (INDEPENDENT_AMBULATORY_CARE_PROVIDER_SITE_OTHER): Payer: Medicare HMO | Admitting: Adult Health

## 2013-08-10 VITALS — BP 120/72 | HR 104 | Ht 75.0 in | Wt 260.0 lb

## 2013-08-10 DIAGNOSIS — I10 Essential (primary) hypertension: Secondary | ICD-10-CM

## 2013-08-10 DIAGNOSIS — I5023 Acute on chronic systolic (congestive) heart failure: Secondary | ICD-10-CM | POA: Diagnosis not present

## 2013-08-10 DIAGNOSIS — E119 Type 2 diabetes mellitus without complications: Secondary | ICD-10-CM

## 2013-08-10 DIAGNOSIS — I519 Heart disease, unspecified: Secondary | ICD-10-CM | POA: Diagnosis not present

## 2013-08-10 DIAGNOSIS — R079 Chest pain, unspecified: Secondary | ICD-10-CM

## 2013-08-10 MED ORDER — CARVEDILOL 6.25 MG PO TABS: 6.2500 mg | ORAL_TABLET | Freq: Two times a day (BID) | ORAL | Status: DC

## 2013-08-10 NOTE — Progress Notes (Deleted)
Name: Timothy Trujillo    DOB: [birth year]  Age: 63 y.o.  MR#: [id number]       PCP:  Fredirick Maudlin, MD      Insurance: Payor: [employer]ICARE / Plan: [employer] MEDICARE HMO / Product Type: *No Product type* /   CC:    Chief Complaint  Patient presents with  . Coronary Artery Disease  . Congestive Heart Failure    VS Filed Vitals:   08/10/13 1349  BP: 120/72  Pulse: 104  Height: 6\' 3"  (1.905 m)  Weight: 260 lb (117.935 kg)    Weights Current Weight  08/10/13 260 lb (117.935 kg)  08/01/13 265 lb 10.5 oz (120.5 kg)  07/05/13 254 lb (115.214 kg)    Blood Pressure  BP Readings from Last 3 Encounters:  08/10/13 120/72  08/01/13 122/68  07/05/13 116/70     Admit date:  (Not on file) Last encounter with RMR:  05/03/2013   Allergy Baycol and Stadol  Current Outpatient Prescriptions  Medication Sig Dispense Refill  . aspirin 325 MG tablet Take 325 mg by mouth daily. *takes one tablet daily for heart health. Also takes additional one to two tablets as needed for arthritis pain associated with shoulder pain*      . Aspirin-Salicylamide-Caffeine (BC FAST PAIN RELIEF ARTHRITIS) 409-811-91 MG PACK Take 1 packet by mouth 3 (three) times daily as needed (for pain).      [name] atorvastatin (LIPITOR) 40 MG tablet Take 40 mg by mouth every evening.      . cyclobenzaprine (FLEXERIL) 5 MG tablet Take 5 mg by mouth 3 (three) times daily as needed for muscle spasms.      . digoxin (LANOXIN) 0.25 MG tablet Take 1 tablet (0.25 mg total) by mouth daily.  30 tablet  12  . furosemide (LASIX) 80 MG tablet Take 80 mg by mouth 2 (two) times daily.      [name] HYDROcodone-acetaminophen (NORCO) 7.5-325 MG per tablet Take 1 tablet by mouth every 6 (six) hours as needed for moderate pain (pain in leg and shoulder).      . insulin NPH (HUMULIN N,NOVOLIN N) 100 UNIT/ML injection Inject 110 Units into the skin 2 (two) times daily.      . isosorbide mononitrate (IMDUR) 60 MG 24 hr tablet Take 1 tablet (60 mg total) by  mouth daily.  30 tablet  6  . KLOR-CON M20 20 MEQ tablet Take 20 mEq by mouth daily.       [name] lisinopril (PRINIVIL,ZESTRIL) 2.5 MG tablet Take 1 tablet (2.5 mg total) by mouth daily.  30 tablet  6  . Magnesium 250 MG TABS Take 250 tablets by mouth at bedtime.       . Methylcobalamin (B-12) 5000 MCG TBDP Take 1 tablet by mouth at bedtime.      . nitroGLYCERIN (NITROSTAT) 0.4 MG SL tablet Place 0.4 mg under the tongue every 5 (five) minutes as needed for chest pain.       [name] Nystatin Domestic POWD Apply powder to rash twice daily  1 each  11  . oxyCODONE-acetaminophen (PERCOCET/ROXICET) 5-325 MG per tablet Take 1 tablet by mouth 4 (four) times daily as needed for moderate pain or severe pain (for knee and shoulder).       [name] sulfamethoxazole-trimethoprim (BACTRIM DS,SEPTRA DS) 800-160 MG per tablet Take 1 tablet by mouth 2 (two) times daily.  60 tablet  11  . vitamin C (ASCORBIC ACID) 500 MG tablet Take 1,000 mg by mouth at bedtime.      [name]  vitamin E 400 UNIT capsule Take 400 Units by mouth at bedtime.       . carvedilol (COREG) 3.125 MG tablet Take 2 tablets (6.25 mg total) by mouth 2 (two) times daily with a meal.  60 tablet  12   No current facility-administered medications for this visit.    Discontinued Meds:    Medications Discontinued During This Encounter  Medication Reason  . clopidogrel (PLAVIX) 75 MG tablet Error    Patient Active Problem List   Diagnosis Date Noted  . Secondary pulmonary hypertension 07/31/2013  . Aortic atherosclerosis 07/31/2013  . Unstable angina 07/30/2013  . Chronic systolic dysfunction of left ventricle 04/29/2013  . DNR no code (do not resuscitate) 04/29/2013  . Chest pain 04/19/2013  . Rotator cuff syndrome of left shoulder 02/24/2013  . Acute on chronic systolic heart failure 01/16/2013  . Ischemic cardiomyopathy 01/16/2013  . Sinus tachycardia 01/16/2013  . COPD, severe  O2 dependent 01/16/2013  . Cerebrovascular disease 01/08/2013  . Diabetes  mellitus 06/12/2011  . Hypertension 06/12/2011  . Hyperlipidemia 06/12/2011  . Silicosis 06/12/2011  . Osteomyelitis of knee region 06/12/2011  . Cerebral artery occlusion   . Obesity   . Peripheral vascular disease     LABS    Component Value Date/Time   NA 137 07/31/2013 0430   NA 134* 07/30/2013 1510   NA 134* 07/05/2013 1625   K 4.2 07/31/2013 0430   K 4.3 07/30/2013 1510   K 5.6* 07/05/2013 1625   CL 97 07/31/2013 0430   CL 91* 07/30/2013 1510   CL 93* 07/05/2013 1625   CO2 24 07/31/2013 0430   CO2 29 07/30/2013 1510   CO2 29 07/05/2013 1625   GLUCOSE 324* 07/31/2013 0430   GLUCOSE 383* 07/30/2013 1510   GLUCOSE 296* 07/05/2013 1625   BUN 17 07/31/2013 0430   BUN 20 07/30/2013 1510   BUN 22 07/05/2013 1625   CREATININE 1.11 07/31/2013 0430   CREATININE 1.29 07/30/2013 1510   CREATININE 1.89* 07/05/2013 1625   CREATININE 1.04 04/20/2013 0711   CREATININE 1.29 07/19/2011 1207   CALCIUM 9.9 07/31/2013 0430   CALCIUM 10.1 07/30/2013 1510   CALCIUM 9.6 07/05/2013 1625   GFRNONAA 68* 07/31/2013 0430   GFRNONAA 57* 07/30/2013 1510   GFRNONAA 74* 04/20/2013 0711   GFRNONAA 59* 07/19/2011 1207   GFRAA 79* 07/31/2013 0430   GFRAA 66* 07/30/2013 1510   GFRAA 86* 04/20/2013 0711   GFRAA 68 07/19/2011 1207   CMP     Component Value Date/Time   NA 137 07/31/2013 0430   K 4.2 07/31/2013 0430   CL 97 07/31/2013 0430   CO2 24 07/31/2013 0430   GLUCOSE 324* 07/31/2013 0430   BUN 17 07/31/2013 0430   CREATININE 1.11 07/31/2013 0430   CREATININE 1.89* 07/05/2013 1625   CALCIUM 9.9 07/31/2013 0430   PROT 6.5 04/20/2013 0711   ALBUMIN 3.2* 04/20/2013 0711   AST 10 04/20/2013 0711   ALT 8 04/20/2013 0711   ALKPHOS 81 04/20/2013 0711   BILITOT 0.5 04/20/2013 0711   GFRNONAA 68* 07/31/2013 0430   GFRNONAA 59* 07/19/2011 1207   GFRAA 79* 07/31/2013 0430   GFRAA 68 07/19/2011 1207       Component Value Date/Time   WBC 7.5 08/01/2013 0310   WBC 7.2 07/31/2013 0430   WBC 7.7 07/30/2013 1510   HGB 12.6* 08/01/2013 0310    HGB 12.7* 07/31/2013 0430   HGB 13.6 07/30/2013 1510   HCT 36.2* 08/01/2013 0310  HCT 36.9* 07/31/2013 0430   HCT 38.6* 07/30/2013 1510   MCV 88.9 08/01/2013 0310   MCV 89.3 07/31/2013 0430   MCV 88.5 07/30/2013 1510    Lipid Panel     Component Value Date/Time   CHOL 245* 01/15/2013 0800   TRIG 340* 01/15/2013 0800   HDL 22* 01/15/2013 0800   CHOLHDL 11.1 01/15/2013 0800   VLDL 68* 01/15/2013 0800   LDLCALC 155* 01/15/2013 0800    ABG    Component Value Date/Time   PHART 7.336* 01/15/2013 1400   PCO2ART 45.6* 01/15/2013 1400   PO2ART 81.0 01/15/2013 1400   HCO3 24.4* 01/15/2013 1400   TCO2 26 01/15/2013 1400   ACIDBASEDEF 2.0 01/15/2013 1400   O2SAT 95.0 01/15/2013 1400     Lab Results  Component Value Date   TSH 1.460 07/30/2013   BNP (last 3 results)  Recent Labs  01/18/13 0500 04/19/13 2033 07/30/13 1523  PROBNP 1270.0* 1316.0* 604.2*   Cardiac Panel (last 3 results) No results found for this basename: CKTOTAL, CKMB, TROPONINI, RELINDX,  in the last 72 hours  Iron/TIBC/Ferritin No results found for this basename: iron, tibc, ferritin     EKG Orders placed during the hospital encounter of 07/30/13  . EKG 12-LEAD  . EKG 12-LEAD  . EKG     Prior Assessment and Plan Problem List as of 08/10/2013     Cardiovascular and Mediastinum   Cerebral artery occlusion   Peripheral vascular disease   Hypertension   Last Assessment & Plan   05/03/2013 Office Visit Written 05/03/2013  3:31 PM by Jodelle Gross, NP     BP is low normal for him. Will continue the lisinopril and carvediolol. BMET will be drawn prior to next visit.    Cerebrovascular disease   Acute on chronic systolic heart failure   Last Assessment & Plan   05/03/2013 Office Visit Written 05/03/2013  3:32 PM by Jodelle Gross, NP     No evidence of fluid overload. He is advised to cut back on Gator-Aid as this does contain salt.     Ischemic cardiomyopathy   Last Assessment & Plan   05/03/2013  Office Visit Edited 05/03/2013  3:32 PM by Jodelle Gross, NP     He is stable. He has refused ICD and wishes to be DNR. I have given him info on obtaining Living Will from internet or from PCP. He will need to have this available should he be readmitted. In the interim, he will continue current medications. I will not go up on carvediolol at this time as he is is feeling lightheaded and is mildly hypotensive.   I have explained that he will need to have a low BP in the setting of EF of 12%, and will need to get used to medications causing him to feel lightheaded. He will need titration up on next visit with carvedilol if he can tolerate it. He is to see Dr. Wyline Mood as he is established with him.He states that he doesn't always take the lasix daily if he is going to be away from home, but will take it when he returns home.     Chronic systolic dysfunction of left ventricle   Unstable angina   Secondary pulmonary hypertension   Aortic atherosclerosis     Respiratory   Silicosis   COPD, severe  O2 dependent     Endocrine   Diabetes mellitus     Musculoskeletal and Integument   Osteomyelitis of knee region  Last Assessment & Plan   09/04/2011 Office Visit Edited 09/04/2011 12:01 PM by Ginnie SmartJeffrey C Hatcher, MD     Somewhat limited with Cx (-). WIll cont his IV therapy as previously written and then transition him to bactrim. He has seen Dr Shelle IronBeane who also [facility]. Will see him back in 6 weeks and consider MRI at that time. He will call if he has worsening in the intervening period.    Rotator cuff syndrome of left shoulder     Other   Obesity   Last Assessment & Plan   01/26/2013 Office Visit Written 01/26/2013  1:38 PM by Jodelle GrossKathryn M Lawrence, NP     Very sedentary uses wheel chair for ambulation,     Hyperlipidemia   Last Assessment & Plan   01/26/2013 Office Visit Written 01/26/2013  1:37 PM by Jodelle GrossKathryn M Lawrence, NP     Continue statin. Follow up labs in 3 months.    Sinus  tachycardia   Chest pain   DNR no code (do not resuscitate)       Imaging: Dg Chest Portable 1 View  07/30/2013   CLINICAL DATA:  Chest pain  EXAM: PORTABLE CHEST - 1 VIEW  COMPARISON:  04/19/2013  FINDINGS: Cardiac shadow is stable. Diffuse interstitial changes are noted bilaterally stable from the prior exam. These are likely chronic in etiology. Postsurgical changes are again noted. No other focal abnormality is seen.  IMPRESSION: Chronic changes bilaterally.   Electronically Signed   By: Alcide CleverMark  Lukens M.D.   On: 07/30/2013 15:14

## 2013-08-10 NOTE — Assessment & Plan Note (Signed)
He does have some lower extremity dependent edema. He is trying to avoid salty foods, with an EF of 50% and difficulty understanding his medication regimen this will be difficult to control. I have advised him and his grandson on a low sodium diet. Overall this gentleman has a poor prognosis based upon his social situation. As [facility]. Skilled nursing facility placement may be an option for him after overall evaluation.

## 2013-08-10 NOTE — Assessment & Plan Note (Signed)
He will be followed by his primary care physician Dr. Juanetta GoslingHawkins. He states it is very difficult to control. He may be having some diabetic gastroparesis with abdominal pain and burping. Referral to GI specialist her PCP is recommended at his discretion

## 2013-08-10 NOTE — Assessment & Plan Note (Signed)
I am uncertain if he is taking 6.25 mg twice a day vs. 3.125 mg twice a day as he has one bottle of medication that he picked up post hospitalization but is only taking one pill twice a day. He does not know if it is higher dose or not. His grandson who is with him has very little understanding concerning his medication regimen. The patient is also confused on his dig ox and medication. He states the medicine that "begins with the D.".  I will have [facility] contact him, to have Ms. Hayden PedroGilboy RN come by to see him go over his medications and make recommendations concerning his needs at home. I am concerned that a 65 year old grandson is his main caretaker, with very little understanding about his health status or medications.  He will return in one month, he will bring his medications with him, and I have reviewed all medications with him prior to him leaving. Ms. Les PouCarlton RN has also highlighted the 2 medications he needs to be taking and their doses as he is bit confused about this.

## 2013-08-10 NOTE — Assessment & Plan Note (Signed)
Chronic for him. He is narcotic dependent.

## 2013-08-10 NOTE — Assessment & Plan Note (Signed)
Blood pressure is not optimal for a patient with an EF of 15%. Not make medication changes at this time in time certainly he is taking and at what doses. He is not active at all, dependent on wheelchair for ambulation. He is advised on a low sodium diet.

## 2013-08-10 NOTE — Patient Instructions (Addendum)
Your physician recommends that you schedule a follow-up appointment in: 1 month   Please take Coreg 6.25 mg twice a day  I put the prescription in the [facility]  pharmacy for you   PLEASE BRING ALL YOUR MEDICATIONS TO NEXT VISIT     Thank you for choosing [facility] !

## 2013-08-10 NOTE — Progress Notes (Signed)
[facility], it is unclear if patient will be accepted as [place].I [facility] to [facility]  Tomorrow after they investigate    C.Les Pouarlton RN

## 2013-08-17 ENCOUNTER — Ambulatory Visit: Payer: Medicare HMO | Admitting: Internal Medicine

## 2013-09-03 NOTE — Progress Notes (Signed)
This encounter was created in error - please disregard.

## 2013-09-09 NOTE — Progress Notes (Signed)
HPI: [name] is a 63 year old patient of Dr. Dina RichJonathan Branch we are following for ongoing assessment and management of chronic systolic dysfunction, recent admission in May for non-ST elevation MI. The patient has a known history of CAD with left main disease post CABG, diabetes, and silicosis. On last office visit in May 2015 he was accompanied by his 65 year old grandson who is his main caretaker. [facility] for home medication management and blood pressure control.  They were a lot of confusion concerning his medications. He was due to be on 6.25 mg of carvedilol twice a day, uncertain if he was a low-sodium diet or taking diuretic as directed. The patient's ejection fraction at was noted at 15%. There was concerns that his 65 year old grandson being his main caretaker was unaware of the gravity of the patient's status and was unable to care for him. Life vest was not placed on the patient as he was adamant about being a DO NOT RESUSCITATE and did not want to prolong his life.  He comes today having not [facility], stating that his cell phone battery was dead and he was not able to receive any phone calls. Apparently they called him several times and left messages. He is taking his medications as directed, continues to take a good bit of pain medication for chronic lower back pain. Remains wheelchair dependent. Can also dependent on his 65 year old grandson for care. Denies palpitations or chest pain. He reiterates the fact that he does now wish any heroic measures concerning his heart, which did be treated with medication only and does not wish any further testing.    Allergies  Allergen Reactions  . Baycol [Cerivastatin]   . Stadol [Butorphanol Tartrate] Other (See Comments)    "makes him crazy"    Current Outpatient Prescriptions  Medication Sig Dispense Refill  . aspirin 325 MG tablet Take 325 mg by mouth daily.  *takes one tablet daily for heart health. Also takes additional one to two tablets as needed for arthritis pain associated with shoulder pain*      . Aspirin-Salicylamide-Caffeine (BC FAST PAIN RELIEF ARTHRITIS) [id number] MG PACK Take 1 packet by mouth 3 (three) times daily as needed (for pain).      [name]. atorvastatin (LIPITOR) 40 MG tablet Take 40 mg by mouth every evening.      . carvedilol (COREG) 6.25 MG tablet Take 1 tablet (6.25 mg total) by mouth 2 (two) times daily.  180 tablet  3  . cyclobenzaprine (FLEXERIL) 5 MG tablet Take 5 mg by mouth 3 (three) times daily as needed for muscle spasms.      . digoxin (LANOXIN) 0.25 MG tablet Take 1 tablet (0.25 mg total) by mouth daily.  30 tablet  12  . furosemide (LASIX) 80 MG tablet Take 80 mg by mouth 2 (two) times daily.      [name]. HYDROcodone-acetaminophen (NORCO) 7.5-325 MG per tablet Take 1 tablet by mouth every 6 (six) hours as needed for moderate pain (pain in leg and shoulder).      . insulin NPH (HUMULIN N,NOVOLIN N) 100 UNIT/ML injection Inject 110 Units into the skin 2 (two) times daily.      . isosorbide mononitrate (IMDUR) 60 MG 24 hr tablet Take 1 tablet (60 mg total) by mouth daily.  30 tablet  6  . KLOR-CON M20 20 MEQ tablet Take 20 mEq by mouth daily.       .[name]  lisinopril (PRINIVIL,ZESTRIL) 2.5 MG tablet Take 1 tablet (2.5 mg total) by mouth daily.  30 tablet  6  . Magnesium 250 MG TABS Take 250 tablets by mouth at bedtime.       . Methylcobalamin (B-12) 5000 MCG TBDP Take 1 tablet by mouth at bedtime.      . nitroGLYCERIN (NITROSTAT) 0.4 MG SL tablet Place 0.4 mg under the tongue every 5 (five) minutes as needed for chest pain.       [name] Nystatin Domestic POWD Apply powder to rash twice daily  1 each  11  . oxyCODONE-acetaminophen (PERCOCET/ROXICET) 5-325 MG per tablet Take 1 tablet by mouth 4 (four) times daily as needed for moderate pain or severe pain (for knee and shoulder).       [name] sulfamethoxazole-trimethoprim (BACTRIM DS,SEPTRA DS)  800-160 MG per tablet Take 1 tablet by mouth 2 (two) times daily.  60 tablet  11  . vitamin C (ASCORBIC ACID) 500 MG tablet Take 1,000 mg by mouth at bedtime.      . vitamin E 400 UNIT capsule Take 400 Units by mouth at bedtime.        No current facility-administered medications for this visit.    Past Medical History  Diagnosis Date  . Uncontrolled diabetes mellitus     a. A1C 12.8 in 01/2013.  [name] Chronic systolic heart failure   . Obesity   . Hyperlipidemia   . Peripheral vascular disease   . Hypertension   . Asthma   . CAD (coronary artery disease)     a. Prior CABG 20 yrs ago (?~1994). b. Hx of multiple stents, prev followed in [place]. c. [place] 01/2013: cath moderate disease in LAD, distal LCx, prox RCA occluded, 2 SVGs occluded, felt to be stable from prior cath -> for medical therapy.  . Ischemic cardiomyopathy     a. EF 2012: 55-60. b. EF 12/2011: 25-30%. c. Echo 01/2013: EF 15-20%.  . Stroke     15 years ago  . Arthritis   . Anxiety   . Depression   . 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 Chronic respiratory failure     a. Due to COPD.  . Osteomyelitis 2013    was planned for chronic suppressive antibiotics but cannot afford this any longer  . DNR no code (do not resuscitate) 07/2013    Past Surgical History  Procedure Laterality Date  . Coronary artery bypass graft    . Coronary stent placement  2008    CABG grafts closed  . Joint replacement    . Cholecystectomy    . Eye surgery    . I&d extremity  06/12/2011    Procedure: IRRIGATION AND DEBRIDEMENT EXTREMITY;  Surgeon: Javier Docker, MD;  Location: [facility] OR;  Service: Orthopedics;  Laterality: Right;  I&D Right [place]    ROS: Review of systems complete and found to be negative unless listed above  PHYSICAL EXAM BP 94/62  Pulse 88  Ht 6\' 3"  (1.905 m)  Wt 252 lb (114.306 kg)  BMI 31.50 kg/m2  SpO2 94% General: Well developed, well nourished, in no acute  distress, sitting in a wheelchair. Wearing oxygen. Head: Eyes PERRLA, No xanthomas.   Normal cephalic and atramatic  Lungs: Clear bilaterally to auscultation and percussion. Heart: HRRR S1 S2, distant, without MRG.  Pulses are 2+ & equal.            No  carotid bruit. No JVD.  No abdominal bruits. No femoral bruits. Abdomen: Bowel sounds are positive, abdomen soft and non-tender without masses or                  Hernia's noted. Msk:  Back normal, will check dependent,. Overall diminished strength and tone for age. Extremities: No clubbing, cyanosis or edema.  DP +1 Neuro: Alert and oriented X 3. Psych:  Good affect, responds appropriately.  ASSESSMENT AND PLAN

## 2013-09-10 ENCOUNTER — Telehealth: Payer: Self-pay

## 2013-09-10 ENCOUNTER — Encounter: Payer: Self-pay | Admitting: Adult Health

## 2013-09-10 ENCOUNTER — Ambulatory Visit (INDEPENDENT_AMBULATORY_CARE_PROVIDER_SITE_OTHER): Payer: Commercial Managed Care - HMO | Admitting: Adult Health

## 2013-09-10 VITALS — BP 94/62 | HR 88 | Ht 75.0 in | Wt 252.0 lb

## 2013-09-10 DIAGNOSIS — I255 Ischemic cardiomyopathy: Secondary | ICD-10-CM

## 2013-09-10 DIAGNOSIS — I10 Essential (primary) hypertension: Secondary | ICD-10-CM

## 2013-09-10 DIAGNOSIS — I2589 Other forms of chronic ischemic heart disease: Secondary | ICD-10-CM

## 2013-09-10 DIAGNOSIS — I200 Unstable angina: Secondary | ICD-10-CM

## 2013-09-10 NOTE — Telephone Encounter (Signed)
LM for Colleen Can RN  at [facility] land line [phone] and cell [phone]  In regards to this patient.He is here in cardiology office now with K.lawrence NP and she would like to discuss patients case.

## 2013-09-10 NOTE — Patient Instructions (Addendum)
  Your physician recommends that you schedule a follow-up appointment in: 3 months with Dr.Branch    Your physician recommends that you continue on your current medications as directed. Please refer to the Current Medication list given to you today.      Please call Colleen Can at [facility] at [phone]  Or cell [phone]     Thank you for choosing [facility] !

## 2013-09-10 NOTE — Assessment & Plan Note (Signed)
Not titrate his medications any further. And continue him on his current medication regimen. He is hypotensive here, and appears euvolemic. He refuses life vest which has been offered to him. We will see him again in 3 months unless he requires sooner evaluation

## 2013-09-10 NOTE — Assessment & Plan Note (Signed)
Low normal for patient with EF of 15%. He will continue current m lisinopril, digoxin, and isosorbide. Along with diuretics.

## 2013-09-10 NOTE — Assessment & Plan Note (Signed)
He states that he has chest discomfort or shortness of breath when he forgets to take his medications. He states that he did forget for couple days, and nitroglycerin was used sublingual. He is being reminded daily by his grandson to take his medications when his grandson comes home from school.  I have again reinforced the need to take his medications as directed. [facility] will be in touch, we have talked with them again by phone here in the office and verified his cell phone number. He is willing to have them come out and evaluate his current status.

## 2013-09-10 NOTE — Progress Notes (Deleted)
Name: Timothy Trujillo    DOB: [birth year]  Age: 63 y.o.  MR#: [id number]       PCP:  Fredirick Maudlin, MD      Insurance: Payor: [employer] MEDICARE / Plan: [employer] PLUS HMO THN/NTSP / Product Type: *No Product type* /   CC:    Chief Complaint  Patient presents with  . Congestive Heart Failure    Systolic    VS Filed Vitals:   09/10/13 1325  BP: 94/62  Pulse: 88  Height: 6\' 3"  (1.905 m)  Weight: 252 lb (114.306 kg)  SpO2: 94%    Weights Current Weight  09/10/13 252 lb (114.306 kg)  08/10/13 260 lb (117.935 kg)  08/01/13 265 lb 10.5 oz (120.5 kg)    Blood Pressure  BP Readings from Last 3 Encounters:  09/10/13 94/62  08/10/13 120/72  08/01/13 122/68     Admit date:  (Not on file) Last encounter with RMR:  08/10/2013   Allergy Baycol and Stadol  Current Outpatient Prescriptions  Medication Sig Dispense Refill  . aspirin 325 MG tablet Take 325 mg by mouth daily. *takes one tablet daily for heart health. Also takes additional one to two tablets as needed for arthritis pain associated with shoulder pain*      . Aspirin-Salicylamide-Caffeine (BC FAST PAIN RELIEF ARTHRITIS) 409-811-91 MG PACK Take 1 packet by mouth 3 (three) times daily as needed (for pain).      [name] atorvastatin (LIPITOR) 40 MG tablet Take 40 mg by mouth every evening.      . carvedilol (COREG) 6.25 MG tablet Take 1 tablet (6.25 mg total) by mouth 2 (two) times daily.  180 tablet  3  . cyclobenzaprine (FLEXERIL) 5 MG tablet Take 5 mg by mouth 3 (three) times daily as needed for muscle spasms.      . digoxin (LANOXIN) 0.25 MG tablet Take 1 tablet (0.25 mg total) by mouth daily.  30 tablet  12  . furosemide (LASIX) 80 MG tablet Take 80 mg by mouth 2 (two) times daily.      [name] HYDROcodone-acetaminophen (NORCO) 7.5-325 MG per tablet Take 1 tablet by mouth every 6 (six) hours as needed for moderate pain (pain in leg and shoulder).      . insulin NPH (HUMULIN N,NOVOLIN N) 100 UNIT/ML injection Inject 110 Units into the  skin 2 (two) times daily.      . isosorbide mononitrate (IMDUR) 60 MG 24 hr tablet Take 1 tablet (60 mg total) by mouth daily.  30 tablet  6  . KLOR-CON M20 20 MEQ tablet Take 20 mEq by mouth daily.       [name] lisinopril (PRINIVIL,ZESTRIL) 2.5 MG tablet Take 1 tablet (2.5 mg total) by mouth daily.  30 tablet  6  . Magnesium 250 MG TABS Take 250 tablets by mouth at bedtime.       . Methylcobalamin (B-12) 5000 MCG TBDP Take 1 tablet by mouth at bedtime.      . nitroGLYCERIN (NITROSTAT) 0.4 MG SL tablet Place 0.4 mg under the tongue every 5 (five) minutes as needed for chest pain.       [name] Nystatin Domestic POWD Apply powder to rash twice daily  1 each  11  . oxyCODONE-acetaminophen (PERCOCET/ROXICET) 5-325 MG per tablet Take 1 tablet by mouth 4 (four) times daily as needed for moderate pain or severe pain (for knee and shoulder).       [name] sulfamethoxazole-trimethoprim (BACTRIM DS,SEPTRA DS) 800-160 MG per tablet Take 1  tablet by mouth 2 (two) times daily.  60 tablet  11  . vitamin C (ASCORBIC ACID) 500 MG tablet Take 1,000 mg by mouth at bedtime.      . vitamin E 400 UNIT capsule Take 400 Units by mouth at bedtime.        No current facility-administered medications for this visit.    Discontinued Meds:   There are no discontinued medications.  Patient Active Problem List   Diagnosis Date Noted  . Secondary pulmonary hypertension 07/31/2013  . Aortic atherosclerosis 07/31/2013  . Unstable angina 07/30/2013  . Chronic systolic dysfunction of left ventricle 04/29/2013  . DNR no code (do not resuscitate) 04/29/2013  . Chest pain 04/19/2013  . Rotator cuff syndrome of left shoulder 02/24/2013  . Acute on chronic systolic heart failure 01/16/2013  . Ischemic cardiomyopathy 01/16/2013  . Sinus tachycardia 01/16/2013  . COPD, severe  O2 dependent 01/16/2013  . Cerebrovascular disease 01/08/2013  . Diabetes mellitus 06/12/2011  . Hypertension 06/12/2011  . Hyperlipidemia 06/12/2011  . Silicosis  06/12/2011  . Osteomyelitis of knee region 06/12/2011  . Cerebral artery occlusion   . Obesity   . Peripheral vascular disease     LABS    Component Value Date/Time   NA 137 07/31/2013 0430   NA 134* 07/30/2013 1510   NA 134* 07/05/2013 1625   K 4.2 07/31/2013 0430   K 4.3 07/30/2013 1510   K 5.6* 07/05/2013 1625   CL 97 07/31/2013 0430   CL 91* 07/30/2013 1510   CL 93* 07/05/2013 1625   CO2 24 07/31/2013 0430   CO2 29 07/30/2013 1510   CO2 29 07/05/2013 1625   GLUCOSE 324* 07/31/2013 0430   GLUCOSE 383* 07/30/2013 1510   GLUCOSE 296* 07/05/2013 1625   BUN 17 07/31/2013 0430   BUN 20 07/30/2013 1510   BUN 22 07/05/2013 1625   CREATININE 1.11 07/31/2013 0430   CREATININE 1.29 07/30/2013 1510   CREATININE 1.89* 07/05/2013 1625   CREATININE 1.04 04/20/2013 0711   CREATININE 1.29 07/19/2011 1207   CALCIUM 9.9 07/31/2013 0430   CALCIUM 10.1 07/30/2013 1510   CALCIUM 9.6 07/05/2013 1625   GFRNONAA 68* 07/31/2013 0430   GFRNONAA 57* 07/30/2013 1510   GFRNONAA 74* 04/20/2013 0711   GFRNONAA 59* 07/19/2011 1207   GFRAA 79* 07/31/2013 0430   GFRAA 66* 07/30/2013 1510   GFRAA 86* 04/20/2013 0711   GFRAA 68 07/19/2011 1207   CMP     Component Value Date/Time   NA 137 07/31/2013 0430   K 4.2 07/31/2013 0430   CL 97 07/31/2013 0430   CO2 24 07/31/2013 0430   GLUCOSE 324* 07/31/2013 0430   BUN 17 07/31/2013 0430   CREATININE 1.11 07/31/2013 0430   CREATININE 1.89* 07/05/2013 1625   CALCIUM 9.9 07/31/2013 0430   PROT 6.5 04/20/2013 0711   ALBUMIN 3.2* 04/20/2013 0711   AST 10 04/20/2013 0711   ALT 8 04/20/2013 0711   ALKPHOS 81 04/20/2013 0711   BILITOT 0.5 04/20/2013 0711   GFRNONAA 68* 07/31/2013 0430   GFRNONAA 59* 07/19/2011 1207   GFRAA 79* 07/31/2013 0430   GFRAA 68 07/19/2011 1207       Component Value Date/Time   WBC 7.5 08/01/2013 0310   WBC 7.2 07/31/2013 0430   WBC 7.7 07/30/2013 1510   HGB 12.6* 08/01/2013 0310   HGB 12.7* 07/31/2013 0430   HGB 13.6 07/30/2013 1510   HCT 36.2* 08/01/2013 0310   HCT  36.9* 07/31/2013 0430  HCT 38.6* 07/30/2013 1510   MCV 88.9 08/01/2013 0310   MCV 89.3 07/31/2013 0430   MCV 88.5 07/30/2013 1510    Lipid Panel     Component Value Date/Time   CHOL 245* 01/15/2013 0800   TRIG 340* 01/15/2013 0800   HDL 22* 01/15/2013 0800   CHOLHDL 11.1 01/15/2013 0800   VLDL 68* 01/15/2013 0800   LDLCALC 155* 01/15/2013 0800    ABG    Component Value Date/Time   PHART 7.336* 01/15/2013 1400   PCO2ART 45.6* 01/15/2013 1400   PO2ART 81.0 01/15/2013 1400   HCO3 24.4* 01/15/2013 1400   TCO2 26 01/15/2013 1400   ACIDBASEDEF 2.0 01/15/2013 1400   O2SAT 95.0 01/15/2013 1400     Lab Results  Component Value Date   TSH 1.460 07/30/2013   BNP (last 3 results)  Recent Labs  01/18/13 0500 04/19/13 2033 07/30/13 1523  PROBNP 1270.0* 1316.0* 604.2*   Cardiac Panel (last 3 results) No results found for this basename: CKTOTAL, CKMB, TROPONINI, RELINDX,  in the last 72 hours  Iron/TIBC/Ferritin No results found for this basename: iron, tibc, ferritin     EKG Orders placed during the hospital encounter of 07/30/13  . EKG 12-LEAD  . EKG 12-LEAD  . EKG     Prior Assessment and Plan Problem List as of 09/10/2013     Cardiovascular and Mediastinum   Cerebral artery occlusion   Peripheral vascular disease   Hypertension   Last Assessment & Plan   08/10/2013 Office Visit Written 08/10/2013  2:51 PM by Jodelle GrossKathryn M Lawrence, NP     Blood pressure is not optimal for a patient with an EF of 15%. Not make medication changes at this time in time certainly he is taking and at what doses. He is not active at all, dependent on wheelchair for ambulation. He is advised on a low sodium diet.    Cerebrovascular disease   Acute on chronic systolic heart failure   Last Assessment & Plan   08/10/2013 Office Visit Written 08/10/2013  2:49 PM by Jodelle GrossKathryn M Lawrence, NP     He does have some lower extremity dependent edema. He is trying to avoid salty foods, with an EF of 50% and  difficulty understanding his medication regimen this will be difficult to control. I have advised him and his grandson on a low sodium diet. Overall this gentleman has a poor prognosis based upon his social situation. [facility]. Skilled nursing facility placement may be an option for him after overall evaluation.     Ischemic cardiomyopathy   Last Assessment & Plan   05/03/2013 Office Visit Edited 05/03/2013  3:32 PM by Jodelle GrossKathryn M Lawrence, NP     He is stable. He has refused ICD and wishes to be DNR. I have given him info on obtaining Living Will from internet or from PCP. He will need to have this available should he be readmitted. In the interim, he will continue current medications. I will not go up on carvediolol at this time as he is is feeling lightheaded and is mildly hypotensive.   I have explained that he will need to have a low BP in the setting of EF of 12%, and will need to get used to medications causing him to feel lightheaded. He will need titration up on next visit with carvedilol if he can tolerate it. He is to see Dr. Wyline MoodBranch as he is established with him.He states that he doesn't  always take the lasix daily if he is going to be away from home, but will take it when he returns home.     Chronic systolic dysfunction of left ventricle   Last Assessment & Plan   08/10/2013 Office Visit Written 08/10/2013  2:48 PM by Jodelle Gross, NP     I am uncertain if he is taking 6.25 mg twice a day vs. 3.125 mg twice a day as he has one bottle of medication that he picked up post hospitalization but is only taking one pill twice a day. He does not know if it is higher dose or not. His grandson who is with him has very little understanding concerning his medication regimen. The patient is also confused on his dig ox and medication. He states the medicine that "begins with the D.".  I will have tried healthcare network  [facility]) contact him, to have Ms. Hayden Pedro RN come  by to see him go over his medications and make recommendations concerning his needs at home. I am concerned that a 53 year old grandson is his main caretaker, with very little understanding about his health status or medications.  He will return in one month, he will bring his medications with him, and I have reviewed all medications with him prior to him leaving. Ms. Les Pou RN has also highlighted the 2 medications he needs to be taking and their doses as he is bit confused about this.    Unstable angina   Secondary pulmonary hypertension   Aortic atherosclerosis     Respiratory   Silicosis   COPD, severe  O2 dependent     Endocrine   Diabetes mellitus   Last Assessment & Plan   08/10/2013 Office Visit Written 08/10/2013  2:52 PM by Jodelle Gross, NP     He will be followed by his primary care physician Dr. Juanetta Gosling. He states it is very difficult to control. He may be having some diabetic gastroparesis with abdominal pain and burping. Referral to GI specialist her PCP is recommended at his discretion      Musculoskeletal and Integument   Osteomyelitis of knee region   Last Assessment & Plan   09/04/2011 Office Visit Edited 09/04/2011 12:01 PM by Ginnie Smart, MD     Somewhat limited with Cx (-). WIll cont his IV therapy as previously written and then transition him to bactrim. He has seen Dr Shelle Iron who also referred him to [facility]. Will see him back in 6 weeks and consider MRI at that time. He will call if he has worsening in the intervening period.    Rotator cuff syndrome of left shoulder     Other   Obesity   Last Assessment & Plan   01/26/2013 Office Visit Written 01/26/2013  1:38 PM by Jodelle Gross, NP     Very sedentary uses wheel chair for ambulation,     Hyperlipidemia   Last Assessment & Plan   01/26/2013 Office Visit Written 01/26/2013  1:37 PM by Jodelle Gross, NP     Continue statin. Follow up labs in 3 months.    Sinus tachycardia   Chest pain   Last  Assessment & Plan   08/10/2013 Office Visit Written 08/10/2013  2:53 PM by Jodelle Gross, NP     Chronic for him. He is narcotic dependent.    DNR no code (do not resuscitate)       Imaging: No results found.

## 2013-09-28 ENCOUNTER — Other Ambulatory Visit (HOSPITAL_COMMUNITY): Payer: Self-pay | Admitting: Pulmonary Disease

## 2013-09-28 ENCOUNTER — Ambulatory Visit (HOSPITAL_COMMUNITY)
Admission: RE | Admit: 2013-09-28 | Discharge: 2013-09-28 | Disposition: A | Discharge status: Home / Self Care | Payer: Medicare HMO | Source: Ambulatory Visit | Attending: Pulmonary Disease | Admitting: Pulmonary Disease

## 2013-09-28 DIAGNOSIS — M549 Dorsalgia, unspecified: Secondary | ICD-10-CM | POA: Insufficient documentation

## 2013-12-14 ENCOUNTER — Encounter: Payer: Self-pay | Admitting: Cardiology

## 2013-12-14 ENCOUNTER — Ambulatory Visit (HOSPITAL_COMMUNITY)
Admission: RE | Admit: 2013-12-14 | Discharge: 2013-12-14 | Disposition: A | Discharge status: Home / Self Care | Payer: Medicare HMO | Source: Ambulatory Visit | Attending: Cardiology | Admitting: Cardiology

## 2013-12-14 ENCOUNTER — Ambulatory Visit (INDEPENDENT_AMBULATORY_CARE_PROVIDER_SITE_OTHER): Payer: Commercial Managed Care - HMO | Admitting: Cardiology

## 2013-12-14 VITALS — BP 110/60 | HR 80 | Ht 75.0 in | Wt 262.0 lb

## 2013-12-14 DIAGNOSIS — I2589 Other forms of chronic ischemic heart disease: Secondary | ICD-10-CM

## 2013-12-14 DIAGNOSIS — I517 Cardiomegaly: Secondary | ICD-10-CM | POA: Diagnosis not present

## 2013-12-14 DIAGNOSIS — R0602 Shortness of breath: Secondary | ICD-10-CM

## 2013-12-14 DIAGNOSIS — J8410 Pulmonary fibrosis, unspecified: Secondary | ICD-10-CM | POA: Diagnosis not present

## 2013-12-14 DIAGNOSIS — I255 Ischemic cardiomyopathy: Secondary | ICD-10-CM

## 2013-12-14 DIAGNOSIS — I519 Heart disease, unspecified: Secondary | ICD-10-CM

## 2013-12-14 DIAGNOSIS — I10 Essential (primary) hypertension: Secondary | ICD-10-CM

## 2013-12-14 DIAGNOSIS — E785 Hyperlipidemia, unspecified: Secondary | ICD-10-CM

## 2013-12-14 MED ORDER — ATORVASTATIN CALCIUM 80 MG PO TABS: 80.0000 mg | ORAL_TABLET | Freq: Every day | ORAL | Status: DC

## 2013-12-14 MED ORDER — ISOSORBIDE MONONITRATE ER 30 MG PO TB24: 30.0000 mg | ORAL_TABLET | Freq: Every day | ORAL | Status: DC

## 2013-12-14 NOTE — Patient Instructions (Addendum)
Your physician recommends that you schedule a follow-up appointment in: 2 months  PLEASE get chest x-ray now  Your physician has recommended you make the following change in your medication:   PLEASE INCREASE Lasix to 120 mg in the morning and 80 mg at nite for the next 3 days, then go back go back to 80 mg twice a day  DECREASE Aspirin 81 mg daily  DECREASE Imdur to 30 mg daily   INCREASE Lipitor to 80 mg daily      Thank you for choosing [facility] !

## 2013-12-14 NOTE — Progress Notes (Signed)
Clinical Summary Timothy Trujillo is a 63 y.o.male seen for follow up of the following medical problems.  1. CAD/ICM - hx of prior CABG 20 years ago at [facility], prior stenting - last cath 01/2013: LM 60%, LAD 60% mid, LCX prox patent stent with distal occluded LCX, RCA occluded. SVG-RCA occluded, SVG-OM occluded. - echo 01/2013 LVEF 15-20% - from last clinic patient with confusion about his medication regimen. Referred for [facility] but there has been troubles with them contacting him through the provided phone number - reports visited by [facility], had 4-5 visits. He using a pill box regularly now.  - breathing SOB since Fri, at rest and with exertion. No LE edema, + orthopnea. - weighs himself most days, typically around 255-60. Weight was 261 this morning.  - reports orthostatic symptoms with standing  2. HTN - checks 2-3 times a week, typically 120s/80s - compliant with meds  3. 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   5. DNR - patient established DNR status last admission, reaffirms this decision today   Past Medical History  Diagnosis Date  . Uncontrolled diabetes mellitus     a. A1C 12.8 in 01/2013.  [name] Chronic systolic heart failure   . Obesity   . Hyperlipidemia   . Peripheral vascular disease   . Hypertension   . Asthma   . CAD (coronary artery disease)     a. Prior CABG 20 yrs ago (?~1994). b. Hx of multiple stents, prev followed in [place]. c. [place] 01/2013: cath moderate disease in LAD, distal LCx, prox RCA occluded, 2 SVGs occluded, felt to be stable from prior cath -> for medical therapy.  . Ischemic cardiomyopathy     a. EF 2012: 55-60. b. EF 12/2011: 25-30%. c. Echo 01/2013: EF 15-20%.  . Stroke     15 years ago  . Arthritis   . Anxiety   . Depression   . 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 Chronic respiratory failure     a. Due to COPD.  .  Osteomyelitis 2013    was planned for chronic suppressive antibiotics but cannot afford this any longer  . DNR no code (do not resuscitate) 07/2013     Allergies  Allergen Reactions  . Baycol [Cerivastatin]   . Stadol [Butorphanol Tartrate] Other (See Comments)    "makes him crazy"     Current Outpatient Prescriptions  Medication Sig Dispense Refill  . aspirin 325 MG tablet Take 325 mg by mouth daily. *takes one tablet daily for heart health. Also takes additional one to two tablets as needed for arthritis pain associated with shoulder pain*      . Aspirin-Salicylamide-Caffeine (BC FAST PAIN RELIEF ARTHRITIS) 161-096-04 MG PACK Take 1 packet by mouth 3 (three) times daily as needed (for pain).      [name] atorvastatin (LIPITOR) 40 MG tablet Take 40 mg by mouth every evening.      . carvedilol (COREG) 6.25 MG tablet Take 1 tablet (6.25 mg total) by mouth 2 (two) times daily.  180 tablet  3  . cyclobenzaprine (FLEXERIL) 5 MG tablet Take 5 mg by mouth 3 (three) times daily as needed for muscle spasms.      . digoxin (LANOXIN) 0.25 MG tablet Take 1 tablet (0.25 mg total) by mouth daily.  30 tablet  12  . furosemide (LASIX)  80 MG tablet Take 80 mg by mouth 2 (two) times daily.      [name] HYDROcodone-acetaminophen (NORCO) 7.5-325 MG per tablet Take 1 tablet by mouth every 6 (six) hours as needed for moderate pain (pain in leg and shoulder).      . insulin NPH (HUMULIN N,NOVOLIN N) 100 UNIT/ML injection Inject 110 Units into the skin 2 (two) times daily.      . isosorbide mononitrate (IMDUR) 60 MG 24 hr tablet Take 1 tablet (60 mg total) by mouth daily.  30 tablet  6  . KLOR-CON M20 20 MEQ tablet Take 20 mEq by mouth daily.       [name] lisinopril (PRINIVIL,ZESTRIL) 2.5 MG tablet Take 1 tablet (2.5 mg total) by mouth daily.  30 tablet  6  . Magnesium 250 MG TABS Take 250 tablets by mouth at bedtime.       . Methylcobalamin (B-12) 5000 MCG TBDP Take 1 tablet by mouth at bedtime.      . nitroGLYCERIN  (NITROSTAT) 0.4 MG SL tablet Place 0.4 mg under the tongue every 5 (five) minutes as needed for chest pain.       [name] Nystatin Domestic POWD Apply powder to rash twice daily  1 each  11  . oxyCODONE-acetaminophen (PERCOCET/ROXICET) 5-325 MG per tablet Take 1 tablet by mouth 4 (four) times daily as needed for moderate pain or severe pain (for knee and shoulder).       [name] sulfamethoxazole-trimethoprim (BACTRIM DS,SEPTRA DS) 800-160 MG per tablet Take 1 tablet by mouth 2 (two) times daily.  60 tablet  11  . vitamin C (ASCORBIC ACID) 500 MG tablet Take 1,000 mg by mouth at bedtime.      . vitamin E 400 UNIT capsule Take 400 Units by mouth at bedtime.        No current facility-administered medications for this visit.     Past Surgical History  Procedure Laterality Date  . Coronary artery bypass graft    . Coronary stent placement  2008    CABG grafts closed  . Joint replacement    . Cholecystectomy    . Eye surgery    . I&d extremity  06/12/2011    Procedure: IRRIGATION AND DEBRIDEMENT EXTREMITY;  Surgeon: Javier Docker, MD;  Location: [facility] OR;  Service: Orthopedics;  Laterality: Right;  I&D Right Tibia     Allergies  Allergen Reactions  . Baycol [Cerivastatin]   . Stadol [Butorphanol Tartrate] Other (See Comments)    "makes him crazy"      Family History  Problem Relation Age of Onset  . Dementia Mother   . Arthritis Mother   . Heart disease Father   . Heart disease Brother      Social History Timothy Trujillo reports that he quit smoking about 30 years ago. He has never used smokeless tobacco. Timothy Trujillo reports that he does not drink alcohol.   Review of Systems CONSTITUTIONAL: No weight loss, fever, chills, weakness or fatigue.  HEENT: Eyes: No visual loss, blurred vision, double vision or yellow sclerae.No hearing loss, sneezing, congestion, runny nose or sore throat.  SKIN: No rash or itching.  CARDIOVASCULAR: per HPI RESPIRATORY: +SOB GASTROINTESTINAL: No anorexia,  nausea, vomiting or diarrhea. No abdominal pain or blood.  GENITOURINARY: No burning on urination, no polyuria NEUROLOGICAL: No headache, dizziness, syncope, paralysis, ataxia, numbness or tingling in the extremities. No change in bowel or bladder control.  MUSCULOSKELETAL: No muscle, back pain, joint pain or stiffness.  LYMPHATICS: No  enlarged nodes. No history of splenectomy.  PSYCHIATRIC: No history of depression or anxiety.  ENDOCRINOLOGIC: No reports of sweating, cold or heat intolerance. No polyuria or polydipsia.  [name]   Physical Examination p 80 bp 110/60  Wt 262 lbs BMI 33 Gen: resting comfortably, no acute distress HEENT: no scleral icterus, pupils equal round and reactive, no palptable cervical adenopathy,  CV Resp: Clear to auscultation bilaterally GI: abdomen is soft, non-tender, non-distended, normal bowel sounds, no hepatosplenomegaly MSK: extremities are warm, no edema.  Skin: warm, no rash Neuro:  no focal deficits Psych: appropriate affect   Diagnostic Studies 01/2013 Echo Study Conclusions  - Study data: Technically difficult study. - Left ventricle: The cavity size was mildly dilated. Wall thickness was normal. The estimated ejection fraction was in the range of 15% to 20%. This is significantly worst compared to prior study May 16,2012. The study is not technically sufficient to allow evaluation of LV diastolic function. Internal dimension: 63mm (ED, PLAX). - Aortic valve: Mildly calcified annulus. Mildly thickened leaflets. Uncertain number of leaflets. Valve area: 2.87cm^2(VTI). Valve area: 3.1cm^2 (Vmax). - Left atrium: The atrium was severely dilated. - Right ventricle: The cavity size was mildly to moderately dilated. Systolic function was moderately reduced. RV TAPSE is 1.2 cm. - Right atrium: The atrium was moderately dilated. - Pulmonary arteries: Systolic pressure was moderately increased. PA peak pressure: 60mm Hg (S).   01/2013  Cath Hemodynamics:  RA: 10/0/8  RV: 42/6  PCWP: 13/14/13  PA: 45/22 (30)  Cardiac Output  Thermodilution: 4.63 Index 1.82  Fick : 5.22 Index 2.06  Arterial Sat: 95%  PA Sat: 60%.  LV pressure: 110//21  Aortic pressure: 110/66  Angiography  Left Main: 60% ostial LM, moderate irreg  Left anterior Descending: diffusely diseased, 20-40% throughout, 60% mid stenosis just after the large 1st diag  Left Circumflex: proximal stent, patent with small amount of ISR, The distal LCX is occluded after giving off a moderate sized OM  Right Coronary Artery: occluded prox  SVG to RCA: Occluded  SVG to ? OM ( vs. LAD) : Occluded prox.  There are no clips to suggest that he has a LIMA. There is no competitive flow in the LAD to suggest a graft.  LV Gram: not performed due to the risk of toxic contrast nephropathy  Complications: No apparent complications  Patient did tolerate procedure well.  Contrast used: 45 cc  Conclusions:  1. CAD: The LM is ~ 60% . The LAD has mild - moderate irregularities. The distal LCx is occluded and the prox RCA is occluded. Both SVGs are occluded  2. Poor LV function by echo. LVG was not done in order to save contrast.  Given his risk factors ( poorly controlled DM, CHF) I think medical management is indicated.  He will follow up with Dr. Wyline Mood.     Assessment and Plan  1. CAD/ICM/Chronic systolic HF - LVEF 15-20% by echo 01/2013, he is NYHA III-IV. He does not have an ICD as he is DNR and does not want such measures - medical therapy has been limited due to medication compliance and low blood pressures. Now seems to be using pill box with improved compliance - reports orthostatic symptoms, will decresae imdur, continue coreg at current dose - describes some weight gain, SOB, and orthopnea over the last 3-4 days. Will increase lasix to 120 in AM and  in pm for 3 days, then back to  bid - send for CXR  2. HTN -  at goal, continue current meds  3.  Hyperlipidemia - change to high dose statin given known hx of CAD  4. Silicosis - continue home O2, management per pulmonary  5. DNR - reiterates this status today     Antoine Poche, M.D., F.A.C.C.

## 2014-02-14 ENCOUNTER — Encounter: Payer: Self-pay | Admitting: Cardiology

## 2014-02-14 ENCOUNTER — Encounter: Payer: Commercial Managed Care - HMO | Admitting: Cardiology

## 2014-02-14 NOTE — Progress Notes (Signed)
Clinical Summary [name] is a 63 y.o.male seen today for follow up of the following medical problems.   1. CAD/ICM - hx of prior [facility], prior stenting - last cath 01/2013: LM 60%, LAD 60% mid, LCX prox patent stent with distal occluded LCX, RCA occluded. SVG-RCA occluded, SVG-OM occluded. - echo 01/2013 LVEF 15-20% - from last clinic patient with confusion about his medication regimen. [facility] for [facility] but there has been troubles with them contacting him through the provided phone number - [facility], had 4-5 visits. He using a pill box regularly now.  - breathing SOB since Fri, at rest and with exertion. No LE edema, + orthopnea. - weighs himself most days, typically around 255-60. Weight was 261 this morning.  - reports orthostatic symptoms with standing  - last visit was having some orthostatic symptoms, we decreased his imdur  2. HTN - checks 2-3 times a week, typically 120s/80s - compliant with meds  3. 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   5. DNR - patient established DNR status last admission, reaffirms this decision today  Past Medical History  Diagnosis Date  . Uncontrolled diabetes mellitus     a. A1C 12.8 in 01/2013.  [name]. Chronic systolic heart failure   . Obesity   . Hyperlipidemia   . Peripheral vascular disease   . Hypertension   . Asthma   . CAD (coronary artery disease)     a. Prior CABG 20 yrs ago (?~1994). b. Hx of [place] st[place], [place] 01[place]: cath moderate disease in LAD, distal LCx, prox RCA occluded, 2 SVGs occluded, felt to be stable from prior cath -> for medical therapy.  . Ischemic cardiomyopathy     a. EF 2012: 55-60. b. EF 12/2011: 25-30%. c. Echo 01/2013: EF 15-20%.  . Stroke     15 years ago  . Arthritis   . Anxiety   . Depression   . 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 Chronic respiratory failure     a. Due to COPD.  . Osteomyelitis 2013    was planned for chronic suppressive antibiotics but cannot afford this any longer  . DNR no code (do not resuscitate) 07/2013     Allergies  Allergen Reactions  . Baycol [Cerivastatin]   . Stadol [Butorphanol Tartrate] Other (See Comments)    "makes him crazy"     Current Outpatient Prescriptions  Medication Sig Dispense Refill  . aspirin 81 MG tablet Take 81 mg by mouth daily.    . Aspirin-Salicylamide-Caffeine (BC FAST PAIN RELIEF ARTHRITIS) [phone] MG PACK Take 1 packet by mouth 3 (three) times daily as needed (for pain).    [name]. atorvastatin (LIPITOR) 80 MG tablet Take 1 tablet (80 mg total) by mouth daily. 90 tablet 3  . carvedilol (COREG) 6.25 MG tablet Take 1 tablet (6.25 mg total) by mouth 2 (two) times daily. 180 tablet 3  . cyclobenzaprine (FLEXERIL) 5 MG tablet Take 5 mg by mouth 3 (three) times daily as needed for muscle spasms.    . digoxin (LANOXIN) 0.25 MG tablet Take 1 tablet (0.25 mg total) by mouth daily. 30 tablet 12  . furosemide (LASIX) 80 MG tablet Take 80 mg by mouth 2 (two) times daily.    [name]. HYDROcodone-acetaminophen (NORCO)  7.5-325 MG per tablet Take 1 tablet by mouth every 6 (six) hours as needed for moderate pain (pain in leg and shoulder).    . insulin NPH (HUMULIN N,NOVOLIN N) 100 UNIT/ML injection Inject 110 Units into the skin 2 (two) times daily.    . isosorbide mononitrate (IMDUR) 30 MG 24 hr tablet Take 1 tablet (30 mg total) by mouth daily. 90 tablet 3  . KLOR-CON M20 20 MEQ tablet Take 20 mEq by mouth daily.     [name] lisinopril (PRINIVIL,ZESTRIL) 2.5 MG tablet Take 1 tablet (2.5 mg total) by mouth daily. 30 tablet 6  . Magnesium 250 MG TABS Take 250 tablets by mouth at bedtime.     . Methylcobalamin (B-12) 5000 MCG TBDP Take 1 tablet by mouth at bedtime.    . nitroGLYCERIN (NITROSTAT) 0.4 MG SL tablet Place 0.4 mg under the tongue every 5 (five)  minutes as needed for chest pain.     [name] Nystatin Domestic POWD Apply powder to rash twice daily 1 each 11  . oxyCODONE-acetaminophen (PERCOCET/ROXICET) 5-325 MG per tablet Take 1 tablet by mouth 4 (four) times daily as needed for moderate pain or severe pain (for knee and shoulder).     [name] sulfamethoxazole-trimethoprim (BACTRIM DS,SEPTRA DS) 800-160 MG per tablet Take 1 tablet by mouth 2 (two) times daily. 60 tablet 11  . vitamin C (ASCORBIC ACID) 500 MG tablet Take 1,000 mg by mouth at bedtime.    . vitamin E 400 UNIT capsule Take 400 Units by mouth at bedtime.      No current facility-administered medications for this visit.     Past Surgical History  Procedure Laterality Date  . Coronary artery bypass graft    . Coronary stent placement  2008    CABG grafts closed  . Joint replacement    . Cholecystectomy    . Eye surgery    . I&d extremity  06/12/2011    Procedure: IRRIGATION AND DEBRIDEMENT EXTREMITY;  Surgeon: Javier Docker, MD;  Location: [facility] OR;  Service: Orthopedics;  Laterality: Right;  I&D Right Tibia     Allergies  Allergen Reactions  . Baycol [Cerivastatin]   . Stadol [Butorphanol Tartrate] Other (See Comments)    "makes him crazy"      Family History  Problem Relation Age of Onset  . Dementia Mother   . Arthritis Mother   . Heart disease Father   . Heart disease Brother      Social History Mr. Highfill reports that he quit smoking about 30 years ago. He has never used smokeless tobacco. Mr. Patmon reports that he does not drink alcohol.   Review of Systems CONSTITUTIONAL: No weight loss, fever, chills, weakness or fatigue.  HEENT: Eyes: No visual loss, blurred vision, double vision or yellow sclerae.No hearing loss, sneezing, congestion, runny nose or sore throat.  SKIN: No rash or itching.  CARDIOVASCULAR:  RESPIRATORY: No shortness of breath, cough or sputum.  GASTROINTESTINAL: No anorexia, nausea, vomiting or diarrhea. No abdominal pain or blood.    GENITOURINARY: No burning on urination, no polyuria NEUROLOGICAL: No headache, dizziness, syncope, paralysis, ataxia, numbness or tingling in the extremities. No change in bowel or bladder control.  MUSCULOSKELETAL: No muscle, back pain, joint pain or stiffness.  LYMPHATICS: No enlarged nodes. No history of splenectomy.  PSYCHIATRIC: No history of depression or anxiety.  ENDOCRINOLOGIC: No reports of sweating, cold or heat intolerance. No polyuria or polydipsia.  [name]   Physical Examination There were no vitals filed  for this visit. There were no vitals filed for this visit.  Gen: resting comfortably, no acute distress HEENT: no scleral icterus, pupils equal round and reactive, no palptable cervical adenopathy,  CV Resp: Clear to auscultation bilaterally GI: abdomen is soft, non-tender, non-distended, normal bowel sounds, no hepatosplenomegaly MSK: extremities are warm, no edema.  Skin: warm, no rash Neuro:  no focal deficits Psych: appropriate affect   Diagnostic Studies 01/2013 Echo Study Conclusions  - Study data: Technically difficult study. - Left ventricle: The cavity size was mildly dilated. Wall thickness was normal. The estimated ejection fraction was in the range of 15% to 20%. This is significantly worst compared to prior study May 16,2012. The study is not technically sufficient to allow evaluation of LV diastolic function. Internal dimension: 63mm (ED, PLAX). - Aortic valve: Mildly calcified annulus. Mildly thickened leaflets. Uncertain number of leaflets. Valve area: 2.87cm^2(VTI). Valve area: 3.1cm^2 (Vmax). - Left atrium: The atrium was severely dilated. - Right ventricle: The cavity size was mildly to moderately dilated. Systolic function was moderately reduced. RV TAPSE is 1.2 cm. - Right atrium: The atrium was moderately dilated. - Pulmonary arteries: Systolic pressure was moderately increased. PA peak pressure: 60mm Hg (S).   01/2013  Cath Hemodynamics:  RA: 10/0/8  RV: 42/6  PCWP: 13/14/13  PA: 45/22 (30)  Cardiac Output  Thermodilution: 4.63 Index 1.82  Fick : 5.22 Index 2.06  Arterial Sat: 95%  PA Sat: 60%.  LV pressure: 110//21  Aortic pressure: 110/66  Angiography  Left Main: 60% ostial LM, moderate irreg  Left anterior Descending: diffusely diseased, 20-40% throughout, 60% mid stenosis just after the large 1st diag  Left Circumflex: proximal stent, patent with small amount of ISR, The distal LCX is occluded after giving off a moderate sized OM  Right Coronary Artery: occluded prox  SVG to RCA: Occluded  SVG to ? OM ( vs. LAD) : Occluded prox.  There are no clips to suggest that he has a LIMA. There is no competitive flow in the LAD to suggest a graft.  LV Gram: not performed due to the risk of toxic contrast nephropathy  Complications: No apparent complications  Patient did tolerate procedure well.  Contrast used: 45 cc  Conclusions:  1. CAD: The LM is ~ 60% . The LAD has mild - moderate irregularities. The distal LCx is occluded and the prox RCA is occluded. Both SVGs are occluded  2. Poor LV function by echo. LVG was not done in order to save contrast.  Given his risk factors ( poorly controlled DM, CHF) I think medical management is indicated.  He will follow up with Dr. Wyline MoodBranch.      Assessment and Plan  1. CAD/ICM/Chronic systolic HF - LVEF 15-20% by echo 01/2013, he is NYHA III-IV. He does not have an ICD as he is DNR and does not want such measures - medical therapy has been limited due to medication compliance and low blood pressures. Now seems to be using pill box with improved compliance - reports orthostatic symptoms, will decresae imdur, continue coreg at current dose - describes some weight gain, SOB, and orthopnea over the last 3-4 days. Will increase lasix to 120 in AM and 80mg  in pm for 3 days, then back to 80mg  bid - send for CXR  2. HTN - at goal, continue  current meds  3. Hyperlipidemia - change to high dose statin given known hx of CAD  4. Silicosis - continue home O2, management per pulmonary  5. DNR - reiterates this status today      Antoine Poche, M.D.

## 2014-02-25 ENCOUNTER — Emergency Department (HOSPITAL_COMMUNITY): Payer: Medicare HMO

## 2014-02-25 ENCOUNTER — Encounter (HOSPITAL_COMMUNITY): Payer: Self-pay | Admitting: *Deleted

## 2014-02-25 ENCOUNTER — Other Ambulatory Visit: Payer: Self-pay | Admitting: Physician Assistant

## 2014-02-25 ENCOUNTER — Inpatient Hospital Stay (HOSPITAL_COMMUNITY)
Admission: EM | Admit: 2014-02-25 | Discharge: 2014-03-01 | DRG: 313 | Disposition: A | Discharge status: Home / Self Care | Payer: Medicare HMO | Attending: Pulmonary Disease | Admitting: Pulmonary Disease

## 2014-02-25 DIAGNOSIS — M75102 Unspecified rotator cuff tear or rupture of left shoulder, not specified as traumatic: Secondary | ICD-10-CM | POA: Diagnosis present

## 2014-02-25 DIAGNOSIS — Z955 Presence of coronary angioplasty implant and graft: Secondary | ICD-10-CM

## 2014-02-25 DIAGNOSIS — Z951 Presence of aortocoronary bypass graft: Secondary | ICD-10-CM

## 2014-02-25 DIAGNOSIS — R079 Chest pain, unspecified: Principal | ICD-10-CM | POA: Diagnosis present

## 2014-02-25 DIAGNOSIS — Z9981 Dependence on supplemental oxygen: Secondary | ICD-10-CM

## 2014-02-25 DIAGNOSIS — I10 Essential (primary) hypertension: Secondary | ICD-10-CM | POA: Diagnosis present

## 2014-02-25 DIAGNOSIS — Z66 Do not resuscitate: Secondary | ICD-10-CM | POA: Diagnosis present

## 2014-02-25 DIAGNOSIS — I951 Orthostatic hypotension: Secondary | ICD-10-CM | POA: Diagnosis present

## 2014-02-25 DIAGNOSIS — J449 Chronic obstructive pulmonary disease, unspecified: Secondary | ICD-10-CM | POA: Diagnosis present

## 2014-02-25 DIAGNOSIS — J9610 Chronic respiratory failure, unspecified whether with hypoxia or hypercapnia: Secondary | ICD-10-CM | POA: Diagnosis present

## 2014-02-25 DIAGNOSIS — J628 Pneumoconiosis due to other dust containing silica: Secondary | ICD-10-CM | POA: Diagnosis present

## 2014-02-25 DIAGNOSIS — I5022 Chronic systolic (congestive) heart failure: Secondary | ICD-10-CM | POA: Diagnosis present

## 2014-02-25 DIAGNOSIS — R739 Hyperglycemia, unspecified: Secondary | ICD-10-CM

## 2014-02-25 DIAGNOSIS — E669 Obesity, unspecified: Secondary | ICD-10-CM | POA: Diagnosis present

## 2014-02-25 DIAGNOSIS — Z8673 Personal history of transient ischemic attack (TIA), and cerebral infarction without residual deficits: Secondary | ICD-10-CM

## 2014-02-25 DIAGNOSIS — Z6830 Body mass index (BMI) 30.0-30.9, adult: Secondary | ICD-10-CM

## 2014-02-25 DIAGNOSIS — E1165 Type 2 diabetes mellitus with hyperglycemia: Secondary | ICD-10-CM | POA: Diagnosis present

## 2014-02-25 DIAGNOSIS — Z7982 Long term (current) use of aspirin: Secondary | ICD-10-CM

## 2014-02-25 DIAGNOSIS — J45909 Unspecified asthma, uncomplicated: Secondary | ICD-10-CM | POA: Diagnosis present

## 2014-02-25 DIAGNOSIS — E785 Hyperlipidemia, unspecified: Secondary | ICD-10-CM | POA: Diagnosis present

## 2014-02-25 DIAGNOSIS — E119 Type 2 diabetes mellitus without complications: Secondary | ICD-10-CM

## 2014-02-25 DIAGNOSIS — Z888 Allergy status to other drugs, medicaments and biological substances status: Secondary | ICD-10-CM

## 2014-02-25 DIAGNOSIS — I255 Ischemic cardiomyopathy: Secondary | ICD-10-CM | POA: Diagnosis present

## 2014-02-25 DIAGNOSIS — I2510 Atherosclerotic heart disease of native coronary artery without angina pectoris: Secondary | ICD-10-CM | POA: Diagnosis present

## 2014-02-25 DIAGNOSIS — I739 Peripheral vascular disease, unspecified: Secondary | ICD-10-CM | POA: Diagnosis present

## 2014-02-25 HISTORY — DX: Type 2 diabetes mellitus without complications: E11.9

## 2014-02-25 HISTORY — DX: Heart failure, unspecified: I50.9

## 2014-02-25 LAB — COMPREHENSIVE METABOLIC PANEL
ALT: 11 U/L (ref 0–53)
AST: 11 U/L (ref 0–37)
Albumin: 3.6 g/dL (ref 3.5–5.2)
Alkaline Phosphatase: 90 U/L (ref 39–117)
Anion gap: 13 (ref 5–15)
BUN: 18 mg/dL (ref 6–23)
CALCIUM: 9.7 mg/dL (ref 8.4–10.5)
CO2: 29 mEq/L (ref 19–32)
Chloride: 93 mEq/L — ABNORMAL LOW (ref 96–112)
Creatinine, Ser: 0.97 mg/dL (ref 0.50–1.35)
GFR calc non Af Amer: 85 mL/min — ABNORMAL LOW (ref >90)
GLUCOSE: 418 mg/dL — AB (ref 70–99)
Potassium: 4.3 mEq/L (ref 3.7–5.3)
SODIUM: 135 meq/L — AB (ref 137–147)
TOTAL PROTEIN: 6.8 g/dL (ref 6.0–8.3)
Total Bilirubin: 0.3 mg/dL (ref 0.3–1.2)

## 2014-02-25 LAB — CBC WITH DIFFERENTIAL/PLATELET
BASOS ABS: 0 10*3/uL (ref 0.0–0.1)
BASOS PCT: 0 % (ref 0–1)
EOS ABS: 0.1 10*3/uL (ref 0.0–0.7)
Eosinophils Relative: 1 % (ref 0–5)
HCT: 38.5 % — ABNORMAL LOW (ref 39.0–52.0)
Hemoglobin: 13.5 g/dL (ref 13.0–17.0)
Lymphocytes Relative: 14 % (ref 12–46)
Lymphs Abs: 1.2 10*3/uL (ref 0.7–4.0)
MCH: 30.5 pg (ref 26.0–34.0)
MCHC: 35.1 g/dL (ref 30.0–36.0)
MCV: 86.9 fL (ref 78.0–100.0)
Monocytes Absolute: 0.5 10*3/uL (ref 0.1–1.0)
Monocytes Relative: 6 % (ref 3–12)
NEUTROS PCT: 79 % — AB (ref 43–77)
Neutro Abs: 6.4 10*3/uL (ref 1.7–7.7)
PLATELETS: 230 10*3/uL (ref 150–400)
RBC: 4.43 MIL/uL (ref 4.22–5.81)
RDW: 13.6 % (ref 11.5–15.5)
WBC: 8.3 10*3/uL (ref 4.0–10.5)

## 2014-02-25 LAB — PRO B NATRIURETIC PEPTIDE: Pro B Natriuretic peptide (BNP): 1014 pg/mL — ABNORMAL HIGH (ref 0–125)

## 2014-02-25 LAB — TROPONIN I: Troponin I: <0.3 ng/mL (ref <0.30)

## 2014-02-25 LAB — DIGOXIN LEVEL: Digoxin Level: <0.3 ng/mL — ABNORMAL LOW (ref 0.8–2.0)

## 2014-02-25 MED ORDER — NITROGLYCERIN 0.4 MG SL SUBL
0.4000 mg | SUBLINGUAL_TABLET | SUBLINGUAL | Status: AC | PRN
  Administered 2014-02-25 – 2014-02-26 (×3): 0.4 mg via SUBLINGUAL
  Filled 2014-02-25 (×2): qty 1

## 2014-02-25 MED ORDER — ACETAMINOPHEN 325 MG PO TABS
650.0000 mg | ORAL_TABLET | Freq: Once | ORAL | Status: AC
  Administered 2014-02-25: 650 mg via ORAL
  Filled 2014-02-25: qty 2

## 2014-02-25 NOTE — ED Notes (Signed)
Pt c/o headache after taking nitroglycerin.

## 2014-02-25 NOTE — ED Notes (Signed)
When getting up from toilet, had onset of mid- left chest pain.  Larey SeatFell on way to his room to take NTG tab.  Was able to get up and get to room.  Took NTG w/pain going 8/10 to 6/10.  By the time EMS arrived pain was at a 3/10.  Has hx of CHF, CVA, MI, DM.

## 2014-02-25 NOTE — ED Notes (Signed)
MD at bedside. 

## 2014-02-25 NOTE — ED Notes (Signed)
Pt reports dizziness has eased up some. Able to speak clearly. NAD noted at present.

## 2014-02-25 NOTE — ED Notes (Signed)
Pt reports extreme lightheadness. Denies any increase/decrease in chest pain.

## 2014-02-25 NOTE — ED Provider Notes (Signed)
CSN: [id number]     Arrival date & time 02/25/14  2208 History  This chart was scribe for Joya Gaskinsonald W Dilraj Killgore, MD by Angelene GiovanniEmmanuella Mensah, ED Scribe. The patient was seen in room APA14/APA14 and the patient's care was started at 11:24 PM.    Chief Complaint  Patient presents with  . Chest Pain   Patient is a 63 y.o. male presenting with chest pain. The history is provided by the patient and a relative. No language interpreter was used.  Chest Pain Pain location:  L chest Pain radiates to:  Neck Onset quality:  Gradual Duration: several hours ago. Progression:  Worsening Chronicity:  Recurrent Relieved by:  Aspirin and nitroglycerin Worsened by:  Exertion Associated symptoms: dizziness, headache and shortness of breath   Associated symptoms: no abdominal pain    HPI Comments: Huntley DecJames L Decou is a 63 y.o. male brought in by ambulance, who presents to the Emergency Department complaining of a gradually worsening mid left 2/10 CP that radiates to his neck. He reports that the pain has been there for a while but worse today when he went to use bathroom. He adds that when he usually stands up, his oxygen drops and he fells dizzy. He reports associated dizziness, HA, loss of appetite, and SOB. He is unsure if he experienced LOC since he was able to call 911. He denies LE edema, abdominal pain, or weakness/numbness in his extremities. He reports taking 4 baby aspirins PTA.  PMH - CAD Soc hx - lives at home  Past Medical History  Diagnosis Date  . Uncontrolled diabetes mellitus     a. A1C 12.8 in 01/2013.  [name]. Chronic systolic heart failure   . Obesity   . Hyperlipidemia   . Peripheral vascular disease   . Hypertension   . Asthma   . CAD (coronary artery disease)     a. Prior CABG 20 yrs ago (?~1994). b. [place]x [place], [place]: cath moderate disease in LAD, distal LCx, prox RCA occluded, 2 SVGs occluded, felt to be stable from prior cath -> for medical  therapy.  . Ischemic cardiomyopathy     a. EF 2012: 55-60. b. EF 12/2011: 25-30%. c. Echo 01/2013: EF 15-20%.  . Stroke     15 years ago  . Arthritis   . Anxiety   . Depression   . 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 Chronic respiratory failure     a. Due to COPD.  . Osteomyelitis 2013    was planned for chronic suppressive antibiotics but cannot afford this any longer  . DNR no code (do not resuscitate) 07/2013   Past Surgical History  Procedure Laterality Date  . Coronary artery bypass graft    . Coronary stent placement  2008    CABG grafts closed  . Joint replacement    . Cholecystectomy    . Eye surgery    . I&d extremity  06/12/2011    Procedure: IRRIGATION AND DEBRIDEMENT EXTREMITY;  Surgeon: Javier DockerJeffrey C Beane, MD;  Location: [facility] OR;  Service: Orthopedics;  Laterality: Right;  I&D Right Tibia   Family History  Problem Relation Age of Onset  . Dementia Mother   . Arthritis Mother   . Heart disease Father   . Heart disease Brother    History  Substance Use Topics  . Smoking status: Former Smoker -- 3.00 packs/day  Quit date: 04/09/1983  . Smokeless tobacco: Never Used  . Alcohol Use: No    Review of Systems  Constitutional: Positive for appetite change.  Respiratory: Positive for shortness of breath.   Cardiovascular: Positive for chest pain. Negative for leg swelling.  Gastrointestinal: Negative for abdominal pain.  Neurological: Positive for dizziness and headaches.  All other systems reviewed and are negative.     Allergies  Baycol and Stadol  Home Medications   Prior to Admission medications   Medication Sig Start Date End Date Taking? Authorizing Provider  aspirin EC 81 MG tablet Take 81 mg by mouth daily.   Yes Historical Provider, MD  nitroGLYCERIN (NITROSTAT) 0.4 MG SL tablet Place 0.4 mg under the tongue every 5 (five) minutes as needed for chest pain.    Yes Historical Provider, MD   Aspirin-Salicylamide-Caffeine (BC FAST PAIN RELIEF ARTHRITIS) [id number] [id number] MG PACK Take 1 packet by mouth 3 (three) times daily as needed (for pain).    Historical Provider, MD  atorvastatin (LIPITOR) 80 MG tablet Take 1 tablet (80 mg total) by mouth daily. 12/14/13   Antoine PocheJonathan F Branch, MD  carvedilol (COREG) 6.25 MG tablet Take 1 tablet (6.25 mg total) by mouth 2 (two) times daily. 08/10/13   Jodelle GrossKathryn M Lawrence, NP  clopidogrel (PLAVIX) 75 MG tablet Take 75 mg by mouth daily.  02/25/14   Historical Provider, MD  cyclobenzaprine (FLEXERIL) 5 MG tablet Take 5 mg by mouth 3 (three) times daily as needed for muscle spasms.    Historical Provider, MD  digoxin (LANOXIN) 0.25 MG tablet Take 1 tablet (0.25 mg total) by mouth daily. 04/22/13   Fredirick MaudlinEdward L Hawkins, MD  furosemide (LASIX) 80 MG tablet Take 80 mg by mouth 2 (two) times daily.    Historical Provider, MD  HYDROcodone-acetaminophen (NORCO) 7.5-325 MG per tablet Take 1 tablet by mouth every 6 (six) hours as needed for moderate pain (pain in leg and shoulder).    Historical Provider, MD  insulin NPH (HUMULIN N,NOVOLIN N) 100 UNIT/ML injection Inject 110 Units into the skin 2 (two) times daily.    Historical Provider, MD  isosorbide dinitrate (ISORDIL) 20 MG tablet Take 20 mg by mouth 3 (three) times daily.  02/25/14   Historical Provider, MD  isosorbide mononitrate (IMDUR) 30 MG 24 hr tablet Take 1 tablet (30 mg total) by mouth daily. 12/14/13   Antoine PocheJonathan F Branch, MD  KLOR-CON M20 20 MEQ tablet Take 20 mEq by mouth daily.  01/07/13   Historical Provider, MD  lisinopril (PRINIVIL,ZESTRIL) 2.5 MG tablet TAKE ONE TABLET BY MOUTH ONCE DAILY 02/25/14   Antoine PocheJonathan F Branch, MD  Magnesium 250 MG TABS Take 250 tablets by mouth at bedtime.     Historical Provider, MD  Methylcobalamin (B-12) 5000 MCG TBDP Take 1 tablet by mouth at bedtime.    Historical Provider, MD  Nystatin Domestic POWD Apply powder to rash twice daily 07/05/13   Cliffton AstersJohn Campbell, MD   oxyCODONE-acetaminophen (PERCOCET/ROXICET) 5-325 MG per tablet Take 1 tablet by mouth 4 (four) times daily as needed for moderate pain or severe pain (for knee and shoulder).     Historical Provider, MD  sulfamethoxazole-trimethoprim (BACTRIM DS,SEPTRA DS) 800-160 MG per tablet Take 1 tablet by mouth 2 (two) times daily. Patient not taking: Reported on 02/25/2014 07/05/13   Cliffton AstersJohn Campbell, MD  vitamin C (ASCORBIC ACID) 500 MG tablet Take 1,000 mg by mouth at bedtime.    Historical Provider, MD  vitamin E 400 UNIT capsule Take 400 Units  by mouth at bedtime.     Historical Provider, MD   BP 128/76 mmHg  Pulse 84  Temp(Src) 97.9 F (36.6 C) (Oral)  Resp 22  SpO2 98% Physical Exam  Nursing note and vitals reviewed.  CONSTITUTIONAL: elderly, frail HEAD: Normocephalic/atraumatic EYES: EOMI/PERRL ENMT: Mucous membranes moist NECK: supple no meningeal signs SPINE/BACK:entire spine nontender CV: S1/S2 noted LUNGS: Lungs are clear to auscultation bilaterally, no apparent distress ABDOMEN: soft, nontender, no rebound or guarding, bowel sounds noted throughout abdomen GU:no cva tenderness NEURO: Pt is awake/alert/appropriate, moves all extremitiesx4.  No facial droop.   EXTREMITIES: pulses normal/equal, full ROM SKIN: warm, color normal PSYCH: no abnormalities of mood noted, alert and oriented to situation   ED Course  Procedures  DIAGNOSTIC STUDIES: Oxygen Saturation is 100% on , normal by my interpretation.    COORDINATION OF CARE: 11:29 PM- Pt advised of plan for treatment and pt agrees.    12:16 AM Initial labs/imaging unremarkable Pt has complex medical history including significant h/o CAD Will admit for further monitoring 12:21 AM  D/w Dr Sharl Ma Plan to admit to tele observation Will continue to monitor chest pain and monitor troponin levels Per chart, pt is a DNR BP 150/88 mmHg  Pulse 86  Temp(Src) 97.9 F (36.6 C) (Oral)  Resp 16  SpO2 100%  Labs Review Labs  Reviewed  CBC WITH DIFFERENTIAL - Abnormal; Notable for the following:    HCT 38.5 (*)    Neutrophils Relative % 79 (*)    All other components within normal limits  PRO B NATRIURETIC PEPTIDE - Abnormal; Notable for the following:    Pro B Natriuretic peptide (BNP) 1014.0 (*)    All other components within normal limits  COMPREHENSIVE METABOLIC PANEL - Abnormal; Notable for the following:    Sodium 135 (*)    Chloride 93 (*)    Glucose, Bld 418 (*)    GFR calc non Af Amer 85 (*)    All other components within normal limits  TROPONIN I    Imaging Review Dg Chest Portable 1 View  02/25/2014   CLINICAL DATA:  Mid to left-sided chest pain, radiating to the left shoulder, neck, and jaw. Shortness of breath today.  EXAM: PORTABLE CHEST - 1 VIEW  COMPARISON:  12/14/2013  FINDINGS: Postoperative changes in the mediastinum. Shallow inspiration. Cardiac enlargement. Pulmonary vascularity appears normal. Diffuse interstitial pattern to the lungs consistent with chronic infiltration or fibrosis and unchanged since prior studies. No focal consolidation. No blunting of costophrenic angles. No pneumothorax. Calcification of the aorta.  IMPRESSION: Cardiac enlargement. Unchanged appearance of chronic interstitial lung disease.   Electronically Signed   By: Burman Nieves M.D.   On: 02/25/2014 22:55     EKG Interpretation   Date/Time:  Friday February 25 2014 22:13:47 EST Ventricular Rate:  86 PR Interval:  157 QRS Duration: 102 QT Interval:  387 QTC Calculation: 463 R Axis:   41 Text Interpretation:  Sinus rhythm Abnormal T, consider ischemia, lateral  leads Baseline wander in lead(s) V1 changes noted in lateral leads  Confirmed by Bebe Shaggy  MD, Noora Locascio ([id number]) on 02/25/2014 11:24:22 PM     Medications  nitroGLYCERIN (NITROSTAT) SL tablet 0.4 mg (0.4 mg Sublingual Given 02/25/14 2337)  acetaminophen (TYLENOL) tablet 650 mg (650 mg Oral Given 02/25/14 2336)    MDM   Final diagnoses:   Chest pain, rule out acute myocardial infarction  Hyperglycemia    Nursing notes including past medical history and social history reviewed and  considered in documentation xrays/imaging reviewed by myself and considered during evaluation Labs/vital reviewed myself and considered during evaluation Previous records reviewed and considered   I personally performed the services described in this documentation, which was scribed in my presence. The recorded information has been reviewed and is accurate.    Joya Gaskins, MD 02/26/14 [phone]

## 2014-02-26 ENCOUNTER — Encounter (HOSPITAL_COMMUNITY): Payer: Self-pay | Admitting: *Deleted

## 2014-02-26 DIAGNOSIS — Z6830 Body mass index (BMI) 30.0-30.9, adult: Secondary | ICD-10-CM | POA: Diagnosis not present

## 2014-02-26 DIAGNOSIS — M75102 Unspecified rotator cuff tear or rupture of left shoulder, not specified as traumatic: Secondary | ICD-10-CM | POA: Diagnosis present

## 2014-02-26 DIAGNOSIS — Z951 Presence of aortocoronary bypass graft: Secondary | ICD-10-CM | POA: Diagnosis not present

## 2014-02-26 DIAGNOSIS — J45909 Unspecified asthma, uncomplicated: Secondary | ICD-10-CM | POA: Diagnosis present

## 2014-02-26 DIAGNOSIS — E669 Obesity, unspecified: Secondary | ICD-10-CM | POA: Diagnosis present

## 2014-02-26 DIAGNOSIS — I5022 Chronic systolic (congestive) heart failure: Secondary | ICD-10-CM | POA: Diagnosis present

## 2014-02-26 DIAGNOSIS — R079 Chest pain, unspecified: Secondary | ICD-10-CM | POA: Diagnosis present

## 2014-02-26 DIAGNOSIS — Z888 Allergy status to other drugs, medicaments and biological substances status: Secondary | ICD-10-CM | POA: Diagnosis not present

## 2014-02-26 DIAGNOSIS — J628 Pneumoconiosis due to other dust containing silica: Secondary | ICD-10-CM | POA: Diagnosis present

## 2014-02-26 DIAGNOSIS — I951 Orthostatic hypotension: Secondary | ICD-10-CM | POA: Diagnosis present

## 2014-02-26 DIAGNOSIS — E785 Hyperlipidemia, unspecified: Secondary | ICD-10-CM | POA: Diagnosis present

## 2014-02-26 DIAGNOSIS — Z955 Presence of coronary angioplasty implant and graft: Secondary | ICD-10-CM | POA: Diagnosis not present

## 2014-02-26 DIAGNOSIS — I739 Peripheral vascular disease, unspecified: Secondary | ICD-10-CM | POA: Diagnosis present

## 2014-02-26 DIAGNOSIS — I2510 Atherosclerotic heart disease of native coronary artery without angina pectoris: Secondary | ICD-10-CM | POA: Diagnosis present

## 2014-02-26 DIAGNOSIS — I255 Ischemic cardiomyopathy: Secondary | ICD-10-CM | POA: Diagnosis present

## 2014-02-26 DIAGNOSIS — Z66 Do not resuscitate: Secondary | ICD-10-CM | POA: Diagnosis present

## 2014-02-26 DIAGNOSIS — J9610 Chronic respiratory failure, unspecified whether with hypoxia or hypercapnia: Secondary | ICD-10-CM | POA: Diagnosis present

## 2014-02-26 DIAGNOSIS — Z7982 Long term (current) use of aspirin: Secondary | ICD-10-CM | POA: Diagnosis not present

## 2014-02-26 DIAGNOSIS — E1165 Type 2 diabetes mellitus with hyperglycemia: Secondary | ICD-10-CM | POA: Diagnosis present

## 2014-02-26 DIAGNOSIS — J449 Chronic obstructive pulmonary disease, unspecified: Secondary | ICD-10-CM | POA: Diagnosis present

## 2014-02-26 DIAGNOSIS — Z8673 Personal history of transient ischemic attack (TIA), and cerebral infarction without residual deficits: Secondary | ICD-10-CM | POA: Diagnosis not present

## 2014-02-26 DIAGNOSIS — Z9981 Dependence on supplemental oxygen: Secondary | ICD-10-CM | POA: Diagnosis not present

## 2014-02-26 DIAGNOSIS — I10 Essential (primary) hypertension: Secondary | ICD-10-CM | POA: Diagnosis present

## 2014-02-26 LAB — GLUCOSE, CAPILLARY
GLUCOSE-CAPILLARY: 348 mg/dL — AB (ref 70–99)
GLUCOSE-CAPILLARY: 359 mg/dL — AB (ref 70–99)
Glucose-Capillary: 280 mg/dL — ABNORMAL HIGH (ref 70–99)
Glucose-Capillary: 293 mg/dL — ABNORMAL HIGH (ref 70–99)
Glucose-Capillary: 413 mg/dL — ABNORMAL HIGH (ref 70–99)

## 2014-02-26 LAB — CREATININE, SERUM
Creatinine, Ser: 0.99 mg/dL (ref 0.50–1.35)
GFR calc Af Amer: >90 mL/min (ref >90)
GFR calc non Af Amer: 84 mL/min — ABNORMAL LOW (ref >90)

## 2014-02-26 LAB — CBC
HCT: 40.5 % (ref 39.0–52.0)
HEMATOCRIT: 36.4 % — AB (ref 39.0–52.0)
HEMOGLOBIN: 12.7 g/dL — AB (ref 13.0–17.0)
Hemoglobin: 14.1 g/dL (ref 13.0–17.0)
MCH: 30.2 pg (ref 26.0–34.0)
MCH: 30.3 pg (ref 26.0–34.0)
MCHC: 34.9 g/dL (ref 30.0–36.0)
MCHC: 34.8 g/dL (ref 30.0–36.0)
MCV: 86.7 fL (ref 78.0–100.0)
MCV: 86.9 fL (ref 78.0–100.0)
Platelets: 188 10*3/uL (ref 150–400)
Platelets: 208 10*3/uL (ref 150–400)
RBC: 4.2 MIL/uL — ABNORMAL LOW (ref 4.22–5.81)
RBC: 4.66 MIL/uL (ref 4.22–5.81)
RDW: 13.6 % (ref 11.5–15.5)
RDW: 13.8 % (ref 11.5–15.5)
WBC: 6.3 10*3/uL (ref 4.0–10.5)
WBC: 7.5 10*3/uL (ref 4.0–10.5)

## 2014-02-26 LAB — COMPREHENSIVE METABOLIC PANEL
ALT: 11 U/L (ref 0–53)
AST: 10 U/L (ref 0–37)
Albumin: 3.8 g/dL (ref 3.5–5.2)
Alkaline Phosphatase: 77 U/L (ref 39–117)
Anion gap: 14 (ref 5–15)
BILIRUBIN TOTAL: 0.5 mg/dL (ref 0.3–1.2)
BUN: 15 mg/dL (ref 6–23)
CALCIUM: 9.6 mg/dL (ref 8.4–10.5)
CHLORIDE: 95 meq/L — AB (ref 96–112)
CO2: 28 meq/L (ref 19–32)
CREATININE: 0.97 mg/dL (ref 0.50–1.35)
GFR calc non Af Amer: 85 mL/min — ABNORMAL LOW (ref >90)
Glucose, Bld: 313 mg/dL — ABNORMAL HIGH (ref 70–99)
Potassium: 3.9 mEq/L (ref 3.7–5.3)
Sodium: 137 mEq/L (ref 137–147)
Total Protein: 7 g/dL (ref 6.0–8.3)

## 2014-02-26 LAB — TROPONIN I
Troponin I: <0.3 ng/mL (ref <0.30)
Troponin I: <0.3 ng/mL (ref <0.30)

## 2014-02-26 MED ORDER — DIGOXIN 125 MCG PO TABS
0.2500 mg | ORAL_TABLET | Freq: Every day | ORAL | Status: DC
  Administered 2014-02-26 – 2014-03-01 (×4): 0.25 mg via ORAL
  Filled 2014-02-26 (×4): qty 2

## 2014-02-26 MED ORDER — SODIUM CHLORIDE 0.9 % IJ SOLN
3.0000 mL | Freq: Two times a day (BID) | INTRAMUSCULAR | Status: DC
  Administered 2014-02-26 – 2014-03-01 (×7): 3 mL via INTRAVENOUS

## 2014-02-26 MED ORDER — INSULIN NPH (HUMAN) (ISOPHANE) 100 UNIT/ML ~~LOC~~ SUSP
110.0000 [IU] | Freq: Two times a day (BID) | SUBCUTANEOUS | Status: DC
  Administered 2014-02-26 – 2014-02-27 (×3): 110 [IU] via SUBCUTANEOUS
  Filled 2014-02-26 (×2): qty 10

## 2014-02-26 MED ORDER — SODIUM CHLORIDE 0.9 % IV SOLN: 250.0000 mL | INTRAVENOUS | Status: DC | PRN

## 2014-02-26 MED ORDER — ASPIRIN EC 81 MG PO TBEC
81.0000 mg | DELAYED_RELEASE_TABLET | Freq: Every day | ORAL | Status: DC
  Administered 2014-02-26 – 2014-03-01 (×4): 81 mg via ORAL
  Filled 2014-02-26 (×4): qty 1

## 2014-02-26 MED ORDER — ISOSORBIDE MONONITRATE ER 60 MG PO TB24
30.0000 mg | ORAL_TABLET | Freq: Every day | ORAL | Status: DC
  Administered 2014-02-26 – 2014-02-27 (×2): 30 mg via ORAL
  Filled 2014-02-26 (×2): qty 1

## 2014-02-26 MED ORDER — HYDROMORPHONE HCL 1 MG/ML IJ SOLN
1.0000 mg | INTRAMUSCULAR | Status: DC | PRN
  Administered 2014-03-01: 1 mg via INTRAVENOUS
  Filled 2014-02-26: qty 1

## 2014-02-26 MED ORDER — ENOXAPARIN SODIUM 40 MG/0.4ML ~~LOC~~ SOLN
40.0000 mg | SUBCUTANEOUS | Status: DC
  Administered 2014-02-26 – 2014-03-01 (×4): 40 mg via SUBCUTANEOUS
  Filled 2014-02-26 (×4): qty 0.4

## 2014-02-26 MED ORDER — OXYCODONE-ACETAMINOPHEN 5-325 MG PO TABS
1.0000 | ORAL_TABLET | Freq: Once | ORAL | Status: AC
  Administered 2014-02-26: 1 via ORAL
  Filled 2014-02-26: qty 1

## 2014-02-26 MED ORDER — CARVEDILOL 3.125 MG PO TABS
6.2500 mg | ORAL_TABLET | Freq: Two times a day (BID) | ORAL | Status: DC
  Administered 2014-02-26 – 2014-03-01 (×7): 6.25 mg via ORAL
  Filled 2014-02-26 (×7): qty 2

## 2014-02-26 MED ORDER — INSULIN NPH (HUMAN) (ISOPHANE) 100 UNIT/ML ~~LOC~~ SUSP
SUBCUTANEOUS | Status: AC
  Filled 2014-02-26: qty 10

## 2014-02-26 MED ORDER — ONDANSETRON HCL 4 MG/2ML IJ SOLN: 4.0000 mg | Freq: Four times a day (QID) | INTRAMUSCULAR | Status: DC | PRN

## 2014-02-26 MED ORDER — CETYLPYRIDINIUM CHLORIDE 0.05 % MT LIQD
7.0000 mL | Freq: Two times a day (BID) | OROMUCOSAL | Status: DC
  Administered 2014-02-26 – 2014-03-01 (×7): 7 mL via OROMUCOSAL

## 2014-02-26 MED ORDER — SODIUM CHLORIDE 0.9 % IJ SOLN: 3.0000 mL | INTRAMUSCULAR | Status: DC | PRN

## 2014-02-26 MED ORDER — NITROGLYCERIN 0.4 MG SL SUBL
0.4000 mg | SUBLINGUAL_TABLET | SUBLINGUAL | Status: DC | PRN
  Administered 2014-02-26: 0.4 mg via SUBLINGUAL

## 2014-02-26 MED ORDER — INSULIN ASPART 100 UNIT/ML ~~LOC~~ SOLN
0.0000 [IU] | Freq: Three times a day (TID) | SUBCUTANEOUS | Status: DC
  Administered 2014-02-26: 7 [IU] via SUBCUTANEOUS
  Administered 2014-02-26: 5 [IU] via SUBCUTANEOUS
  Administered 2014-02-26: 9 [IU] via SUBCUTANEOUS
  Administered 2014-02-27 – 2014-02-28 (×2): 1 [IU] via SUBCUTANEOUS

## 2014-02-26 MED ORDER — ONDANSETRON HCL 4 MG PO TABS: 4.0000 mg | ORAL_TABLET | Freq: Four times a day (QID) | ORAL | Status: DC | PRN

## 2014-02-26 MED ORDER — FUROSEMIDE 80 MG PO TABS
80.0000 mg | ORAL_TABLET | Freq: Two times a day (BID) | ORAL | Status: DC
  Administered 2014-02-26 – 2014-02-28 (×6): 80 mg via ORAL
  Filled 2014-02-26 (×4): qty 1
  Filled 2014-02-26: qty 2

## 2014-02-26 MED ORDER — LISINOPRIL 5 MG PO TABS
2.5000 mg | ORAL_TABLET | Freq: Every day | ORAL | Status: DC
  Administered 2014-02-26 – 2014-03-01 (×4): 2.5 mg via ORAL
  Filled 2014-02-26 (×4): qty 1

## 2014-02-26 MED ORDER — HYDROCODONE-ACETAMINOPHEN 10-325 MG PO TABS
1.0000 | ORAL_TABLET | ORAL | Status: DC | PRN
  Administered 2014-02-26 – 2014-03-01 (×9): 1 via ORAL
  Filled 2014-02-26 (×9): qty 1

## 2014-02-26 MED ORDER — ATORVASTATIN CALCIUM 40 MG PO TABS
80.0000 mg | ORAL_TABLET | Freq: Every day | ORAL | Status: DC
  Administered 2014-02-26 – 2014-03-01 (×4): 80 mg via ORAL
  Filled 2014-02-26 (×4): qty 2

## 2014-02-26 MED ORDER — POTASSIUM CHLORIDE CRYS ER 20 MEQ PO TBCR
20.0000 meq | EXTENDED_RELEASE_TABLET | Freq: Every day | ORAL | Status: DC
  Administered 2014-02-26 – 2014-03-01 (×4): 20 meq via ORAL
  Filled 2014-02-26 (×4): qty 1

## 2014-02-26 MED ORDER — CYCLOBENZAPRINE HCL 10 MG PO TABS
5.0000 mg | ORAL_TABLET | Freq: Three times a day (TID) | ORAL | Status: DC | PRN
  Administered 2014-02-26 – 2014-02-28 (×5): 5 mg via ORAL
  Filled 2014-02-26 (×5): qty 1

## 2014-02-26 NOTE — Progress Notes (Signed)
Subjective: When I was in his room he told me he was feeling better and had no further chest pain. However about 10 minutes later he developed increasing problems with pain in his chest that goes up into his neck. He was given nitroglycerin. He is already on oxygen. He is on chest pain protocol. His nitroglycerin relieved his pain. When he had a similar episode several months ago he was felt not to be a candidate for any sort of invasive testing or treatment  Objective: Vital signs in last 24 hours: Temp:  [97.7 F (36.5 C)-98 F (36.7 C)] 97.7 F (36.5 C) (11/21 0523) Pulse Rate:  [44-101] 100 (11/21 1141) Resp:  [15-22] 18 (11/21 1141) BP: (115-157)/(43-88) 132/71 mmHg (11/21 1141) SpO2:  [98 %-100 %] 99 % (11/21 1141) Weight:  [109.135 kg (240 lb 9.6 oz)] 109.135 kg (240 lb 9.6 oz) (11/21 0233) Weight change:  Last BM Date: 02/25/14  Intake/Output from previous day:    PHYSICAL EXAM General appearance: alert, cooperative and mild distress Resp: clear to auscultation bilaterally Cardio: regular rate and rhythm, S1, S2 normal, no murmur, click, rub or gallop GI: soft, non-tender; bowel sounds normal; no masses,  no organomegaly Extremities: extremities normal, atraumatic, no cyanosis or edema  Lab Results:  Results for orders placed or performed during the hospital encounter of 02/25/14 (from the past 48 hour(s))  Troponin I     Status: None   Collection Time: 02/25/14 10:00 PM  Result Value Ref Range   Troponin I <0.30 <0.30 ng/mL    Comment:        Due to the release kinetics of cTnI, a negative result within the first hours of the onset of symptoms does not rule out myocardial infarction with certainty. If myocardial infarction is still suspected, repeat the test at appropriate intervals.   CBC with Differential     Status: Abnormal   Collection Time: 02/25/14 10:00 PM  Result Value Ref Range   WBC 8.3 4.0 - 10.5 K/uL   RBC 4.43 4.22 - 5.81 MIL/uL   Hemoglobin 13.5  13.0 - 17.0 g/dL   HCT 38.5 (L) 39.0 - 52.0 %   MCV 86.9 78.0 - 100.0 fL   MCH 30.5 26.0 - 34.0 pg   MCHC 35.1 30.0 - 36.0 g/dL   RDW 13.6 11.5 - 15.5 %   Platelets 230 150 - 400 K/uL   Neutrophils Relative % 79 (H) 43 - 77 %   Neutro Abs 6.4 1.7 - 7.7 K/uL   Lymphocytes Relative 14 12 - 46 %   Lymphs Abs 1.2 0.7 - 4.0 K/uL   Monocytes Relative 6 3 - 12 %   Monocytes Absolute 0.5 0.1 - 1.0 K/uL   Eosinophils Relative 1 0 - 5 %   Eosinophils Absolute 0.1 0.0 - 0.7 K/uL   Basophils Relative 0 0 - 1 %   Basophils Absolute 0.0 0.0 - 0.1 K/uL  Pro b natriuretic peptide     Status: Abnormal   Collection Time: 02/25/14 10:00 PM  Result Value Ref Range   Pro B Natriuretic peptide (BNP) 1014.0 (H) 0 - 125 pg/mL  Comprehensive metabolic panel     Status: Abnormal   Collection Time: 02/25/14 10:00 PM  Result Value Ref Range   Sodium 135 (L) 137 - 147 mEq/L   Potassium 4.3 3.7 - 5.3 mEq/L   Chloride 93 (L) 96 - 112 mEq/L   CO2 29 19 - 32 mEq/L   Glucose, Bld 418 (H)  70 - 99 mg/dL   BUN 18 6 - 23 mg/dL   Creatinine, Ser 0.97 0.50 - 1.35 mg/dL   Calcium 9.7 8.4 - 10.5 mg/dL   Total Protein 6.8 6.0 - 8.3 g/dL   Albumin 3.6 3.5 - 5.2 g/dL   AST 11 0 - 37 U/L   ALT 11 0 - 53 U/L   Alkaline Phosphatase 90 39 - 117 U/L   Total Bilirubin 0.3 0.3 - 1.2 mg/dL   GFR calc non Af Amer 85 (L) >90 mL/min   GFR calc Af Amer >90 >90 mL/min    Comment: (NOTE) The eGFR has been calculated using the CKD EPI equation. This calculation has not been validated in all clinical situations. eGFR's persistently <90 mL/min signify possible Chronic Kidney Disease.    Anion gap 13 5 - 15  Digoxin level     Status: Abnormal   Collection Time: 02/25/14 11:31 PM  Result Value Ref Range   Digoxin Level <0.3 (L) 0.8 - 2.0 ng/mL  CBC     Status: Abnormal   Collection Time: 02/26/14  3:12 AM  Result Value Ref Range   WBC 6.3 4.0 - 10.5 K/uL   RBC 4.20 (L) 4.22 - 5.81 MIL/uL   Hemoglobin 12.7 (L) 13.0 - 17.0  g/dL   HCT 36.4 (L) 39.0 - 52.0 %   MCV 86.7 78.0 - 100.0 fL   MCH 30.2 26.0 - 34.0 pg   MCHC 34.9 30.0 - 36.0 g/dL   RDW 13.6 11.5 - 15.5 %   Platelets 188 150 - 400 K/uL  Creatinine, serum     Status: Abnormal   Collection Time: 02/26/14  3:12 AM  Result Value Ref Range   Creatinine, Ser 0.99 0.50 - 1.35 mg/dL   GFR calc non Af Amer 84 (L) >90 mL/min   GFR calc Af Amer >90 >90 mL/min    Comment: (NOTE) The eGFR has been calculated using the CKD EPI equation. This calculation has not been validated in all clinical situations. eGFR's persistently <90 mL/min signify possible Chronic Kidney Disease.   Troponin I     Status: None   Collection Time: 02/26/14  3:12 AM  Result Value Ref Range   Troponin I <0.30 <0.30 ng/mL    Comment:        Due to the release kinetics of cTnI, a negative result within the first hours of the onset of symptoms does not rule out myocardial infarction with certainty. If myocardial infarction is still suspected, repeat the test at appropriate intervals.   Glucose, capillary     Status: Abnormal   Collection Time: 02/26/14  3:14 AM  Result Value Ref Range   Glucose-Capillary 280 (H) 70 - 99 mg/dL   Comment 1 Notify RN   CBC     Status: None   Collection Time: 02/26/14  8:43 AM  Result Value Ref Range   WBC 7.5 4.0 - 10.5 K/uL   RBC 4.66 4.22 - 5.81 MIL/uL   Hemoglobin 14.1 13.0 - 17.0 g/dL   HCT 40.5 39.0 - 52.0 %   MCV 86.9 78.0 - 100.0 fL   MCH 30.3 26.0 - 34.0 pg   MCHC 34.8 30.0 - 36.0 g/dL   RDW 13.8 11.5 - 15.5 %   Platelets 208 150 - 400 K/uL  Comprehensive metabolic panel     Status: Abnormal   Collection Time: 02/26/14  8:43 AM  Result Value Ref Range   Sodium 137 137 - 147 mEq/L  Potassium 3.9 3.7 - 5.3 mEq/L   Chloride 95 (L) 96 - 112 mEq/L   CO2 28 19 - 32 mEq/L   Glucose, Bld 313 (H) 70 - 99 mg/dL   BUN 15 6 - 23 mg/dL   Creatinine, Ser 0.97 0.50 - 1.35 mg/dL   Calcium 9.6 8.4 - 10.5 mg/dL   Total Protein 7.0 6.0 - 8.3  g/dL   Albumin 3.8 3.5 - 5.2 g/dL   AST 10 0 - 37 U/L   ALT 11 0 - 53 U/L   Alkaline Phosphatase 77 39 - 117 U/L   Total Bilirubin 0.5 0.3 - 1.2 mg/dL   GFR calc non Af Amer 85 (L) >90 mL/min   GFR calc Af Amer >90 >90 mL/min    Comment: (NOTE) The eGFR has been calculated using the CKD EPI equation. This calculation has not been validated in all clinical situations. eGFR's persistently <90 mL/min signify possible Chronic Kidney Disease.    Anion gap 14 5 - 15  Troponin I     Status: None   Collection Time: 02/26/14  8:43 AM  Result Value Ref Range   Troponin I <0.30 <0.30 ng/mL    Comment:        Due to the release kinetics of cTnI, a negative result within the first hours of the onset of symptoms does not rule out myocardial infarction with certainty. If myocardial infarction is still suspected, repeat the test at appropriate intervals.     ABGS No results for input(s): PHART, PO2ART, TCO2, HCO3 in the last 72 hours.  Invalid input(s): PCO2 CULTURES No results found for this or any previous visit (from the past 240 hour(s)). Studies/Results: Dg Chest Portable 1 View  02/25/2014   CLINICAL DATA:  Mid to left-sided chest pain, radiating to the left shoulder, neck, and jaw. Shortness of breath today.  EXAM: PORTABLE CHEST - 1 VIEW  COMPARISON:  12/14/2013  FINDINGS: Postoperative changes in the mediastinum. Shallow inspiration. Cardiac enlargement. Pulmonary vascularity appears normal. Diffuse interstitial pattern to the lungs consistent with chronic infiltration or fibrosis and unchanged since prior studies. No focal consolidation. No blunting of costophrenic angles. No pneumothorax. Calcification of the aorta.  IMPRESSION: Cardiac enlargement. Unchanged appearance of chronic interstitial lung disease.   Electronically Signed   By: Lucienne Capers M.D.   On: 02/25/2014 22:55    Medications:  Prior to Admission:  Prescriptions prior to admission  Medication Sig Dispense  Refill Last Dose  . Aspirin-Salicylamide-Caffeine (BC FAST PAIN RELIEF ARTHRITIS) [phone] MG PACK Take 1 packet by mouth 3 (three) times daily as needed (for pain).   02/24/2014  . atorvastatin (LIPITOR) 80 MG tablet Take 1 tablet (80 mg total) by mouth daily. 90 tablet 3 02/24/2014  . carvedilol (COREG) 6.25 MG tablet Take 1 tablet (6.25 mg total) by mouth 2 (two) times daily. 180 tablet 3 02/24/2014 at 2000  . digoxin (LANOXIN) 0.25 MG tablet Take 1 tablet (0.25 mg total) by mouth daily. 30 tablet 12 02/24/2014  . furosemide (LASIX) 80 MG tablet Take 80 mg by mouth 2 (two) times daily.   02/24/2014  . HYDROcodone-acetaminophen (NORCO) 7.5-325 MG per tablet Take 1 tablet by mouth every 6 (six) hours as needed for moderate pain (pain in leg and shoulder).   UNKNOWN  . insulin NPH (HUMULIN N,NOVOLIN N) 100 UNIT/ML injection Inject 110 Units into the skin 2 (two) times daily.   02/24/2014  . isosorbide mononitrate (IMDUR) 30 MG 24 hr tablet Take 1  tablet (30 mg total) by mouth daily. 90 tablet 3 02/24/2014  . KLOR-CON M20 20 MEQ tablet Take 20 mEq by mouth daily.    02/24/2014  . nitroGLYCERIN (NITROSTAT) 0.4 MG SL tablet Place 0.4 mg under the tongue every 5 (five) minutes as needed for chest pain.    02/25/2014 at Unknown time  . oxyCODONE-acetaminophen (PERCOCET/ROXICET) 5-325 MG per tablet Take 1 tablet by mouth 4 (four) times daily as needed for moderate pain or severe pain (for knee and shoulder).    UNKNOWN  . vitamin B-12 (CYANOCOBALAMIN) 1000 MCG tablet Take 1,000 mcg by mouth daily.   02/24/2014  . vitamin C (ASCORBIC ACID) 500 MG tablet Take 1,000 mg by mouth at bedtime.   02/24/2014  . vitamin E 400 UNIT capsule Take 400 Units by mouth at bedtime.    02/24/2014   Scheduled: . antiseptic oral rinse  7 mL Mouth Rinse BID  . aspirin EC  81 mg Oral Daily  . atorvastatin  80 mg Oral Daily  . carvedilol  6.25 mg Oral BID WC  . digoxin  0.25 mg Oral Daily  . enoxaparin (LOVENOX) injection   40 mg Subcutaneous Q24H  . furosemide  80 mg Oral BID  . insulin aspart  0-9 Units Subcutaneous TID WC  . insulin NPH Human  110 Units Subcutaneous BID AC & HS  . isosorbide mononitrate  30 mg Oral Daily  . lisinopril  2.5 mg Oral Daily  . potassium chloride SA  20 mEq Oral Daily  . sodium chloride  3 mL Intravenous Q12H   Continuous:  CBU:LAGTXM chloride, cyclobenzaprine, HYDROcodone-acetaminophen, HYDROmorphone (DILAUDID) injection, nitroGLYCERIN, ondansetron **OR** ondansetron (ZOFRAN) IV, sodium chloride  Assesment: He was admitted with chest pain and is known to have an ischemic cardiomyopathy. He had improved but now is having some more chest pain. I don't think there is really anything to add. He is not felt to be a candidate for invasive testing or treatment. Active Problems:   Diabetes mellitus   Hyperlipidemia   Ischemic cardiomyopathy   COPD, severe  O2 dependent   Rotator cuff syndrome of left shoulder   Chest pain   DNR no code (do not resuscitate)   Chest pain, rule out acute myocardial infarction    Plan: Continue chest pain protocol. He will have more troponins.    LOS: 1 day   Onnie Hatchel L 02/26/2014, 12:00 PM

## 2014-02-26 NOTE — Progress Notes (Signed)
02/26/14 1218 Patient denies chest pain. Notified Dr Juanetta GoslingHawkins of EKG results. Troponins ordered per MD. Nursing staff to continue monitoring. Earnstine RegalAshley Andreah Goheen, RN

## 2014-02-26 NOTE — Progress Notes (Signed)
Nutrition Brief Note  Patient identified on the Malnutrition Screening Tool (MST) Report  Wt Readings from Last 15 Encounters:  02/26/14 240 lb 9.6 oz (109.135 kg)  12/14/13 262 lb (118.842 kg)  09/10/13 252 lb (114.306 kg)  08/10/13 260 lb (117.935 kg)  08/01/13 265 lb 10.5 oz (120.5 kg)  07/05/13 254 lb (115.214 kg)  05/03/13 261 lb (118.389 kg)  04/29/13 266 lb (120.657 kg)  04/19/13 275 lb (124.739 kg)  02/24/13 276 lb (125.193 kg)  01/26/13 281 lb (127.461 kg)  01/18/13 276 lb 7.3 oz (125.4 kg)  09/04/11 318 lb (144.244 kg)  08/06/11 315 lb (142.883 kg)  07/19/11 327 lb (148.41326 kg)   63 year old male who  has a past medical history of Uncontrolled diabetes mellitus; Chronic systolic heart failure; Obesity; Hyperlipidemia; Peripheral vascular disease; Hypertension; Asthma; CAD (coronary artery disease); Ischemic cardiomyopathy; Stroke; Arthritis; Anxiety; Depression; COPD, severe O2 dependent (01/16/2013); Chronic respiratory failure; Osteomyelitis (2013); and DNR no code (do not resuscitate) (07/2013). Today presents to the hospital with chief complaint of chest pain started tonight.  Chart review indicates that pt has had some recent wt gain and diuretics were recently increased. Suspect noncompliance with treatment due to poor social situation.   Body mass index is 30.07 kg/(m^2). Patient meets criteria for obesity, class I based on current BMI.   Current diet order is Heart Healthy, patient is consuming approximately 50% of meals at this time. Labs and medications reviewed.   No nutrition interventions warranted at this time. If nutrition issues arise, please consult RD.   Taneisha Fuson A. Mayford KnifeWilliams, RD, LDN Pager: [phone]

## 2014-02-26 NOTE — ED Notes (Signed)
MD at bedside. 

## 2014-02-26 NOTE — Progress Notes (Signed)
Pt c/o chest pain to left chest. VSS. Nitro 0.4mg  given SL. Pt verbalized relief after Nitro.

## 2014-02-26 NOTE — ED Notes (Signed)
Hospitalist at bedside 

## 2014-02-26 NOTE — Progress Notes (Signed)
Utilization Review completed.  

## 2014-02-26 NOTE — H&P (Signed)
PCP:   Fredirick Maudlin, MD   Chief Complaint:  Chest pain  HPI: 63 year old male who   has a past medical history of Uncontrolled diabetes mellitus; Chronic systolic heart failure; Obesity; Hyperlipidemia; Peripheral vascular disease; Hypertension; Asthma; CAD (coronary artery disease); Ischemic cardiomyopathy; Stroke; Arthritis; Anxiety; Depression; COPD, severe  O2 dependent (01/16/2013); Chronic respiratory failure; Osteomyelitis (2013); and DNR no code (do not resuscitate) (07/2013). Today presents to the hospital with chief complaint of chest pain started tonight. As per patient he has been having chest pain for past few days but today became worse and he took nitroglycerin tablets which brought him some relief. At this time pain has improved and he only feels pressure. He denies shortness of breath more than usual, patient is on home oxygen for COPD. He denies nausea vomiting or diarrhea. No fever dysuria urgency or frequency of urination. Patient has history of ischemic cardiac myopathy and status post CABG and coronary stents. Patient currently is not taking Plavix. He takes aspirin 81 mg by mouth daily  Allergies:   Allergies  Allergen Reactions  . Baycol [Cerivastatin]   . Stadol [Butorphanol Tartrate] Other (See Comments)    "makes him crazy"      Past Medical History  Diagnosis Date  . Uncontrolled diabetes mellitus     a. A1C 12.8 in 01/2013.  [name] Chronic systolic heart failure   . Obesity   . Hyperlipidemia   . Peripheral vascular disease   . Hypertension   . Asthma   . CAD (coronary artery disease)     a. Prior CABG 20 yrs ago (?~1994). b. Hx of multiple stents, prev followed in [place]. c. [place] 01/2013: cath moderate disease in LAD, distal LCx, prox RCA occluded, 2 SVGs occluded, felt to be stable from prior cath -> for medical therapy.  . Ischemic cardiomyopathy     a. EF 2012: 55-60. b. EF 12/2011: 25-30%. c. Echo 01/2013: EF 15-20%.  . Stroke     15 years ago    . Arthritis   . Anxiety   . Depression   . 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 Chronic respiratory failure     a. Due to COPD.  . Osteomyelitis 2013    was planned for chronic suppressive antibiotics but cannot afford this any longer  . DNR no code (do not resuscitate) 07/2013    Past Surgical History  Procedure Laterality Date  . Coronary artery bypass graft    . Coronary stent placement  2008    CABG grafts closed  . Joint replacement    . Cholecystectomy    . Eye surgery    . I&d extremity  06/12/2011    Procedure: IRRIGATION AND DEBRIDEMENT EXTREMITY;  Surgeon: Javier Docker, MD;  Location: [facility] OR;  Service: Orthopedics;  Laterality: Right;  I&D Right [place]    Prior to Admission medications   Medication Sig Start Date End Date Taking? Authorizing Provider  aspirin EC 81 MG tablet Take 81 mg by mouth daily.   Yes Historical Provider, MD  nitroGLYCERIN (NITROSTAT) 0.4 MG SL tablet Place 0.4 mg under the tongue every 5 (five) minutes as needed for chest pain.    Yes Historical Provider, MD  Aspirin-Salicylamide-Caffeine (BC FAST PAIN RELIEF ARTHRITIS) [phone] MG PACK Take 1 packet by mouth 3 (three) times daily as needed (for pain).    Historical Provider, MD  atorvastatin (LIPITOR) 80 MG tablet  Take 1 tablet (80 mg total) by mouth daily. 12/14/13   Antoine PocheJonathan F Branch, MD  carvedilol (COREG) 6.25 MG tablet Take 1 tablet (6.25 mg total) by mouth 2 (two) times daily. 08/10/13   Jodelle GrossKathryn M Lawrence, NP  clopidogrel (PLAVIX) 75 MG tablet Take 75 mg by mouth daily.  02/25/14   Historical Provider, MD  cyclobenzaprine (FLEXERIL) 5 MG tablet Take 5 mg by mouth 3 (three) times daily as needed for muscle spasms.    Historical Provider, MD  digoxin (LANOXIN) 0.25 MG tablet Take 1 tablet (0.25 mg total) by mouth daily. 04/22/13   Fredirick MaudlinEdward L Hawkins, MD  furosemide (LASIX) 80 MG tablet Take 80 mg by mouth 2 (two) times daily.    Historical  Provider, MD  HYDROcodone-acetaminophen (NORCO) 7.5-325 MG per tablet Take 1 tablet by mouth every 6 (six) hours as needed for moderate pain (pain in leg and shoulder).    Historical Provider, MD  insulin NPH (HUMULIN N,NOVOLIN N) 100 UNIT/ML injection Inject 110 Units into the skin 2 (two) times daily.    Historical Provider, MD  isosorbide dinitrate (ISORDIL) 20 MG tablet Take 20 mg by mouth 3 (three) times daily.  02/25/14   Historical Provider, MD  isosorbide mononitrate (IMDUR) 30 MG 24 hr tablet Take 1 tablet (30 mg total) by mouth daily. 12/14/13   Antoine PocheJonathan F Branch, MD  KLOR-CON M20 20 MEQ tablet Take 20 mEq by mouth daily.  01/07/13   Historical Provider, MD  lisinopril (PRINIVIL,ZESTRIL) 2.5 MG tablet TAKE ONE TABLET BY MOUTH ONCE DAILY 02/25/14   Antoine PocheJonathan F Branch, MD  Magnesium 250 MG TABS Take 250 tablets by mouth at bedtime.     Historical Provider, MD  Methylcobalamin (B-12) 5000 MCG TBDP Take 1 tablet by mouth at bedtime.    Historical Provider, MD  Nystatin Domestic POWD Apply powder to rash twice daily 07/05/13   Cliffton AstersJohn Campbell, MD  oxyCODONE-acetaminophen (PERCOCET/ROXICET) 5-325 MG per tablet Take 1 tablet by mouth 4 (four) times daily as needed for moderate pain or severe pain (for knee and shoulder).     Historical Provider, MD  sulfamethoxazole-trimethoprim (BACTRIM DS,SEPTRA DS) 800-160 MG per tablet Take 1 tablet by mouth 2 (two) times daily. Patient not taking: Reported on 02/25/2014 07/05/13   Cliffton AstersJohn Campbell, MD  vitamin C (ASCORBIC ACID) 500 MG tablet Take 1,000 mg by mouth at bedtime.    Historical Provider, MD  vitamin E 400 UNIT capsule Take 400 Units by mouth at bedtime.     Historical Provider, MD    Social History:  reports that he quit smoking about 30 years ago. He has never used smokeless tobacco. He reports that he does not drink alcohol or use illicit drugs.  Family History  Problem Relation Age of Onset  . Dementia Mother   . Arthritis Mother   . Heart disease  Father   . Heart disease Brother      All the positives are listed in BOLD  Review of Systems:  HEENT: Headache, blurred vision, runny nose, sore throat Neck: Hypothyroidism, hyperthyroidism,,lymphadenopathy Chest : Shortness of breath, history of COPD, Asthma Heart : Chest pain, history of coronary arterey disease GI:  Nausea, vomiting, diarrhea, constipation, GERD GU: Dysuria, urgency, frequency of urination, hematuria Neuro: Stroke, seizures, syncope Psych: Depression, anxiety, hallucinations   Physical Exam: Blood pressure 135/78, pulse 94, temperature 97.9 F (36.6 C), temperature source Oral, resp. rate 17, SpO2 100 %. Constitutional:   Patient is a well-developed and well-nourished *male in  no acute distress and cooperative with exam. Head: Normocephalic and atraumatic Mouth: Mucus membranes moist Eyes: PERRL, EOMI, conjunctivae normal Neck: Supple, No Thyromegaly Cardiovascular: RRR, S1 normal, S2 normal Pulmonary/Chest: Bibasilar crackles Abdominal: Soft. Non-tender, non-distended, bowel sounds are normal, no masses, organomegaly, or guarding present.  Neurological: A&O x3, Strength is normal and symmetric bilaterally, cranial nerve II-XII are grossly intact, no focal motor deficit, sensory intact to light touch bilaterally.  Extremities : No Cyanosis, Clubbing or Edema  Labs on Admission:  Basic Metabolic Panel:  Recent Labs Lab 02/25/14 2200  NA 135*  K 4.3  CL 93*  CO2 29  GLUCOSE 418*  BUN 18  CREATININE 0.97  CALCIUM 9.7   Liver Function Tests:  Recent Labs Lab 02/25/14 2200  AST 11  ALT 11  ALKPHOS 90  BILITOT 0.3  PROT 6.8  ALBUMIN 3.6   No results for input(s): LIPASE, AMYLASE in the last 168 hours. No results for input(s): AMMONIA in the last 168 hours. CBC:  Recent Labs Lab 02/25/14 2200  WBC 8.3  NEUTROABS 6.4  HGB 13.5  HCT 38.5*  MCV 86.9  PLT 230   Cardiac Enzymes:  Recent Labs Lab 02/25/14 2200  TROPONINI <0.30     BNP (last 3 results)  Recent Labs  04/19/13 2033 07/30/13 1523 02/25/14 2200  PROBNP 1316.0* 604.2* 1014.0*   CBG: No results for input(s): GLUCAP in the last 168 hours.  Radiological Exams on Admission: Dg Chest Portable 1 View  02/25/2014   CLINICAL DATA:  Mid to left-sided chest pain, radiating to the left shoulder, neck, and jaw. Shortness of breath today.  EXAM: PORTABLE CHEST - 1 VIEW  COMPARISON:  12/14/2013  FINDINGS: Postoperative changes in the mediastinum. Shallow inspiration. Cardiac enlargement. Pulmonary vascularity appears normal. Diffuse interstitial pattern to the lungs consistent with chronic infiltration or fibrosis and unchanged since prior studies. No focal consolidation. No blunting of costophrenic angles. No pneumothorax. Calcification of the aorta.  IMPRESSION: Cardiac enlargement. Unchanged appearance of chronic interstitial lung disease.   Electronically Signed   By: Burman Nieves M.D.   On: 02/25/2014 22:55    EKG: Independently reviewed. T-wave inversion in lead 1 aVL , V5 and V6   Assessment/Plan Active Problems:   Diabetes mellitus   Hyperlipidemia   Ischemic cardiomyopathy   COPD, severe  O2 dependent   Rotator cuff syndrome of left shoulder   Chest pain   DNR no code (do not resuscitate)   Chest pain, rule out acute myocardial infarction  Chest pain Patient has history of ischemic cardiopathy and is currently taking Imdur, aspirin, Coreg at home. Will continue the home regimen. We'll cycle the cardiac enzymes. EKG shows T-wave inversions, patient is not a candidate for aggressive intervention. Will obtain cardiac consultation in a.m.  Severe COPD Patient is O2 dependent, will include oxygen. DuoNeb nebulizers every 6 hours when necessary. Currently patient is not in exacerbation  Diabetes mellitus Continue sliding scale insulin along with NPH 110 units twice a day  Ischemic cardiomyopathy Continue Coreg 6.25 twice a day, aspirin 81 mg  daily, lisinopril 2.5 mg by mouth daily.  Chronic shoulder pain Patient has left chronic shoulder pain due to rotator cuff syndrome. We'll start Dilaudid 1 mg every 4 hours when necessary for pain  DVT prophylaxis Lovenox  Code status: Patient is DO NOT RESUSCITATE  Family discussion: Admission, patients condition and plan of care including tests being ordered have been discussed with the patient and her sons at bedside* who  indicate understanding and agree with the plan and Code Status.   Time Spent on Admission: 65 minutes  LAMA,GAGAN S [facility] Pager: [phone] 02/26/2014, 1:19 AM  If 7PM-7AM, please contact night-coverage  [email or web address]  Password [email or web address]

## 2014-02-26 NOTE — Progress Notes (Signed)
Patient c/o chest pain. One nitro given. Pain relieved. Will continue to monitor.

## 2014-02-26 NOTE — Progress Notes (Signed)
02/26/14 1151 Patient c/o "chest tightness radiates up to my neck" at 1144. Nitro 0.4 mg tablet SL given per chest pain protocol. VSS, see flowsheet. Denies shortness of breath. O2 at 3 lpm in place. Notified Dr Juanetta GoslingHawkins, stated okay to continue nitro PRN chest pain and get stat EKG per chest pain protocol. Pt denies chest pain after 1 nitro tablet. Will continue to monitor. Earnstine RegalAshley Enslie Sahota, RN

## 2014-02-27 LAB — GLUCOSE, CAPILLARY
GLUCOSE-CAPILLARY: 108 mg/dL — AB (ref 70–99)
GLUCOSE-CAPILLARY: 85 mg/dL (ref 70–99)
Glucose-Capillary: 126 mg/dL — ABNORMAL HIGH (ref 70–99)
Glucose-Capillary: 154 mg/dL — ABNORMAL HIGH (ref 70–99)

## 2014-02-27 LAB — HEMOGLOBIN A1C
Hgb A1c MFr Bld: 12.3 % — ABNORMAL HIGH (ref <5.7)
MEAN PLASMA GLUCOSE: 306 mg/dL — AB (ref <117)

## 2014-02-27 LAB — TROPONIN I

## 2014-02-27 MED ORDER — SODIUM CHLORIDE 0.9 % IV BOLUS (SEPSIS)
250.0000 mL | Freq: Once | INTRAVENOUS | Status: AC
  Administered 2014-02-27: 250 mL via INTRAVENOUS

## 2014-02-27 NOTE — Progress Notes (Signed)
Pt called out c/o "not feeling right and dizzy."  RN checked VS, BP 80/51 automatic, 90/60 manual.  Dr. Ouida SillsFagan notified.  Received order for 250cc bolus.  Pt's BP after bolus 107/58.  Pt states he feels better.  Will continue to monitor patient.

## 2014-02-27 NOTE — Progress Notes (Signed)
Subjective: He feels better. He has no chest pain now. He wants to try to get up. He does have some shoulder and neck pain  Objective: Vital signs in last 24 hours: Temp:  [98.1 F (36.7 C)-98.4 F (36.9 C)] 98.4 F (36.9 C) (11/22 0614) Pulse Rate:  [74-103] 93 (11/22 0841) Resp:  [18-24] 20 (11/22 0614) BP: (94-132)/(56-78) 122/62 mmHg (11/22 0841) SpO2:  [95 %-100 %] 99 % (11/22 0841) Weight change:  Last BM Date: 02/25/14  Intake/Output from previous day: 11/21 0701 - 11/22 0700 In: 483 [P.O.:480; I.V.:3] Out: 2525 [Urine:2525]  PHYSICAL EXAM General appearance: alert, cooperative and mild distress Resp: clear to auscultation bilaterally Cardio: regular rate and rhythm, S1, S2 normal, no murmur, click, rub or gallop GI: soft, non-tender; bowel sounds normal; no masses,  no organomegaly Extremities: extremities normal, atraumatic, no cyanosis or edema  Lab Results:  Results for orders placed or performed during the hospital encounter of 02/25/14 (from the past 48 hour(s))  Troponin I     Status: None   Collection Time: 02/25/14 10:00 PM  Result Value Ref Range   Troponin I <0.30 <0.30 ng/mL    Comment:        Due to the release kinetics of cTnI, a negative result within the first hours of the onset of symptoms does not rule out myocardial infarction with certainty. If myocardial infarction is still suspected, repeat the test at appropriate intervals.   CBC with Differential     Status: Abnormal   Collection Time: 02/25/14 10:00 PM  Result Value Ref Range   WBC 8.3 4.0 - 10.5 K/uL   RBC 4.43 4.22 - 5.81 MIL/uL   Hemoglobin 13.5 13.0 - 17.0 g/dL   HCT 38.5 (L) 39.0 - 52.0 %   MCV 86.9 78.0 - 100.0 fL   MCH 30.5 26.0 - 34.0 pg   MCHC 35.1 30.0 - 36.0 g/dL   RDW 13.6 11.5 - 15.5 %   Platelets 230 150 - 400 K/uL   Neutrophils Relative % 79 (H) 43 - 77 %   Neutro Abs 6.4 1.7 - 7.7 K/uL   Lymphocytes Relative 14 12 - 46 %   Lymphs Abs 1.2 0.7 - 4.0 K/uL   Monocytes Relative 6 3 - 12 %   Monocytes Absolute 0.5 0.1 - 1.0 K/uL   Eosinophils Relative 1 0 - 5 %   Eosinophils Absolute 0.1 0.0 - 0.7 K/uL   Basophils Relative 0 0 - 1 %   Basophils Absolute 0.0 0.0 - 0.1 K/uL  Pro b natriuretic peptide     Status: Abnormal   Collection Time: 02/25/14 10:00 PM  Result Value Ref Range   Pro B Natriuretic peptide (BNP) 1014.0 (H) 0 - 125 pg/mL  Comprehensive metabolic panel     Status: Abnormal   Collection Time: 02/25/14 10:00 PM  Result Value Ref Range   Sodium 135 (L) 137 - 147 mEq/L   Potassium 4.3 3.7 - 5.3 mEq/L   Chloride 93 (L) 96 - 112 mEq/L   CO2 29 19 - 32 mEq/L   Glucose, Bld 418 (H) 70 - 99 mg/dL   BUN 18 6 - 23 mg/dL   Creatinine, Ser 0.97 0.50 - 1.35 mg/dL   Calcium 9.7 8.4 - 10.5 mg/dL   Total Protein 6.8 6.0 - 8.3 g/dL   Albumin 3.6 3.5 - 5.2 g/dL   AST 11 0 - 37 U/L   ALT 11 0 - 53 U/L   Alkaline Phosphatase 90  39 - 117 U/L   Total Bilirubin 0.3 0.3 - 1.2 mg/dL   GFR calc non Af Amer 85 (L) >90 mL/min   GFR calc Af Amer >90 >90 mL/min    Comment: (NOTE) The eGFR has been calculated using the CKD EPI equation. This calculation has not been validated in all clinical situations. eGFR's persistently <90 mL/min signify possible Chronic Kidney Disease.    Anion gap 13 5 - 15  Digoxin level     Status: Abnormal   Collection Time: 02/25/14 11:31 PM  Result Value Ref Range   Digoxin Level <0.3 (L) 0.8 - 2.0 ng/mL  Hemoglobin A1c     Status: Abnormal   Collection Time: 02/26/14  3:12 AM  Result Value Ref Range   Hgb A1c MFr Bld 12.3 (H) <5.7 %    Comment: (NOTE)                                                                       According to the ADA Clinical Practice Recommendations for 2011, when HbA1c is used as a screening test:  >=6.5%   Diagnostic of Diabetes Mellitus           (if abnormal result is confirmed) 5.7-6.4%   Increased risk of developing Diabetes Mellitus References:Diagnosis and Classification of  Diabetes Mellitus,Diabetes ASTM,1962,22(LNLGX 1):S62-S69 and Standards of Medical Care in         Diabetes - 2011,Diabetes Care,2011,34 (Suppl 1):S11-S61.    Mean Plasma Glucose 306 (H) <117 mg/dL    Comment: Performed at [facility]  CBC     Status: Abnormal   Collection Time: 02/26/14  3:12 AM  Result Value Ref Range   WBC 6.3 4.0 - 10.5 K/uL   RBC 4.20 (L) 4.22 - 5.81 MIL/uL   Hemoglobin 12.7 (L) 13.0 - 17.0 g/dL   HCT 36.4 (L) 39.0 - 52.0 %   MCV 86.7 78.0 - 100.0 fL   MCH 30.2 26.0 - 34.0 pg   MCHC 34.9 30.0 - 36.0 g/dL   RDW 13.6 11.5 - 15.5 %   Platelets 188 150 - 400 K/uL  Creatinine, serum     Status: Abnormal   Collection Time: 02/26/14  3:12 AM  Result Value Ref Range   Creatinine, Ser 0.99 0.50 - 1.35 mg/dL   GFR calc non Af Amer 84 (L) >90 mL/min   GFR calc Af Amer >90 >90 mL/min    Comment: (NOTE) The eGFR has been calculated using the CKD EPI equation. This calculation has not been validated in all clinical situations. eGFR's persistently <90 mL/min signify possible Chronic Kidney Disease.   Troponin I     Status: None   Collection Time: 02/26/14  3:12 AM  Result Value Ref Range   Troponin I <0.30 <0.30 ng/mL    Comment:        Due to the release kinetics of cTnI, a negative result within the first hours of the onset of symptoms does not rule out myocardial infarction with certainty. If myocardial infarction is still suspected, repeat the test at appropriate intervals.   Glucose, capillary     Status: Abnormal   Collection Time: 02/26/14  3:14 AM  Result Value Ref Range   Glucose-Capillary 280 (H)  70 - 99 mg/dL   Comment 1 Notify RN   Glucose, capillary     Status: Abnormal   Collection Time: 02/26/14  7:46 AM  Result Value Ref Range   Glucose-Capillary 293 (H) 70 - 99 mg/dL  CBC     Status: None   Collection Time: 02/26/14  8:43 AM  Result Value Ref Range   WBC 7.5 4.0 - 10.5 K/uL   RBC 4.66 4.22 - 5.81 MIL/uL   Hemoglobin 14.1 13.0 -  17.0 g/dL   HCT 40.5 39.0 - 52.0 %   MCV 86.9 78.0 - 100.0 fL   MCH 30.3 26.0 - 34.0 pg   MCHC 34.8 30.0 - 36.0 g/dL   RDW 13.8 11.5 - 15.5 %   Platelets 208 150 - 400 K/uL  Comprehensive metabolic panel     Status: Abnormal   Collection Time: 02/26/14  8:43 AM  Result Value Ref Range   Sodium 137 137 - 147 mEq/L   Potassium 3.9 3.7 - 5.3 mEq/L   Chloride 95 (L) 96 - 112 mEq/L   CO2 28 19 - 32 mEq/L   Glucose, Bld 313 (H) 70 - 99 mg/dL   BUN 15 6 - 23 mg/dL   Creatinine, Ser 0.97 0.50 - 1.35 mg/dL   Calcium 9.6 8.4 - 10.5 mg/dL   Total Protein 7.0 6.0 - 8.3 g/dL   Albumin 3.8 3.5 - 5.2 g/dL   AST 10 0 - 37 U/L   ALT 11 0 - 53 U/L   Alkaline Phosphatase 77 39 - 117 U/L   Total Bilirubin 0.5 0.3 - 1.2 mg/dL   GFR calc non Af Amer 85 (L) >90 mL/min   GFR calc Af Amer >90 >90 mL/min    Comment: (NOTE) The eGFR has been calculated using the CKD EPI equation. This calculation has not been validated in all clinical situations. eGFR's persistently <90 mL/min signify possible Chronic Kidney Disease.    Anion gap 14 5 - 15  Troponin I     Status: None   Collection Time: 02/26/14  8:43 AM  Result Value Ref Range   Troponin I <0.30 <0.30 ng/mL    Comment:        Due to the release kinetics of cTnI, a negative result within the first hours of the onset of symptoms does not rule out myocardial infarction with certainty. If myocardial infarction is still suspected, repeat the test at appropriate intervals.   Glucose, capillary     Status: Abnormal   Collection Time: 02/26/14 11:33 AM  Result Value Ref Range   Glucose-Capillary 348 (H) 70 - 99 mg/dL  Troponin I     Status: None   Collection Time: 02/26/14  2:26 PM  Result Value Ref Range   Troponin I <0.30 <0.30 ng/mL    Comment:        Due to the release kinetics of cTnI, a negative result within the first hours of the onset of symptoms does not rule out myocardial infarction with certainty. If myocardial infarction is  still suspected, repeat the test at appropriate intervals.   Glucose, capillary     Status: Abnormal   Collection Time: 02/26/14  5:00 PM  Result Value Ref Range   Glucose-Capillary 359 (H) 70 - 99 mg/dL   Comment 1 Notify RN    Comment 2 Documented in Chart   Troponin I     Status: None   Collection Time: 02/26/14  8:35 PM  Result Value Ref Range  Troponin I <0.30 <0.30 ng/mL    Comment:        Due to the release kinetics of cTnI, a negative result within the first hours of the onset of symptoms does not rule out myocardial infarction with certainty. If myocardial infarction is still suspected, repeat the test at appropriate intervals.   Glucose, capillary     Status: Abnormal   Collection Time: 02/26/14  9:26 PM  Result Value Ref Range   Glucose-Capillary 413 (H) 70 - 99 mg/dL   Comment 1 Notify RN    Comment 2 Documented in Chart   Troponin I     Status: None   Collection Time: 02/27/14  2:01 AM  Result Value Ref Range   Troponin I <0.30 <0.30 ng/mL    Comment:        Due to the release kinetics of cTnI, a negative result within the first hours of the onset of symptoms does not rule out myocardial infarction with certainty. If myocardial infarction is still suspected, repeat the test at appropriate intervals.   Glucose, capillary     Status: Abnormal   Collection Time: 02/27/14  8:23 AM  Result Value Ref Range   Glucose-Capillary 108 (H) 70 - 99 mg/dL  Troponin I     Status: None   Collection Time: 02/27/14  8:31 AM  Result Value Ref Range   Troponin I <0.30 <0.30 ng/mL    Comment:        Due to the release kinetics of cTnI, a negative result within the first hours of the onset of symptoms does not rule out myocardial infarction with certainty. If myocardial infarction is still suspected, repeat the test at appropriate intervals.     ABGS No results for input(s): PHART, PO2ART, TCO2, HCO3 in the last 72 hours.  Invalid input(s): PCO2 CULTURES No  results found for this or any previous visit (from the past 240 hour(s)). Studies/Results: Dg Chest Portable 1 View  02/25/2014   CLINICAL DATA:  Mid to left-sided chest pain, radiating to the left shoulder, neck, and jaw. Shortness of breath today.  EXAM: PORTABLE CHEST - 1 VIEW  COMPARISON:  12/14/2013  FINDINGS: Postoperative changes in the mediastinum. Shallow inspiration. Cardiac enlargement. Pulmonary vascularity appears normal. Diffuse interstitial pattern to the lungs consistent with chronic infiltration or fibrosis and unchanged since prior studies. No focal consolidation. No blunting of costophrenic angles. No pneumothorax. Calcification of the aorta.  IMPRESSION: Cardiac enlargement. Unchanged appearance of chronic interstitial lung disease.   Electronically Signed   By: Lucienne Capers M.D.   On: 02/25/2014 22:55    Medications:  Prior to Admission:  Prescriptions prior to admission  Medication Sig Dispense Refill Last Dose  . Aspirin-Salicylamide-Caffeine (BC FAST PAIN RELIEF ARTHRITIS) 376-283-15 MG PACK Take 1 packet by mouth 3 (three) times daily as needed (for pain).   02/24/2014  . atorvastatin (LIPITOR) 80 MG tablet Take 1 tablet (80 mg total) by mouth daily. 90 tablet 3 02/24/2014  . carvedilol (COREG) 6.25 MG tablet Take 1 tablet (6.25 mg total) by mouth 2 (two) times daily. 180 tablet 3 02/24/2014 at 2000  . digoxin (LANOXIN) 0.25 MG tablet Take 1 tablet (0.25 mg total) by mouth daily. 30 tablet 12 02/24/2014  . furosemide (LASIX) 80 MG tablet Take 80 mg by mouth 2 (two) times daily.   02/24/2014  . HYDROcodone-acetaminophen (NORCO) 7.5-325 MG per tablet Take 1 tablet by mouth every 6 (six) hours as needed for moderate pain (pain in leg  and shoulder).   UNKNOWN  . insulin NPH (HUMULIN N,NOVOLIN N) 100 UNIT/ML injection Inject 110 Units into the skin 2 (two) times daily.   02/24/2014  . isosorbide mononitrate (IMDUR) 30 MG 24 hr tablet Take 1 tablet (30 mg total) by mouth  daily. 90 tablet 3 02/24/2014  . KLOR-CON M20 20 MEQ tablet Take 20 mEq by mouth daily.    02/24/2014  . nitroGLYCERIN (NITROSTAT) 0.4 MG SL tablet Place 0.4 mg under the tongue every 5 (five) minutes as needed for chest pain.    02/25/2014 at Unknown time  . oxyCODONE-acetaminophen (PERCOCET/ROXICET) 5-325 MG per tablet Take 1 tablet by mouth 4 (four) times daily as needed for moderate pain or severe pain (for knee and shoulder).    UNKNOWN  . vitamin B-12 (CYANOCOBALAMIN) 1000 MCG tablet Take 1,000 mcg by mouth daily.   02/24/2014  . vitamin C (ASCORBIC ACID) 500 MG tablet Take 1,000 mg by mouth at bedtime.   02/24/2014  . vitamin E 400 UNIT capsule Take 400 Units by mouth at bedtime.    02/24/2014   Scheduled: . antiseptic oral rinse  7 mL Mouth Rinse BID  . aspirin EC  81 mg Oral Daily  . atorvastatin  80 mg Oral Daily  . carvedilol  6.25 mg Oral BID WC  . digoxin  0.25 mg Oral Daily  . enoxaparin (LOVENOX) injection  40 mg Subcutaneous Q24H  . furosemide  80 mg Oral BID  . insulin aspart  0-9 Units Subcutaneous TID WC  . insulin NPH Human  110 Units Subcutaneous BID AC & HS  . isosorbide mononitrate  30 mg Oral Daily  . lisinopril  2.5 mg Oral Daily  . potassium chloride SA  20 mEq Oral Daily  . sodium chloride  3 mL Intravenous Q12H   Continuous:  ERQ:SXQKSK chloride, cyclobenzaprine, HYDROcodone-acetaminophen, HYDROmorphone (DILAUDID) injection, nitroGLYCERIN, ondansetron **OR** ondansetron (ZOFRAN) IV, sodium chloride  Assesment: He's in with chest pain. He has ischemic cardiomyopathy and apparently it is felt there is not really anything else can be done except medical treatment. He is improving. He is negative for myocardial infarction. He has diabetes and his blood sugar is better this morning. Active Problems:   Diabetes mellitus   Hyperlipidemia   Ischemic cardiomyopathy   COPD, severe  O2 dependent   Rotator cuff syndrome of left shoulder   Chest pain   DNR no code (do  not resuscitate)   Chest pain, rule out acute myocardial infarction    Plan: Attempt to get him up and moving around.    LOS: 2 days   Ashwath Lasch L 02/27/2014, 9:42 AM

## 2014-02-28 DIAGNOSIS — I255 Ischemic cardiomyopathy: Secondary | ICD-10-CM

## 2014-02-28 LAB — GLUCOSE, CAPILLARY
GLUCOSE-CAPILLARY: 200 mg/dL — AB (ref 70–99)
GLUCOSE-CAPILLARY: 175 mg/dL — AB (ref 70–99)
Glucose-Capillary: 79 mg/dL (ref 70–99)
Glucose-Capillary: 79 mg/dL (ref 70–99)
Glucose-Capillary: 86 mg/dL (ref 70–99)
Glucose-Capillary: 138 mg/dL — ABNORMAL HIGH (ref 70–99)
Glucose-Capillary: 118 mg/dL — ABNORMAL HIGH (ref 70–99)

## 2014-02-28 MED ORDER — INSULIN NPH (HUMAN) (ISOPHANE) 100 UNIT/ML ~~LOC~~ SUSP
90.0000 [IU] | Freq: Two times a day (BID) | SUBCUTANEOUS | Status: DC
  Administered 2014-02-28: 90 [IU] via SUBCUTANEOUS

## 2014-02-28 MED ORDER — ISOSORBIDE MONONITRATE ER 60 MG PO TB24: 60.0000 mg | ORAL_TABLET | Freq: Every day | ORAL | Status: DC

## 2014-02-28 MED ORDER — ISOSORBIDE MONONITRATE ER 30 MG PO TB24: 60.0000 mg | ORAL_TABLET | Freq: Every day | ORAL | Status: DC

## 2014-02-28 MED ORDER — RANOLAZINE ER 500 MG PO TB12
500.0000 mg | ORAL_TABLET | Freq: Two times a day (BID) | ORAL | Status: DC
  Administered 2014-02-28 – 2014-03-01 (×3): 500 mg via ORAL
  Filled 2014-02-28 (×3): qty 1

## 2014-02-28 MED ORDER — ISOSORBIDE MONONITRATE ER 30 MG PO TB24
15.0000 mg | ORAL_TABLET | Freq: Every day | ORAL | Status: DC
  Administered 2014-02-28 – 2014-03-01 (×2): 15 mg via ORAL
  Filled 2014-02-28 (×2): qty 1

## 2014-02-28 MED ORDER — FUROSEMIDE 80 MG PO TABS
80.0000 mg | ORAL_TABLET | Freq: Every day | ORAL | Status: DC
  Administered 2014-03-01: 80 mg via ORAL
  Filled 2014-02-28 (×2): qty 1

## 2014-02-28 NOTE — Progress Notes (Signed)
Subjective: He says he feels better. No new complaints. No further chest pain. His blood sugar has been slightly low. He had some low blood pressure during the day yesterday  Objective: Vital signs in last 24 hours: Temp:  [98 F (36.7 C)-98.7 F (37.1 C)] 98.6 F (37 C) (11/23 0617) Pulse Rate:  [51-103] 51 (11/23 0617) Resp:  [20] 20 (11/23 0617) BP: (80-152)/(50-67) 116/58 mmHg (11/23 0617) SpO2:  [96 %-98 %] 98 % (11/23 0617) Weight change:  Last BM Date: 02/25/14  Intake/Output from previous day: 11/22 0701 - 11/23 0700 In: 960 [P.O.:960] Out: -   PHYSICAL EXAM General appearance: alert, cooperative and no distress Resp: rhonchi bilaterally Cardio: regular rate and rhythm, S1, S2 normal, no murmur, click, rub or gallop GI: soft, non-tender; bowel sounds normal; no masses,  no organomegaly Extremities: extremities normal, atraumatic, no cyanosis or edema  Lab Results:  Results for orders placed or performed during the hospital encounter of 02/25/14 (from the past 48 hour(s))  Glucose, capillary     Status: Abnormal   Collection Time: 02/26/14 11:33 AM  Result Value Ref Range   Glucose-Capillary 348 (H) 70 - 99 mg/dL  Troponin I     Status: None   Collection Time: 02/26/14  2:26 PM  Result Value Ref Range   Troponin I <0.30 <0.30 ng/mL    Comment:        Due to the release kinetics of cTnI, a negative result within the first hours of the onset of symptoms does not rule out myocardial infarction with certainty. If myocardial infarction is still suspected, repeat the test at appropriate intervals.   Glucose, capillary     Status: Abnormal   Collection Time: 02/26/14  5:00 PM  Result Value Ref Range   Glucose-Capillary 359 (H) 70 - 99 mg/dL   Comment 1 Notify RN    Comment 2 Documented in Chart   Troponin I     Status: None   Collection Time: 02/26/14  8:35 PM  Result Value Ref Range   Troponin I <0.30 <0.30 ng/mL    Comment:        Due to the release  kinetics of cTnI, a negative result within the first hours of the onset of symptoms does not rule out myocardial infarction with certainty. If myocardial infarction is still suspected, repeat the test at appropriate intervals.   Glucose, capillary     Status: Abnormal   Collection Time: 02/26/14  9:26 PM  Result Value Ref Range   Glucose-Capillary 413 (H) 70 - 99 mg/dL   Comment 1 Notify RN    Comment 2 Documented in Chart   Troponin I     Status: None   Collection Time: 02/27/14  2:01 AM  Result Value Ref Range   Troponin I <0.30 <0.30 ng/mL    Comment:        Due to the release kinetics of cTnI, a negative result within the first hours of the onset of symptoms does not rule out myocardial infarction with certainty. If myocardial infarction is still suspected, repeat the test at appropriate intervals.   Glucose, capillary     Status: Abnormal   Collection Time: 02/27/14  8:23 AM  Result Value Ref Range   Glucose-Capillary 108 (H) 70 - 99 mg/dL  Troponin I     Status: None   Collection Time: 02/27/14  8:31 AM  Result Value Ref Range   Troponin I <0.30 <0.30 ng/mL    Comment:  Due to the release kinetics of cTnI, a negative result within the first hours of the onset of symptoms does not rule out myocardial infarction with certainty. If myocardial infarction is still suspected, repeat the test at appropriate intervals.   Glucose, capillary     Status: Abnormal   Collection Time: 02/27/14 11:32 AM  Result Value Ref Range   Glucose-Capillary 126 (H) 70 - 99 mg/dL  Troponin I     Status: None   Collection Time: 02/27/14  2:17 PM  Result Value Ref Range   Troponin I <0.30 <0.30 ng/mL    Comment:        Due to the release kinetics of cTnI, a negative result within the first hours of the onset of symptoms does not rule out myocardial infarction with certainty. If myocardial infarction is still suspected, repeat the test at appropriate intervals.   Glucose,  capillary     Status: None   Collection Time: 02/27/14  4:35 PM  Result Value Ref Range   Glucose-Capillary 85 70 - 99 mg/dL   Comment 1 Notify RN    Comment 2 Documented in Chart   Glucose, capillary     Status: Abnormal   Collection Time: 02/27/14  9:51 PM  Result Value Ref Range   Glucose-Capillary 154 (H) 70 - 99 mg/dL   Comment 1 Documented in Chart    Comment 2 Notify RN   Glucose, capillary     Status: None   Collection Time: 02/28/14  3:51 AM  Result Value Ref Range   Glucose-Capillary 79 70 - 99 mg/dL   Comment 1 Documented in Chart    Comment 2 Notify RN   Glucose, capillary     Status: None   Collection Time: 02/28/14  4:07 AM  Result Value Ref Range   Glucose-Capillary 79 70 - 99 mg/dL  Glucose, capillary     Status: Abnormal   Collection Time: 02/28/14  4:57 AM  Result Value Ref Range   Glucose-Capillary 200 (H) 70 - 99 mg/dL   Comment 1 Documented in Chart    Comment 2 Notify RN   Glucose, capillary     Status: None   Collection Time: 02/28/14  7:52 AM  Result Value Ref Range   Glucose-Capillary 86 70 - 99 mg/dL   Comment 1 Notify RN     ABGS No results for input(s): PHART, PO2ART, TCO2, HCO3 in the last 72 hours.  Invalid input(s): PCO2 CULTURES No results found for this or any previous visit (from the past 240 hour(s)). Studies/Results: No results found.  Medications:  Prior to Admission:  Prescriptions prior to admission  Medication Sig Dispense Refill Last Dose  . Aspirin-Salicylamide-Caffeine (BC FAST PAIN RELIEF ARTHRITIS) [id number] MG PACK Take 1 packet by mouth 3 (three) times daily as needed (for pain).   02/24/2014  . atorvastatin (LIPITOR) 80 MG tablet Take 1 tablet (80 mg total) by mouth daily. 90 tablet 3 02/24/2014  . carvedilol (COREG) 6.25 MG tablet Take 1 tablet (6.25 mg total) by mouth 2 (two) times daily. 180 tablet 3 02/24/2014 at 2000  . digoxin (LANOXIN) 0.25 MG tablet Take 1 tablet (0.25 mg total) by mouth daily. 30 tablet 12  02/24/2014  . furosemide (LASIX) 80 MG tablet Take 80 mg by mouth 2 (two) times daily.   02/24/2014  . HYDROcodone-acetaminophen (NORCO) 7.5-325 MG per tablet Take 1 tablet by mouth every 6 (six) hours as needed for moderate pain (pain in leg and shoulder).   UNKNOWN  .  insulin NPH (HUMULIN N,NOVOLIN N) 100 UNIT/ML injection Inject 110 Units into the skin 2 (two) times daily.   02/24/2014  . isosorbide mononitrate (IMDUR) 30 MG 24 hr tablet Take 1 tablet (30 mg total) by mouth daily. 90 tablet 3 02/24/2014  . KLOR-CON M20 20 MEQ tablet Take 20 mEq by mouth daily.    02/24/2014  . nitroGLYCERIN (NITROSTAT) 0.4 MG SL tablet Place 0.4 mg under the tongue every 5 (five) minutes as needed for chest pain.    02/25/2014 at Unknown time  . oxyCODONE-acetaminophen (PERCOCET/ROXICET) 5-325 MG per tablet Take 1 tablet by mouth 4 (four) times daily as needed for moderate pain or severe pain (for knee and shoulder).    UNKNOWN  . vitamin B-12 (CYANOCOBALAMIN) 1000 MCG tablet Take 1,000 mcg by mouth daily.   02/24/2014  . vitamin C (ASCORBIC ACID) 500 MG tablet Take 1,000 mg by mouth at bedtime.   02/24/2014  . vitamin E 400 UNIT capsule Take 400 Units by mouth at bedtime.    02/24/2014   Scheduled: . antiseptic oral rinse  7 mL Mouth Rinse BID  . aspirin EC  81 mg Oral Daily  . atorvastatin  80 mg Oral Daily  . carvedilol  6.25 mg Oral BID WC  . digoxin  0.25 mg Oral Daily  . enoxaparin (LOVENOX) injection  40 mg Subcutaneous Q24H  . furosemide  80 mg Oral BID  . insulin aspart  0-9 Units Subcutaneous TID WC  . insulin NPH Human  90 Units Subcutaneous BID AC & HS  . isosorbide mononitrate  60 mg Oral Daily  . lisinopril  2.5 mg Oral Daily  . potassium chloride SA  20 mEq Oral Daily  . sodium chloride  3 mL Intravenous Q12H   Continuous:  OZH:[id number]:sodium chloride, cyclobenzaprine, HYDROcodone-acetaminophen, HYDROmorphone (DILAUDID) injection, nitroGLYCERIN, ondansetron **OR** ondansetron (ZOFRAN) IV, sodium  chloride  Assesment: He was admitted with chest pain. He has ruled out for MI. He has known severe cardiac ischemia and ischemic cardiomyopathy and is not felt to be a candidate for any invasive testing or treatment. He has diabetes and his blood sugar was slightly low. Active Problems:   Diabetes mellitus   Hyperlipidemia   Ischemic cardiomyopathy   COPD, severe  O2 dependent   Rotator cuff syndrome of left shoulder   Chest pain   DNR no code (do not resuscitate)   Chest pain, rule out acute myocardial infarction    Plan: I will increase his Imdur to 60 mg but we need to watch his blood pressure with that. He may be able to go home later today    LOS: 3 days   Jaesean Litzau L 02/28/2014, 8:52 AM

## 2014-02-28 NOTE — Consult Note (Addendum)
Primary cardiologist: Dr Dina Rich  Consulting cardiologist: Dr Dina Rich  63 yo male hx of DM2, CAD, chronic systolic HF, hyperlipidemia, PAD, HTN, DNR status admitted with chest pain. Reports episodes of chest pain on and off throughout the week. Aching/pressure like pain in mid chest to left chest and jaw 8/10, some associated SOB. Lasts just a few minutes. Can occur at rest or with exertion. Better with NG. Reports similar to prior chest pain. He also reports increased orthostatic symptoms, significant lightheadness with standing. Reports episode of presyncope after standing from the commode.   More detailed cardiac history, he has a history of CABG 20 years ago at [facility] as well as prior stenting. Last cath 01/2013: LM 60%, LAD 60% mid, LCX prox patent stent with distal occluded LCX, RCA occluded. SVG-RCA occluded, SVG-OM occluded. Overall stable disease. This disease has been medically managed due to poor targets and multiple advanced comorbidities. Echo 01/2013 LVEF 15-20%. He does not have and ICD as he is DNR. Medical therapy for his chronic systolic HF has been somewhat limited due to orthostatic symptoms.    Trop neg x3, Hgb 14.1, Plt 208, K 3.9, Cr 0.97, GFR 85, pro-BNP 1014 CXR cardiomegaly, chronic insterstitial disease EKG SR, LAE, TWI lateral leads, inferior Q waves.    Clinical Summary Mr. Cayson is a 63 y.o.male   Allergies  Allergen Reactions  . Baycol [Cerivastatin]   . Stadol [Butorphanol Tartrate] Other (See Comments)    "makes him crazy"    Medications Scheduled Medications: . antiseptic oral rinse  7 mL Mouth Rinse BID  . aspirin EC  81 mg Oral Daily  . atorvastatin  80 mg Oral Daily  . carvedilol  6.25 mg Oral BID WC  . digoxin  0.25 mg Oral Daily  . enoxaparin (LOVENOX) injection  40 mg Subcutaneous Q24H  . furosemide  80 mg Oral BID  . insulin aspart  0-9 Units Subcutaneous TID WC  . insulin NPH Human  90 Units Subcutaneous BID AC & HS  .  isosorbide mononitrate  60 mg Oral Daily  . lisinopril  2.5 mg Oral Daily  . potassium chloride SA  20 mEq Oral Daily  . sodium chloride  3 mL Intravenous Q12H     Infusions:     PRN Medications:  sodium chloride, cyclobenzaprine, HYDROcodone-acetaminophen, HYDROmorphone (DILAUDID) injection, nitroGLYCERIN, ondansetron **OR** ondansetron (ZOFRAN) IV, sodium chloride   Past Medical History  Diagnosis Date  . Uncontrolled diabetes mellitus     a. A1C 12.8 in 01/2013.  [name] Chronic systolic heart failure   . Obesity   . Hyperlipidemia   . Peripheral vascular disease   . Hypertension   . Asthma   . CAD (coronary artery disease)     a. Prior CABG 20 yrs ago (?~1994). b. Hx of multiple stents, prev followed in [employer]. c. [place] 01/2013: cath moderate disease in LAD, distal LCx, prox RCA occluded, 2 SVGs occluded, felt to be stable from prior cath -> for medical therapy.  . Ischemic cardiomyopathy     a. EF 2012: 55-60. b. EF 12/2011: 25-30%. c. Echo 01/2013: EF 15-20%.  . Stroke     15 years ago  . Arthritis   . Anxiety   . Depression   . 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 Chronic respiratory failure     a. Due to COPD.  . Osteomyelitis 2013    was  planned for chronic suppressive antibiotics but cannot afford this any longer  . DNR no code (do not resuscitate) 07/2013    Past Surgical History  Procedure Laterality Date  . Coronary artery bypass graft    . Coronary stent placement  2008    CABG grafts closed  . Joint replacement    . Cholecystectomy    . Eye surgery    . I&d extremity  06/12/2011    Procedure: IRRIGATION AND DEBRIDEMENT EXTREMITY;  Surgeon: Javier DockerJeffrey C Beane, MD;  Location: [facility] OR;  Service: Orthopedics;  Laterality: Right;  I&D Right Tibia    Family History  Problem Relation Age of Onset  . Dementia Mother   . Arthritis Mother   . Heart disease Father   . Heart disease Brother     Social  History Mr. Maisie Fushomas reports that he quit smoking about 30 years ago. He has never used smokeless tobacco. Mr. Maisie Fushomas reports that he does not drink alcohol.  Review of Systems CONSTITUTIONAL: No weight loss, fever, chills, weakness or fatigue.  HEENT: Eyes: No visual loss, blurred vision, double vision or yellow sclerae. No hearing loss, sneezing, congestion, runny nose or sore throat.  SKIN: No rash or itching.  CARDIOVASCULAR: per HPI RESPIRATORY: No shortness of breath, cough or sputum.  GASTROINTESTINAL: No anorexia, nausea, vomiting or diarrhea. No abdominal pain or blood.  GENITOURINARY: no polyuria, no dysuria NEUROLOGICAL: +dizziness.  MUSCULOSKELETAL: No muscle, back pain, joint pain or stiffness.  HEMATOLOGIC: No anemia, bleeding or bruising.  LYMPHATICS: No enlarged nodes. No history of splenectomy.  PSYCHIATRIC: No history of depression or anxiety.      Physical Examination Blood pressure 116/58, pulse 51, temperature 98.6 F (37 C), temperature source Oral, resp. rate 20, height 6\' 3"  (1.905 m), weight 240 lb 9.6 oz (109.135 kg), SpO2 98 %.  Intake/Output Summary (Last 24 hours) at 02/28/14 0938 Last data filed at 02/27/14 1847  Gross per 24 hour  Intake    480 ml  Output      0 ml  Net    480 ml    HEENT: sclera clear, throat clear  Cardiovascular: RRR, no m/r/g, no JVD  Respiratory: CTAB  GI: abdomen soft, NT, ND  MSK: no LE edema  Neuro: no focal deficits  Psych: appropraite affect   Lab Results  Basic Metabolic Panel:  Recent Labs Lab 02/25/14 2200 02/26/14 0312 02/26/14 0843  NA 135*  --  137  K 4.3  --  3.9  CL 93*  --  95*  CO2 29  --  28  GLUCOSE 418*  --  313*  BUN 18  --  15  CREATININE 0.97 0.99 0.97  CALCIUM 9.7  --  9.6    Liver Function Tests:  Recent Labs Lab 02/25/14 2200 02/26/14 0843  AST 11 10  ALT 11 11  ALKPHOS 90 77  BILITOT 0.3 0.5  PROT 6.8 7.0  ALBUMIN 3.6 3.8    CBC:  Recent Labs Lab  02/25/14 2200 02/26/14 0312 02/26/14 0843  WBC 8.3 6.3 7.5  NEUTROABS 6.4  --   --   HGB 13.5 12.7* 14.1  HCT 38.5* 36.4* 40.5  MCV 86.9 86.7 86.9  PLT 230 188 208    Cardiac Enzymes:  Recent Labs Lab 02/26/14 1426 02/26/14 2035 02/27/14 0201 02/27/14 0831 02/27/14 1417  TROPONINI <0.30 <0.30 <0.30 <0.30 <0.30    BNP: Invalid input(s): POCBNP    Impression/Recommendations 1. Chest pain - history of CAD and ICM  as described in detail above. Last cath 01/2013 with diffuse disease but no revasc targets at that time. Noted moderate disease of LM and LAD at that time that could have potentially progressed. Will obtain Lexiscan MPI to further evaluate and risk stratify potential new territories of ischemia. He had breakfast this AM, will make NPO and plan testing for tomorrow.  - medical therapy limited by orthostatic symptoms, will start ranexa for antianginal effects without lowering bp. Decrease imdur, contiue beta blocker at current dose  2. Orthostatic syncope - likely related to poor oral hydration, diuretics, and CHF meds - will decrease imdur, change lasix to 80mg  qday with instructions to weigh daily and take bid if increased weight. - primarily drinks caffeinated beverages, counseled on there diuretic effects and encouraged to drink more water.  - will have nursing staff check orthostatics.   3. PSVT - occasional episodes noted on tele. Limited on titration of beta blocker due to orthostatic symptoms. Continue coreg and digoxin at current doses.        Dina RichJonathan Wah Sabic, M.D.,

## 2014-03-01 ENCOUNTER — Inpatient Hospital Stay (HOSPITAL_COMMUNITY): Payer: Medicare HMO

## 2014-03-01 ENCOUNTER — Encounter (HOSPITAL_COMMUNITY): Payer: Self-pay

## 2014-03-01 DIAGNOSIS — R079 Chest pain, unspecified: Secondary | ICD-10-CM

## 2014-03-01 LAB — GLUCOSE, CAPILLARY
GLUCOSE-CAPILLARY: 135 mg/dL — AB (ref 70–99)
Glucose-Capillary: 72 mg/dL (ref 70–99)
Glucose-Capillary: 95 mg/dL (ref 70–99)
Glucose-Capillary: 79 mg/dL (ref 70–99)

## 2014-03-01 MED ORDER — TECHNETIUM TC 99M SESTAMIBI GENERIC - CARDIOLITE
30.0000 | Freq: Once | INTRAVENOUS | Status: AC | PRN
  Administered 2014-03-01: 30 via INTRAVENOUS

## 2014-03-01 MED ORDER — REGADENOSON 0.4 MG/5ML IV SOLN
INTRAVENOUS | Status: AC
  Administered 2014-03-01: 0.4 mg via INTRAVENOUS
  Filled 2014-03-01: qty 5

## 2014-03-01 MED ORDER — DEXTROSE 50 % IV SOLN
25.0000 mL | Freq: Once | INTRAVENOUS | Status: AC
  Administered 2014-03-01: 25 mL via INTRAVENOUS

## 2014-03-01 MED ORDER — DEXTROSE 50 % IV SOLN
INTRAVENOUS | Status: AC
  Filled 2014-03-01: qty 50

## 2014-03-01 MED ORDER — REGADENOSON 0.4 MG/5ML IV SOLN
0.4000 mg | Freq: Once | INTRAVENOUS | Status: AC | PRN
  Administered 2014-03-01: 0.4 mg via INTRAVENOUS
  Filled 2014-03-01: qty 5

## 2014-03-01 MED ORDER — INSULIN NPH (HUMAN) (ISOPHANE) 100 UNIT/ML ~~LOC~~ SUSP: 75.0000 [IU] | Freq: Two times a day (BID) | SUBCUTANEOUS | Status: DC

## 2014-03-01 MED ORDER — SODIUM CHLORIDE 0.9 % IJ SOLN
10.0000 mL | INTRAMUSCULAR | Status: DC | PRN
  Administered 2014-03-01: 10 mL via INTRAVENOUS
  Filled 2014-03-01: qty 10

## 2014-03-01 MED ORDER — SODIUM CHLORIDE 0.9 % IJ SOLN
INTRAMUSCULAR | Status: AC
  Administered 2014-03-01: 10 mL via INTRAVENOUS
  Filled 2014-03-01: qty 10

## 2014-03-01 MED ORDER — BISACODYL 5 MG PO TBEC
5.0000 mg | DELAYED_RELEASE_TABLET | Freq: Every day | ORAL | Status: DC | PRN
  Administered 2014-03-01: 5 mg via ORAL
  Filled 2014-03-01: qty 1

## 2014-03-01 MED ORDER — RANOLAZINE ER 500 MG PO TB12: 500.0000 mg | ORAL_TABLET | Freq: Two times a day (BID) | ORAL | Status: DC

## 2014-03-01 MED ORDER — TECHNETIUM TC 99M SESTAMIBI - CARDIOLITE
10.0000 | Freq: Once | INTRAVENOUS | Status: AC | PRN
  Administered 2014-03-01: 08:00:00 10 via INTRAVENOUS

## 2014-03-01 MED ORDER — ISOSORBIDE MONONITRATE 15 MG HALF TABLET: 15.0000 mg | ORAL_TABLET | Freq: Every day | ORAL | Status: DC

## 2014-03-01 NOTE — Progress Notes (Signed)
Timothy DecJames L Trujillo discharged home per MD order.  Discharge instructions reviewed and discussed with the patient, all questions and concerns answered. Copy of instructions and scripts given to patient.    Medication List    STOP taking these medications        aspirin EC 81 MG tablet     cyclobenzaprine 5 MG tablet  Commonly known as:  FLEXERIL     lisinopril 2.5 MG tablet  Commonly known as:  PRINIVIL,ZESTRIL      TAKE these medications        atorvastatin 80 MG tablet  Commonly known as:  LIPITOR  Take 1 tablet (80 mg total) by mouth daily.     BC FAST PAIN RELIEF ARTHRITIS [id number] MG Pack  Generic drug:  Aspirin-Salicylamide-Caffeine  Take 1 packet by mouth 3 (three) times daily as needed (for pain).     carvedilol 6.25 MG tablet  Commonly known as:  COREG  Take 1 tablet (6.25 mg total) by mouth 2 (two) times daily.     digoxin 0.25 MG tablet  Commonly known as:  LANOXIN  Take 1 tablet (0.25 mg total) by mouth daily.     furosemide 80 MG tablet  Commonly known as:  LASIX  Take 80 mg by mouth 2 (two) times daily.     HYDROcodone-acetaminophen 7.5-325 MG per tablet  Commonly known as:  NORCO  Take 1 tablet by mouth every 6 (six) hours as needed for moderate pain (pain in leg and shoulder).     insulin NPH Human 100 UNIT/ML injection  Commonly known as:  HUMULIN N,NOVOLIN N  Inject 0.75 mLs (75 Units total) into the skin 2 (two) times daily.     isosorbide mononitrate 15 mg Tb24 24 hr tablet  Commonly known as:  IMDUR  Take 0.5 tablets (15 mg total) by mouth daily.     KLOR-CON M20 20 MEQ tablet  Generic drug:  potassium chloride SA  Take 20 mEq by mouth daily.     nitroGLYCERIN 0.4 MG SL tablet  Commonly known as:  NITROSTAT  Place 0.4 mg under the tongue every 5 (five) minutes as needed for chest pain.     oxyCODONE-acetaminophen 5-325 MG per tablet  Commonly known as:  PERCOCET/ROXICET  Take 1 tablet by mouth 4 (four) times daily as needed for moderate  pain or severe pain (for knee and shoulder).     ranolazine 500 MG 12 hr tablet  Commonly known as:  RANEXA  Take 1 tablet (500 mg total) by mouth 2 (two) times daily.     vitamin B-12 1000 MCG tablet  Commonly known as:  CYANOCOBALAMIN  Take 1,000 mcg by mouth daily.     vitamin C 500 MG tablet  Commonly known as:  ASCORBIC ACID  Take 1,000 mg by mouth at bedtime.     vitamin E 400 UNIT capsule  Take 400 Units by mouth at bedtime.        Patients skin is clean, dry and intact, no evidence of skin break down. IV site discontinued and catheter remains intact. Site without signs and symptoms of complications. Dressing and pressure applied.  Patient escorted to car by Lucendia HerrlichFaye, NT in a wheelchair,  no distress noted upon discharge.  [name], [name] 03/01/2014 4:10 PM

## 2014-03-01 NOTE — Progress Notes (Addendum)
Pt called out and said that he felt like his sugar had dropped. Pt was diaphoretic and shaky. Sugar was 72. I went ahead and started the hypoglycemia protocol even though his sugar hadn't reached 70 yet because pt is NPO and was symptomatic. Pushed 25ml of D50. Will continue to monitor and recheck sugar in 15 minutes.   Rechecked sugar and it had come back up to 135. Will continue to monitor.

## 2014-03-01 NOTE — Progress Notes (Deleted)
SATURATION QUALIFICATIONS: (This note is used to comply with regulatory documentation for home oxygen)  Patient Saturations on Room Air at Rest = 96%  Patient Saturations on Room Air while Ambulating = 91%  Please briefly explain why patient needs home oxygen: Patient became very short of breath after walking about 30 feet down the hall way.

## 2014-03-01 NOTE — Progress Notes (Signed)
UR chart review completed.  

## 2014-03-01 NOTE — Progress Notes (Signed)
Patient ID: Timothy Trujillo, male   DOB: January 29, 1951, 63 y.o.   MRN: [id number]     Subjective:   No chest pain overnight.    Objective:   Temp:  [97.8 F (36.6 C)-98.2 F (36.8 C)] 98.2 F (36.8 C) (11/24 0540) Pulse Rate:  [85-101] 85 (11/24 0540) Resp:  [20] 20 (11/24 0540) BP: (109-118)/(48-72) 109/48 mmHg (11/24 0540) SpO2:  [93 %-98 %] 96 % (11/24 0837) Last BM Date: 02/25/14  Filed Weights   02/26/14 0233  Weight: 240 lb 9.6 oz (109.135 kg)    Intake/Output Summary (Last 24 hours) at 03/01/14 0843 Last data filed at 03/01/14 0541  Gross per 24 hour  Intake    483 ml  Output    500 ml  Net    -17 ml    Exam:  General: NAD  Resp: CTAB  Cardiac: RRR, no m/r/g, no JVD, no carotid bruits  GI: abdomen soft, NT, ND  MSK: no LE edema  Neuro: no focal deficits  Psych: appropriate affect  Lab Results:  Basic Metabolic Panel:  Recent Labs Lab 02/25/14 2200 02/26/14 0312 02/26/14 0843  NA 135*  --  137  K 4.3  --  3.9  CL 93*  --  95*  CO2 29  --  28  GLUCOSE 418*  --  313*  BUN 18  --  15  CREATININE 0.97 0.99 0.97  CALCIUM 9.7  --  9.6    Liver Function Tests:  Recent Labs Lab 02/25/14 2200 02/26/14 0843  AST 11 10  ALT 11 11  ALKPHOS 90 77  BILITOT 0.3 0.5  PROT 6.8 7.0  ALBUMIN 3.6 3.8    CBC:  Recent Labs Lab 02/25/14 2200 02/26/14 0312 02/26/14 0843  WBC 8.3 6.3 7.5  HGB 13.5 12.7* 14.1  HCT 38.5* 36.4* 40.5  MCV 86.9 86.7 86.9  PLT 230 188 208    Cardiac Enzymes:  Recent Labs Lab 02/27/14 0201 02/27/14 0831 02/27/14 1417  TROPONINI <0.30 <0.30 <0.30    BNP:  Recent Labs  04/19/13 2033 07/30/13 1523 02/25/14 2200  PROBNP 1316.0* 604.2* 1014.0*    Coagulation: No results for input(s): INR in the last 168 hours.  ECG:   Medications:   Scheduled Medications: . antiseptic oral rinse  7 mL Mouth Rinse BID  . aspirin EC  81 mg Oral Daily  . atorvastatin  80 mg Oral Daily  . carvedilol  6.25 mg Oral  BID WC  . digoxin  0.25 mg Oral Daily  . enoxaparin (LOVENOX) injection  40 mg Subcutaneous Q24H  . furosemide  80 mg Oral Daily  . insulin aspart  0-9 Units Subcutaneous TID WC  . insulin NPH Human  90 Units Subcutaneous BID AC & HS  . isosorbide mononitrate  15 mg Oral Daily  . lisinopril  2.5 mg Oral Daily  . potassium chloride SA  20 mEq Oral Daily  . ranolazine  500 mg Oral BID  . sodium chloride  3 mL Intravenous Q12H     Infusions:     PRN Medications:  sodium chloride, cyclobenzaprine, HYDROcodone-acetaminophen, HYDROmorphone (DILAUDID) injection, nitroGLYCERIN, ondansetron **OR** ondansetron (ZOFRAN) IV, sodium chloride, technetium sestamibi generic     Assessment/Plan   1. Chest pain - history of CAD and ICM as described in detail above. Last cath 01/2013 with diffuse disease but no revasc targets at that time. Noted moderate disease of LM and LAD at that time that could have potentially progressed. Will obtain Abbott LaboratoriesLexiscan  MPI to further evaluate and risk stratify potential new territories of ischemia.  - medical therapy limited by orthostatic symptoms, yesterday started on ranexa.   - plan for Lexiscan today, futher management pending results.    2. Orthostatic syncope - likely related to poor oral hydration, diuretics, and CHF meds - will decrease imdur, change lasix to 80mg  qday with instructions to weigh daily and take bid if increased weight. - primarily drinks caffeinated beverages, counseled on there diuretic effects and encouraged to drink more water.  - orthostatics yesterday borderline, continue to follow with decreased inmdur        Dina RichJonathan Branch, M.D.

## 2014-03-01 NOTE — Progress Notes (Signed)
Stress Lab Nurses Notes - Timothy Trujillo  Timothy Trujillo 03/01/2014 Reason for doing test: CAD and Chest Pain Type of test: [facility] HatcherLexiscan Cardiolite / Inpatient Rm 338 Nurse performing test: Timothy PoissonPhyllis Billingsly, RN Nuclear Medicine Tech: Timothy Trujillo Echo Tech: Not Applicable MD performing test: Timothy RiggsBranch Family MD: Timothy Trujillo Test explained and consent signed: Yes.   IV started: Saline lock flushed, No redness or edema and Saline lock from floor Symptoms:prior to test lightheaded, during test had nausea & vomited. Treatment/Intervention: None Reason test stopped: protocol completed After recovery IV was: No redness or edema and Saline Lock flushed Patient to return to Nuc. Med at :9:45 Patient discharged: Transported back to room 338 via wc Patient's Condition upon discharge was: stable Comments: During test BP 130/68 & HR 117 .  Recovery BP 102/46 & HR 104.  Continues to be lightheaded, nausea resolved.  Timothy Trujillo, Timothy Trujillo

## 2014-03-01 NOTE — Progress Notes (Signed)
Patient ID: Timothy Trujillo, male   DOB: 11-Aug-1950, 63 y.o.   MRN: [id number]  Lexiscan MPI reviewed, low LVEF with evidence of inferior wall infarct with very minimal peri-infarct ischemia. Findings consistent with known native RCA and LCX disease, with known occluded grafts to both the RVA and OM. No evidence of any new ischemic territories. Continue medical therapy, patient may follow up with NP Lawrence in 2-3 weeks. Ok for discharge from cardiac standpoint.   Dominga FerryJ Zavia Pullen MD

## 2014-03-01 NOTE — Progress Notes (Signed)
His blood sugar has been somewhat low. I had decreased his insulin yesterday but will need to be decreased further. He is off doing his stress test at this time. Depending on the results of that he may be able to be discharged.

## 2014-03-01 NOTE — Care Management Note (Signed)
    Page 1 of 1   03/01/2014     12:34:18 PM CARE MANAGEMENT NOTE 03/01/2014  Patient:  Timothy Trujillo,Timothy Trujillo   Account Number:  [id number]  Date Initiated:  03/01/2014  Documentation initiated by:  Sharrie RothmanBLACKWELL,Keionte Swicegood C  Subjective/Objective Assessment:   Pt admitted from home with CP. Pt lives alone and will return home at discharge. Pt has children that are very active in the care of the pt. Pt has a walker and w/c  for home and also has home O2 with [employer].     Action/Plan:   Pt refuses any home health at this time. Pt made aware that if he changes his mind to call his PCP. Pt should d/c today.   Anticipated DC Date:  03/01/2014   Anticipated DC Plan:  HOME/SELF CARE      DC Planning Services  CM consult      Choice offered to / List presented to:             Status of service:  Completed, signed off Medicare Important Message given?  YES (If response is "NO", the following Medicare IM given date fields will be blank) Date Medicare IM given:  03/01/2014 Medicare IM given by:  Sharrie RothmanBLACKWELL,Millena Callins C Date Additional Medicare IM given:   Additional Medicare IM given by:    Discharge Disposition:  HOME/SELF CARE  Per UR Regulation:    If discussed at Long Length of Stay Meetings, dates discussed:    Comments:  03/01/14 1230 Arlyss Queenammy Joslyne Marshburn, RN BSN CM

## 2014-03-02 NOTE — Discharge Summary (Signed)
Physician Discharge Summary  Patient ID: Timothy Trujillo MRN: [id number] DOB/AGE: [birth year] 65 y.o. Primary Care Physician:Timothy Zweig L, MD Admit date: 02/25/2014 Discharge date: 03/02/2014    Discharge Diagnoses:   Active Problems:   Diabetes mellitus   Hyperlipidemia   Ischemic cardiomyopathy   COPD, severe  O2 dependent   Rotator cuff syndrome of left shoulder   Chest pain   DNR no code (do not resuscitate)   Chest pain, rule out acute myocardial infarction     Medication List    STOP taking these medications        aspirin EC 81 MG tablet     cyclobenzaprine 5 MG tablet  Commonly known as:  FLEXERIL     lisinopril 2.5 MG tablet  Commonly known as:  PRINIVIL,ZESTRIL      TAKE these medications        atorvastatin 80 MG tablet  Commonly known as:  LIPITOR  Take 1 tablet (80 mg total) by mouth daily.     BC FAST PAIN RELIEF ARTHRITIS 742-222-38 MG Pack  Generic drug:  Aspirin-Salicylamide-Caffeine  Take 1 packet by mouth 3 (three) times daily as needed (for pain).     carvedilol 6.25 MG tablet  Commonly known as:  COREG  Take 1 tablet (6.25 mg total) by mouth 2 (two) times daily.     digoxin 0.25 MG tablet  Commonly known as:  LANOXIN  Take 1 tablet (0.25 mg total) by mouth daily.     furosemide 80 MG tablet  Commonly known as:  LASIX  Take 80 mg by mouth 2 (two) times daily.     HYDROcodone-acetaminophen 7.5-325 MG per tablet  Commonly known as:  NORCO  Take 1 tablet by mouth every 6 (six) hours as needed for moderate pain (pain in leg and shoulder).     insulin NPH Human 100 UNIT/ML injection  Commonly known as:  HUMULIN N,NOVOLIN N  Inject 0.75 mLs (75 Units total) into the skin 2 (two) times daily.     isosorbide mononitrate 15 mg Tb24 24 hr tablet  Commonly known as:  IMDUR  Take 0.5 tablets (15 mg total) by mouth daily.     KLOR-CON M20 20 MEQ tablet  Generic drug:  potassium chloride SA  Take 20 mEq by mouth daily.      nitroGLYCERIN 0.4 MG SL tablet  Commonly known as:  NITROSTAT  Place 0.4 mg under the tongue every 5 (five) minutes as needed for chest pain.     oxyCODONE-acetaminophen 5-325 MG per tablet  Commonly known as:  PERCOCET/ROXICET  Take 1 tablet by mouth 4 (four) times daily as needed for moderate pain or severe pain (for knee and shoulder).     ranolazine 500 MG 12 hr tablet  Commonly known as:  RANEXA  Take 1 tablet (500 mg total) by mouth 2 (two) times daily.     vitamin B-12 1000 MCG tablet  Commonly known as:  CYANOCOBALAMIN  Take 1,000 mcg by mouth daily.     vitamin C 500 MG tablet  Commonly known as:  ASCORBIC ACID  Take 1,000 mg by mouth at bedtime.     vitamin E 400 UNIT capsule  Take 400 Units by mouth at bedtime.        Discharged Condition: Improved    Consults: Cardiology  Significant Diagnostic Studies: Nm Myocar Multi W/spect W/wall Motion / Ef  03/01/2014   CLINICAL DATA:  63 year old male with a known history of coronary artery disease referred  for chest pain.  EXAM: MYOCARDIAL IMAGING WITH SPECT (REST AND PHARMACOLOGIC-STRESS)  GATED LEFT VENTRICULAR WALL MOTION STUDY  LEFT VENTRICULAR EJECTION FRACTION  TECHNIQUE: Standard myocardial SPECT imaging was performed after resting intravenous injection of 10 mCi Tc-7679m sestamibi. Subsequently, intravenous infusion of Lexiscan was performed under the supervision of the Cardiology staff. At peak effect of the drug, 30 mCi Tc-4679m sestamibi was injected intravenously and standard myocardial SPECT imaging was performed. Quantitative gated imaging was also performed to evaluate left ventricular wall motion, and estimate left ventricular ejection fraction.  COMPARISON:  None.  FINDINGS: Pharmacological stress  Baseline EKG showed sinus rhythm with T-wave inversion is a in the lateral limb leads. Post-injection heart rate increased from 88 beats per min up to 118 beats per min, and resting blood pressure increased from 114/62 up  to 130/68. The test was stopped after injection was completed. The patient did not experience any chest pain. Post-injection EKG showed chronic ST/T changes, no specific ischemic changes and no significant arrhythmias.  Perfusion: There is a moderate-sized moderate intensity inferior wall defect with fairly minimal peri-infarct ischemia.  Wall Motion: There is global hypokinesis.  Left Ventricular Ejection Fraction: 18 %  End diastolic volume 122 ml  End systolic volume 100 ml  IMPRESSION: 1. Moderate inferior wall infarct with fairly minimal peri-infarct ischemia.  2. Decreased left ventricular ejection fraction.  3. Left ventricular ejection fraction 18%  4. High-risk stress test findings*. High risk findings due to low ejection fraction. There is inferior scar with fairly minimal myocardium at current jeopardy.  *2012 Appropriate Use Criteria for Coronary Revascularization Focused Update: J Am Coll Cardiol. 2012;59(9):857-881. http://content.dementiazones.comonlinejacc.org/article.aspx?articleid=1201161   Electronically Signed   By: Timothy RichJonathan  Trujillo   On: 03/01/2014 11:49   Dg Chest Portable 1 View  02/25/2014   CLINICAL DATA:  Mid to left-sided chest pain, radiating to the left shoulder, neck, and jaw. Shortness of breath today.  EXAM: PORTABLE CHEST - 1 VIEW  COMPARISON:  12/14/2013  FINDINGS: Postoperative changes in the mediastinum. Shallow inspiration. Cardiac enlargement. Pulmonary vascularity appears normal. Diffuse interstitial pattern to the lungs consistent with chronic infiltration or fibrosis and unchanged since prior studies. No focal consolidation. No blunting of costophrenic angles. No pneumothorax. Calcification of the aorta.  IMPRESSION: Cardiac enlargement. Unchanged appearance of chronic interstitial lung disease.   Electronically Signed   By: Timothy NievesWilliam  Trujillo M.D.   On: 02/25/2014 22:55    Lab Results: Basic Metabolic Panel: No results for input(s): NA, K, CL, CO2, GLUCOSE, BUN, CREATININE, CALCIUM,  MG, PHOS in the last 72 hours. Liver Function Tests: No results for input(s): AST, ALT, ALKPHOS, BILITOT, PROT, ALBUMIN in the last 72 hours.   CBC: No results for input(s): WBC, NEUTROABS, HGB, HCT, MCV, PLT in the last 72 hours.  No results found for this or any previous visit (from the past 240 hour(s)).   Hospital Course: This is a 63 year old has multiple medical problems including a known history of coronary occlusive disease with ischemic cardiomyopathy and a history of silicosis. He was in his usual state of poor health at home when he developed increasing problems with chest pain. He came to the emergency department and was treated but because of his history he was brought in for rule out MI protocol. He did rule out but continued to have some chest discomfort that required nitroglycerin several times. Cardiology consultation was obtained and it was felt that he should undergo nuclear stress testing which he did and it did not show  any significant change from previous testing. He had adjustment in his medications and by the time of discharge she was pain-free and back at baseline. He did have some trouble with low blood sugars during his hospitalization and I adjusted his insulin I think it's because he follow his diet more closely here in the hospital than he does at home.  Discharge Exam: Blood pressure 109/48, pulse 92, temperature 98.2 F (36.8 C), temperature source Oral, resp. rate 20, height 6\' 3"  (1.905 m), weight 109.135 kg (240 lb 9.6 oz), SpO2 96 %. He is awake and alert. He is in no distress. His chest is clear. His heart is regular  Disposition: Home he initially said he wanted home health services he later refused that      Discharge Instructions    Discharge patient    Complete by:  As directed      Face-to-face encounter (required for Medicare/Medicaid patients)    Complete by:  As directed   I Courtnee Myer Trujillo certify that this patient is under my care and that I,  or a nurse practitioner or physician's assistant working with me, had a face-to-face encounter that meets the physician face-to-face encounter requirements with this patient on 02/28/2014. The encounter with the patient was in whole, or in part for the following medical condition(s) which is the primary reason for home health care (List medical condition): Chest pain/ischemic cardiomyopathy  The encounter with the patient was in whole, or in part, for the following medical condition, which is the primary reason for home health care:  Chest pain/ischemic cardiomyopathy  I certify that, based on my findings, the following services are medically necessary home health services:   NursingPhysical therapy    Reason for Medically Necessary Home Health Services:  Skilled Nursing- Change/Decline in Patient Status  My clinical findings support the need for the above services:  Shortness of breath with activity  Further, I certify that my clinical findings support that this patient is homebound due to:  Shortness of Breath with activity     Home Health    Complete by:  As directed   To provide the following care/treatments:   PTRN               Signed: Kiarra Kidd Trujillo   03/02/2014, 8:04 AM

## 2014-03-10 ENCOUNTER — Other Ambulatory Visit: Payer: Self-pay | Admitting: Physician Assistant

## 2014-03-11 ENCOUNTER — Other Ambulatory Visit: Payer: Self-pay

## 2014-03-11 MED ORDER — ATORVASTATIN CALCIUM 80 MG PO TABS: 80.0000 mg | ORAL_TABLET | Freq: Every day | ORAL | Status: DC

## 2014-03-11 NOTE — Telephone Encounter (Signed)
Refill for atorvastatin increased to 80 mg daily per MD OV note e-scribed to pharmacy

## 2014-03-17 ENCOUNTER — Encounter (HOSPITAL_COMMUNITY): Payer: Self-pay | Admitting: Cardiovascular Disease

## 2014-03-21 ENCOUNTER — Encounter: Payer: Self-pay | Admitting: Adult Health

## 2014-03-21 ENCOUNTER — Ambulatory Visit (INDEPENDENT_AMBULATORY_CARE_PROVIDER_SITE_OTHER): Payer: Commercial Managed Care - HMO | Admitting: Adult Health

## 2014-03-21 VITALS — BP 86/52 | HR 90 | Ht 75.0 in | Wt 242.0 lb

## 2014-03-21 DIAGNOSIS — I471 Supraventricular tachycardia: Secondary | ICD-10-CM

## 2014-03-21 DIAGNOSIS — R000 Tachycardia, unspecified: Secondary | ICD-10-CM

## 2014-03-21 DIAGNOSIS — E860 Dehydration: Secondary | ICD-10-CM

## 2014-03-21 DIAGNOSIS — I519 Heart disease, unspecified: Secondary | ICD-10-CM

## 2014-03-21 NOTE — Progress Notes (Signed)
HPI: is a 63 year old patient of Dr. Christiane HaJonathan branch that we follow for ongoing assessment and management of coronary artery disease, history of CABG, most recent cardiac catheterization in October 2014, revealing left main 60% LAD 60% mid, left circumflex, proximal patent stent and distal occluded circumflex, RCA occluded, SVG to RCA occluded, SVG to OM occluded. Echocardiogram revealed LVEF of 15-20%. Other history includes hypertension, hyperlipidemia. He was last seen by Dr. Wyline MoodBranch in September of 2015. He is a DO NOT RESUSCITATE.  On last visit. Discussion for ICD implantation was had with the patient, who refused, he has chronic New York Heart Association II-IV dyspnea. His medical therapy has been limited due to medication compliance and low blood pressures. His Lasix was increased 220 mg in a.m. And 80 mg in the p.m. For 3 days and then back to 80 mg twice a day.  [facility] the setting of recurrent chest pain, ruled out for myocardial infarction. During that hospitalization, he had a nuclear medicine stress test on 11 24 2015. This revealed EF of 18%, moderate inferior wall infarct, was fairly minimal. Infarct ischemia with etiology of low ejection fraction. There is inferior scar was fairly minimal myocardium and current jeopardy. He was continued on medical management.  He comes today with complaints of dizziness, especially when he stands up. He also has multiple complaints of musculoskeletal pain/joint pain.  Allergies  Allergen Reactions  . Baycol [Cerivastatin]   . Stadol [Butorphanol Tartrate] Other (See Comments)    "makes him crazy"    Current Outpatient Prescriptions  Medication Sig Dispense Refill  . Aspirin-Salicylamide-Caffeine (BC FAST PAIN RELIEF ARTHRITIS) [phone] MG PACK Take 1 packet by mouth 3 (three) times daily as needed (for pain).    [name]. atorvastatin (LIPITOR) 80 MG tablet Take 1 tablet (80 mg total) by mouth daily. 90  tablet 3  . carvedilol (COREG) 6.25 MG tablet Take 1 tablet (6.25 mg total) by mouth 2 (two) times daily. 180 tablet 3  . clopidogrel (PLAVIX) 75 MG tablet Take 75 mg by mouth daily.     . digoxin (LANOXIN) 0.25 MG tablet Take 1 tablet (0.25 mg total) by mouth daily. 30 tablet 12  . furosemide (LASIX) 80 MG tablet Take 80 mg by mouth 2 (two) times daily.    . insulin NPH Human (HUMULIN N,NOVOLIN N) 100 UNIT/ML injection Inject 0.75 mLs (75 Units total) into the skin 2 (two) times daily. 10 mL 11  . isosorbide dinitrate (ISORDIL) 20 MG tablet Take 20 mg by mouth 3 (three) times daily.    . isosorbide mononitrate (IMDUR) 30 MG 24 hr tablet Take 30 mg by mouth daily.    [name]. KLOR-CON M20 20 MEQ tablet Take 20 mEq by mouth daily.     [name]. lisinopril (PRINIVIL,ZESTRIL) 2.5 MG tablet TAKE ONE TABLET BY MOUTH ONCE DAILY 30 tablet 6  . nitroGLYCERIN (NITROSTAT) 0.4 MG SL tablet Place 0.4 mg under the tongue every 5 (five) minutes as needed for chest pain.     [name]. oxyCODONE-acetaminophen (PERCOCET) 10-325 MG per tablet Take 1 tablet by mouth every 4 (four) hours as needed for pain.    . ranolazine (RANEXA) 500 MG 12 hr tablet Take 1 tablet (500 mg total) by mouth 2 (two) times daily. 60 tablet 12  . sulfamethoxazole-trimethoprim (BACTRIM DS,SEPTRA DS) 800-160 MG per tablet Take 1 tablet by mouth 2 (two) times daily.    . vitamin B-12 (CYANOCOBALAMIN) 1000 MCG tablet Take  1,000 mcg by mouth daily.    . vitamin C (ASCORBIC ACID) 500 MG tablet Take 1,000 mg by mouth at bedtime.    . vitamin E 400 UNIT capsule Take 400 Units by mouth at bedtime.      No current facility-administered medications for this visit.    Past Medical History  Diagnosis Date  . Uncontrolled diabetes mellitus     a. A1C 12.8 in 01/2013.  [name]. Chronic systolic heart failure   . Obesity   . Hyperlipidemia   . Peripheral vascular disease   . Hypertension   . Asthma   . CAD (coronary artery disease)     a. Prior CABG 20 yrs ago (?~1994).  b. Hx of multiple stents, [place]: cath moderate disease in LAD, distal LCx, prox RCA occluded, 2 SVGs occluded, felt to be stable from prior cath -> for medical therapy.  . Ischemic cardiomyopathy     a. EF 2012: 55-60. b. EF 12/2011: 25-30%. c. Echo 01/2013: EF 15-20%.  . Stroke     15 years ago  . Arthritis   . Anxiety   . Depression   . 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 Chronic respiratory failure     a. Due to COPD.  . Osteomyelitis 2013    was planned for chronic suppressive antibiotics but cannot afford this any longer  . DNR no code (do not resuscitate) 07/2013  . CHF (congestive heart failure)   . Diabetes mellitus without complication     Past Surgical History  Procedure Laterality Date  . Coronary artery bypass graft    . Coronary stent placement  2008    CABG grafts closed  . Joint replacement    . Cholecystectomy    . Eye surgery    . I&d extremity  06/12/2011    Procedure: IRRIGATION AND DEBRIDEMENT EXTREMITY;  Surgeon: Javier DockerJeffrey C Beane, MD;  Location: [facility] OR;  Service: Orthopedics;  Laterality: Right;  I&D Right [place]  . Left and right heart catheterization with coronary/graft angiogram N/A 01/15/2013    Procedure: LEFT AND RIGHT HEART CATHETERIZATION WITH Isabel CapriceORONARY/GRAFT ANGIOGRAM;  Surgeon: Alvia GroveJr Philip J Nahser, MD;  [facility];  Service: Cardiovascular;  Laterality: N/A;    ZOX:[id number]:Complete review of systems performed and found to be negative unless outlined above  PHYSICAL EXAM BP 86/52 mmHg  Pulse 90  Ht 6\' 3"  (1.905 m)  Wt 242 lb (109.77 kg)  BMI 30.25 kg/m2  SpO2 90%  General: Well developed, well nourished, in no acute distress Head: Eyes PERRLA, No xanthomas.   Normal cephalic and atramatic  Lungs: Clear bilaterally to auscultation and percussion. Heart: HRRR S1 S2, without MRG.  Pulses are 2+ & equal.            No carotid bruit. No JVD.  No abdominal bruits.  No femoral bruits. Abdomen: Bowel sounds are positive, abdomen soft and non-tender without masses or                  Hernia's noted. Msk:  Back normal, normal gait. Normal strength and tone for age. Extremities: No clubbing, cyanosis or edema.  DP +1 Neuro: Alert and oriented X 3. Psych:  Good affect, responds appropriately  ASSESSMENT AND PLAN

## 2014-03-21 NOTE — Patient Instructions (Signed)
Your physician recommends that you schedule a follow-up appointment in: 1 month  Your physician [facility]  Your physician recommends that you continue on your current medications as directed. Please refer to the Current Medication list given to you today.  PLEASE ONLY TAKE ISOSORBIDE 30 MG DAILY  Thank you for choosing [facility]!!

## 2014-03-21 NOTE — Assessment & Plan Note (Signed)
Consider [place] if HR remains elevated.

## 2014-03-21 NOTE — Progress Notes (Deleted)
Name: Timothy Trujillo    DOB: 05/09/1950  Age: 63 y.o.  MR#: [id number]       PCP:  Fredirick MaudlinHAWKINS,EDWARD L, MD      Insurance: Payor: [employer] / Plan: [employer] HMO / Product Type: *No Product type* /   CC:    Chief Complaint  Patient presents with  . Coronary Artery Disease  . Cardiomyopathy  . Hyperlipidemia  . Hypertension    VS Filed Vitals:   03/21/14 1339  BP: 86/52  Pulse: 90  Height: 6\' 3"  (1.905 m)  Weight: 242 lb (109.77 kg)  SpO2: 90%    Weights Current Weight  03/21/14 242 lb (109.77 kg)  02/26/14 240 lb 9.6 oz (109.135 kg)  12/14/13 262 lb (118.842 kg)    Blood Pressure  BP Readings from Last 3 Encounters:  03/21/14 86/52  03/01/14 109/48  12/14/13 110/60     Admit date:  (Not on file) Last encounter with RMR:  Visit date not found   Allergy Baycol and Stadol  Current Outpatient Prescriptions  Medication Sig Dispense Refill  . Aspirin-Salicylamide-Caffeine (BC FAST PAIN RELIEF ARTHRITIS) [phone] MG PACK Take 1 packet by mouth 3 (three) times daily as needed (for pain).    [name]. atorvastatin (LIPITOR) 80 MG tablet Take 1 tablet (80 mg total) by mouth daily. 90 tablet 3  . carvedilol (COREG) 6.25 MG tablet Take 1 tablet (6.25 mg total) by mouth 2 (two) times daily. 180 tablet 3  . clopidogrel (PLAVIX) 75 MG tablet Take 75 mg by mouth daily.     . digoxin (LANOXIN) 0.25 MG tablet Take 1 tablet (0.25 mg total) by mouth daily. 30 tablet 12  . furosemide (LASIX) 80 MG tablet Take 80 mg by mouth 2 (two) times daily.    . insulin NPH Human (HUMULIN N,NOVOLIN N) 100 UNIT/ML injection Inject 0.75 mLs (75 Units total) into the skin 2 (two) times daily. 10 mL 11  . isosorbide dinitrate (ISORDIL) 20 MG tablet Take 20 mg by mouth 3 (three) times daily.    . isosorbide mononitrate (IMDUR) 15 mg TB24 24 hr tablet Take 0.5 tablets (15 mg total) by mouth daily. 30 tablet 12  . isosorbide mononitrate (IMDUR) 30 MG 24 hr tablet Take 30 mg by mouth daily.    [name]. KLOR-CON  M20 20 MEQ tablet Take 20 mEq by mouth daily.     [name]. lisinopril (PRINIVIL,ZESTRIL) 2.5 MG tablet TAKE ONE TABLET BY MOUTH ONCE DAILY 30 tablet 6  . nitroGLYCERIN (NITROSTAT) 0.4 MG SL tablet Place 0.4 mg under the tongue every 5 (five) minutes as needed for chest pain.     [name]. oxyCODONE-acetaminophen (PERCOCET) 10-325 MG per tablet Take 1 tablet by mouth every 4 (four) hours as needed for pain.    . ranolazine (RANEXA) 500 MG 12 hr tablet Take 1 tablet (500 mg total) by mouth 2 (two) times daily. 60 tablet 12  . sulfamethoxazole-trimethoprim (BACTRIM DS,SEPTRA DS) 800-160 MG per tablet Take 1 tablet by mouth 2 (two) times daily.    . vitamin B-12 (CYANOCOBALAMIN) 1000 MCG tablet Take 1,000 mcg by mouth daily.    . vitamin C (ASCORBIC ACID) 500 MG tablet Take 1,000 mg by mouth at bedtime.    . vitamin E 400 UNIT capsule Take 400 Units by mouth at bedtime.      No current facility-administered medications for this visit.    Discontinued Meds:    Medications Discontinued During This Encounter  Medication Reason  . isosorbide  dinitrate (ISORDIL) 20 MG tablet Error  . oxyCODONE-acetaminophen (PERCOCET/ROXICET) 5-325 MG per tablet Error  . HYDROcodone-acetaminophen (NORCO) 7.5-325 MG per tablet Error    Patient Active Problem List   Diagnosis Date Noted  . Chest pain, rule out acute myocardial infarction 02/26/2014  . Secondary pulmonary hypertension 07/31/2013  . Aortic atherosclerosis 07/31/2013  . Unstable angina 07/30/2013  . Chronic systolic dysfunction of left ventricle 04/29/2013  . DNR no code (do not resuscitate) 04/29/2013  . Chest pain 04/19/2013  . Rotator cuff syndrome of left shoulder 02/24/2013  . Ischemic cardiomyopathy 01/16/2013  . Sinus tachycardia 01/16/2013  . COPD, severe  O2 dependent 01/16/2013  . Cerebrovascular disease 01/08/2013  . Diabetes mellitus 06/12/2011  . Hypertension 06/12/2011  . Hyperlipidemia 06/12/2011  . Silicosis 06/12/2011  . Osteomyelitis of  knee region 06/12/2011  . Cerebral artery occlusion   . Obesity   . Peripheral vascular disease     LABS    Component Value Date/Time   NA 137 02/26/2014 0843   NA 135* 02/25/2014 2200   NA 137 07/31/2013 0430   K 3.9 02/26/2014 0843   K 4.3 02/25/2014 2200   K 4.2 07/31/2013 0430   CL 95* 02/26/2014 0843   CL 93* 02/25/2014 2200   CL 97 07/31/2013 0430   CO2 28 02/26/2014 0843   CO2 29 02/25/2014 2200   CO2 24 07/31/2013 0430   GLUCOSE 313* 02/26/2014 0843   GLUCOSE 418* 02/25/2014 2200   GLUCOSE 324* 07/31/2013 0430   BUN 15 02/26/2014 0843   BUN 18 02/25/2014 2200   BUN 17 07/31/2013 0430   CREATININE 0.97 02/26/2014 0843   CREATININE 0.99 02/26/2014 0312   CREATININE 0.97 02/25/2014 2200   CREATININE 1.89* 07/05/2013 1625   CREATININE 1.29 07/19/2011 1207   CALCIUM 9.6 02/26/2014 0843   CALCIUM 9.7 02/25/2014 2200   CALCIUM 9.9 07/31/2013 0430   GFRNONAA 85* 02/26/2014 0843   GFRNONAA 84* 02/26/2014 0312   GFRNONAA 85* 02/25/2014 2200   GFRNONAA 59* 07/19/2011 1207   GFRAA >90 02/26/2014 0843   GFRAA >90 02/26/2014 0312   GFRAA >90 02/25/2014 2200   GFRAA 68 07/19/2011 1207   CMP     Component Value Date/Time   NA 137 02/26/2014 0843   K 3.9 02/26/2014 0843   CL 95* 02/26/2014 0843   CO2 28 02/26/2014 0843   GLUCOSE 313* 02/26/2014 0843   BUN 15 02/26/2014 0843   CREATININE 0.97 02/26/2014 0843   CREATININE 1.89* 07/05/2013 1625   CALCIUM 9.6 02/26/2014 0843   PROT 7.0 02/26/2014 0843   ALBUMIN 3.8 02/26/2014 0843   AST 10 02/26/2014 0843   ALT 11 02/26/2014 0843   ALKPHOS 77 02/26/2014 0843   BILITOT 0.5 02/26/2014 0843   GFRNONAA 85* 02/26/2014 0843   GFRNONAA 59* 07/19/2011 1207   GFRAA >90 02/26/2014 0843   GFRAA 68 07/19/2011 1207       Component Value Date/Time   WBC 7.5 02/26/2014 0843   WBC 6.3 02/26/2014 0312   WBC 8.3 02/25/2014 2200   HGB 14.1 02/26/2014 0843   HGB 12.7* 02/26/2014 0312   HGB 13.5 02/25/2014 2200   HCT 40.5  02/26/2014 0843   HCT 36.4* 02/26/2014 0312   HCT 38.5* 02/25/2014 2200   MCV 86.9 02/26/2014 0843   MCV 86.7 02/26/2014 0312   MCV 86.9 02/25/2014 2200    Lipid Panel     Component Value Date/Time   CHOL 245* 01/15/2013 0800   TRIG 340*  01/15/2013 0800   HDL 22* 01/15/2013 0800   CHOLHDL 11.1 01/15/2013 0800   VLDL 68* 01/15/2013 0800   LDLCALC 155* 01/15/2013 0800    ABG    Component Value Date/Time   PHART 7.336* 01/15/2013 1400   PCO2ART 45.6* 01/15/2013 1400   PO2ART 81.0 01/15/2013 1400   HCO3 24.4* 01/15/2013 1400   TCO2 26 01/15/2013 1400   ACIDBASEDEF 2.0 01/15/2013 1400   O2SAT 95.0 01/15/2013 1400     Lab Results  Component Value Date   TSH 1.460 07/30/2013   BNP (last 3 results)  Recent Labs  04/19/13 2033 07/30/13 1523 02/25/14 2200  PROBNP 1316.0* 604.2* 1014.0*   Cardiac Panel (last 3 results) No results for input(s): CKTOTAL, CKMB, TROPONINI, RELINDX in the last 72 hours.  Iron/TIBC/Ferritin/ %Sat No results found for: IRON, TIBC, FERRITIN, IRONPCTSAT   EKG Orders placed or performed during the hospital encounter of 02/25/14  . ED EKG  . ED EKG  . EKG 12-Lead  . EKG 12-Lead  . EKG 12-Lead  . EKG 12-Lead  . EKG - 12 lead  . EKG - 12 lead  . EKG     Prior Assessment and Plan Problem List as of 03/21/2014      Cardiovascular and Mediastinum   Cerebral artery occlusion   Peripheral vascular disease   Hypertension   Last Assessment & Plan   09/10/2013 Office Visit Written 09/10/2013  1:54 PM by Jodelle Gross, NP    Low normal for patient with EF of 15%. He will continue current m lisinopril, digoxin, and isosorbide. Along with diuretics.    Cerebrovascular disease   Ischemic cardiomyopathy   Last Assessment & Plan   09/10/2013 Office Visit Written 09/10/2013  1:53 PM by Jodelle Gross, NP    Not titrate his medications any further. And continue him on his current medication regimen. He is hypotensive here, and appears euvolemic.  He refuses life vest which has been offered to him. We will see him again in 3 months unless he requires sooner evaluation    Chronic systolic dysfunction of left ventricle   Last Assessment & Plan   08/10/2013 Office Visit Written 08/10/2013  2:48 PM by Jodelle Gross, NP    I am uncertain if he is taking 6.25 mg twice a day vs. 3.125 mg twice a day as he has one bottle of medication that he picked up post hospitalization but is only taking one pill twice a day. He does not know if it is higher dose or not. His grandson who is with him has very little understanding concerning his medication regimen. The patient is also confused on his dig ox and medication. He states the medicine that "begins with the D.".  I will have [facility] healthcare network  [facility]) contact him, to have Ms. Hayden Pedro RN come by to see him go over his medications and make recommendations concerning his needs at home. I am concerned that a 1 year old grandson is his main caretaker, with very little understanding about his health status or medications.  He will return in one month, he will bring his medications with him, and I have reviewed all medications with him prior to him leaving. Ms. Les Pou RN has also highlighted the 2 medications he needs to be taking and their doses as he is bit confused about this.    Unstable angina   Last Assessment & Plan   09/10/2013 Office Visit Written 09/10/2013  1:52 PM by Jodelle Gross,  NP    He states that he has chest discomfort or shortness of breath when he forgets to take his medications. He states that he did forget for couple days, and nitroglycerin was used sublingual. He is being reminded daily by his grandson to take his medications when his grandson comes home from school.  I have again reinforced the need to take his medications as directed. [facility] will be in touch, we have talked with them again by phone here in the office and verified his cell phone number. He is  willing to have them come out and evaluate his current status.    Secondary pulmonary hypertension   Aortic atherosclerosis     Respiratory   Silicosis   COPD, severe  O2 dependent     Endocrine   Diabetes mellitus   Last Assessment & Plan   08/10/2013 Office Visit Written 08/10/2013  2:52 PM by Jodelle Gross, NP    He will be followed by his primary care physician Dr. Juanetta Gosling. He states it is very difficult to control. He may be having some diabetic gastroparesis with abdominal pain and burping. Referral to GI specialist her PCP is recommended at his discretion      Musculoskeletal and Integument   Osteomyelitis of knee region   Last Assessment & Plan   09/04/2011 Office Visit Edited 09/04/2011 12:01 PM by Ginnie Smart, MD    Somewhat limited with Cx (-). WIll cont his IV therapy as previously written and then transition him to bactrim. He has seen Dr Shelle Iron who also referred him to [facility]. Will see him back in 6 weeks and consider MRI at that time. He will call if he has worsening in the intervening period.    Rotator cuff syndrome of left shoulder     Other   Obesity   Last Assessment & Plan   01/26/2013 Office Visit Written 01/26/2013  1:38 PM by Jodelle Gross, NP    Very sedentary uses wheel chair for ambulation,     Hyperlipidemia   Last Assessment & Plan   01/26/2013 Office Visit Written 01/26/2013  1:37 PM by Jodelle Gross, NP    Continue statin. Follow up labs in 3 months.    Sinus tachycardia   Chest pain   Last Assessment & Plan   08/10/2013 Office Visit Written 08/10/2013  2:53 PM by Jodelle Gross, NP    Chronic for him. He is narcotic dependent.    DNR no code (do not resuscitate)   Chest pain, rule out acute myocardial infarction       Imaging: Nm Myocar Multi W/spect W/wall Motion / Ef  03/01/2014   CLINICAL DATA:  63 year old male with a known history of coronary artery disease referred for chest pain.  EXAM: MYOCARDIAL IMAGING WITH SPECT  (REST AND PHARMACOLOGIC-STRESS)  GATED LEFT VENTRICULAR WALL MOTION STUDY  LEFT VENTRICULAR EJECTION FRACTION  TECHNIQUE: Standard myocardial SPECT imaging was performed after resting intravenous injection of 10 mCi Tc-71m sestamibi. Subsequently, intravenous infusion of Lexiscan was performed under the supervision of the Cardiology staff. At peak effect of the drug, 30 mCi Tc-46m sestamibi was injected intravenously and standard myocardial SPECT imaging was performed. Quantitative gated imaging was also performed to evaluate left ventricular wall motion, and estimate left ventricular ejection fraction.  COMPARISON:  None.  FINDINGS: Pharmacological stress  Baseline EKG showed sinus rhythm with T-wave inversion is a in the lateral limb leads. Post-injection heart rate increased from 88 beats per  min up to 118 beats per min, and resting blood pressure increased from 114/62 up to 130/68. The test was stopped after injection was completed. The patient did not experience any chest pain. Post-injection EKG showed chronic ST/T changes, no specific ischemic changes and no significant arrhythmias.  Perfusion: There is a moderate-sized moderate intensity inferior wall defect with fairly minimal peri-infarct ischemia.  Wall Motion: There is global hypokinesis.  Left Ventricular Ejection Fraction: 18 %  End diastolic volume 122 ml  End systolic volume 100 ml  IMPRESSION: 1. Moderate inferior wall infarct with fairly minimal peri-infarct ischemia.  2. Decreased left ventricular ejection fraction.  3. Left ventricular ejection fraction 18%  4. High-risk stress test findings*. High risk findings due to low ejection fraction. There is inferior scar with fairly minimal myocardium at current jeopardy.  *2012 Appropriate Use Criteria for Coronary Revascularization Focused Update: J Am Coll Cardiol. 2012;59(9):857-881. http://content.dementiazones.com.aspx?articleid=[email or web address]   Electronically Signed   By: Dina Rich   On:  03/01/2014 11:49   Dg Chest Portable 1 View  02/25/2014   CLINICAL DATA:  Mid to left-sided chest pain, radiating to the left shoulder, neck, and jaw. Shortness of breath today.  EXAM: PORTABLE CHEST - 1 VIEW  COMPARISON:  12/14/2013  FINDINGS: Postoperative changes in the mediastinum. Shallow inspiration. Cardiac enlargement. Pulmonary vascularity appears normal. Diffuse interstitial pattern to the lungs consistent with chronic infiltration or fibrosis and unchanged since prior studies. No focal consolidation. No blunting of costophrenic angles. No pneumothorax. Calcification of the aorta.  IMPRESSION: Cardiac enlargement. Unchanged appearance of chronic interstitial lung disease.   Electronically Signed   By: Burman Nieves M.D.   On: 02/25/2014 22:55

## 2014-03-21 NOTE — Assessment & Plan Note (Signed)
Hypotensive. With low EF will tolerate systolic in the 90's. He may be a little dehydrated. Checking BMET.

## 2014-03-21 NOTE — Assessment & Plan Note (Signed)
He is dizzy and lightheaded, and hypotensive and has been taking 3 different doses of isosorbide. I have gone over each medication and discussed it with him. He will only take isosorbide 30 mg once a day. I am also going to take BMET. He has not taken his lasix for two days as he is very dizzy and states that he is not urinating very much.

## 2014-03-22 ENCOUNTER — Telehealth: Payer: Self-pay | Admitting: *Deleted

## 2014-03-22 NOTE — Telephone Encounter (Signed)
Received labs, in Joni ReiningKathryn Lawrence, NP folder.

## 2014-03-23 ENCOUNTER — Encounter: Payer: Self-pay | Admitting: Adult Health

## 2014-04-21 ENCOUNTER — Encounter (HOSPITAL_COMMUNITY): Payer: Self-pay | Admitting: Interventional Cardiology

## 2014-04-25 ENCOUNTER — Encounter: Payer: Self-pay | Admitting: Adult Health

## 2014-04-25 ENCOUNTER — Ambulatory Visit (INDEPENDENT_AMBULATORY_CARE_PROVIDER_SITE_OTHER): Payer: Commercial Managed Care - HMO | Admitting: Adult Health

## 2014-04-25 VITALS — BP 138/74 | HR 63 | Ht 74.0 in | Wt 232.0 lb

## 2014-04-25 DIAGNOSIS — L259 Unspecified contact dermatitis, unspecified cause: Secondary | ICD-10-CM | POA: Insufficient documentation

## 2014-04-25 MED ORDER — CARVEDILOL 6.25 MG PO TABS: 6.2500 mg | ORAL_TABLET | Freq: Two times a day (BID) | ORAL | Status: AC

## 2014-04-25 MED ORDER — ISOSORBIDE MONONITRATE ER 60 MG PO TB24: 60.0000 mg | ORAL_TABLET | Freq: Every day | ORAL | Status: AC

## 2014-04-25 MED ORDER — NITROGLYCERIN 0.4 MG SL SUBL: 0.4000 mg | SUBLINGUAL_TABLET | SUBLINGUAL | Status: AC | PRN

## 2014-04-25 MED ORDER — CLOPIDOGREL BISULFATE 75 MG PO TABS: 75.0000 mg | ORAL_TABLET | Freq: Every day | ORAL | Status: AC

## 2014-04-25 NOTE — Progress Notes (Deleted)
Name: Timothy Trujillo    DOB: 06/24/1950  Age: 64 y.o.  MR#: [id number]       PCP:  Fredirick Maudlin, MD      Insurance: Payor: [employer] / Plan: [employer] MEDICARE HMO / Product Type: *No Product type* /   CC:    Chief Complaint  Patient presents with  . Coronary Artery Disease  . Cardiomyopathy    VS Filed Vitals:   04/25/14 1525  BP: 138/74  Pulse: 63  Height:  (1.88 m)  Weight: 232 lb (105.235 kg)  SpO2: 95%    Weights Current Weight  04/25/14 232 lb (105.235 kg)  03/21/14 242 lb (109.77 kg)  02/26/14 240 lb 9.6 oz (109.135 kg)    Blood Pressure  BP Readings from Last 3 Encounters:  04/25/14 138/74  03/21/14 86/52  03/01/14 109/48     Admit date:  (Not on file) Last encounter with RMR:  03/21/2014   Allergy Baycol and Stadol  Current Outpatient Prescriptions  Medication Sig Dispense Refill  . Aspirin-Salicylamide-Caffeine (BC FAST PAIN RELIEF ARTHRITIS) [phone] MG PACK Take 1 packet by mouth 3 (three) times daily as needed (for pain).    [name] atorvastatin (LIPITOR) 80 MG tablet Take 1 tablet (80 mg total) by mouth daily. 90 tablet 3  . carvedilol (COREG) 6.25 MG tablet Take 1 tablet (6.25 mg total) by mouth 2 (two) times daily. 180 tablet 3  . clopidogrel (PLAVIX) 75 MG tablet Take 75 mg by mouth daily.     . digoxin (LANOXIN) 0.25 MG tablet Take 1 tablet (0.25 mg total) by mouth daily. 30 tablet 12  . furosemide (LASIX) 80 MG tablet Take 80 mg by mouth 2 (two) times daily.    . insulin NPH Human (HUMULIN N,NOVOLIN N) 100 UNIT/ML injection Inject 0.75 mLs (75 Units total) into the skin 2 (two) times daily. 10 mL 11  . isosorbide dinitrate (ISORDIL) 20 MG tablet Take 20 mg by mouth 3 (three) times daily.    . isosorbide mononitrate (IMDUR) 30 MG 24 hr tablet Take 30 mg by mouth daily.    [name] KLOR-CON M20 20 MEQ tablet Take 20 mEq by mouth daily.     [name] lisinopril (PRINIVIL,ZESTRIL) 2.5 MG tablet TAKE ONE TABLET BY MOUTH ONCE DAILY 30 tablet 6  . nitroGLYCERIN  (NITROSTAT) 0.4 MG SL tablet Place 1 tablet (0.4 mg total) under the tongue every 5 (five) minutes as needed for chest pain. 25 tablet 3  . oxyCODONE-acetaminophen (PERCOCET) 10-325 MG per tablet Take 1 tablet by mouth every 4 (four) hours as needed for pain.    . ranolazine (RANEXA) 500 MG 12 hr tablet Take 1 tablet (500 mg total) by mouth 2 (two) times daily. 60 tablet 12  . sulfamethoxazole-trimethoprim (BACTRIM DS,SEPTRA DS) 800-160 MG per tablet Take 1 tablet by mouth 2 (two) times daily.    . vitamin B-12 (CYANOCOBALAMIN) 1000 MCG tablet Take 1,000 mcg by mouth daily.    . vitamin C (ASCORBIC ACID) 500 MG tablet Take 1,000 mg by mouth at bedtime.    . vitamin E 400 UNIT capsule Take 400 Units by mouth at bedtime.      No current facility-administered medications for this visit.    Discontinued Meds:    Medications Discontinued During This Encounter  Medication Reason  . nitroGLYCERIN (NITROSTAT) 0.4 MG SL tablet Reorder    Patient Active Problem List   Diagnosis Date Noted  . Chest pain, rule out acute myocardial infarction 02/26/2014  .  Secondary pulmonary hypertension 07/31/2013  . Aortic atherosclerosis 07/31/2013  . Unstable angina 07/30/2013  . Chronic systolic dysfunction of left ventricle 04/29/2013  . DNR no code (do not resuscitate) 04/29/2013  . Chest pain 04/19/2013  . Rotator cuff syndrome of left shoulder 02/24/2013  . Ischemic cardiomyopathy 01/16/2013  . Sinus tachycardia 01/16/2013  . COPD, severe  O2 dependent 01/16/2013  . Cerebrovascular disease 01/08/2013  . Diabetes mellitus 06/12/2011  . Hypertension 06/12/2011  . Hyperlipidemia 06/12/2011  . Silicosis 06/12/2011  . Osteomyelitis of knee region 06/12/2011  . Cerebral artery occlusion   . Obesity   . Peripheral vascular disease     LABS    Component Value Date/Time   NA 137 02/26/2014 0843   NA 135* 02/25/2014 2200   NA 137 07/31/2013 0430   K 3.9 02/26/2014 0843   K 4.3 02/25/2014 2200   K  4.2 07/31/2013 0430   CL 95* 02/26/2014 0843   CL 93* 02/25/2014 2200   CL 97 07/31/2013 0430   CO2 28 02/26/2014 0843   CO2 29 02/25/2014 2200   CO2 24 07/31/2013 0430   GLUCOSE 313* 02/26/2014 0843   GLUCOSE 418* 02/25/2014 2200   GLUCOSE 324* 07/31/2013 0430   BUN 15 02/26/2014 0843   BUN 18 02/25/2014 2200   BUN 17 07/31/2013 0430   CREATININE 0.97 02/26/2014 0843   CREATININE 0.99 02/26/2014 0312   CREATININE 0.97 02/25/2014 2200   CREATININE 1.89* 07/05/2013 1625   CREATININE 1.29 07/19/2011 1207   CALCIUM 9.6 02/26/2014 0843   CALCIUM 9.7 02/25/2014 2200   CALCIUM 9.9 07/31/2013 0430   GFRNONAA 85* 02/26/2014 0843   GFRNONAA 84* 02/26/2014 0312   GFRNONAA 85* 02/25/2014 2200   GFRNONAA 59* 07/19/2011 1207   GFRAA >90 02/26/2014 0843   GFRAA >90 02/26/2014 0312   GFRAA >90 02/25/2014 2200   GFRAA 68 07/19/2011 1207   CMP     Component Value Date/Time   NA 137 02/26/2014 0843   K 3.9 02/26/2014 0843   CL 95* 02/26/2014 0843   CO2 28 02/26/2014 0843   GLUCOSE 313* 02/26/2014 0843   BUN 15 02/26/2014 0843   CREATININE 0.97 02/26/2014 0843   CREATININE 1.89* 07/05/2013 1625   CALCIUM 9.6 02/26/2014 0843   PROT 7.0 02/26/2014 0843   ALBUMIN 3.8 02/26/2014 0843   AST 10 02/26/2014 0843   ALT 11 02/26/2014 0843   ALKPHOS 77 02/26/2014 0843   BILITOT 0.5 02/26/2014 0843   GFRNONAA 85* 02/26/2014 0843   GFRNONAA 59* 07/19/2011 1207   GFRAA >90 02/26/2014 0843   GFRAA 68 07/19/2011 1207       Component Value Date/Time   WBC 7.5 02/26/2014 0843   WBC 6.3 02/26/2014 0312   WBC 8.3 02/25/2014 2200   HGB 14.1 02/26/2014 0843   HGB 12.7* 02/26/2014 0312   HGB 13.5 02/25/2014 2200   HCT 40.5 02/26/2014 0843   HCT 36.4* 02/26/2014 0312   HCT 38.5* 02/25/2014 2200   MCV 86.9 02/26/2014 0843   MCV 86.7 02/26/2014 0312   MCV 86.9 02/25/2014 2200    Lipid Panel     Component Value Date/Time   CHOL 245* 01/15/2013 0800   TRIG 340* 01/15/2013 0800   HDL 22*  01/15/2013 0800   CHOLHDL 11.1 01/15/2013 0800   VLDL 68* 01/15/2013 0800   LDLCALC 155* 01/15/2013 0800    ABG    Component Value Date/Time   PHART 7.336* 01/15/2013 1400   PCO2ART 45.6* 01/15/2013 1400  PO2ART 81.0 01/15/2013 1400   HCO3 24.4* 01/15/2013 1400   TCO2 26 01/15/2013 1400   ACIDBASEDEF 2.0 01/15/2013 1400   O2SAT 95.0 01/15/2013 1400     Lab Results  Component Value Date   TSH 1.460 07/30/2013   BNP (last 3 results)  Recent Labs  07/30/13 1523 02/25/14 2200  PROBNP 604.2* 1014.0*   Cardiac Panel (last 3 results) No results for input(s): CKTOTAL, CKMB, TROPONINI, RELINDX in the last 72 hours.  Iron/TIBC/Ferritin/ %Sat No results found for: IRON, TIBC, FERRITIN, IRONPCTSAT   EKG Orders placed or performed during the hospital encounter of 02/25/14  . ED EKG  . ED EKG  . EKG 12-Lead  . EKG 12-Lead  . EKG 12-Lead  . EKG 12-Lead  . EKG - 12 lead  . EKG - 12 lead  . EKG     Prior Assessment and Plan Problem List as of 04/25/2014      Cardiovascular and Mediastinum   Cerebral artery occlusion   Peripheral vascular disease   Hypertension   Last Assessment & Plan 03/21/2014 Office Visit Written 03/21/2014  2:18 PM by Jodelle Gross, NP    Hypotensive. With low EF will tolerate systolic in the 90's. He may be a little dehydrated. Checking BMET.       Cerebrovascular disease   Ischemic cardiomyopathy   Last Assessment & Plan 09/10/2013 Office Visit Written 09/10/2013  1:53 PM by Jodelle Gross, NP    Not titrate his medications any further. And continue him on his current medication regimen. He is hypotensive here, and appears euvolemic. He refuses life vest which has been offered to him. We will see him again in 3 months unless he requires sooner evaluation      Chronic systolic dysfunction of left ventricle   Last Assessment & Plan 03/21/2014 Office Visit Written 03/21/2014  2:16 PM by Jodelle Gross, NP    He is dizzy and lightheaded,  and hypotensive and has been taking 3 different doses of isosorbide. I have gone over each medication and discussed it with him. He will only take isosorbide 30 mg once a day. I am also going to take BMET. He has not taken his lasix for two days as he is very dizzy and states that he is not urinating very much.       Unstable angina   Last Assessment & Plan 09/10/2013 Office Visit Written 09/10/2013  1:52 PM by Jodelle Gross, NP    He states that he has chest discomfort or shortness of breath when he forgets to take his medications. He states that he did forget for couple days, and nitroglycerin was used sublingual. He is being reminded daily by his grandson to take his medications when his grandson comes home from school.  I have again reinforced the need to take his medications as directed. [facility] will be in touch, we have talked with them again by phone here in the office and verified his cell phone number. He is willing to have them come out and evaluate his current status.      Secondary pulmonary hypertension   Aortic atherosclerosis     Respiratory   Silicosis   COPD, severe  O2 dependent     Endocrine   Diabetes mellitus   Last Assessment & Plan 08/10/2013 Office Visit Written 08/10/2013  2:52 PM by Jodelle Gross, NP    He will be followed by his primary care physician Dr. Juanetta Gosling. He states  it is very difficult to control. He may be having some diabetic gastroparesis with abdominal pain and burping. Referral to GI specialist her PCP is recommended at his discretion        Musculoskeletal and Integument   Osteomyelitis of knee region   Last Assessment & Plan 09/04/2011 Office Visit Edited 09/04/2011 12:01 PM by Ginnie SmartJeffrey C Hatcher, MD    Somewhat limited with Cx (-). WIll cont his IV therapy as previously written and then transition him to bactrim. He has seen Dr Shelle IronBeane who [facility]. Will see him back in 6 weeks and consider MRI at that time. He will  call if he has worsening in the intervening period.      Rotator cuff syndrome of left shoulder     Other   Obesity   Last Assessment & Plan 01/26/2013 Office Visit Written 01/26/2013  1:38 PM by Jodelle GrossKathryn M Lawrence, NP    Very sedentary uses wheel chair for ambulation,       Hyperlipidemia   Last Assessment & Plan 01/26/2013 Office Visit Written 01/26/2013  1:37 PM by Jodelle GrossKathryn M Lawrence, NP    Continue statin. Follow up labs in 3 months.      Sinus tachycardia   Last Assessment & Plan 03/21/2014 Office Visit Written 03/21/2014  2:19 PM by Jodelle GrossKathryn M Lawrence, NP    Consider [place] if HR remains elevated.       Chest pain   Last Assessment & Plan 08/10/2013 Office Visit Written 08/10/2013  2:53 PM by Jodelle GrossKathryn M Lawrence, NP    Chronic for him. He is narcotic dependent.      DNR no code (do not resuscitate)   Chest pain, rule out acute myocardial infarction       Imaging: No results found.

## 2014-04-25 NOTE — Progress Notes (Signed)
HPI: [name] is a 64 year old patient of Dr. Wyline MoodBranch that we follow for ongoing assessment and management of CAD, history of coronary artery bypass grafting, significant systolic dysfunction, with most recent EF of 18% with moderate inferior wall infarct, fairly minimal, per nuclear medicine study.  Cardiac catheterization in October 2004 revealed LVEF of 15-20%, other history includes hypertension, hyperlipidemia, and peripheral vascular disease.  The patient is a DO NOT RESUSCITATE and has refused, ICD placement.  On last office visit in December of 2015, he complained of musculoskeletal and joint pain.  He was also complaining of dizziness and lightheadedness, and not urinating very much.  He was found be hypotensive, but with low EF.  He was tolerating blood pressures in the 90s.  A BMET was checked to evaluate for dehydration.unfortunately, results are not available uncertain if the patient had blood work completed.  He states that since being seen last he has been taking up to 6 or 7 nitroglycerin a day, sublingual.  He states that it usually happens when he lays down in his bed.  Sometimes with minimal exertion.  He is oxygen dependent, and often becomes very short of breath with minimal exertion.  He also is complaining of a rash on his left hip, which he believes started from use of adult incontinent briefs irritating his skin. He states he does have chronic pain, and does use oxycodone for this, but does not like to sit on because it causes constipation.  He admits that his blood glucose has remained elevated as well.    Allergies  Allergen Reactions  . Baycol [Cerivastatin]   . Stadol [Butorphanol Tartrate] Other (See Comments)    "makes him crazy"    Current Outpatient Prescriptions  Medication Sig Dispense Refill  . Aspirin-Salicylamide-Caffeine (BC FAST PAIN RELIEF ARTHRITIS) [phone] MG PACK Take 1 packet by mouth 3 (three) times daily as needed (for pain).    [name].  atorvastatin (LIPITOR) 80 MG tablet Take 1 tablet (80 mg total) by mouth daily. 90 tablet 3  . carvedilol (COREG) 6.25 MG tablet Take 1 tablet (6.25 mg total) by mouth 2 (two) times daily. 180 tablet 3  . clopidogrel (PLAVIX) 75 MG tablet Take 75 mg by mouth daily.     . digoxin (LANOXIN) 0.25 MG tablet Take 1 tablet (0.25 mg total) by mouth daily. 30 tablet 12  . furosemide (LASIX) 80 MG tablet Take 80 mg by mouth 2 (two) times daily.    . insulin NPH Human (HUMULIN N,NOVOLIN N) 100 UNIT/ML injection Inject 0.75 mLs (75 Units total) into the skin 2 (two) times daily. 10 mL 11  . isosorbide mononitrate (IMDUR) 60 MG 24 hr tablet Take 1 tablet (60 mg total) by mouth daily. 90 tablet 3  . KLOR-CON M20 20 MEQ tablet Take 20 mEq by mouth daily.     [name]. lisinopril (PRINIVIL,ZESTRIL) 2.5 MG tablet TAKE ONE TABLET BY MOUTH ONCE DAILY 30 tablet 6  . nitroGLYCERIN (NITROSTAT) 0.4 MG SL tablet Place 1 tablet (0.4 mg total) under the tongue every 5 (five) minutes as needed for chest pain. 25 tablet 3  . oxyCODONE-acetaminophen (PERCOCET) 10-325 MG per tablet Take 1 tablet by mouth every 4 (four) hours as needed for pain.    . ranolazine (RANEXA) 500 MG 12 hr tablet Take 1 tablet (500 mg total) by mouth 2 (two) times daily. 60 tablet 12  . sulfamethoxazole-trimethoprim (BACTRIM DS,SEPTRA DS) 800-160 MG per tablet Take 1 tablet by mouth 2 (two) times  daily.    . vitamin B-12 (CYANOCOBALAMIN) 1000 MCG tablet Take 1,000 mcg by mouth daily.    . vitamin C (ASCORBIC ACID) 500 MG tablet Take 1,000 mg by mouth at bedtime.    . vitamin E 400 UNIT capsule Take 400 Units by mouth at bedtime.      No current facility-administered medications for this visit.    Past Medical History  Diagnosis Date  . Uncontrolled diabetes mellitus     a. A1C 12.8 in 01/2013.  [name] Chronic systolic heart failure   . Obesity   . Hyperlipidemia   . Peripheral vascular disease   . Hypertension   . Asthma   . CAD (coronary artery  disease)     a. Prior CABG 20 yrs ago (?~1994). b. Hx of multiple stents, prev followed in [place]. c. [place] 01/2013: cath moderate disease in LAD, distal LCx, prox RCA occluded, 2 SVGs occluded, felt to be stable from prior cath -> for medical therapy.  . Ischemic cardiomyopathy     a. EF 2012: 55-60. b. EF 12/2011: 25-30%. c. Echo 01/2013: EF 15-20%.  . Stroke     15 years ago  . Arthritis   . Anxiety   . Depression   . 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 Chronic respiratory failure     a. Due to COPD.  . Osteomyelitis 2013    was planned for chronic suppressive antibiotics but cannot afford this any longer  . DNR no code (do not resuscitate) 07/2013  . CHF (congestive heart failure)   . Diabetes mellitus without complication     Past Surgical History  Procedure Laterality Date  . Coronary artery bypass graft    . Coronary stent placement  2008    CABG grafts closed  . Joint replacement    . Cholecystectomy    . Eye surgery    . I&d extremity  06/12/2011    Procedure: IRRIGATION AND DEBRIDEMENT EXTREMITY;  Surgeon: Javier Docker, MD;  Location: [facility] OR;  Service: Orthopedics;  Laterality: Right;  I&D Right [place]  . Left and right heart catheterization with coronary/graft angiogram N/A 01/15/2013    Procedure: LEFT AND RIGHT HEART CATHETERIZATION WITH Isabel Caprice;  Surgeon: Alvia Grove, MD;  Location: [facility] CATH LAB;  Service: Cardiovascular;  Laterality: N/A;    [id number] review of systems performed and found to be negative unless outlined above  PHYSICAL EXAM BP 138/74 mmHg  Pulse 63  Ht  (1.88 m)  Wt 232 lb (105.235 kg)  BMI 29.77 kg/m2  SpO2 95% General: Well developed, well nourished, in no acute distress, wearing  O2 via [place]. Head: Eyes PERRLA, Bilateral xanthomas.   Normal cephalic and atramatic  Lungs: Bilateral crackles, no wheezes, or cough, wearing oxygen.  HEENT nasal cannula, Heart:  HRRR S1 S2,slightly tachycardicwithout MRG.  Pulses are 2+ & equal.            No carotid bruit. No JVD.  No abdominal bruits. No femoral bruits. Abdomen: Bowel sounds are positive, abdomen soft and non-tender without masses or                  Hernia's noted. Msk:  Back normal, normal gait. Normal strength and tone for age. Extremities: No clubbing, cyanosis or edema.  DP +1there is a small rash on his left hip, which appears to be fading.  He states it is  still having pyretic symptoms. Neuro: Alert and oriented X 3. Psych:  Good affect, responds appropriately   ASSESSMENT AND PLAN

## 2014-04-25 NOTE — Assessment & Plan Note (Signed)
He has developed a rash after wearing inexpensive incontinent briefs.  He is advised to buy a better quality brief, and I have provided him with hydrocortisone cream topically for symptomatic relief

## 2014-04-25 NOTE — Assessment & Plan Note (Signed)
Most recent cardiac catheterization in October 2014 revealed left main 60% LAD 60%, mid and left circumflex and proximal patent stent and distal occluded.  Circumflex.  RCA occluded, SVG to RCA occluded, SVG to OM occluded.  EF of 15-20%.  He does not wish to undergo additional cardiac catheterization or interventions.  He is a DO NOT RESUSCITATE.  We will treat him medically for now.

## 2014-04-25 NOTE — Assessment & Plan Note (Signed)
Not well controlled currently.  Is being followed by his primary care physician, for ongoing management, he is on insulin.  He is also on steroid inhalers.

## 2014-04-25 NOTE — Patient Instructions (Signed)
Your physician wants you to follow-up in: 6 months with Joni ReiningKathryn Lawrence, NP.  You will receive a reminder letter in the mail two months in advance. If you don't receive a letter, please call our office to schedule the follow-up appointment.  Your physician has recommended you make the following change in your medication:   Increase: Imdur to 60 mg daily   Thank you for choosing [facility]!

## 2014-04-25 NOTE — Assessment & Plan Note (Signed)
He continues on O2 via nasal cannula therapy, with occasional use of nonrebreather mask when he has episodes of severe dyspnea.  He is followed by Dr. Juanetta GoslingHawkins, who is managing his oxygen.

## 2014-04-25 NOTE — Assessment & Plan Note (Signed)
He has been taking extra doses of nitroglycerin, has been having chronic pain, usually associated with exertion, and lying flat on his back.  He states he is taking up to 7 nitroglycerin sublingual daily.  I will increase his isosorbide to 60 mg daily.  If he is having to take the extra doses of nitroglycerin.  The 30 mg dose this time.  Recovering him.  His blood pressure is 130/74 today and I think, that he will be able to tolerate the higher dose.  He is also given refills on sublingual nitroglycerin.  I am concerned that all of this is cardiac with continued elevated blood glucose and end-stage COPD.  He states that the nitroglycerin is helpful.

## 2014-04-28 ENCOUNTER — Encounter: Payer: Self-pay | Admitting: Adult Health

## 2014-05-06 ENCOUNTER — Inpatient Hospital Stay (HOSPITAL_COMMUNITY)
Admission: EM | Admit: 2014-05-06 | Discharge: 2014-05-10 | DRG: 292 | Disposition: A | Discharge status: Home / Self Care | Payer: Commercial Managed Care - HMO | Attending: Pulmonary Disease | Admitting: Pulmonary Disease

## 2014-05-06 ENCOUNTER — Encounter (HOSPITAL_COMMUNITY): Payer: Self-pay

## 2014-05-06 DIAGNOSIS — R000 Tachycardia, unspecified: Secondary | ICD-10-CM | POA: Diagnosis present

## 2014-05-06 DIAGNOSIS — J628 Pneumoconiosis due to other dust containing silica: Secondary | ICD-10-CM | POA: Diagnosis present

## 2014-05-06 DIAGNOSIS — I509 Heart failure, unspecified: Secondary | ICD-10-CM

## 2014-05-06 DIAGNOSIS — R7989 Other specified abnormal findings of blood chemistry: Secondary | ICD-10-CM

## 2014-05-06 DIAGNOSIS — J45909 Unspecified asthma, uncomplicated: Secondary | ICD-10-CM | POA: Diagnosis present

## 2014-05-06 DIAGNOSIS — E785 Hyperlipidemia, unspecified: Secondary | ICD-10-CM | POA: Diagnosis present

## 2014-05-06 DIAGNOSIS — Z9981 Dependence on supplemental oxygen: Secondary | ICD-10-CM

## 2014-05-06 DIAGNOSIS — I25810 Atherosclerosis of coronary artery bypass graft(s) without angina pectoris: Secondary | ICD-10-CM | POA: Diagnosis present

## 2014-05-06 DIAGNOSIS — I10 Essential (primary) hypertension: Secondary | ICD-10-CM | POA: Diagnosis present

## 2014-05-06 DIAGNOSIS — Z87891 Personal history of nicotine dependence: Secondary | ICD-10-CM

## 2014-05-06 DIAGNOSIS — Z79899 Other long term (current) drug therapy: Secondary | ICD-10-CM

## 2014-05-06 DIAGNOSIS — Z794 Long term (current) use of insulin: Secondary | ICD-10-CM

## 2014-05-06 DIAGNOSIS — Z951 Presence of aortocoronary bypass graft: Secondary | ICD-10-CM

## 2014-05-06 DIAGNOSIS — G894 Chronic pain syndrome: Secondary | ICD-10-CM | POA: Diagnosis present

## 2014-05-06 DIAGNOSIS — R079 Chest pain, unspecified: Secondary | ICD-10-CM | POA: Diagnosis present

## 2014-05-06 DIAGNOSIS — F32A Depression, unspecified: Secondary | ICD-10-CM | POA: Diagnosis present

## 2014-05-06 DIAGNOSIS — Z8249 Family history of ischemic heart disease and other diseases of the circulatory system: Secondary | ICD-10-CM

## 2014-05-06 DIAGNOSIS — E871 Hypo-osmolality and hyponatremia: Secondary | ICD-10-CM | POA: Diagnosis present

## 2014-05-06 DIAGNOSIS — R0602 Shortness of breath: Secondary | ICD-10-CM

## 2014-05-06 DIAGNOSIS — K5900 Constipation, unspecified: Secondary | ICD-10-CM | POA: Diagnosis present

## 2014-05-06 DIAGNOSIS — I739 Peripheral vascular disease, unspecified: Secondary | ICD-10-CM | POA: Diagnosis present

## 2014-05-06 DIAGNOSIS — Z8673 Personal history of transient ischemic attack (TIA), and cerebral infarction without residual deficits: Secondary | ICD-10-CM

## 2014-05-06 DIAGNOSIS — I5023 Acute on chronic systolic (congestive) heart failure: Secondary | ICD-10-CM | POA: Diagnosis not present

## 2014-05-06 DIAGNOSIS — J441 Chronic obstructive pulmonary disease with (acute) exacerbation: Secondary | ICD-10-CM | POA: Diagnosis present

## 2014-05-06 DIAGNOSIS — Z955 Presence of coronary angioplasty implant and graft: Secondary | ICD-10-CM

## 2014-05-06 DIAGNOSIS — E119 Type 2 diabetes mellitus without complications: Secondary | ICD-10-CM

## 2014-05-06 DIAGNOSIS — J449 Chronic obstructive pulmonary disease, unspecified: Secondary | ICD-10-CM | POA: Diagnosis present

## 2014-05-06 DIAGNOSIS — J9610 Chronic respiratory failure, unspecified whether with hypoxia or hypercapnia: Secondary | ICD-10-CM | POA: Diagnosis present

## 2014-05-06 DIAGNOSIS — F411 Generalized anxiety disorder: Secondary | ICD-10-CM | POA: Diagnosis present

## 2014-05-06 DIAGNOSIS — I255 Ischemic cardiomyopathy: Secondary | ICD-10-CM | POA: Diagnosis present

## 2014-05-06 DIAGNOSIS — F329 Major depressive disorder, single episode, unspecified: Secondary | ICD-10-CM | POA: Diagnosis present

## 2014-05-06 DIAGNOSIS — M1990 Unspecified osteoarthritis, unspecified site: Secondary | ICD-10-CM | POA: Diagnosis present

## 2014-05-06 DIAGNOSIS — E440 Moderate protein-calorie malnutrition: Secondary | ICD-10-CM | POA: Diagnosis present

## 2014-05-06 DIAGNOSIS — Z6828 Body mass index (BMI) 28.0-28.9, adult: Secondary | ICD-10-CM

## 2014-05-06 DIAGNOSIS — Z79891 Long term (current) use of opiate analgesic: Secondary | ICD-10-CM

## 2014-05-06 DIAGNOSIS — Z66 Do not resuscitate: Secondary | ICD-10-CM | POA: Diagnosis present

## 2014-05-06 MED ORDER — METHYLPREDNISOLONE SODIUM SUCC 125 MG IJ SOLR
125.0000 mg | Freq: Once | INTRAMUSCULAR | Status: AC
Start: 2014-05-07 — End: 2014-05-07
  Administered 2014-05-07: 125 mg via INTRAVENOUS
  Filled 2014-05-06: qty 2

## 2014-05-06 MED ORDER — ALBUTEROL SULFATE (2.5 MG/3ML) 0.083% IN NEBU
5.0000 mg | INHALATION_SOLUTION | Freq: Once | RESPIRATORY_TRACT | Status: AC
  Administered 2014-05-07: 5 mg via RESPIRATORY_TRACT
  Filled 2014-05-06: qty 6

## 2014-05-06 NOTE — ED Notes (Signed)
Patient states today he is more short of breath than usual. Patient states he is having chest pain as well. Patient wears O2 3l/m at home. Patient also states he has skin irritation around his waist.

## 2014-05-06 NOTE — ED Notes (Signed)
Patient states "my nerves are on edge and i feel like i just want to end it all" patient states "i just want to ease the pain" patient denies having a suicidal plan at this time.

## 2014-05-07 ENCOUNTER — Encounter (HOSPITAL_COMMUNITY): Payer: Self-pay | Admitting: Internal Medicine

## 2014-05-07 ENCOUNTER — Emergency Department (HOSPITAL_COMMUNITY): Payer: Commercial Managed Care - HMO

## 2014-05-07 DIAGNOSIS — I5023 Acute on chronic systolic (congestive) heart failure: Secondary | ICD-10-CM | POA: Diagnosis present

## 2014-05-07 DIAGNOSIS — E871 Hypo-osmolality and hyponatremia: Secondary | ICD-10-CM | POA: Diagnosis present

## 2014-05-07 DIAGNOSIS — J441 Chronic obstructive pulmonary disease with (acute) exacerbation: Secondary | ICD-10-CM | POA: Diagnosis present

## 2014-05-07 DIAGNOSIS — E119 Type 2 diabetes mellitus without complications: Secondary | ICD-10-CM | POA: Diagnosis present

## 2014-05-07 DIAGNOSIS — R079 Chest pain, unspecified: Secondary | ICD-10-CM

## 2014-05-07 DIAGNOSIS — Z79899 Other long term (current) drug therapy: Secondary | ICD-10-CM | POA: Diagnosis not present

## 2014-05-07 DIAGNOSIS — R000 Tachycardia, unspecified: Secondary | ICD-10-CM | POA: Diagnosis present

## 2014-05-07 DIAGNOSIS — I255 Ischemic cardiomyopathy: Secondary | ICD-10-CM | POA: Diagnosis present

## 2014-05-07 DIAGNOSIS — Z951 Presence of aortocoronary bypass graft: Secondary | ICD-10-CM | POA: Diagnosis not present

## 2014-05-07 DIAGNOSIS — I509 Heart failure, unspecified: Secondary | ICD-10-CM

## 2014-05-07 DIAGNOSIS — K5900 Constipation, unspecified: Secondary | ICD-10-CM | POA: Diagnosis present

## 2014-05-07 DIAGNOSIS — Z8673 Personal history of transient ischemic attack (TIA), and cerebral infarction without residual deficits: Secondary | ICD-10-CM | POA: Diagnosis not present

## 2014-05-07 DIAGNOSIS — F329 Major depressive disorder, single episode, unspecified: Secondary | ICD-10-CM | POA: Diagnosis present

## 2014-05-07 DIAGNOSIS — E440 Moderate protein-calorie malnutrition: Secondary | ICD-10-CM | POA: Diagnosis present

## 2014-05-07 DIAGNOSIS — Z87891 Personal history of nicotine dependence: Secondary | ICD-10-CM | POA: Diagnosis not present

## 2014-05-07 DIAGNOSIS — J45909 Unspecified asthma, uncomplicated: Secondary | ICD-10-CM | POA: Diagnosis present

## 2014-05-07 DIAGNOSIS — Z794 Long term (current) use of insulin: Secondary | ICD-10-CM | POA: Diagnosis not present

## 2014-05-07 DIAGNOSIS — I10 Essential (primary) hypertension: Secondary | ICD-10-CM | POA: Diagnosis present

## 2014-05-07 DIAGNOSIS — Z79891 Long term (current) use of opiate analgesic: Secondary | ICD-10-CM | POA: Diagnosis not present

## 2014-05-07 DIAGNOSIS — Z6828 Body mass index (BMI) 28.0-28.9, adult: Secondary | ICD-10-CM | POA: Diagnosis not present

## 2014-05-07 DIAGNOSIS — Z8249 Family history of ischemic heart disease and other diseases of the circulatory system: Secondary | ICD-10-CM | POA: Diagnosis not present

## 2014-05-07 DIAGNOSIS — Z9981 Dependence on supplemental oxygen: Secondary | ICD-10-CM | POA: Diagnosis not present

## 2014-05-07 DIAGNOSIS — R0602 Shortness of breath: Secondary | ICD-10-CM | POA: Diagnosis present

## 2014-05-07 DIAGNOSIS — M1990 Unspecified osteoarthritis, unspecified site: Secondary | ICD-10-CM | POA: Diagnosis present

## 2014-05-07 DIAGNOSIS — I739 Peripheral vascular disease, unspecified: Secondary | ICD-10-CM | POA: Diagnosis present

## 2014-05-07 DIAGNOSIS — Z66 Do not resuscitate: Secondary | ICD-10-CM | POA: Diagnosis present

## 2014-05-07 DIAGNOSIS — Z955 Presence of coronary angioplasty implant and graft: Secondary | ICD-10-CM | POA: Diagnosis not present

## 2014-05-07 DIAGNOSIS — E785 Hyperlipidemia, unspecified: Secondary | ICD-10-CM | POA: Diagnosis present

## 2014-05-07 DIAGNOSIS — J9610 Chronic respiratory failure, unspecified whether with hypoxia or hypercapnia: Secondary | ICD-10-CM | POA: Diagnosis present

## 2014-05-07 DIAGNOSIS — I25810 Atherosclerosis of coronary artery bypass graft(s) without angina pectoris: Secondary | ICD-10-CM | POA: Diagnosis present

## 2014-05-07 DIAGNOSIS — F411 Generalized anxiety disorder: Secondary | ICD-10-CM | POA: Diagnosis present

## 2014-05-07 DIAGNOSIS — G894 Chronic pain syndrome: Secondary | ICD-10-CM | POA: Diagnosis present

## 2014-05-07 LAB — COMPREHENSIVE METABOLIC PANEL
ALK PHOS: 72 U/L (ref 39–117)
ALT: 11 U/L (ref 0–53)
ALT: 11 U/L (ref 0–53)
AST: 10 U/L (ref 0–37)
AST: 12 U/L (ref 0–37)
Albumin: 3.4 g/dL — ABNORMAL LOW (ref 3.5–5.2)
Albumin: 3.4 g/dL — ABNORMAL LOW (ref 3.5–5.2)
Alkaline Phosphatase: 62 U/L (ref 39–117)
Anion gap: 8 (ref 5–15)
Anion gap: 10 (ref 5–15)
BILIRUBIN TOTAL: 0.8 mg/dL (ref 0.3–1.2)
BUN: 16 mg/dL (ref 6–23)
BUN: 17 mg/dL (ref 6–23)
CALCIUM: 8.7 mg/dL (ref 8.4–10.5)
CO2: 27 mmol/L (ref 19–32)
CO2: 28 mmol/L (ref 19–32)
CREATININE: 1.02 mg/dL (ref 0.50–1.35)
Calcium: 9 mg/dL (ref 8.4–10.5)
Chloride: 97 mmol/L (ref 96–112)
Chloride: 93 mmol/L — ABNORMAL LOW (ref 96–112)
Creatinine, Ser: 0.87 mg/dL (ref 0.50–1.35)
GFR calc Af Amer: >90 mL/min (ref >90)
GFR calc non Af Amer: 89 mL/min — ABNORMAL LOW (ref >90)
GFR, EST AFRICAN AMERICAN: 87 mL/min — AB (ref >90)
GFR, EST NON AFRICAN AMERICAN: 75 mL/min — AB (ref >90)
Glucose, Bld: 360 mg/dL — ABNORMAL HIGH (ref 70–99)
Glucose, Bld: 358 mg/dL — ABNORMAL HIGH (ref 70–99)
POTASSIUM: 4.2 mmol/L (ref 3.5–5.1)
Potassium: 3.7 mmol/L (ref 3.5–5.1)
SODIUM: 131 mmol/L — AB (ref 135–145)
Sodium: 132 mmol/L — ABNORMAL LOW (ref 135–145)
Total Bilirubin: 0.8 mg/dL (ref 0.3–1.2)
Total Protein: 6.7 g/dL (ref 6.0–8.3)
Total Protein: 6.6 g/dL (ref 6.0–8.3)

## 2014-05-07 LAB — URINALYSIS, ROUTINE W REFLEX MICROSCOPIC
Bilirubin Urine: NEGATIVE
Glucose, UA: >1000 mg/dL — AB
Leukocytes, UA: NEGATIVE
Nitrite: NEGATIVE
PROTEIN: 30 mg/dL — AB
Specific Gravity, Urine: 1.015 (ref 1.005–1.030)
Urobilinogen, UA: 0.2 mg/dL (ref 0.0–1.0)
pH: 6.5 (ref 5.0–8.0)

## 2014-05-07 LAB — TROPONIN I
TROPONIN I: 0.09 ng/mL — AB (ref <0.031)
TROPONIN I: 0.11 ng/mL — AB (ref <0.031)
Troponin I: 0.09 ng/mL — ABNORMAL HIGH (ref <0.031)
Troponin I: 0.09 ng/mL — ABNORMAL HIGH (ref <0.031)

## 2014-05-07 LAB — CBC WITH DIFFERENTIAL/PLATELET
BASOS ABS: 0 10*3/uL (ref 0.0–0.1)
Basophils Absolute: 0 10*3/uL (ref 0.0–0.1)
Basophils Relative: 0 % (ref 0–1)
Basophils Relative: 0 % (ref 0–1)
EOS ABS: 0 10*3/uL (ref 0.0–0.7)
Eosinophils Absolute: 0 10*3/uL (ref 0.0–0.7)
Eosinophils Relative: 0 % (ref 0–5)
Eosinophils Relative: 0 % (ref 0–5)
HCT: 37.7 % — ABNORMAL LOW (ref 39.0–52.0)
HEMATOCRIT: 38.6 % — AB (ref 39.0–52.0)
HEMOGLOBIN: 12.7 g/dL — AB (ref 13.0–17.0)
Hemoglobin: 12.6 g/dL — ABNORMAL LOW (ref 13.0–17.0)
LYMPHS PCT: 4 % — AB (ref 12–46)
Lymphocytes Relative: 6 % — ABNORMAL LOW (ref 12–46)
Lymphs Abs: 0.4 10*3/uL — ABNORMAL LOW (ref 0.7–4.0)
Lymphs Abs: 0.7 10*3/uL (ref 0.7–4.0)
MCH: 29.4 pg (ref 26.0–34.0)
MCH: 30.1 pg (ref 26.0–34.0)
MCHC: 32.6 g/dL (ref 30.0–36.0)
MCHC: 33.7 g/dL (ref 30.0–36.0)
MCV: 89.3 fL (ref 78.0–100.0)
MCV: 90 fL (ref 78.0–100.0)
Monocytes Absolute: 0.1 10*3/uL (ref 0.1–1.0)
Monocytes Absolute: 0.6 10*3/uL (ref 0.1–1.0)
Monocytes Relative: 1 % — ABNORMAL LOW (ref 3–12)
Monocytes Relative: 5 % (ref 3–12)
NEUTROS PCT: 89 % — AB (ref 43–77)
Neutro Abs: 10.3 10*3/uL — ABNORMAL HIGH (ref 1.7–7.7)
Neutro Abs: 9.7 10*3/uL — ABNORMAL HIGH (ref 1.7–7.7)
Neutrophils Relative %: 95 % — ABNORMAL HIGH (ref 43–77)
PLATELETS: 265 10*3/uL (ref 150–400)
Platelets: 330 10*3/uL (ref 150–400)
RBC: 4.22 MIL/uL (ref 4.22–5.81)
RBC: 4.29 MIL/uL (ref 4.22–5.81)
RDW: 13.5 % (ref 11.5–15.5)
RDW: 13.7 % (ref 11.5–15.5)
WBC: 10.2 10*3/uL (ref 4.0–10.5)
WBC: 11.6 10*3/uL — AB (ref 4.0–10.5)

## 2014-05-07 LAB — LIPID PANEL
Cholesterol: 278 mg/dL — ABNORMAL HIGH (ref 0–200)
HDL: 43 mg/dL (ref >39)
LDL Cholesterol: 215 mg/dL — ABNORMAL HIGH (ref 0–99)
Total CHOL/HDL Ratio: 6.5 RATIO
Triglycerides: 99 mg/dL (ref <150)
VLDL: 20 mg/dL (ref 0–40)

## 2014-05-07 LAB — GLUCOSE, CAPILLARY
GLUCOSE-CAPILLARY: 390 mg/dL — AB (ref 70–99)
GLUCOSE-CAPILLARY: 319 mg/dL — AB (ref 70–99)
GLUCOSE-CAPILLARY: 246 mg/dL — AB (ref 70–99)
GLUCOSE-CAPILLARY: 315 mg/dL — AB (ref 70–99)
Glucose-Capillary: 300 mg/dL — ABNORMAL HIGH (ref 70–99)

## 2014-05-07 LAB — URINE MICROSCOPIC-ADD ON

## 2014-05-07 LAB — MRSA PCR SCREENING: MRSA BY PCR: NEGATIVE

## 2014-05-07 LAB — D-DIMER, QUANTITATIVE: D-Dimer, Quant: 19.97 ug/mL-FEU — ABNORMAL HIGH (ref 0.00–0.48)

## 2014-05-07 LAB — BRAIN NATRIURETIC PEPTIDE: B Natriuretic Peptide: 646 pg/mL — ABNORMAL HIGH (ref 0.0–100.0)

## 2014-05-07 LAB — TSH: TSH: 0.656 u[IU]/mL (ref 0.350–4.500)

## 2014-05-07 MED ORDER — VITAMIN E 180 MG (400 UNIT) PO CAPS
400.0000 [IU] | ORAL_CAPSULE | Freq: Every day | ORAL | Status: DC
  Administered 2014-05-07 – 2014-05-09 (×3): 400 [IU] via ORAL
  Filled 2014-05-07 (×4): qty 1

## 2014-05-07 MED ORDER — INSULIN ASPART 100 UNIT/ML ~~LOC~~ SOLN
0.0000 [IU] | Freq: Three times a day (TID) | SUBCUTANEOUS | Status: DC
  Administered 2014-05-07: 15 [IU] via SUBCUTANEOUS
  Administered 2014-05-07: 7 [IU] via SUBCUTANEOUS
  Administered 2014-05-07 – 2014-05-08 (×2): 11 [IU] via SUBCUTANEOUS
  Administered 2014-05-08: 7 [IU] via SUBCUTANEOUS
  Administered 2014-05-08: 20 [IU] via SUBCUTANEOUS
  Administered 2014-05-09 – 2014-05-10 (×4): 7 [IU] via SUBCUTANEOUS

## 2014-05-07 MED ORDER — NITROGLYCERIN 0.4 MG SL SUBL
0.4000 mg | SUBLINGUAL_TABLET | SUBLINGUAL | Status: DC | PRN
  Administered 2014-05-07 (×2): 0.4 mg via SUBLINGUAL
  Filled 2014-05-07 (×2): qty 1

## 2014-05-07 MED ORDER — ENSURE COMPLETE PO LIQD
237.0000 mL | Freq: Two times a day (BID) | ORAL | Status: DC
  Administered 2014-05-08 – 2014-05-10 (×5): 237 mL via ORAL

## 2014-05-07 MED ORDER — OXYCODONE-ACETAMINOPHEN 5-325 MG PO TABS
ORAL_TABLET | ORAL | Status: DC | PRN
  Administered 2014-05-07 – 2014-05-10 (×10): 1 via ORAL
  Filled 2014-05-07 (×10): qty 1

## 2014-05-07 MED ORDER — INSULIN ASPART 100 UNIT/ML ~~LOC~~ SOLN: 0.0000 [IU] | Freq: Three times a day (TID) | SUBCUTANEOUS | Status: DC

## 2014-05-07 MED ORDER — FUROSEMIDE 10 MG/ML IJ SOLN
80.0000 mg | Freq: Once | INTRAMUSCULAR | Status: AC
  Administered 2014-05-07: 80 mg via INTRAVENOUS
  Filled 2014-05-07: qty 8

## 2014-05-07 MED ORDER — NITROGLYCERIN 2 % TD OINT
1.0000 [in_us] | TOPICAL_OINTMENT | Freq: Once | TRANSDERMAL | Status: AC
  Administered 2014-05-07: 1 [in_us] via TOPICAL
  Filled 2014-05-07: qty 1

## 2014-05-07 MED ORDER — LORAZEPAM 2 MG/ML IJ SOLN
0.5000 mg | Freq: Once | INTRAMUSCULAR | Status: AC
  Administered 2014-05-07: 0.5 mg via INTRAVENOUS

## 2014-05-07 MED ORDER — ALPRAZOLAM 0.25 MG PO TABS
0.2500 mg | ORAL_TABLET | Freq: Two times a day (BID) | ORAL | Status: DC | PRN
  Administered 2014-05-10: 0.25 mg via ORAL
  Filled 2014-05-07: qty 1

## 2014-05-07 MED ORDER — LORAZEPAM 2 MG/ML IJ SOLN
INTRAMUSCULAR | Status: AC
  Filled 2014-05-07: qty 1

## 2014-05-07 MED ORDER — POTASSIUM CHLORIDE CRYS ER 20 MEQ PO TBCR
20.0000 meq | EXTENDED_RELEASE_TABLET | Freq: Every day | ORAL | Status: DC
  Administered 2014-05-07 – 2014-05-10 (×4): 20 meq via ORAL
  Filled 2014-05-07 (×4): qty 1

## 2014-05-07 MED ORDER — DIPHENHYDRAMINE HCL 25 MG PO CAPS
25.0000 mg | ORAL_CAPSULE | ORAL | Status: DC | PRN
  Administered 2014-05-07 – 2014-05-10 (×12): 25 mg via ORAL
  Filled 2014-05-07 (×13): qty 1

## 2014-05-07 MED ORDER — INSULIN ASPART 100 UNIT/ML ~~LOC~~ SOLN
0.0000 [IU] | SUBCUTANEOUS | Status: DC
  Administered 2014-05-07: 15 [IU] via SUBCUTANEOUS

## 2014-05-07 MED ORDER — DIGOXIN 125 MCG PO TABS
0.2500 mg | ORAL_TABLET | Freq: Every day | ORAL | Status: DC
  Administered 2014-05-07 – 2014-05-10 (×4): 0.25 mg via ORAL
  Filled 2014-05-07 (×4): qty 2

## 2014-05-07 MED ORDER — SODIUM CHLORIDE 0.9 % IJ SOLN
3.0000 mL | Freq: Two times a day (BID) | INTRAMUSCULAR | Status: DC
  Administered 2014-05-07 – 2014-05-09 (×5): 3 mL via INTRAVENOUS

## 2014-05-07 MED ORDER — PREDNISONE 20 MG PO TABS
60.0000 mg | ORAL_TABLET | Freq: Every day | ORAL | Status: DC
  Administered 2014-05-07 – 2014-05-10 (×4): 60 mg via ORAL
  Filled 2014-05-07 (×4): qty 3

## 2014-05-07 MED ORDER — VITAMIN B-12 1000 MCG PO TABS
1000.0000 ug | ORAL_TABLET | Freq: Every day | ORAL | Status: DC
  Administered 2014-05-07 – 2014-05-10 (×4): 1000 ug via ORAL
  Filled 2014-05-07 (×4): qty 1

## 2014-05-07 MED ORDER — INSULIN ASPART 100 UNIT/ML ~~LOC~~ SOLN: 0.0000 [IU] | Freq: Every day | SUBCUTANEOUS | Status: DC

## 2014-05-07 MED ORDER — INSULIN NPH (HUMAN) (ISOPHANE) 100 UNIT/ML ~~LOC~~ SUSP
75.0000 [IU] | Freq: Two times a day (BID) | SUBCUTANEOUS | Status: DC
  Filled 2014-05-07: qty 10

## 2014-05-07 MED ORDER — DOCUSATE SODIUM 100 MG PO CAPS
100.0000 mg | ORAL_CAPSULE | Freq: Two times a day (BID) | ORAL | Status: DC
  Administered 2014-05-07 – 2014-05-10 (×5): 100 mg via ORAL
  Filled 2014-05-07 (×7): qty 1

## 2014-05-07 MED ORDER — INSULIN ASPART 100 UNIT/ML ~~LOC~~ SOLN
0.0000 [IU] | Freq: Every day | SUBCUTANEOUS | Status: DC
  Administered 2014-05-07 – 2014-05-08 (×2): 4 [IU] via SUBCUTANEOUS
  Administered 2014-05-09: 3 [IU] via SUBCUTANEOUS

## 2014-05-07 MED ORDER — FUROSEMIDE 10 MG/ML IJ SOLN
80.0000 mg | Freq: Two times a day (BID) | INTRAMUSCULAR | Status: DC
  Administered 2014-05-07 – 2014-05-08 (×4): 80 mg via INTRAVENOUS
  Filled 2014-05-07 (×5): qty 8

## 2014-05-07 MED ORDER — HYDROCORTISONE 1 % EX CREA
TOPICAL_CREAM | Freq: Two times a day (BID) | CUTANEOUS | Status: DC | PRN
  Filled 2014-05-07: qty 28

## 2014-05-07 MED ORDER — LEVOFLOXACIN IN D5W 500 MG/100ML IV SOLN
500.0000 mg | INTRAVENOUS | Status: DC
  Administered 2014-05-07 – 2014-05-09 (×3): 500 mg via INTRAVENOUS
  Filled 2014-05-07 (×3): qty 100

## 2014-05-07 MED ORDER — CARVEDILOL 3.125 MG PO TABS
6.2500 mg | ORAL_TABLET | Freq: Two times a day (BID) | ORAL | Status: DC
  Administered 2014-05-07 – 2014-05-10 (×7): 6.25 mg via ORAL
  Filled 2014-05-07 (×7): qty 2

## 2014-05-07 MED ORDER — CETYLPYRIDINIUM CHLORIDE 0.05 % MT LIQD
7.0000 mL | Freq: Two times a day (BID) | OROMUCOSAL | Status: DC
  Administered 2014-05-07 – 2014-05-10 (×7): 7 mL via OROMUCOSAL

## 2014-05-07 MED ORDER — RANOLAZINE ER 500 MG PO TB12
500.0000 mg | ORAL_TABLET | Freq: Two times a day (BID) | ORAL | Status: DC
  Administered 2014-05-07 – 2014-05-09 (×5): 500 mg via ORAL
  Filled 2014-05-07 (×6): qty 1

## 2014-05-07 MED ORDER — NITROGLYCERIN 2 % TD OINT
1.0000 [in_us] | TOPICAL_OINTMENT | Freq: Three times a day (TID) | TRANSDERMAL | Status: DC
  Administered 2014-05-07 – 2014-05-08 (×4): 1 [in_us] via TOPICAL
  Filled 2014-05-07 (×4): qty 1

## 2014-05-07 MED ORDER — METOPROLOL TARTRATE 1 MG/ML IV SOLN: 2.5000 mg | Freq: Once | INTRAVENOUS | Status: DC

## 2014-05-07 MED ORDER — CLOPIDOGREL BISULFATE 75 MG PO TABS
75.0000 mg | ORAL_TABLET | Freq: Every day | ORAL | Status: DC
  Administered 2014-05-07 – 2014-05-10 (×4): 75 mg via ORAL
  Filled 2014-05-07 (×4): qty 1

## 2014-05-07 MED ORDER — ATORVASTATIN CALCIUM 40 MG PO TABS
80.0000 mg | ORAL_TABLET | Freq: Every day | ORAL | Status: DC
  Administered 2014-05-07 – 2014-05-10 (×4): 80 mg via ORAL
  Filled 2014-05-07 (×4): qty 2

## 2014-05-07 MED ORDER — LISINOPRIL 5 MG PO TABS
2.5000 mg | ORAL_TABLET | Freq: Every day | ORAL | Status: DC
  Administered 2014-05-07 – 2014-05-09 (×3): 2.5 mg via ORAL
  Filled 2014-05-07 (×3): qty 1

## 2014-05-07 MED ORDER — VITAMIN C 500 MG PO TABS
1000.0000 mg | ORAL_TABLET | Freq: Every day | ORAL | Status: DC
  Administered 2014-05-07 – 2014-05-09 (×3): 1000 mg via ORAL
  Filled 2014-05-07 (×3): qty 2

## 2014-05-07 MED ORDER — SERTRALINE HCL 50 MG PO TABS
50.0000 mg | ORAL_TABLET | Freq: Every day | ORAL | Status: DC
  Administered 2014-05-07 – 2014-05-10 (×4): 50 mg via ORAL
  Filled 2014-05-07 (×4): qty 1

## 2014-05-07 MED ORDER — ALBUTEROL SULFATE (2.5 MG/3ML) 0.083% IN NEBU: 2.5000 mg | INHALATION_SOLUTION | Freq: Four times a day (QID) | RESPIRATORY_TRACT | Status: DC | PRN

## 2014-05-07 MED ORDER — DIGOXIN 0.25 MG/ML IJ SOLN
0.1250 mg | Freq: Once | INTRAMUSCULAR | Status: AC
Start: 2014-05-07 — End: 2014-05-07
  Administered 2014-05-07: 0.125 mg via INTRAVENOUS
  Filled 2014-05-07: qty 2

## 2014-05-07 MED ORDER — ZOLPIDEM TARTRATE 5 MG PO TABS: 5.0000 mg | ORAL_TABLET | Freq: Every evening | ORAL | Status: DC | PRN

## 2014-05-07 MED ORDER — TIOTROPIUM BROMIDE MONOHYDRATE 18 MCG IN CAPS
18.0000 ug | ORAL_CAPSULE | Freq: Every day | RESPIRATORY_TRACT | Status: DC
  Administered 2014-05-07 – 2014-05-10 (×4): 18 ug via RESPIRATORY_TRACT
  Filled 2014-05-07: qty 5

## 2014-05-07 NOTE — ED Notes (Signed)
Pt placed in precautions for SI. He stated that he felt hapless but that "the other nurse did not hear" him "right", pt states he was not making a plan to kill himself, he "just doesn't feel like he has any hope anymore".  Lasix given and foley inserted per verbal order.

## 2014-05-07 NOTE — Progress Notes (Signed)
Subjective: He says he feels much better. He had chest pain and his troponin level did go up and he had what appeared to be acute congestive heart failure. He has been depressed. He tells me that although he was depressed he would not harm himself because he wants to see his wife who passed away about 9 years ago in the afterlife. He says he is hungry. He still has itching.  Objective: Vital signs in last 24 hours: Temp:  [97.5 F (36.4 C)-97.9 F (36.6 C)] 97.6 F (36.4 C) (01/30 0821) Pulse Rate:  [106-128] 128 (01/30 0700) Resp:  [18-25] 20 (01/30 0700) BP: (126-153)/(63-103) 153/76 mmHg (01/30 0700) SpO2:  [95 %-99 %] 97 % (01/30 0700) Weight:  [104.327 kg (230 lb)-112 kg (246 lb 14.6 oz)] 112 kg (246 lb 14.6 oz) (01/30 0800) Weight change:  Last BM Date: 05/03/14  Intake/Output from previous day: 01/29 0701 - 01/30 0700 In: 100 [IV Piggyback:100] Out: 3420 [Urine:3420]  PHYSICAL EXAM General appearance: alert, cooperative and mild distress Resp: rales bilaterally and rhonchi bilaterally Cardio: regular rate and rhythm, S1, S2 normal, no murmur, click, rub or gallop GI: soft, non-tender; bowel sounds normal; no masses,  no organomegaly Extremities: extremities normal, atraumatic, no cyanosis or edema  Lab Results:  Results for orders placed or performed during the hospital encounter of 05/06/14 (from the past 48 hour(s))  CBC with Differential/Platelet     Status: Abnormal   Collection Time: 05/06/14 11:55 PM  Result Value Ref Range   WBC 11.6 (H) 4.0 - 10.5 K/uL   RBC 4.29 4.22 - 5.81 MIL/uL   Hemoglobin 12.6 (L) 13.0 - 17.0 g/dL   HCT 38.6 (L) 39.0 - 52.0 %   MCV 90.0 78.0 - 100.0 fL   MCH 29.4 26.0 - 34.0 pg   MCHC 32.6 30.0 - 36.0 g/dL   RDW 13.7 11.5 - 15.5 %   Platelets 330 150 - 400 K/uL   Neutrophils Relative % 89 (H) 43 - 77 %   Neutro Abs 10.3 (H) 1.7 - 7.7 K/uL   Lymphocytes Relative 6 (L) 12 - 46 %   Lymphs Abs 0.7 0.7 - 4.0 K/uL   Monocytes Relative 5  3 - 12 %   Monocytes Absolute 0.6 0.1 - 1.0 K/uL   Eosinophils Relative 0 0 - 5 %   Eosinophils Absolute 0.0 0.0 - 0.7 K/uL   Basophils Relative 0 0 - 1 %   Basophils Absolute 0.0 0.0 - 0.1 K/uL  Comprehensive metabolic panel     Status: Abnormal   Collection Time: 05/06/14 11:55 PM  Result Value Ref Range   Sodium 132 (L) 135 - 145 mmol/L   Potassium 4.2 3.5 - 5.1 mmol/L   Chloride 97 96 - 112 mmol/L   CO2 27 19 - 32 mmol/L   Glucose, Bld 360 (H) 70 - 99 mg/dL   BUN 16 6 - 23 mg/dL   Creatinine, Ser 0.87 0.50 - 1.35 mg/dL   Calcium 8.7 8.4 - 10.5 mg/dL   Total Protein 6.7 6.0 - 8.3 g/dL   Albumin 3.4 (L) 3.5 - 5.2 g/dL   AST 10 0 - 37 U/L   ALT 11 0 - 53 U/L   Alkaline Phosphatase 72 39 - 117 U/L   Total Bilirubin 0.8 0.3 - 1.2 mg/dL   GFR calc non Af Amer 89 (L) >90 mL/min   GFR calc Af Amer >90 >90 mL/min    Comment: (NOTE) The eGFR has been calculated  using the CKD EPI equation. This calculation has not been validated in all clinical situations. eGFR's persistently <90 mL/min signify possible Chronic Kidney Disease.    Anion gap 8 5 - 15  Brain natriuretic peptide     Status: Abnormal   Collection Time: 05/06/14 11:55 PM  Result Value Ref Range   B Natriuretic Peptide 646.0 (H) 0.0 - 100.0 pg/mL  Troponin I     Status: Abnormal   Collection Time: 05/06/14 11:55 PM  Result Value Ref Range   Troponin I 0.11 (H) <0.031 ng/mL    Comment:        PERSISTENTLY INCREASED TROPONIN VALUES IN THE RANGE OF 0.04-0.49 ng/mL CAN BE SEEN IN:       -UNSTABLE ANGINA       -CONGESTIVE HEART FAILURE       -MYOCARDITIS       -CHEST TRAUMA       -ARRYHTHMIAS       -LATE PRESENTING MYOCARDIAL INFARCTION       -COPD   CLINICAL FOLLOW-UP RECOMMENDED.   Urinalysis, Routine w reflex microscopic     Status: Abnormal   Collection Time: 05/07/14  1:20 AM  Result Value Ref Range   Color, Urine YELLOW YELLOW   APPearance CLEAR CLEAR   Specific Gravity, Urine 1.015 1.005 - 1.030   pH  6.5 5.0 - 8.0   Glucose, UA >1000 (A) NEGATIVE mg/dL   Hgb urine dipstick TRACE (A) NEGATIVE   Bilirubin Urine NEGATIVE NEGATIVE   Ketones, ur TRACE (A) NEGATIVE mg/dL   Protein, ur 30 (A) NEGATIVE mg/dL   Urobilinogen, UA 0.2 0.0 - 1.0 mg/dL   Nitrite NEGATIVE NEGATIVE   Leukocytes, UA NEGATIVE NEGATIVE  Urine microscopic-add on     Status: None   Collection Time: 05/07/14  1:20 AM  Result Value Ref Range   Squamous Epithelial / LPF RARE RARE   WBC, UA 0-2 <3 WBC/hpf   RBC / HPF 0-2 <3 RBC/hpf   Bacteria, UA RARE RARE  MRSA PCR Screening     Status: None   Collection Time: 05/07/14  4:25 AM  Result Value Ref Range   MRSA by PCR NEGATIVE NEGATIVE    Comment:        The GeneXpert MRSA Assay (FDA approved for NASAL specimens only), is one component of a comprehensive MRSA colonization surveillance program. It is not intended to diagnose MRSA infection nor to guide or monitor treatment for MRSA infections.   Troponin I (q 6hr x 3)     Status: Abnormal   Collection Time: 05/07/14  4:59 AM  Result Value Ref Range   Troponin I 0.09 (H) <0.031 ng/mL    Comment:        PERSISTENTLY INCREASED TROPONIN VALUES IN THE RANGE OF 0.04-0.49 ng/mL CAN BE SEEN IN:       -UNSTABLE ANGINA       -CONGESTIVE HEART FAILURE       -MYOCARDITIS       -CHEST TRAUMA       -ARRYHTHMIAS       -LATE PRESENTING MYOCARDIAL INFARCTION       -COPD   CLINICAL FOLLOW-UP RECOMMENDED.   D-dimer, quantitative     Status: Abnormal   Collection Time: 05/07/14  4:59 AM  Result Value Ref Range   D-Dimer, Quant 19.97 (H) 0.00 - 0.48 ug/mL-FEU    Comment:        AT THE INHOUSE ESTABLISHED CUTOFF VALUE OF 0.48 ug/mL FEU, THIS ASSAY  HAS BEEN DOCUMENTED IN THE LITERATURE TO HAVE A SENSITIVITY AND NEGATIVE PREDICTIVE VALUE OF AT LEAST 98 TO 99%.  THE TEST RESULT SHOULD BE CORRELATED WITH AN ASSESSMENT OF THE CLINICAL PROBABILITY OF DVT / VTE.   Glucose, capillary     Status: Abnormal   Collection  Time: 05/07/14  5:57 AM  Result Value Ref Range   Glucose-Capillary 390 (H) 70 - 99 mg/dL   Comment 1 Notify RN    Comment 2 Repeat Test   Lipid panel     Status: Abnormal   Collection Time: 05/07/14  7:12 AM  Result Value Ref Range   Cholesterol 278 (H) 0 - 200 mg/dL   Triglycerides 99 <150 mg/dL   HDL 43 >39 mg/dL   Total CHOL/HDL Ratio 6.5 RATIO   VLDL 20 0 - 40 mg/dL   LDL Cholesterol 215 (H) 0 - 99 mg/dL    Comment:        Total Cholesterol/HDL:CHD Risk Coronary Heart Disease Risk Table                     Men   Women  1/2 Average Risk   3.4   3.3  Average Risk       5.0   4.4  2 X Average Risk   9.6   7.1  3 X Average Risk  23.4   11.0        Use the calculated Patient Ratio above and the CHD Risk Table to determine the patient's CHD Risk.        ATP III CLASSIFICATION (LDL):  <100     mg/dL   Optimal  100-129  mg/dL   Near or Above                    Optimal  130-159  mg/dL   Borderline  160-189  mg/dL   High  >190     mg/dL   Very High   CBC with Differential/Platelet     Status: Abnormal   Collection Time: 05/07/14  7:12 AM  Result Value Ref Range   WBC 10.2 4.0 - 10.5 K/uL   RBC 4.22 4.22 - 5.81 MIL/uL   Hemoglobin 12.7 (L) 13.0 - 17.0 g/dL   HCT 37.7 (L) 39.0 - 52.0 %   MCV 89.3 78.0 - 100.0 fL   MCH 30.1 26.0 - 34.0 pg   MCHC 33.7 30.0 - 36.0 g/dL   RDW 13.5 11.5 - 15.5 %   Platelets 265 150 - 400 K/uL   Neutrophils Relative % 95 (H) 43 - 77 %   Neutro Abs 9.7 (H) 1.7 - 7.7 K/uL   Lymphocytes Relative 4 (L) 12 - 46 %   Lymphs Abs 0.4 (L) 0.7 - 4.0 K/uL   Monocytes Relative 1 (L) 3 - 12 %   Monocytes Absolute 0.1 0.1 - 1.0 K/uL   Eosinophils Relative 0 0 - 5 %   Eosinophils Absolute 0.0 0.0 - 0.7 K/uL   Basophils Relative 0 0 - 1 %   Basophils Absolute 0.0 0.0 - 0.1 K/uL  Comprehensive metabolic panel     Status: Abnormal   Collection Time: 05/07/14  7:12 AM  Result Value Ref Range   Sodium 131 (L) 135 - 145 mmol/L   Potassium 3.7 3.5 - 5.1  mmol/L   Chloride 93 (L) 96 - 112 mmol/L   CO2 28 19 - 32 mmol/L   Glucose, Bld 358 (H) 70 - 99  mg/dL   BUN 17 6 - 23 mg/dL   Creatinine, Ser 1.02 0.50 - 1.35 mg/dL   Calcium 9.0 8.4 - 10.5 mg/dL   Total Protein 6.6 6.0 - 8.3 g/dL   Albumin 3.4 (L) 3.5 - 5.2 g/dL   AST 12 0 - 37 U/L   ALT 11 0 - 53 U/L   Alkaline Phosphatase 62 39 - 117 U/L   Total Bilirubin 0.8 0.3 - 1.2 mg/dL   GFR calc non Af Amer 75 (L) >90 mL/min   GFR calc Af Amer 87 (L) >90 mL/min    Comment: (NOTE) The eGFR has been calculated using the CKD EPI equation. This calculation has not been validated in all clinical situations. eGFR's persistently <90 mL/min signify possible Chronic Kidney Disease.    Anion gap 10 5 - 15  TSH     Status: None   Collection Time: 05/07/14  7:12 AM  Result Value Ref Range   TSH 0.656 0.350 - 4.500 uIU/mL  Glucose, capillary     Status: Abnormal   Collection Time: 05/07/14  8:16 AM  Result Value Ref Range   Glucose-Capillary 300 (H) 70 - 99 mg/dL   Comment 1 Documented in Chart    Comment 2 Notify RN     ABGS No results for input(s): PHART, PO2ART, TCO2, HCO3 in the last 72 hours.  Invalid input(s): PCO2 CULTURES Recent Results (from the past 240 hour(s))  MRSA PCR Screening     Status: None   Collection Time: 05/07/14  4:25 AM  Result Value Ref Range Status   MRSA by PCR NEGATIVE NEGATIVE Final    Comment:        The GeneXpert MRSA Assay (FDA approved for NASAL specimens only), is one component of a comprehensive MRSA colonization surveillance program. It is not intended to diagnose MRSA infection nor to guide or monitor treatment for MRSA infections.    Studies/Results: Dg Chest Port 1 View  05/07/2014   CLINICAL DATA:  Mid sternal chest pain  EXAM: PORTABLE CHEST - 1 VIEW  COMPARISON:  03/05/2014  FINDINGS: There are diffuse interstitial and alveolar airspace opacities. Prominence of the central pulmonary vasculature. Small bilateral pleural effusions. No  pneumothorax. Stable cardiomegaly. Prior CABG.  IMPRESSION: Moderate CHF.   Electronically Signed   By: Kathreen Devoid   On: 05/07/2014 00:23    Medications:  Prior to Admission:  Prescriptions prior to admission  Medication Sig Dispense Refill Last Dose  . Aspirin-Salicylamide-Caffeine (BC FAST PAIN RELIEF ARTHRITIS) [phone] MG PACK Take 1 packet by mouth 3 (three) times daily as needed (for pain).   Taking  . atorvastatin (LIPITOR) 80 MG tablet Take 1 tablet (80 mg total) by mouth daily. 90 tablet 3 Taking  . carvedilol (COREG) 6.25 MG tablet Take 1 tablet (6.25 mg total) by mouth 2 (two) times daily. 180 tablet 3   . clopidogrel (PLAVIX) 75 MG tablet Take 1 tablet (75 mg total) by mouth daily. 90 tablet 3   . digoxin (LANOXIN) 0.25 MG tablet Take 1 tablet (0.25 mg total) by mouth daily. 30 tablet 12 Taking  . furosemide (LASIX) 80 MG tablet Take 80 mg by mouth 2 (two) times daily.   Taking  . insulin NPH Human (HUMULIN N,NOVOLIN N) 100 UNIT/ML injection Inject 0.75 mLs (75 Units total) into the skin 2 (two) times daily. 10 mL 11 Taking  . isosorbide mononitrate (IMDUR) 60 MG 24 hr tablet Take 1 tablet (60 mg total) by mouth daily. [place]  tablet 3   . KLOR-CON M20 20 MEQ tablet Take 20 mEq by mouth daily.    Taking  . lisinopril (PRINIVIL,ZESTRIL) 2.5 MG tablet TAKE ONE TABLET BY MOUTH ONCE DAILY 30 tablet 6 Taking  . nitroGLYCERIN (NITROSTAT) 0.4 MG SL tablet Place 1 tablet (0.4 mg total) under the tongue every 5 (five) minutes as needed for chest pain. 25 tablet 3   . oxyCODONE-acetaminophen (PERCOCET) 10-325 MG per tablet Take 1 tablet by mouth every 4 (four) hours as needed for pain.   Taking  . ranolazine (RANEXA) 500 MG 12 hr tablet Take 1 tablet (500 mg total) by mouth 2 (two) times daily. 60 tablet 12 Taking  . sulfamethoxazole-trimethoprim (BACTRIM DS,SEPTRA DS) 800-160 MG per tablet Take 1 tablet by mouth 2 (two) times daily.   Taking  . vitamin B-12 (CYANOCOBALAMIN) 1000 MCG tablet  Take 1,000 mcg by mouth daily.   Taking  . vitamin C (ASCORBIC ACID) 500 MG tablet Take 1,000 mg by mouth at bedtime.   Taking  . vitamin E 400 UNIT capsule Take 400 Units by mouth at bedtime.    Taking   Scheduled: . antiseptic oral rinse  7 mL Mouth Rinse BID  . atorvastatin  80 mg Oral Daily  . carvedilol  6.25 mg Oral BID  . clopidogrel  75 mg Oral Daily  . digoxin  0.25 mg Oral Daily  . docusate sodium  100 mg Oral BID  . furosemide  80 mg Intravenous BID  . insulin aspart  0-20 Units Subcutaneous TID WC  . insulin aspart  0-5 Units Subcutaneous QHS  . levofloxacin (LEVAQUIN) IV  500 mg Intravenous Q24H  . lisinopril  2.5 mg Oral Daily  . nitroGLYCERIN  1 inch Topical 3 times per day  . potassium chloride SA  20 mEq Oral Daily  . predniSONE  60 mg Oral Q breakfast  . ranolazine  500 mg Oral BID  . sertraline  50 mg Oral Daily  . sodium chloride  3 mL Intravenous Q12H  . tiotropium  18 mcg Inhalation Daily  . vitamin B-12  1,000 mcg Oral Daily  . vitamin C  1,000 mg Oral QHS  . vitamin E  400 Units Oral QHS   Continuous:  BPZ:WCHENIDPO, ALPRAZolam, diphenhydrAMINE, hydrocortisone cream, nitroGLYCERIN, oxyCODONE-acetaminophen, zolpidem  Assesment: He is admitted with exacerbation of CHF. He has elevated troponins which I think are related to his CHF exacerbation. 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 He has chronic pain syndrome and that's pretty stable. He has depression. This is the first time that he is mention that to me so he needs to be started on treatment. I do not believe he is suicidal but I will obtain psychiatry consultation to be sure. He has diabetes. He says he is hungry and I'm going to get him started on a heart healthy diet Active Problems:   Diabetes mellitus   Chest pain   CHF exacerbation   Tachycardia    Plan: Add Benadryl for itching. The itching is related to a inflammation from an adult diaper. Continue with Lasix. Continue monitoring. Psychiatry  consult. Add Colace for constipation. Add Zoloft and Xanax for anxiety and depression. Advance his diet    LOS: 1 day   Tinika Bucknam L 05/07/2014, 10:23 AM
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
## 2014-05-07 NOTE — Progress Notes (Signed)
Echocardiogram 2D Echocardiogram has been performed.  Estelle GrumblesMyers, Chyla Schlender J 05/07/2014, 11:14 AM

## 2014-05-07 NOTE — ED Provider Notes (Signed)
CSN: [id number]     Arrival date & time 05/06/14  2328 History   First MD Initiated Contact with Patient 05/06/14 2353     Chief Complaint  Patient presents with  . Shortness of Breath     (Consider location/radiation/quality/duration/timing/severity/associated sxs/prior Treatment) HPI Patient with a history of COPD and chronic shortness of breath on 3 L of home O2 presents with increased shortness of breath starting today. He's had a mild cough but this is unchanged from his baseline. There's been no sputum production. He admits to chills but he states this is chronic. He's had no lower extremity swelling or pain. Shortness of breath is not worse with position change. He denies any chest pain. Patient has chronic pain and admitted to the nurse that his nerves are on edge and he feels like he wants to end it all. Is not currently have a plan for suicide at this time. Past Medical History  Diagnosis Date  . Uncontrolled diabetes mellitus     a. A1C 12.8 in 01/2013.  [name] Chronic systolic heart failure   . Obesity   . Hyperlipidemia   . Peripheral vascular disease   . Hypertension   . Asthma   . CAD (coronary artery disease)     a. Prior CABG 20 yrs ago (?~1994). b. Hx of multiple stents, prev followed in [place]. c. [place] 01/2013: cath moderate disease in LAD, distal LCx, prox RCA occluded, 2 SVGs occluded, felt to be stable from prior cath -> for medical therapy.  . Ischemic cardiomyopathy     a. EF 2012: 55-60. b. EF 12/2011: 25-30%. c. Echo 01/2013: EF 15-20%.  . Stroke     15 years ago  . Arthritis   . Anxiety   . Depression   . 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 Chronic respiratory failure     a. Due to COPD.  . Osteomyelitis 2013    was planned for chronic suppressive antibiotics but cannot afford this any longer  . DNR no code (do not resuscitate) 07/2013  . CHF (congestive heart failure)   . Diabetes mellitus without  complication    Past Surgical History  Procedure Laterality Date  . Coronary artery bypass graft    . Coronary stent placement  2008    CABG grafts closed  . Joint replacement    . Cholecystectomy    . Eye surgery    . I&d extremity  06/12/2011    Procedure: IRRIGATION AND DEBRIDEMENT EXTREMITY;  Surgeon: Javier Docker, MD;  Location: [facility] OR;  Service: Orthopedics;  Laterality: Right;  I&D Right Tibia  . Left and right heart catheterization with coronary/graft angiogram N/A 01/15/2013    Procedure: LEFT AND RIGHT HEART CATHETERIZATION WITH Isabel Caprice;  Surgeon: Alvia Grove, MD;  [facility] C[facility] LAB;  Service: Cardiovascular;  Laterality: N/A;   Family History  Problem Relation Age of Onset  . Dementia Mother   . Arthritis Mother   . Heart disease Father   . Heart disease Brother    History  Substance Use Topics  . Smoking status: Former Smoker -- 3.00 packs/day    Start date: 04/08/1957    Quit date: 04/09/1983  . Smokeless tobacco: Never Used  . Alcohol Use: No    Review of Systems  Constitutional: Positive for chills. Negative for fever.  Respiratory: Positive for cough, shortness of breath and wheezing.   Cardiovascular:  Negative for chest pain, palpitations and leg swelling.  Gastrointestinal: Negative for nausea, vomiting and abdominal pain.  Musculoskeletal: Negative for back pain, neck pain and neck stiffness.  Skin: Negative for rash and wound.  Neurological: Negative for dizziness, weakness, numbness and headaches.  Psychiatric/Behavioral: Positive for suicidal ideas.  All other systems reviewed and are negative.     Allergies  Baycol and Stadol  Home Medications   Prior to Admission medications   Medication Sig Start Date End Date Taking? Authorizing Provider  Aspirin-Salicylamide-Caffeine (BC FAST PAIN RELIEF ARTHRITIS) [id number] MG PACK Take 1 packet by mouth 3 (three) times daily as needed (for pain).    Historical Provider, MD   atorvastatin (LIPITOR) 80 MG tablet Take 1 tablet (80 mg total) by mouth daily. 03/11/14   Antoine Poche, MD  carvedilol (COREG) 6.25 MG tablet Take 1 tablet (6.25 mg total) by mouth 2 (two) times daily. 04/25/14   Jodelle Gross, NP  clopidogrel (PLAVIX) 75 MG tablet Take 1 tablet (75 mg total) by mouth daily. 04/25/14   Jodelle Gross, NP  digoxin (LANOXIN) 0.25 MG tablet Take 1 tablet (0.25 mg total) by mouth daily. 04/22/13   Fredirick Maudlin, MD  furosemide (LASIX) 80 MG tablet Take 80 mg by mouth 2 (two) times daily.    Historical Provider, MD  insulin NPH Human (HUMULIN N,NOVOLIN N) 100 UNIT/ML injection Inject 0.75 mLs (75 Units total) into the skin 2 (two) times daily. 03/01/14   Fredirick Maudlin, MD  isosorbide mononitrate (IMDUR) 60 MG 24 hr tablet Take 1 tablet (60 mg total) by mouth daily. 04/25/14   Jodelle Gross, NP  KLOR-CON M20 20 MEQ tablet Take 20 mEq by mouth daily.  01/07/13   Historical Provider, MD  lisinopril (PRINIVIL,ZESTRIL) 2.5 MG tablet TAKE ONE TABLET BY MOUTH ONCE DAILY 03/11/14   Antoine Poche, MD  nitroGLYCERIN (NITROSTAT) 0.4 MG SL tablet Place 1 tablet (0.4 mg total) under the tongue every 5 (five) minutes as needed for chest pain. 04/25/14   Jodelle Gross, NP  oxyCODONE-acetaminophen (PERCOCET) 10-325 MG per tablet Take 1 tablet by mouth every 4 (four) hours as needed for pain.    Historical Provider, MD  ranolazine (RANEXA) 500 MG 12 hr tablet Take 1 tablet (500 mg total) by mouth 2 (two) times daily. 03/01/14   Fredirick Maudlin, MD  sulfamethoxazole-trimethoprim (BACTRIM DS,SEPTRA DS) 800-160 MG per tablet Take 1 tablet by mouth 2 (two) times daily.    Historical Provider, MD  vitamin B-12 (CYANOCOBALAMIN) 1000 MCG tablet Take 1,000 mcg by mouth daily.    Historical Provider, MD  vitamin C (ASCORBIC ACID) 500 MG tablet Take 1,000 mg by mouth at bedtime.    Historical Provider, MD  vitamin E 400 UNIT capsule Take 400 Units by mouth at bedtime.      Historical Provider, MD   BP 142/103 mmHg  Pulse 115  Temp(Src) 97.5 F (36.4 C) (Oral)  Resp 24  Ht  (1.905 m)  Wt 230 lb (104.327 kg)  BMI 28.75 kg/m2  SpO2 98% Physical Exam  Constitutional: He is oriented to person, place, and time. He appears well-developed and well-nourished. No distress.  HENT:  Head: Normocephalic and atraumatic.  Mouth/Throat: Oropharynx is clear and moist.  Eyes: EOM are normal. Pupils are equal, round, and reactive to light.  Neck: Normal range of motion. Neck supple.  Cardiovascular: Regular rhythm.   Tachycardia  Pulmonary/Chest: Effort normal. No respiratory distress. He has  wheezes (diffuse expiratory wheezing throughout). He has no rales.    Respiratory distress  Abdominal: Soft. Bowel sounds are normal. He exhibits no distension. There is no tenderness. There is no rebound and no guarding.  Musculoskeletal: Normal range of motion. He exhibits no edema or tenderness.  No lower extremity swelling or tenderness. Distal pulses intact.  Neurological: He is alert and oriented to person, place, and time.  Moves all extremities without deficit. Sensation is grossly intact.  Skin: Skin is warm and dry. No rash noted. No erythema.  Psychiatric:  Mildly dysphoric mood  Nursing note and vitals reviewed.   ED Course  Procedures (including critical care time) Labs Review Labs Reviewed  CBC WITH DIFFERENTIAL/PLATELET - Abnormal; Notable for the following:    WBC 11.6 (*)    Hemoglobin 12.6 (*)    HCT 38.6 (*)    Neutrophils Relative % 89 (*)    Neutro Abs 10.3 (*)    Lymphocytes Relative 6 (*)    All other components within normal limits  COMPREHENSIVE METABOLIC PANEL - Abnormal; Notable for the following:    Sodium 132 (*)    Glucose, Bld 360 (*)    Albumin 3.4 (*)    GFR calc non Af Amer 89 (*)    All other components within normal limits  BRAIN NATRIURETIC PEPTIDE - Abnormal; Notable for the following:    B Natriuretic Peptide 646.0  (*)    All other components within normal limits  TROPONIN I - Abnormal; Notable for the following:    Troponin I 0.11 (*)    All other components within normal limits  URINE CULTURE  URINALYSIS, ROUTINE W REFLEX MICROSCOPIC    Imaging Review Dg Chest Port 1 View  05/07/2014   CLINICAL DATA:  Mid sternal chest pain  EXAM: PORTABLE CHEST - 1 VIEW  COMPARISON:  03/05/2014  FINDINGS: There are diffuse interstitial and alveolar airspace opacities. Prominence of the central pulmonary vasculature. Small bilateral pleural effusions. No pneumothorax. Stable cardiomegaly. Prior CABG.  IMPRESSION: Moderate CHF.   Electronically Signed   By: Elige KoHetal  Patel   On: 05/07/2014 00:23     EKG Interpretation   Date/Time:  Saturday May 07 2014 00:00:12 EST Ventricular Rate:  115 PR Interval:  150 QRS Duration: 108 QT Interval:  347 QTC Calculation: 480 R Axis:   43 Text Interpretation:  Sinus tachycardia Atrial premature complex Probable  left atrial enlargement Anterior infarct, old Abnormal T, consider  ischemia, lateral leads Baseline wander in lead(s) V3 Confirmed by  Ranae PalmsYELVERTON  MD, Ranessa Kosta ([id number]) on 05/07/2014 12:29:05 AM Also confirmed by  Ranae PalmsYELVERTON  MD, Ranyia Witting ([id number])  on 05/07/2014 1:39:06 AM      MDM   Final diagnoses:  SOB (shortness of breath)  CHF exacerbation  COPD exacerbation   Patient with mixed picture calls for shortness of breath. Likely COPD exacerbation and CHF. Began also to complain of central chest pain that he felt was due to his nerves. I discussed with [facility] hospitalists and will admit.     Loren Raceravid Markavious Micco, MD 05/07/14 [phone]

## 2014-05-07 NOTE — H&P (Signed)
Timothy Trujillo is an 64 y.o. male.    Dr. Luan Pulling (pcp)  Chief Complaint:  Sob, chest pain HPI: 64 yo male with hx of CAD s/p CABG, CHF (EF 15-20%), Copd on home o2, c/o chest pain sscp with radiation to the left shoulder,  "pressure and dull pain",  Starting at about 7am this am.  Pt states he has chronic sob.  Which was worse today along with the chest pain.  Nothing appeared to make the pain better or worse.  Pt therefore presented to ED and was found to have CHF on CXR.  Trop sligthly elevated at 0.11.  Pt noted to be tachycardic which he says is ? Chronic.  Pt will be admitted for w/up of dyspnea related to CHF, and also chest pain.   Past Medical History  Diagnosis Date  . Uncontrolled diabetes mellitus     a. A1C 12.8 in 01/2013.  [name] Chronic systolic heart failure   . Obesity   . Hyperlipidemia   . Peripheral vascular disease   . Hypertension   . Asthma   . CAD (coronary artery disease)     a. Prior CABG 20 yrs ago (?~1994). b. Hx of multiple stents, prev followed in [place]. c. [place] 01/2013: cath moderate disease in LAD, distal LCx, prox RCA occluded, 2 SVGs occluded, felt to be stable from prior cath -> for medical therapy.  . Ischemic cardiomyopathy     a. EF 2012: 55-60. b. EF 12/2011: 25-30%. c. Echo 01/2013: EF 15-20%.  . Stroke     15 years ago  . Arthritis   . Anxiety   . Depression   . 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 Chronic respiratory failure     a. Due to COPD.  . Osteomyelitis 2013    was planned for chronic suppressive antibiotics but cannot afford this any longer  . DNR no code (do not resuscitate) 07/2013  . CHF (congestive heart failure)   . Diabetes mellitus without complication     Past Surgical History  Procedure Laterality Date  . Coronary artery bypass graft    . Coronary stent placement  2008    CABG grafts closed  . Joint replacement    . Cholecystectomy    . Eye surgery    . I&d  extremity  06/12/2011    Procedure: IRRIGATION AND DEBRIDEMENT EXTREMITY;  Surgeon: Johnn Hai, MD;  Location: [place];  Service: Orthopedics;  Laterality: Right;  I&D Right Tibia  . Left and right heart catheterization with coronary/graft angiogram N/A 01/15/2013    Procedure: LEFT AND RIGHT HEART CATHETERIZATION WITH Beatrix Fetters;  Surgeon: Ramond Dial, MD;  Location: [facility] CATH LAB;  Service: Cardiovascular;  Laterality: N/A;    Family History  Problem Relation Age of Onset  . Dementia Mother   . Arthritis Mother   . Heart disease Father   . Heart disease Brother    Social History:  reports that he quit smoking about 31 years ago. He started smoking about 57 years ago. He has never used smokeless tobacco. He reports that he does not drink alcohol or use illicit drugs.  Allergies:  Allergies  Allergen Reactions  . Baycol [Cerivastatin]   . Stadol [Butorphanol Tartrate] Other (See Comments)    "makes him crazy"     (Not in a hospital admission)  Results for orders placed or performed during the hospital encounter  of 05/06/14 (from the past 48 hour(s))  CBC with Differential/Platelet     Status: Abnormal   Collection Time: 05/06/14 11:55 PM  Result Value Ref Range   WBC 11.6 (H) 4.0 - 10.5 K/uL   RBC 4.29 4.22 - 5.81 MIL/uL   Hemoglobin 12.6 (L) 13.0 - 17.0 g/dL   HCT 38.6 (L) 39.0 - 52.0 %   MCV 90.0 78.0 - 100.0 fL   MCH 29.4 26.0 - 34.0 pg   MCHC 32.6 30.0 - 36.0 g/dL   RDW 13.7 11.5 - 15.5 %   Platelets 330 150 - 400 K/uL   Neutrophils Relative % 89 (H) 43 - 77 %   Neutro Abs 10.3 (H) 1.7 - 7.7 K/uL   Lymphocytes Relative 6 (L) 12 - 46 %   Lymphs Abs 0.7 0.7 - 4.0 K/uL   Monocytes Relative 5 3 - 12 %   Monocytes Absolute 0.6 0.1 - 1.0 K/uL   Eosinophils Relative 0 0 - 5 %   Eosinophils Absolute 0.0 0.0 - 0.7 K/uL   Basophils Relative 0 0 - 1 %   Basophils Absolute 0.0 0.0 - 0.1 K/uL  Comprehensive metabolic panel     Status: Abnormal   Collection  Time: 05/06/14 11:55 PM  Result Value Ref Range   Sodium 132 (L) 135 - 145 mmol/L   Potassium 4.2 3.5 - 5.1 mmol/L   Chloride 97 96 - 112 mmol/L   CO2 27 19 - 32 mmol/L   Glucose, Bld 360 (H) 70 - 99 mg/dL   BUN 16 6 - 23 mg/dL   Creatinine, Ser 0.87 0.50 - 1.35 mg/dL   Calcium 8.7 8.4 - 10.5 mg/dL   Total Protein 6.7 6.0 - 8.3 g/dL   Albumin 3.4 (L) 3.5 - 5.2 g/dL   AST 10 0 - 37 U/L   ALT 11 0 - 53 U/L   Alkaline Phosphatase 72 39 - 117 U/L   Total Bilirubin 0.8 0.3 - 1.2 mg/dL   GFR calc non Af Amer 89 (L) >90 mL/min   GFR calc Af Amer >90 >90 mL/min    Comment: (NOTE) The eGFR has been calculated using the CKD EPI equation. This calculation has not been validated in all clinical situations. eGFR's persistently <90 mL/min signify possible Chronic Kidney Disease.    Anion gap 8 5 - 15  Brain natriuretic peptide     Status: Abnormal   Collection Time: 05/06/14 11:55 PM  Result Value Ref Range   B Natriuretic Peptide 646.0 (H) 0.0 - 100.0 pg/mL  Troponin I     Status: Abnormal   Collection Time: 05/06/14 11:55 PM  Result Value Ref Range   Troponin I 0.11 (H) <0.031 ng/mL    Comment:        PERSISTENTLY INCREASED TROPONIN VALUES IN THE RANGE OF 0.04-0.49 ng/mL CAN BE SEEN IN:       -UNSTABLE ANGINA       -CONGESTIVE HEART FAILURE       -MYOCARDITIS       -CHEST TRAUMA       -ARRYHTHMIAS       -LATE PRESENTING MYOCARDIAL INFARCTION       -COPD   CLINICAL FOLLOW-UP RECOMMENDED.   Urinalysis, Routine w reflex microscopic     Status: Abnormal   Collection Time: 05/07/14  1:20 AM  Result Value Ref Range   Color, Urine YELLOW YELLOW   APPearance CLEAR CLEAR   Specific Gravity, Urine 1.015 1.005 - 1.030  pH 6.5 5.0 - 8.0   Glucose, UA >1000 (A) NEGATIVE mg/dL   Hgb urine dipstick TRACE (A) NEGATIVE   Bilirubin Urine NEGATIVE NEGATIVE   Ketones, ur TRACE (A) NEGATIVE mg/dL   Protein, ur 30 (A) NEGATIVE mg/dL   Urobilinogen, UA 0.2 0.0 - 1.0 mg/dL   Nitrite NEGATIVE  NEGATIVE   Leukocytes, UA NEGATIVE NEGATIVE  Urine microscopic-add on     Status: None   Collection Time: 05/07/14  1:20 AM  Result Value Ref Range   Squamous Epithelial / LPF RARE RARE   WBC, UA 0-2 <3 WBC/hpf   RBC / HPF 0-2 <3 RBC/hpf   Bacteria, UA RARE RARE   Dg Chest Port 1 View  05/07/2014   CLINICAL DATA:  Mid sternal chest pain  EXAM: PORTABLE CHEST - 1 VIEW  COMPARISON:  03/05/2014  FINDINGS: There are diffuse interstitial and alveolar airspace opacities. Prominence of the central pulmonary vasculature. Small bilateral pleural effusions. No pneumothorax. Stable cardiomegaly. Prior CABG.  IMPRESSION: Moderate CHF.   Electronically Signed   By: Kathreen Devoid   On: 05/07/2014 00:23    Review of Systems  Constitutional: Negative for fever, chills, weight loss, malaise/fatigue and diaphoresis.  HENT: Negative for congestion, ear discharge, ear pain, hearing loss, nosebleeds, sore throat and tinnitus.   Eyes: Negative for blurred vision, double vision, photophobia, pain, discharge and redness.  Respiratory: Positive for cough, sputum production and shortness of breath. Negative for hemoptysis, wheezing and stridor.   Cardiovascular: Positive for chest pain. Negative for palpitations, orthopnea, claudication, leg swelling and PND.  Gastrointestinal: Positive for constipation. Negative for heartburn, nausea, vomiting, abdominal pain, diarrhea, blood in stool and melena.  Genitourinary: Negative for dysuria, urgency, frequency, hematuria and flank pain.  Musculoskeletal: Positive for joint pain. Negative for myalgias, back pain, falls and neck pain.  Skin: Negative for itching and rash.  Neurological: Negative for dizziness, tingling, tremors, sensory change, speech change, focal weakness, seizures, loss of consciousness, weakness and headaches.  Endo/Heme/Allergies: Negative for environmental allergies and polydipsia. Does not bruise/bleed easily.  Psychiatric/Behavioral: Positive for  depression. Negative for suicidal ideas and substance abuse.    Blood pressure 142/103, pulse 115, temperature 97.5 F (36.4 C), temperature source Oral, resp. rate 24, height _0  (1.905 m), weight 104.327 kg (230 lb), SpO2 98 %. Physical Exam  Constitutional: He is oriented to person, place, and time. He appears well-developed and well-nourished.  HENT:  Head: Normocephalic and atraumatic.  Eyes: Conjunctivae and EOM are normal. Pupils are equal, round, and reactive to light. No scleral icterus.  Neck: Normal range of motion. Neck supple. JVD present. No tracheal deviation present. No thyromegaly present.  Cardiovascular: Exam reveals no gallop and no friction rub.   Murmur heard. Tachy s1, s2  Respiratory: Effort normal. No respiratory distress. He has no wheezes. He has rales.  GI: Soft. Bowel sounds are normal. He exhibits no distension and no mass. There is no tenderness. There is no rebound and no guarding.  Musculoskeletal: Normal range of motion. He exhibits no edema or tenderness.  Lymphadenopathy:    He has no cervical adenopathy.  Neurological: He is alert and oriented to person, place, and time. He has normal reflexes. He displays normal reflexes. No cranial nerve deficit. He exhibits normal muscle tone. Coordination normal.  Skin: Skin is warm and dry. No rash noted. No erythema. No pallor.  Psychiatric: His behavior is normal. Judgment and thought content normal.  Pt depressed, but denies si/hi  Assessment/Plan Dyspnea secondary to CHF (EF 15-20%) Lasix iv Strict i and o Check daily weight Cycle cardiac markers Check tsh, check bnp Check cardiac echo Cont b blocker,  Start on ntp  Chest pain Tele Cycle cardiac markers Lovenox 15m/kg Cane Beds x1 Cont aspirin, statin  Tachycardia Check d dimer, if positive then CTA chest Check tsh  Copd on home o2 Cont current inhalers  Dm2 fsbs ac and qhs, iss  Hyponatremia Check cmp in am  Anemia Check cbc in  am   KDAILON, SHEERAN1/30/2016, 2:30 AM

## 2014-05-07 NOTE — Progress Notes (Signed)
INITIAL NUTRITION ASSESSMENT  DOCUMENTATION CODES Per approved criteria  -Not Applicable   INTERVENTION: Ordered Ensure supplement BID  NUTRITION DIAGNOSIS: Inadequate oral intake related to depression/loss of appetite as evidenced by eating 25-50% of meals and a 5% weight loss in past few months.   Goal: Pt to meet >/= 90% of their estimated nutrition needs    Monitor:  Oral intake, labs, i/os, weights  Reason for Assessment:MST of 5  64 y.o. male  Admitting Dx: <principal problem not specified>  ASSESSMENT: 64 yo male with hx of CAD s/p CABG, CHF (EF 15-20%), Copd on home o2, c/o chest pain sscp with radiation to the left shoulder  Looked at remotely. Pt appears to only be eat 25%-50% of meals and appears to have lost roughly 5% of body weight since September.   Nutrition Focused Physical Exam: N/A    Height: Ht Readings from Last 1 Encounters:  05/06/14  (1.905 m)    Weight: Wt Readings from Last 1 Encounters:  05/07/14 246 lb 14.6 oz (112 kg)    Ideal Body Weight: 196 lbs  % Ideal Body Weight: 125%  Wt Readings from Last 10 Encounters:  05/07/14 246 lb 14.6 oz (112 kg)  04/25/14 232 lb (105.235 kg)  03/21/14 242 lb (109.77 kg)  02/26/14 240 lb 9.6 oz (109.135 kg)  12/14/13 262 lb (118.842 kg)  09/10/13 252 lb (114.306 kg)  08/10/13 260 lb (117.935 kg)  08/01/13 265 lb 10.5 oz (120.5 kg)  07/05/13 254 lb (115.214 kg)  05/03/13 261 lb (118.389 kg)    Usual Body Weight: appears to ~160  % Usual Body Weight: ~95%  BMI:  Body mass index is 30.86 kg/(m^2).  Estimated Nutritional Needs: Kcal:1700-2250 Protein: 123-146  Skin: WDL  Diet Order: Diet Heart  EDUCATION NEEDS: -Education needs addressed   Intake/Output Summary (Last 24 hours) at 05/07/14 1539 Last data filed at 05/07/14 1453  Gross per 24 hour  Intake    580 ml  Output   4920 ml  Net  -4340 ml    Last BM: 1/26 constipated  Labs:   Recent Labs Lab 05/06/14 2355  05/07/14 0712  NA 132* 131*  K 4.2 3.7  CL 97 93*  CO2 27 28  BUN 16 17  CREATININE 0.87 1.02  CALCIUM 8.7 9.0  GLUCOSE 360* 358*    CBG (last 3)   Recent Labs  05/07/14 0557 05/07/14 0816 05/07/14 1140  GLUCAP 390* 300* 319*    Scheduled Meds: . antiseptic oral rinse  7 mL Mouth Rinse BID  . atorvastatin  80 mg Oral Daily  . carvedilol  6.25 mg Oral BID  . clopidogrel  75 mg Oral Daily  . digoxin  0.25 mg Oral Daily  . docusate sodium  100 mg Oral BID  . furosemide  80 mg Intravenous BID  . insulin aspart  0-20 Units Subcutaneous TID WC  . insulin aspart  0-5 Units Subcutaneous QHS  . levofloxacin (LEVAQUIN) IV  500 mg Intravenous Q24H  . lisinopril  2.5 mg Oral Daily  . nitroGLYCERIN  1 inch Topical 3 times per day  . potassium chloride SA  20 mEq Oral Daily  . predniSONE  60 mg Oral Q breakfast  . ranolazine  500 mg Oral BID  . sertraline  50 mg Oral Daily  . sodium chloride  3 mL Intravenous Q12H  . tiotropium  18 mcg Inhalation Daily  . vitamin B-12  1,000 mcg Oral Daily  . vitamin  C  1,000 mg Oral QHS  . vitamin E  400 Units Oral QHS    Continuous Infusions:   Past Medical History  Diagnosis Date  . Uncontrolled diabetes mellitus     a. A1C 12.8 in 01/2013.  [name]. Chronic systolic heart failure   . Obesity   . Hyperlipidemia   . Peripheral vascular disease   . Hypertension   . Asthma   . CAD (coronary artery disease)     a. Prior CABG 20 yrs ago (?~1994). b. [place]: cath moderate disease in LAD, distal LCx, prox RCA occluded, 2 SVGs occluded, felt to be stable from prior cath -> for medical therapy.  . Ischemic cardiomyopathy     a. EF 2012: 55-60. b. EF 12/2011: 25-30%. c. Echo 01/2013: EF 15-20%.  . Stroke     15 years ago  . Arthritis   . Anxiety   . Depression   . 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 Chronic respiratory failure      a. Due to COPD.  . Osteomyelitis 2013    was planned for chronic suppressive antibiotics but cannot afford this any longer  . DNR no code (do not resuscitate) 07/2013  . CHF (congestive heart failure)   . Diabetes mellitus without complication     Past Surgical History  Procedure Laterality Date  . Coronary artery bypass graft    . Coronary stent placement  2008    CABG grafts closed  . Joint replacement    . Cholecystectomy    . Eye surgery    . I&d extremity  06/12/2011    Procedure: IRRIGATION AND DEBRIDEMENT EXTREMITY;  Surgeon: Javier DockerJeffrey C Beane, MD;  Location: [facility] OR;  Service: Orthopedics;  Laterality: Right;  I&D Right Tibia  . Left and right heart catheterization with coronary/graft angiogram N/A 01/15/2013    Procedure: LEFT AND RIGHT HEART CATHETERIZATION WITH Isabel CapriceORONARY/GRAFT ANGIOGRAM;  Surgeon: Alvia GroveJr Philip J Nahser, MD;  [facility];  Service: Cardiovascular;  Laterality: N/A;    Christophe LouisNathan Rebbeca Sheperd RD, LDN Nutrition [phone] 05/07/2014 3:39 PM

## 2014-05-08 ENCOUNTER — Inpatient Hospital Stay (HOSPITAL_COMMUNITY): Payer: Commercial Managed Care - HMO

## 2014-05-08 LAB — GLUCOSE, CAPILLARY
GLUCOSE-CAPILLARY: 204 mg/dL — AB (ref 70–99)
GLUCOSE-CAPILLARY: 217 mg/dL — AB (ref 70–99)
GLUCOSE-CAPILLARY: 363 mg/dL — AB (ref 70–99)
GLUCOSE-CAPILLARY: 321 mg/dL — AB (ref 70–99)
Glucose-Capillary: 201 mg/dL — ABNORMAL HIGH (ref 70–99)
Glucose-Capillary: 283 mg/dL — ABNORMAL HIGH (ref 70–99)

## 2014-05-08 LAB — URINE CULTURE
Colony Count: NO GROWTH
Culture: NO GROWTH

## 2014-05-08 MED ORDER — IOHEXOL 350 MG/ML SOLN
100.0000 mL | Freq: Once | INTRAVENOUS | Status: AC | PRN
  Administered 2014-05-08: 100 mL via INTRAVENOUS

## 2014-05-08 MED ORDER — ISOSORBIDE MONONITRATE ER 60 MG PO TB24
60.0000 mg | ORAL_TABLET | Freq: Every day | ORAL | Status: DC
  Administered 2014-05-08 – 2014-05-10 (×3): 60 mg via ORAL
  Filled 2014-05-08 (×3): qty 1

## 2014-05-08 NOTE — Progress Notes (Signed)
Pt's personal pastor in to visit. It was previously documented that pt had suicidal thoughts and needed a hospital spiritual consult. After his pastor left he discussed with me that he would never harm himself or others because he [place] [facility] [place] [facility] [place] and if he does anything wrong or selfish, he won't be able to. Emotional support given. Pt currently has no suicidal thoughts or thoughts of harming others. I think the pt will still benefit consulting with the hospital chaplain.

## 2014-05-08 NOTE — Progress Notes (Signed)
Notified MD of telepsych results and recommendations, sitter at bedside is no longer needed per Dr. Juanetta GoslingHawkins

## 2014-05-08 NOTE — Progress Notes (Signed)
Pt transferring to unit 300 today. Pt/family is aware and agreeable to transfer. Assessment is unchanged from this morning and receiving RN has been given report. Belongings sent with pt at bedside.  

## 2014-05-08 NOTE — Progress Notes (Signed)
Subjective: He says he feels better. He has no chest pain. His breathing is better. He still has problems with itching. He had psychiatry consult by telemonitoring and is not felt to be a threat to himself and I concur  Objective: Vital signs in last 24 hours: Temp:  [97.7 F (36.5 C)-98.4 F (36.9 C)] 98.4 F (36.9 C) (01/31 0730) Pulse Rate:  [75-116] 101 (01/31 0822) Resp:  [13-24] 15 (01/31 0700) BP: (113-149)/(59-117) 113/67 mmHg (01/31 0700) SpO2:  [95 %-99 %] 97 % (01/31 0730) Weight:  [101.8 kg (224 lb 6.9 oz)] 101.8 kg (224 lb 6.9 oz) (01/31 0500) Weight change: 7.673 kg (16 lb 14.6 oz) Last BM Date: 05/07/14  Intake/Output from previous day: 01/30 0701 - 01/31 0700 In: 1220 [P.O.:1220] Out: 3700 [Urine:3700]  PHYSICAL EXAM General appearance: alert, cooperative and mild distress Resp: rhonchi bilaterally Cardio: regular rate and rhythm, S1, S2 normal, no murmur, click, rub or gallop GI: soft, non-tender; bowel sounds normal; no masses,  no organomegaly Extremities: extremities normal, atraumatic, no cyanosis or edema  Lab Results:  Results for orders placed or performed during the hospital encounter of 05/06/14 (from the past 48 hour(s))  CBC with Differential/Platelet     Status: Abnormal   Collection Time: 05/06/14 11:55 PM  Result Value Ref Range   WBC 11.6 (H) 4.0 - 10.5 K/uL   RBC 4.29 4.22 - 5.81 MIL/uL   Hemoglobin 12.6 (L) 13.0 - 17.0 g/dL   HCT 38.6 (L) 39.0 - 52.0 %   MCV 90.0 78.0 - 100.0 fL   MCH 29.4 26.0 - 34.0 pg   MCHC 32.6 30.0 - 36.0 g/dL   RDW 13.7 11.5 - 15.5 %   Platelets 330 150 - 400 K/uL   Neutrophils Relative % 89 (H) 43 - 77 %   Neutro Abs 10.3 (H) 1.7 - 7.7 K/uL   Lymphocytes Relative 6 (L) 12 - 46 %   Lymphs Abs 0.7 0.7 - 4.0 K/uL   Monocytes Relative 5 3 - 12 %   Monocytes Absolute 0.6 0.1 - 1.0 K/uL   Eosinophils Relative 0 0 - 5 %   Eosinophils Absolute 0.0 0.0 - 0.7 K/uL   Basophils Relative 0 0 - 1 %   Basophils Absolute  0.0 0.0 - 0.1 K/uL  Comprehensive metabolic panel     Status: Abnormal   Collection Time: 05/06/14 11:55 PM  Result Value Ref Range   Sodium 132 (L) 135 - 145 mmol/L   Potassium 4.2 3.5 - 5.1 mmol/L   Chloride 97 96 - 112 mmol/L   CO2 27 19 - 32 mmol/L   Glucose, Bld 360 (H) 70 - 99 mg/dL   BUN 16 6 - 23 mg/dL   Creatinine, Ser 0.87 0.50 - 1.35 mg/dL   Calcium 8.7 8.4 - 10.5 mg/dL   Total Protein 6.7 6.0 - 8.3 g/dL   Albumin 3.4 (L) 3.5 - 5.2 g/dL   AST 10 0 - 37 U/L   ALT 11 0 - 53 U/L   Alkaline Phosphatase 72 39 - 117 U/L   Total Bilirubin 0.8 0.3 - 1.2 mg/dL   GFR calc non Af Amer 89 (L) >90 mL/min   GFR calc Af Amer >90 >90 mL/min    Comment: (NOTE) The eGFR has been calculated using the CKD EPI equation. This calculation has not been validated in all clinical situations. eGFR's persistently <90 mL/min signify possible Chronic Kidney Disease.    Anion gap 8 5 - 15  Brain natriuretic peptide     Status: Abnormal   Collection Time: 05/06/14 11:55 PM  Result Value Ref Range   B Natriuretic Peptide 646.0 (H) 0.0 - 100.0 pg/mL  Troponin I     Status: Abnormal   Collection Time: 05/06/14 11:55 PM  Result Value Ref Range   Troponin I 0.11 (H) <0.031 ng/mL    Comment:        PERSISTENTLY INCREASED TROPONIN VALUES IN THE RANGE OF 0.04-0.49 ng/mL CAN BE SEEN IN:       -UNSTABLE ANGINA       -CONGESTIVE HEART FAILURE       -MYOCARDITIS       -CHEST TRAUMA       -ARRYHTHMIAS       -LATE PRESENTING MYOCARDIAL INFARCTION       -COPD   CLINICAL FOLLOW-UP RECOMMENDED.   Urinalysis, Routine w reflex microscopic     Status: Abnormal   Collection Time: 05/07/14  1:20 AM  Result Value Ref Range   Color, Urine YELLOW YELLOW   APPearance CLEAR CLEAR   Specific Gravity, Urine 1.015 1.005 - 1.030   pH 6.5 5.0 - 8.0   Glucose, UA >1000 (A) NEGATIVE mg/dL   Hgb urine dipstick TRACE (A) NEGATIVE   Bilirubin Urine NEGATIVE NEGATIVE   Ketones, ur TRACE (A) NEGATIVE mg/dL   Protein,  ur 30 (A) NEGATIVE mg/dL   Urobilinogen, UA 0.2 0.0 - 1.0 mg/dL   Nitrite NEGATIVE NEGATIVE   Leukocytes, UA NEGATIVE NEGATIVE  Urine microscopic-add on     Status: None   Collection Time: 05/07/14  1:20 AM  Result Value Ref Range   Squamous Epithelial / LPF RARE RARE   WBC, UA 0-2 <3 WBC/hpf   RBC / HPF 0-2 <3 RBC/hpf   Bacteria, UA RARE RARE  MRSA PCR Screening     Status: None   Collection Time: 05/07/14  4:25 AM  Result Value Ref Range   MRSA by PCR NEGATIVE NEGATIVE    Comment:        The GeneXpert MRSA Assay (FDA approved for NASAL specimens only), is one component of a comprehensive MRSA colonization surveillance program. It is not intended to diagnose MRSA infection nor to guide or monitor treatment for MRSA infections.   Troponin I (q 6hr x 3)     Status: Abnormal   Collection Time: 05/07/14  4:59 AM  Result Value Ref Range   Troponin I 0.09 (H) <0.031 ng/mL    Comment:        PERSISTENTLY INCREASED TROPONIN VALUES IN THE RANGE OF 0.04-0.49 ng/mL CAN BE SEEN IN:       -UNSTABLE ANGINA       -CONGESTIVE HEART FAILURE       -MYOCARDITIS       -CHEST TRAUMA       -ARRYHTHMIAS       -LATE PRESENTING MYOCARDIAL INFARCTION       -COPD   CLINICAL FOLLOW-UP RECOMMENDED.   D-dimer, quantitative     Status: Abnormal   Collection Time: 05/07/14  4:59 AM  Result Value Ref Range   D-Dimer, Quant 19.97 (H) 0.00 - 0.48 ug/mL-FEU    Comment:        AT THE INHOUSE ESTABLISHED CUTOFF VALUE OF 0.48 ug/mL FEU, THIS ASSAY HAS BEEN DOCUMENTED IN THE LITERATURE TO HAVE A SENSITIVITY AND NEGATIVE PREDICTIVE VALUE OF AT LEAST 98 TO 99%.  THE TEST RESULT SHOULD BE CORRELATED WITH AN ASSESSMENT OF THE CLINICAL PROBABILITY  OF DVT / VTE.   Glucose, capillary     Status: Abnormal   Collection Time: 05/07/14  5:57 AM  Result Value Ref Range   Glucose-Capillary 390 (H) 70 - 99 mg/dL   Comment 1 Notify RN    Comment 2 Repeat Test   Lipid panel     Status: Abnormal    Collection Time: 05/07/14  7:12 AM  Result Value Ref Range   Cholesterol 278 (H) 0 - 200 mg/dL   Triglycerides 99 <150 mg/dL   HDL 43 >39 mg/dL   Total CHOL/HDL Ratio 6.5 RATIO   VLDL 20 0 - 40 mg/dL   LDL Cholesterol 215 (H) 0 - 99 mg/dL    Comment:        Total Cholesterol/HDL:CHD Risk Coronary Heart Disease Risk Table                     Men   Women  1/2 Average Risk   3.4   3.3  Average Risk       5.0   4.4  2 X Average Risk   9.6   7.1  3 X Average Risk  23.4   11.0        Use the calculated Patient Ratio above and the CHD Risk Table to determine the patient's CHD Risk.        ATP III CLASSIFICATION (LDL):  <100     mg/dL   Optimal  100-129  mg/dL   Near or Above                    Optimal  130-159  mg/dL   Borderline  160-189  mg/dL   High  >190     mg/dL   Very High   CBC with Differential/Platelet     Status: Abnormal   Collection Time: 05/07/14  7:12 AM  Result Value Ref Range   WBC 10.2 4.0 - 10.5 K/uL   RBC 4.22 4.22 - 5.81 MIL/uL   Hemoglobin 12.7 (L) 13.0 - 17.0 g/dL   HCT 37.7 (L) 39.0 - 52.0 %   MCV 89.3 78.0 - 100.0 fL   MCH 30.1 26.0 - 34.0 pg   MCHC 33.7 30.0 - 36.0 g/dL   RDW 13.5 11.5 - 15.5 %   Platelets 265 150 - 400 K/uL   Neutrophils Relative % 95 (H) 43 - 77 %   Neutro Abs 9.7 (H) 1.7 - 7.7 K/uL   Lymphocytes Relative 4 (L) 12 - 46 %   Lymphs Abs 0.4 (L) 0.7 - 4.0 K/uL   Monocytes Relative 1 (L) 3 - 12 %   Monocytes Absolute 0.1 0.1 - 1.0 K/uL   Eosinophils Relative 0 0 - 5 %   Eosinophils Absolute 0.0 0.0 - 0.7 K/uL   Basophils Relative 0 0 - 1 %   Basophils Absolute 0.0 0.0 - 0.1 K/uL  Comprehensive metabolic panel     Status: Abnormal   Collection Time: 05/07/14  7:12 AM  Result Value Ref Range   Sodium 131 (L) 135 - 145 mmol/L   Potassium 3.7 3.5 - 5.1 mmol/L   Chloride 93 (L) 96 - 112 mmol/L   CO2 28 19 - 32 mmol/L   Glucose, Bld 358 (H) 70 - 99 mg/dL   BUN 17 6 - 23 mg/dL   Creatinine, Ser 1.02 0.50 - 1.35 mg/dL   Calcium 9.0  8.4 - 10.5 mg/dL   Total Protein 6.6 6.0 - 8.3  g/dL   Albumin 3.4 (L) 3.5 - 5.2 g/dL   AST 12 0 - 37 U/L   ALT 11 0 - 53 U/L   Alkaline Phosphatase 62 39 - 117 U/L   Total Bilirubin 0.8 0.3 - 1.2 mg/dL   GFR calc non Af Amer 75 (L) >90 mL/min   GFR calc Af Amer 87 (L) >90 mL/min    Comment: (NOTE) The eGFR has been calculated using the CKD EPI equation. This calculation has not been validated in all clinical situations. eGFR's persistently <90 mL/min signify possible Chronic Kidney Disease.    Anion gap 10 5 - 15  TSH     Status: None   Collection Time: 05/07/14  7:12 AM  Result Value Ref Range   TSH 0.656 0.350 - 4.500 uIU/mL  Glucose, capillary     Status: Abnormal   Collection Time: 05/07/14  8:16 AM  Result Value Ref Range   Glucose-Capillary 300 (H) 70 - 99 mg/dL   Comment 1 Documented in Chart    Comment 2 Notify RN   Glucose, capillary     Status: Abnormal   Collection Time: 05/07/14 11:40 AM  Result Value Ref Range   Glucose-Capillary 319 (H) 70 - 99 mg/dL  Troponin I (q 6hr x 3)     Status: Abnormal   Collection Time: 05/07/14 12:50 PM  Result Value Ref Range   Troponin I 0.09 (H) <0.031 ng/mL    Comment:        PERSISTENTLY INCREASED TROPONIN VALUES IN THE RANGE OF 0.04-0.49 ng/mL CAN BE SEEN IN:       -UNSTABLE ANGINA       -CONGESTIVE HEART FAILURE       -MYOCARDITIS       -CHEST TRAUMA       -ARRYHTHMIAS       -LATE PRESENTING MYOCARDIAL INFARCTION       -COPD   CLINICAL FOLLOW-UP RECOMMENDED.   Glucose, capillary     Status: Abnormal   Collection Time: 05/07/14  4:58 PM  Result Value Ref Range   Glucose-Capillary 246 (H) 70 - 99 mg/dL   Comment 1 Notify RN    Comment 2 Documented in Chart   Glucose, capillary     Status: Abnormal   Collection Time: 05/07/14  7:29 PM  Result Value Ref Range   Glucose-Capillary 315 (H) 70 - 99 mg/dL  Troponin I (q 6hr x 3)     Status: Abnormal   Collection Time: 05/07/14  8:11 PM  Result Value Ref Range    Troponin I 0.09 (H) <0.031 ng/mL    Comment:        PERSISTENTLY INCREASED TROPONIN VALUES IN THE RANGE OF 0.04-0.49 ng/mL CAN BE SEEN IN:       -UNSTABLE ANGINA       -CONGESTIVE HEART FAILURE       -MYOCARDITIS       -CHEST TRAUMA       -ARRYHTHMIAS       -LATE PRESENTING MYOCARDIAL INFARCTION       -COPD   CLINICAL FOLLOW-UP RECOMMENDED.   Glucose, capillary     Status: Abnormal   Collection Time: 05/07/14 11:59 PM  Result Value Ref Range   Glucose-Capillary 201 (H) 70 - 99 mg/dL  Glucose, capillary     Status: Abnormal   Collection Time: 05/08/14  4:09 AM  Result Value Ref Range   Glucose-Capillary 204 (H) 70 - 99 mg/dL  Glucose, capillary  Status: Abnormal   Collection Time: 05/08/14  7:29 AM  Result Value Ref Range   Glucose-Capillary 217 (H) 70 - 99 mg/dL   Comment 1 Documented in Chart    Comment 2 Notify RN     ABGS No results for input(s): PHART, PO2ART, TCO2, HCO3 in the last 72 hours.  Invalid input(s): PCO2 CULTURES Recent Results (from the past 240 hour(s))  MRSA PCR Screening     Status: None   Collection Time: 05/07/14  4:25 AM  Result Value Ref Range Status   MRSA by PCR NEGATIVE NEGATIVE Final    Comment:        The GeneXpert MRSA Assay (FDA approved for NASAL specimens only), is one component of a comprehensive MRSA colonization surveillance program. It is not intended to diagnose MRSA infection nor to guide or monitor treatment for MRSA infections.    Studies/Results: Dg Chest Port 1 View  05/07/2014   CLINICAL DATA:  Mid sternal chest pain  EXAM: PORTABLE CHEST - 1 VIEW  COMPARISON:  03/05/2014  FINDINGS: There are diffuse interstitial and alveolar airspace opacities. Prominence of the central pulmonary vasculature. Small bilateral pleural effusions. No pneumothorax. Stable cardiomegaly. Prior CABG.  IMPRESSION: Moderate CHF.   Electronically Signed   By: Kathreen Devoid   On: 05/07/2014 00:23    Medications:  Prior to Admission:   Prescriptions prior to admission  Medication Sig Dispense Refill Last Dose  . Aspirin-Salicylamide-Caffeine (BC FAST PAIN RELIEF ARTHRITIS) 161-096-04 MG PACK Take 1 packet by mouth 3 (three) times daily as needed (for pain).   Taking  . atorvastatin (LIPITOR) 80 MG tablet Take 1 tablet (80 mg total) by mouth daily. 90 tablet 3 Taking  . carvedilol (COREG) 6.25 MG tablet Take 1 tablet (6.25 mg total) by mouth 2 (two) times daily. 180 tablet 3   . clopidogrel (PLAVIX) 75 MG tablet Take 1 tablet (75 mg total) by mouth daily. 90 tablet 3   . digoxin (LANOXIN) 0.25 MG tablet Take 1 tablet (0.25 mg total) by mouth daily. 30 tablet 12 Taking  . furosemide (LASIX) 80 MG tablet Take 80 mg by mouth 2 (two) times daily.   Taking  . insulin NPH Human (HUMULIN N,NOVOLIN N) 100 UNIT/ML injection Inject 0.75 mLs (75 Units total) into the skin 2 (two) times daily. 10 mL 11 Taking  . isosorbide mononitrate (IMDUR) 60 MG 24 hr tablet Take 1 tablet (60 mg total) by mouth daily. 90 tablet 3   . KLOR-CON M20 20 MEQ tablet Take 20 mEq by mouth daily.    Taking  . lisinopril (PRINIVIL,ZESTRIL) 2.5 MG tablet TAKE ONE TABLET BY MOUTH ONCE DAILY 30 tablet 6 Taking  . nitroGLYCERIN (NITROSTAT) 0.4 MG SL tablet Place 1 tablet (0.4 mg total) under the tongue every 5 (five) minutes as needed for chest pain. 25 tablet 3   . oxyCODONE-acetaminophen (PERCOCET) 10-325 MG per tablet Take 1 tablet by mouth every 4 (four) hours as needed for pain.   Taking  . ranolazine (RANEXA) 500 MG 12 hr tablet Take 1 tablet (500 mg total) by mouth 2 (two) times daily. 60 tablet 12 Taking  . sulfamethoxazole-trimethoprim (BACTRIM DS,SEPTRA DS) 800-160 MG per tablet Take 1 tablet by mouth 2 (two) times daily.   Taking  . vitamin B-12 (CYANOCOBALAMIN) 1000 MCG tablet Take 1,000 mcg by mouth daily.   Taking  . vitamin C (ASCORBIC ACID) 500 MG tablet Take 1,000 mg by mouth at bedtime.   Taking  . vitamin  E 400 UNIT capsule Take 400 Units by mouth  at bedtime.    Taking   Scheduled: . antiseptic oral rinse  7 mL Mouth Rinse BID  . atorvastatin  80 mg Oral Daily  . carvedilol  6.25 mg Oral BID  . clopidogrel  75 mg Oral Daily  . digoxin  0.25 mg Oral Daily  . docusate sodium  100 mg Oral BID  . feeding supplement (ENSURE COMPLETE)  237 mL Oral BID BM  . furosemide  80 mg Intravenous BID  . insulin aspart  0-20 Units Subcutaneous TID WC  . insulin aspart  0-5 Units Subcutaneous QHS  . isosorbide mononitrate  60 mg Oral Daily  . levofloxacin (LEVAQUIN) IV  500 mg Intravenous Q24H  . lisinopril  2.5 mg Oral Daily  . potassium chloride SA  20 mEq Oral Daily  . predniSONE  60 mg Oral Q breakfast  . ranolazine  500 mg Oral BID  . sertraline  50 mg Oral Daily  . sodium chloride  3 mL Intravenous Q12H  . tiotropium  18 mcg Inhalation Daily  . vitamin B-12  1,000 mcg Oral Daily  . vitamin C  1,000 mg Oral QHS  . vitamin E  400 Units Oral QHS   Continuous:  DUK:GURKYHCWC, ALPRAZolam, diphenhydrAMINE, hydrocortisone cream, nitroGLYCERIN, oxyCODONE-acetaminophen, zolpidem  Assesment: He was admitted with chest pain. He had exacerbation of congestive heart failure. He has a rash and itching. He is depressed. He is not suicidal. Active Problems:   Diabetes mellitus   Chest pain   CHF exacerbation   Tachycardia   Malnutrition of moderate degree    Plan: Continue current treatments. Move him from ICU. Request dermatology consultation tomorrow    LOS: 2 days   Tylan Briguglio L 05/08/2014, 9:53 AM

## 2014-05-09 LAB — GLUCOSE, CAPILLARY
GLUCOSE-CAPILLARY: 201 mg/dL — AB (ref 70–99)
GLUCOSE-CAPILLARY: 222 mg/dL — AB (ref 70–99)
GLUCOSE-CAPILLARY: 269 mg/dL — AB (ref 70–99)
Glucose-Capillary: 234 mg/dL — ABNORMAL HIGH (ref 70–99)

## 2014-05-09 LAB — HEMOGLOBIN A1C
Hgb A1c MFr Bld: 10.6 % — ABNORMAL HIGH (ref 4.8–5.6)
MEAN PLASMA GLUCOSE: 258 mg/dL

## 2014-05-09 MED ORDER — FUROSEMIDE 80 MG PO TABS
80.0000 mg | ORAL_TABLET | Freq: Two times a day (BID) | ORAL | Status: DC
  Administered 2014-05-09 – 2014-05-10 (×3): 80 mg via ORAL
  Filled 2014-05-09 (×3): qty 1

## 2014-05-09 MED ORDER — LEVOFLOXACIN 500 MG PO TABS
500.0000 mg | ORAL_TABLET | Freq: Every day | ORAL | Status: DC
  Administered 2014-05-10: 500 mg via ORAL
  Filled 2014-05-09: qty 1

## 2014-05-09 MED ORDER — INSULIN GLARGINE 100 UNIT/ML ~~LOC~~ SOLN
25.0000 [IU] | Freq: Every day | SUBCUTANEOUS | Status: DC
  Administered 2014-05-09 – 2014-05-10 (×2): 25 [IU] via SUBCUTANEOUS
  Filled 2014-05-09 (×2): qty 0.25

## 2014-05-09 NOTE — Progress Notes (Signed)
4-beat run of V-tach once - Dr Juanetta GoslingHawkins to be notified if repeated.

## 2014-05-09 NOTE — Clinical Social Work Psychosocial (Signed)
Clinical Social Work Department BRIEF PSYCHOSOCIAL ASSESSMENT 05/09/2014  Patient:  Timothy Trujillo, Timothy Trujillo     Account Number:  [id number]     Admit date:  05/06/2014  Clinical Social Worker:  Wyatt Haste  Date/Time:  05/09/2014 03:12 PM  Referred by:  Physician  Date Referred:  05/09/2014 Referred for  Behavioral Health Issues   Other Referral:   Interview type:  Patient Other interview type:    PSYCHOSOCIAL DATA Living Status:  ALONE Admitted from facility:   Level of care:   Primary support name:  [name] Primary support relationship to patient:  FAMILY Degree of support available:   66 years old, but with pt the most    CURRENT CONCERNS Current Concerns  Behavioral Health Issues   Other Concerns:    SOCIAL WORK ASSESSMENT / PLAN CSW met with pt at bedside. Pt alert and oriented and reports he lives alone. He said he came to ED due to SOB and chest pain. Pt feels his best support is his 34 year old grandson, [name]. Pt has two sons, but they work long hours. [name] stays with pt on the weekend and provides transportation. He also has a brother-in-law who has been visiting him more. Pt reported feelings of depression and TTS consult was requested. Recommendation is for outpatient follow up. Pt said his wife died about 8 years ago and he has been lonely since then. He feels at times that it is just as painful now as then. He describes a very caring relationship with his wife. Support provided. Pt is a retired Administrator. He still drives, but doesn't like to due to sometimes feeling lightheaded. Pt relies on his strong faith during difficult times. He reports he has a good relationship with his pastor and talks to him often for support. CSW discussed outpatient follow up.  He at first said that transportation would be an issue. CSW discussed possibly asking his brother-in-law to take him since he would have plenty of notice and his grandson is in school. Pt then stated that he was able to  open up to his pastor, but he can not do that with a stranger. He was open to contact information for [facility] if he felt it was needed, but right now refuses for CSW to set up appointment. He states that "everyone gets depressed once in awhile." CSW discussed with MD who is aware pt does not want appointment made.   Assessment/plan status:  Referral to [employer] Other assessment/ plan:   Information/referral to community resources:   [employer]    PATIENT'S/FAMILY'S RESPONSE TO PLAN OF CARE: Pt plans to d/c home tomorrow. He will consider outpatient follow up if he feels it is needed in the future. CSW will sign off, but can be reconsulted if needed.       [name], [place]

## 2014-05-09 NOTE — Progress Notes (Signed)
PHARMACIST - PHYSICIAN COMMUNICATION DR:   Juanetta GoslingHawkins CONCERNING: Antibiotic IV to Oral Route Change Policy  RECOMMENDATION: This patient is receiving Levaquin by the intravenous route.  Based on criteria approved by the Pharmacy and Therapeutics Committee, the antibiotic(s) is/are being converted to the equivalent oral dose form(s).  DESCRIPTION: These criteria include:  Patient being treated for a respiratory tract infection, urinary tract infection, cellulitis or clostridium difficile associated diarrhea if on metronidazole  The patient is not neutropenic and does not exhibit a GI malabsorption state  The patient is eating (either orally or via tube) and/or has been taking other orally administered medications for a least 24 hours  The patient is improving clinically and has a Tmax < 100.5  If you have questions about this conversion, please contact the Pharmacy Department  [x]   [phone]( [phone] )  [facility] []   [phone]( [phone] )  [facility]  []   [phone]( [phone] )  [facility] []   [phone]( [phone] )  [facility]   Valrie HartScott Kale Rondeau, PharmD

## 2014-05-09 NOTE — Care Management Utilization Note (Signed)
UR completed 

## 2014-05-09 NOTE — Care Management Note (Addendum)
    Page 1 of 1   05/10/2014     12:45:00 PM CARE MANAGEMENT NOTE 05/10/2014  Patient:  Timothy Trujillo,Timothy Trujillo   Account Number:  [id number]  Date Initiated:  05/09/2014  Documentation initiated by:  Trujillo,Timothy  Subjective/Objective Assessment:   Pt is from home, lives alone. Pt has cane, walker and home O2 through [employer]. Pt's grandson helps cooks, do laundry and takes pt to appointments. Pt no longer drives. Pt has no HH services or med needs prior to admission.     Action/Plan:   Pt plans to discharge home with self care. Pt has port O2 for transport home. P has no CM needs at this time.   Anticipated DC Date:  05/10/2014   Anticipated DC Plan:  HOME/SELF CARE      DC Planning Services  CM consult      Choice offered to / List presented to:             Status of service:  Completed, signed off Medicare Important Message given?  YES (If response is "NO", the following Medicare IM given date fields will be blank) Date Medicare IM given:  05/10/2014 Medicare IM given by:  Timothy Trujillo Date Additional Medicare IM given:   Additional Medicare IM given by:    Discharge Disposition:  HOME/SELF CARE  Per UR Regulation:    If discussed at Long Length of Stay Meetings, dates discussed:    Comments:  05/10/2014 1200 Timothy SheriffJessica Childress, RN, MSN, CM Pt dishcarging home today. No CM needs at the time of discharge. 05/09/2014 1030 Timothy SheriffJessica Childress, RN, MSN, CM

## 2014-05-09 NOTE — Progress Notes (Signed)
Subjective: He says he feels better but is still having trouble with itching. He says his breathing is okay. He is not having any chest pain. 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 Cardio: regular rate and rhythm, S1, S2 normal, no murmur, click, rub or gallop GI: soft, non-tender; bowel sounds normal; no masses,  no organomegaly Extremities: extremities normal, atraumatic, no cyanosis or edema  Lab Results:  Results for orders placed or performed during the hospital encounter of 05/06/14 (from the past 48 hour(s))  Glucose, capillary     Status: Abnormal   Collection Time: 05/07/14 11:40 AM  Result Value Ref Range   Glucose-Capillary 319 (H) 70 - 99 mg/dL  Troponin I (q 6hr x 3)     Status: Abnormal   Collection Time: 05/07/14 12:50 PM  Result Value Ref Range   Troponin I 0.09 (H) <0.031 ng/mL    Comment:        PERSISTENTLY INCREASED TROPONIN VALUES IN THE RANGE OF 0.04-0.49 ng/mL CAN BE SEEN IN:       -UNSTABLE ANGINA       -CONGESTIVE HEART FAILURE       -MYOCARDITIS       -CHEST TRAUMA       -ARRYHTHMIAS       -LATE PRESENTING MYOCARDIAL INFARCTION       -COPD   CLINICAL FOLLOW-UP RECOMMENDED.   Glucose, capillary     Status: Abnormal   Collection Time: 05/07/14  4:58 PM  Result Value Ref Range   Glucose-Capillary 246 (H) 70 - 99 mg/dL   Comment 1 Notify RN    Comment 2  Documented in Chart   Glucose, capillary     Status: Abnormal   Collection Time: 05/07/14  7:29 PM  Result Value Ref Range   Glucose-Capillary 315 (H) 70 - 99 mg/dL  Troponin I (q 6hr x 3)     Status: Abnormal   Collection Time: 05/07/14  8:11 PM  Result Value Ref Range   Troponin I 0.09 (H) <0.031 ng/mL    Comment:        PERSISTENTLY INCREASED TROPONIN VALUES IN THE RANGE OF 0.04-0.49 ng/mL CAN BE SEEN IN:       -UNSTABLE ANGINA       -CONGESTIVE HEART FAILURE       -MYOCARDITIS       -CHEST TRAUMA       -ARRYHTHMIAS       -LATE PRESENTING MYOCARDIAL INFARCTION       -COPD   CLINICAL FOLLOW-UP RECOMMENDED.   Glucose, capillary     Status: Abnormal   Collection Time: 05/07/14 11:59 PM  Result Value Ref Range   Glucose-Capillary 201 (H) 70 - 99 mg/dL  Glucose, capillary     Status: Abnormal   Collection Time: 05/08/14  4:09 AM  Result Value Ref Range   Glucose-Capillary 204 (H)  70 - 99 mg/dL  Glucose, capillary     Status: Abnormal   Collection Time: 05/08/14  7:29 AM  Result Value Ref Range   Glucose-Capillary 217 (H) 70 - 99 mg/dL   Comment 1 Documented in Chart    Comment 2 Notify RN   Glucose, capillary     Status: Abnormal   Collection Time: 05/08/14 11:13 AM  Result Value Ref Range   Glucose-Capillary 283 (H) 70 - 99 mg/dL  Glucose, capillary     Status: Abnormal   Collection Time: 05/08/14  4:27 PM  Result Value Ref Range   Glucose-Capillary 363 (H) 70 - 99 mg/dL   Comment 1 Documented in Chart    Comment 2 Notify RN   Glucose, capillary     Status: Abnormal   Collection Time: 05/08/14  9:56 PM  Result Value Ref Range   Glucose-Capillary 321 (H) 70 - 99 mg/dL   Comment 1 Notify RN   Glucose, capillary     Status: Abnormal   Collection Time: 05/09/14  7:29 AM  Result Value Ref Range   Glucose-Capillary 234 (H) 70 - 99 mg/dL   Comment 1 Notify RN     ABGS No results for input(s): PHART, PO2ART, TCO2, HCO3 in the last 72 hours.  Invalid input(s):  PCO2 CULTURES Recent Results (from the past 240 hour(s))  Urine culture     Status: None   Collection Time: 05/07/14  1:20 AM  Result Value Ref Range Status   Specimen Description URINE, CATHETERIZED  Final   Special Requests NONE  Final   Colony Count NO GROWTH Performed at [facility]   Final   Culture NO GROWTH Performed at [facility]   Final   Report Status 05/08/2014 FINAL  Final  MRSA PCR Screening     Status: None   Collection Time: 05/07/14  4:25 AM  Result Value Ref Range Status   MRSA by PCR NEGATIVE NEGATIVE Final    Comment:        The GeneXpert MRSA Assay (FDA approved for NASAL specimens only), is one component of a comprehensive MRSA colonization surveillance program. It is not intended to diagnose MRSA infection nor to guide or monitor treatment for MRSA infections.    Studies/Results: Ct Angio Chest Pe W/cm &/or Wo Cm  05/08/2014   CLINICAL DATA:  Acute onset of shortness of breath and generalized chest pain. Elevated D-dimer. Initial encounter.  EXAM: CT ANGIOGRAPHY CHEST WITH CONTRAST  TECHNIQUE: Multidetector CT imaging of the chest was performed using the standard protocol during bolus administration of intravenous contrast. Multiplanar CT image reconstructions and MIPs were obtained to evaluate the vascular anatomy.  CONTRAST:  OMNIPAQUE IOHEXOL 350 MG/ML SOLN  COMPARISON:  CTA of the chest performed 06/13/2012, and chest radiograph performed 05/07/2014  FINDINGS: There is no evidence of pulmonary embolus.  Small bilateral pleural effusions are again noted, with diffuse interstitial prominence and hazy bilateral airspace opacification, compatible with pulmonary edema. There appears to be mild underlying chronic interstitial change, of uncertain significance, and pleural calcification, which may reflect prior asbestos exposure. Would correlate clinically. There is no evidence of pneumothorax. No masses are identified; no abnormal focal  contrast enhancement is seen.  Diffuse coronary artery calcifications are seen. The mediastinum is otherwise unremarkable in appearance. No mediastinal lymphadenopathy is seen. No pericardial effusion is identified. Scattered calcification is noted along the aortic arch and proximal great vessels. The patient is status post median sternotomy. No axillary lymphadenopathy is  seen. The visualized portions of the thyroid gland are unremarkable in appearance.  A calcified granuloma is noted within the medial right hepatic lobe. The visualized portions of the liver and spleen are otherwise unremarkable. The patient is status post cholecystectomy, with clips noted along the gallbladder fossa. The visualized portions of the pancreas, adrenal glands and both kidneys are grossly unremarkable. Nonspecific perinephric stranding is noted bilaterally.  No acute osseous abnormalities are seen.  Review of the MIP images confirms the above findings.  IMPRESSION: 1. No evidence of pulmonary embolus. 2. Small bilateral pleural effusions, with diffuse interstitial prominence and hazy bilateral airspace opacification, compatible with pulmonary edema. 3. Mild underlying chronic interstitial change noted, of uncertain significance. If the patient's symptoms persist after completion of treatment for pulmonary edema, would assess further to exclude underlying chronic interstitial lung disease. 4. Pleural calcification noted; this may reflect prior asbestos exposure. Would correlate clinically. 5. Diffuse coronary artery calcifications seen.   Electronically Signed   By: Roanna Raider M.D.   On: 05/08/2014 19:00    Medications:  Prior to Admission:  Prescriptions prior to admission  Medication Sig Dispense Refill Last Dose  . Aspirin-Salicylamide-Caffeine (BC FAST PAIN RELIEF ARTHRITIS) 161-096-04 MG PACK Take 1 packet by mouth 3 (three) times daily as needed (for pain).   Taking  . atorvastatin (LIPITOR) 80 MG tablet Take 1 tablet  (80 mg total) by mouth daily. 90 tablet 3 Taking  . carvedilol (COREG) 6.25 MG tablet Take 1 tablet (6.25 mg total) by mouth 2 (two) times daily. 180 tablet 3   . clopidogrel (PLAVIX) 75 MG tablet Take 1 tablet (75 mg total) by mouth daily. 90 tablet 3   . digoxin (LANOXIN) 0.25 MG tablet Take 1 tablet (0.25 mg total) by mouth daily. 30 tablet 12 Taking  . furosemide (LASIX) 80 MG tablet Take 80 mg by mouth 2 (two) times daily.   Taking  . insulin NPH Human (HUMULIN N,NOVOLIN N) 100 UNIT/ML injection Inject 0.75 mLs (75 Units total) into the skin 2 (two) times daily. 10 mL 11 Taking  . isosorbide mononitrate (IMDUR) 60 MG 24 hr tablet Take 1 tablet (60 mg total) by mouth daily. 90 tablet 3   . KLOR-CON M20 20 MEQ tablet Take 20 mEq by mouth daily.    Taking  . lisinopril (PRINIVIL,ZESTRIL) 2.5 MG tablet TAKE ONE TABLET BY MOUTH ONCE DAILY 30 tablet 6 Taking  . nitroGLYCERIN (NITROSTAT) 0.4 MG SL tablet Place 1 tablet (0.4 mg total) under the tongue every 5 (five) minutes as needed for chest pain. 25 tablet 3   . oxyCODONE-acetaminophen (PERCOCET) 10-325 MG per tablet Take 1 tablet by mouth every 4 (four) hours as needed for pain.   Taking  . ranolazine (RANEXA) 500 MG 12 hr tablet Take 1 tablet (500 mg total) by mouth 2 (two) times daily. 60 tablet 12 Taking  . sulfamethoxazole-trimethoprim (BACTRIM DS,SEPTRA DS) 800-160 MG per tablet Take 1 tablet by mouth 2 (two) times daily.   Taking  . vitamin B-12 (CYANOCOBALAMIN) 1000 MCG tablet Take 1,000 mcg by mouth daily.   Taking  . vitamin C (ASCORBIC ACID) 500 MG tablet Take 1,000 mg by mouth at bedtime.   Taking  . vitamin E 400 UNIT capsule Take 400 Units by mouth at bedtime.    Taking   Scheduled: . antiseptic oral rinse  7 mL Mouth Rinse BID  . atorvastatin  80 mg Oral Daily  . carvedilol  6.25 mg Oral BID  .  clopidogrel  75 mg Oral Daily  . digoxin  0.25 mg Oral Daily  . docusate sodium  100 mg Oral BID  . feeding supplement (ENSURE  COMPLETE)  237 mL Oral BID BM  . furosemide  80 mg Oral BID  . insulin aspart  0-20 Units Subcutaneous TID WC  . insulin aspart  0-5 Units Subcutaneous QHS  . isosorbide mononitrate  60 mg Oral Daily  . levofloxacin (LEVAQUIN) IV  500 mg Intravenous Q24H  . lisinopril  2.5 mg Oral Daily  . potassium chloride SA  20 mEq Oral Daily  . predniSONE  60 mg Oral Q breakfast  . ranolazine  500 mg Oral BID  . sertraline  50 mg Oral Daily  . sodium chloride  3 mL Intravenous Q12H  . tiotropium  18 mcg Inhalation Daily  . vitamin B-12  1,000 mcg Oral Daily  . vitamin C  1,000 mg Oral QHS  . vitamin E  400 Units Oral QHS   Continuous:  ZOX:[id number], ALPRAZolam, diphenhydrAMINE, hydrocortisone cream, nitroGLYCERIN, oxyCODONE-acetaminophen, zolpidem  Assesment: He was admitted with exacerbation of congestive heart failure. He has coronary artery occlusive disease and is not really a candidate for any other treatment. He's better from his CHF exacerbation. He had CT of the chest done yesterday that did not show pulmonary emboli. He has diabetes and his blood sugar is still high. He has chronic itching on his back which has been present now for about 3 weeks. He is on prednisone which may account for some of his problems with his blood sugar. 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 Change him to oral Lasix. Discontinue Foley catheter. I will see if we can get the dermatologist to take a look at his itching area although there is no rash alternatively I will discuss with him by phone he is approaching discharge    LOS: 3 days   Evella Kasal L 05/09/2014, 8:16 AM

## 2014-05-10 DIAGNOSIS — F411 Generalized anxiety disorder: Secondary | ICD-10-CM | POA: Diagnosis present

## 2014-05-10 DIAGNOSIS — F32A Depression, unspecified: Secondary | ICD-10-CM | POA: Diagnosis present

## 2014-05-10 DIAGNOSIS — F329 Major depressive disorder, single episode, unspecified: Secondary | ICD-10-CM | POA: Diagnosis present

## 2014-05-10 LAB — GLUCOSE, CAPILLARY: Glucose-Capillary: 243 mg/dL — ABNORMAL HIGH (ref 70–99)

## 2014-05-10 MED ORDER — DIPHENHYDRAMINE HCL 50 MG PO TABS
50.0000 mg | ORAL_TABLET | Freq: Four times a day (QID) | ORAL | Status: AC | PRN
Start: 2014-05-10 — End: ?

## 2014-05-10 MED ORDER — CEPHALEXIN 500 MG PO CAPS: 500.0000 mg | ORAL_CAPSULE | Freq: Three times a day (TID) | ORAL | Status: DC

## 2014-05-10 MED ORDER — ALPRAZOLAM 0.25 MG PO TABS: 0.2500 mg | ORAL_TABLET | Freq: Two times a day (BID) | ORAL | Status: AC | PRN

## 2014-05-10 MED ORDER — PREDNISONE 10 MG PO TABS: 10.0000 mg | ORAL_TABLET | Freq: Every day | ORAL | Status: DC

## 2014-05-10 MED ORDER — SERTRALINE HCL 50 MG PO TABS: 50.0000 mg | ORAL_TABLET | Freq: Every day | ORAL | Status: AC

## 2014-05-10 NOTE — Progress Notes (Signed)
Patient being d/c home with instructions and prescriptions. IV cath removed and intact. No pain/swelling or swelling at site. C/O pain in back meds given prior to d/c. Patient waiting for daughter to arrive for discharge.

## 2014-05-10 NOTE — Discharge Summary (Signed)
Physician Discharge Summary  Patient ID: Timothy Trujillo MRN: [id number] DOB/AGE: 66-13-1952 66 y.o. Primary Care Physician:Yamilett Anastos L, MD Admit date: 05/06/2014 Discharge date: 05/10/2014    Discharge Diagnoses:   Active Problems:   Diabetes mellitus   Hypertension   Silicosis   Ischemic cardiomyopathy   COPD, severe  O2 dependent   Chest pain   CHF exacerbation   Tachycardia   Malnutrition of moderate degree   Generalized anxiety disorder   Depression     Medication List    TAKE these medications        ALPRAZolam 0.25 MG tablet  Commonly known as:  XANAX  Take 1 tablet (0.25 mg total) by mouth 2 (two) times daily as needed for anxiety.     aspirin 325 MG tablet  Take 325 mg by mouth daily.     aspirin-sod bicarb-citric acid 325 MG Tbef tablet  Commonly known as:  ALKA-SELTZER  Take 650 mg by mouth every 6 (six) hours as needed (pain).     atorvastatin 80 MG tablet  Commonly known as:  LIPITOR  Take 1 tablet (80 mg total) by mouth daily.     BC FAST PAIN RELIEF ARTHRITIS 742-222-38 MG Pack  Generic drug:  Aspirin-Salicylamide-Caffeine  Take 1 packet by mouth 3 (three) times daily as needed (for pain).     carvedilol 6.25 MG tablet  Commonly known as:  COREG  Take 1 tablet (6.25 mg total) by mouth 2 (two) times daily.     cephALEXin 500 MG capsule  Commonly known as:  KEFLEX  Take 1 capsule by mouth 3 (three) times daily.     cephALEXin 500 MG capsule  Commonly known as:  KEFLEX  Take 1 capsule (500 mg total) by mouth 3 (three) times daily.     clopidogrel 75 MG tablet  Commonly known as:  PLAVIX  Take 1 tablet (75 mg total) by mouth daily.     digoxin 0.25 MG tablet  Commonly known as:  LANOXIN  Take 1 tablet (0.25 mg total) by mouth daily.     diphenhydrAMINE 50 MG tablet  Commonly known as:  BENADRYL  Take 1 tablet (50 mg total) by mouth every 6 (six) hours as needed for itching.     furosemide 80 MG tablet  Commonly known as:  LASIX   Take 80 mg by mouth 2 (two) times daily as needed for fluid.     hydrocortisone 2.5 % cream  Apply 1 application topically daily as needed (itching).     insulin NPH Human 100 UNIT/ML injection  Commonly known as:  HUMULIN N,NOVOLIN N  Inject 0.75 mLs (75 Units total) into the skin 2 (two) times daily.     isosorbide mononitrate 60 MG 24 hr tablet  Commonly known as:  IMDUR  Take 1 tablet (60 mg total) by mouth daily.     nitroGLYCERIN 0.4 MG SL tablet  Commonly known as:  NITROSTAT  Place 1 tablet (0.4 mg total) under the tongue every 5 (five) minutes as needed for chest pain.     oxyCODONE-acetaminophen 10-325 MG per tablet  Commonly known as:  PERCOCET  Take 1 tablet by mouth every 4 (four) hours as needed for pain.     predniSONE 10 MG tablet  Commonly known as:  DELTASONE  Take 10-40 mg by mouth daily with breakfast. Take 4 tablets daily for 3 days, 3 tablets daily for 3 days, 2 tablets daily for 3 days, then 1 tablet daily for 3  days.     predniSONE 10 MG tablet  Commonly known as:  DELTASONE  Take 1 tablet (10 mg total) by mouth daily with breakfast.     sertraline 50 MG tablet  Commonly known as:  ZOLOFT  Take 1 tablet (50 mg total) by mouth daily.     vitamin B-12 1000 MCG tablet  Commonly known as:  CYANOCOBALAMIN  Take 1,000 mcg by mouth daily.     vitamin C 500 MG tablet  Commonly known as:  ASCORBIC ACID  Take 1,000 mg by mouth at bedtime.     vitamin E 400 UNIT capsule  Take 400 Units by mouth at bedtime.        Discharged Condition: Improved    Consults: Psychiatry  Significant Diagnostic Studies: Ct Angio Chest Pe W/cm &/or Wo Cm  05/08/2014   CLINICAL DATA:  Acute onset of shortness of breath and generalized chest pain. Elevated D-dimer. Initial encounter.  EXAM: CT ANGIOGRAPHY CHEST WITH CONTRAST  TECHNIQUE: Multidetector CT imaging of the chest was performed using the standard protocol during bolus administration of intravenous contrast.  Multiplanar CT image reconstructions and MIPs were obtained to evaluate the vascular anatomy.  CONTRAST:  OMNIPAQUE IOHEXOL 350 MG/ML SOLN  COMPARISON:  CTA of the chest performed 06/13/2012, and chest radiograph performed 05/07/2014  FINDINGS: There is no evidence of pulmonary embolus.  Small bilateral pleural effusions are again noted, with diffuse interstitial prominence and hazy bilateral airspace opacification, compatible with pulmonary edema. There appears to be mild underlying chronic interstitial change, of uncertain significance, and pleural calcification, which may reflect prior asbestos exposure. Would correlate clinically. There is no evidence of pneumothorax. No masses are identified; no abnormal focal contrast enhancement is seen.  Diffuse coronary artery calcifications are seen. The mediastinum is otherwise unremarkable in appearance. No mediastinal lymphadenopathy is seen. No pericardial effusion is identified. Scattered calcification is noted along the aortic arch and proximal great vessels. The patient is status post median sternotomy. No axillary lymphadenopathy is seen. The visualized portions of the thyroid gland are unremarkable in appearance.  A calcified granuloma is noted within the medial right hepatic lobe. The visualized portions of the liver and spleen are otherwise unremarkable. The patient is status post cholecystectomy, with clips noted along the gallbladder fossa. The visualized portions of the pancreas, adrenal glands and both kidneys are grossly unremarkable. Nonspecific perinephric stranding is noted bilaterally.  No acute osseous abnormalities are seen.  Review of the MIP images confirms the above findings.  IMPRESSION: 1. No evidence of pulmonary embolus. 2. Small bilateral pleural effusions, with diffuse interstitial prominence and hazy bilateral airspace opacification, compatible with pulmonary edema. 3. Mild underlying chronic interstitial change noted, of uncertain  significance. If the patient's symptoms persist after completion of treatment for pulmonary edema, would assess further to exclude underlying chronic interstitial lung disease. 4. Pleural calcification noted; this may reflect prior asbestos exposure. Would correlate clinically. 5. Diffuse coronary artery calcifications seen.   Electronically Signed   By: Roanna Raider M.D.   On: 05/08/2014 19:00   Dg Chest Port 1 View  05/07/2014   CLINICAL DATA:  Mid sternal chest pain  EXAM: PORTABLE CHEST - 1 VIEW  COMPARISON:  03/05/2014  FINDINGS: There are diffuse interstitial and alveolar airspace opacities. Prominence of the central pulmonary vasculature. Small bilateral pleural effusions. No pneumothorax. Stable cardiomegaly. Prior CABG.  IMPRESSION: Moderate CHF.   Electronically Signed   By: Elige Ko   On: 05/07/2014 00:23  Lab Results: Basic Metabolic Panel: No results for input(s): NA, K, CL, CO2, GLUCOSE, BUN, CREATININE, CALCIUM, MG, PHOS in the last 72 hours. Liver Function Tests: No results for input(s): AST, ALT, ALKPHOS, BILITOT, PROT, ALBUMIN in the last 72 hours.   CBC: No results for input(s): WBC, NEUTROABS, HGB, HCT, MCV, PLT in the last 72 hours.  Recent Results (from the past 240 hour(s))  Urine culture     Status: None   Collection Time: 05/07/14  1:20 AM  Result Value Ref Range Status   Specimen Description URINE, CATHETERIZED  Final   Special Requests NONE  Final   Colony Count NO GROWTH [facility]formed at [facility]   Final   Culture NO GROWTH [facility]formed at [facility]   Final   Report Status 05/08/2014 FINAL  Final  MRSA PCR Screening     Status: None   Collection Time: 05/07/14  4:25 AM  Result Value Ref Range Status   MRSA by PCR NEGATIVE NEGATIVE Final    Comment:        The GeneXpert MRSA Assay (FDA approved for NASAL specimens only), is one component of a comprehensive MRSA colonization surveillance program. It is not intended to diagnose  MRSA infection nor to guide or monitor treatment for MRSA infections.      Hospital Course: This is a 64 year old who did his usual state of poor health at home when he developed increasing shortness of breath. He came to the emergency department and was found to have acute on chronic heart failure. He was also depressed and was concerned that he might be suicidal. He was treated with intravenous diuretics and improved. He had psychiatry consultation and it was felt that he was not a danger to himself. He says he would like to have counseling done with his minister. He did have elevated troponin which is frequently an accompaniment of his acute on chronic heart failure. By the time of discharge he was back at baseline. His blood sugar was somewhat elevated during this hospitalization because he is on steroids due to significant itching.  Discharge Exam: Blood pressure 135/88, pulse 86, temperature 97.7 F (36.5 C), temperature source Oral, resp. rate 20, height 6\' 3"  (1.905 m), weight 103.193 kg (227 lb 8 oz), SpO2 98 %. He is awake and alert. His chest is clear. His heart is regular.  Disposition: Home he does not want home health services      Signed: Elisheba Mcdonnell L   05/10/2014, 9:00 AM

## 2014-05-10 NOTE — Progress Notes (Signed)
Subjective: He says he feels well and wants to go home.  Objective: Vital signs in last 24 hours: Temp:  [97.4 F (36.3 C)-97.7 F (36.5 C)] 97.7 F (36.5 C) (02/02 0629) Pulse Rate:  [86-88] 86 (02/02 0629) Resp:  [20] 20 (02/02 0629) BP: (111-135)/(52-88) 135/88 mmHg (02/02 0629) SpO2:  [98 %-100 %] 98 % (02/02 0649) Weight:  [103.193 kg (227 lb 8 oz)] 103.193 kg (227 lb 8 oz) (02/02 0629) Weight change: -9.607 kg (-21 lb 2.9 oz) Last BM Date: 05/07/14  Intake/Output from previous day: 02/01 0701 - 02/02 0700 In: 723 [P.O.:720; I.V.:3] Out: 4325 [Urine:4325]  PHYSICAL EXAM General appearance: alert, cooperative and no distress Resp: clear to auscultation bilaterally Cardio: regular rate and rhythm, S1, S2 normal, no murmur, click, rub or gallop GI: soft, non-tender; bowel sounds normal; no masses,  no organomegaly Extremities: extremities normal, atraumatic, no cyanosis or edema  Lab Results:  Results for orders placed or performed during the hospital encounter of 05/06/14 (from the past 48 hour(s))  Glucose, capillary     Status: Abnormal   Collection Time: 05/08/14 11:13 AM  Result Value Ref Range   Glucose-Capillary 283 (H) 70 - 99 mg/dL  Glucose, capillary     Status: Abnormal   Collection Time: 05/08/14  4:27 PM  Result Value Ref Range   Glucose-Capillary 363 (H) 70 - 99 mg/dL   Comment 1 Documented in Chart    Comment 2 Notify RN   Glucose, capillary     Status: Abnormal   Collection Time: 05/08/14  9:56 PM  Result Value Ref Range   Glucose-Capillary 321 (H) 70 - 99 mg/dL   Comment 1 Notify RN   Glucose, capillary     Status: Abnormal   Collection Time: 05/09/14  7:29 AM  Result Value Ref Range   Glucose-Capillary 234 (H) 70 - 99 mg/dL   Comment 1 Notify RN   Glucose, capillary     Status: Abnormal   Collection Time: 05/09/14 11:46 AM  Result Value Ref Range   Glucose-Capillary 201 (H) 70 - 99 mg/dL   Comment 1 Notify RN   Glucose, capillary      Status: Abnormal   Collection Time: 05/09/14  4:31 PM  Result Value Ref Range   Glucose-Capillary 222 (H) 70 - 99 mg/dL   Comment 1 Notify RN   Glucose, capillary     Status: Abnormal   Collection Time: 05/09/14  9:09 PM  Result Value Ref Range   Glucose-Capillary 269 (H) 70 - 99 mg/dL   Comment 1 Notify RN   Glucose, capillary     Status: Abnormal   Collection Time: 05/10/14  8:19 AM  Result Value Ref Range   Glucose-Capillary 243 (H) 70 - 99 mg/dL   Comment 1 Documented in Chart    Comment 2 Notify RN     ABGS No results for input(s): PHART, PO2ART, TCO2, HCO3 in the last 72 hours.  Invalid input(s): PCO2 CULTURES Recent Results (from the past 240 hour(s))  Urine culture     Status: None   Collection Time: 05/07/14  1:20 AM  Result Value Ref Range Status   Specimen Description URINE, CATHETERIZED  Final   Special Requests NONE  Final   Colony Count NO GROWTH Performed at [facility]   Final   Culture NO GROWTH Performed at [facility]   Final   Report Status 05/08/2014 FINAL  Final  MRSA PCR Screening     Status: None  Collection Time: 05/07/14  4:25 AM  Result Value Ref Range Status   MRSA by PCR NEGATIVE NEGATIVE Final    Comment:        The GeneXpert MRSA Assay (FDA approved for NASAL specimens only), is one component of a comprehensive MRSA colonization surveillance program. It is not intended to diagnose MRSA infection nor to guide or monitor treatment for MRSA infections.    Studies/Results: Ct Angio Chest Pe W/cm &/or Wo Cm  05/08/2014   CLINICAL DATA:  Acute onset of shortness of breath and generalized chest pain. Elevated D-dimer. Initial encounter.  EXAM: CT ANGIOGRAPHY CHEST WITH CONTRAST  TECHNIQUE: Multidetector CT imaging of the chest was performed using the standard protocol during bolus administration of intravenous contrast. Multiplanar CT image reconstructions and MIPs were obtained to evaluate the vascular anatomy.   CONTRAST:  OMNIPAQUE IOHEXOL 350 MG/ML SOLN  COMPARISON:  CTA of the chest performed 06/13/2012, and chest radiograph performed 05/07/2014  FINDINGS: There is no evidence of pulmonary embolus.  Small bilateral pleural effusions are again noted, with diffuse interstitial prominence and hazy bilateral airspace opacification, compatible with pulmonary edema. There appears to be mild underlying chronic interstitial change, of uncertain significance, and pleural calcification, which may reflect prior asbestos exposure. Would correlate clinically. There is no evidence of pneumothorax. No masses are identified; no abnormal focal contrast enhancement is seen.  Diffuse coronary artery calcifications are seen. The mediastinum is otherwise unremarkable in appearance. No mediastinal lymphadenopathy is seen. No pericardial effusion is identified. Scattered calcification is noted along the aortic arch and proximal great vessels. The patient is status post median sternotomy. No axillary lymphadenopathy is seen. The visualized portions of the thyroid gland are unremarkable in appearance.  A calcified granuloma is noted within the medial right hepatic lobe. The visualized portions of the liver and spleen are otherwise unremarkable. The patient is status post cholecystectomy, with clips noted along the gallbladder fossa. The visualized portions of the pancreas, adrenal glands and both kidneys are grossly unremarkable. Nonspecific perinephric stranding is noted bilaterally.  No acute osseous abnormalities are seen.  Review of the MIP images confirms the above findings.  IMPRESSION: 1. No evidence of pulmonary embolus. 2. Small bilateral pleural effusions, with diffuse interstitial prominence and hazy bilateral airspace opacification, compatible with pulmonary edema. 3. Mild underlying chronic interstitial change noted, of uncertain significance. If the patient's symptoms persist after completion of treatment for pulmonary edema,  would assess further to exclude underlying chronic interstitial lung disease. 4. Pleural calcification noted; this may reflect prior asbestos exposure. Would correlate clinically. 5. Diffuse coronary artery calcifications seen.   Electronically Signed   By: Roanna Raider M.D.   On: 05/08/2014 19:00    Medications:  Prior to Admission:  Prescriptions prior to admission  Medication Sig Dispense Refill Last Dose  . aspirin 325 MG tablet Take 325 mg by mouth daily.   Past Week at Unknown time  . Aspirin-Salicylamide-Caffeine (BC FAST PAIN RELIEF ARTHRITIS) 161-096-04 MG PACK Take 1 packet by mouth 3 (three) times daily as needed (for pain).   Past Week at Unknown time  . aspirin-sod bicarb-citric acid (ALKA-SELTZER) 325 MG TBEF tablet Take 650 mg by mouth every 6 (six) hours as needed (pain).   Past Week at Unknown time  . atorvastatin (LIPITOR) 80 MG tablet Take 1 tablet (80 mg total) by mouth daily. 90 tablet 3 Past Week at Unknown time  . carvedilol (COREG) 6.25 MG tablet Take 1 tablet (6.25 mg total) by mouth  2 (two) times daily. 180 tablet 3 Past Week at Unknown time  . cephALEXin (KEFLEX) 500 MG capsule Take 1 capsule by mouth 3 (three) times daily.   Past Week at Unknown time  . clopidogrel (PLAVIX) 75 MG tablet Take 1 tablet (75 mg total) by mouth daily. 90 tablet 3 Past Week at Unknown time  . digoxin (LANOXIN) 0.25 MG tablet Take 1 tablet (0.25 mg total) by mouth daily. 30 tablet 12 Past Week at Unknown time  . furosemide (LASIX) 80 MG tablet Take 80 mg by mouth 2 (two) times daily as needed for fluid.    Past Week at Unknown time  . hydrocortisone 2.5 % cream Apply 1 application topically daily as needed (itching).    Past Week at Unknown time  . insulin NPH Human (HUMULIN N,NOVOLIN N) 100 UNIT/ML injection Inject 0.75 mLs (75 Units total) into the skin 2 (two) times daily. 10 mL 11 Past Week at Unknown time  . isosorbide mononitrate (IMDUR) 60 MG 24 hr tablet Take 1 tablet (60 mg total) by  mouth daily. 90 tablet 3 Past Week at Unknown time  . nitroGLYCERIN (NITROSTAT) 0.4 MG SL tablet Place 1 tablet (0.4 mg total) under the tongue every 5 (five) minutes as needed for chest pain. 25 tablet 3 unknown  . oxyCODONE-acetaminophen (PERCOCET) 10-325 MG per tablet Take 1 tablet by mouth every 4 (four) hours as needed for pain.   Past Week at Unknown time  . predniSONE (DELTASONE) 10 MG tablet Take 10-40 mg by mouth daily with breakfast. Take 4 tablets daily for 3 days, 3 tablets daily for 3 days, 2 tablets daily for 3 days, then 1 tablet daily for 3 days.   Past Week at Unknown time  . vitamin B-12 (CYANOCOBALAMIN) 1000 MCG tablet Take 1,000 mcg by mouth daily.   Past Week at Unknown time  . vitamin C (ASCORBIC ACID) 500 MG tablet Take 1,000 mg by mouth at bedtime.   Past Week at Unknown time  . vitamin E 400 UNIT capsule Take 400 Units by mouth at bedtime.    Past Week at Unknown time   Scheduled: . antiseptic oral rinse  7 mL Mouth Rinse BID  . atorvastatin  80 mg Oral Daily  . carvedilol  6.25 mg Oral BID  . clopidogrel  75 mg Oral Daily  . digoxin  0.25 mg Oral Daily  . docusate sodium  100 mg Oral BID  . feeding supplement (ENSURE COMPLETE)  237 mL Oral BID BM  . furosemide  80 mg Oral BID  . insulin aspart  0-20 Units Subcutaneous TID WC  . insulin aspart  0-5 Units Subcutaneous QHS  . insulin glargine  25 Units Subcutaneous Daily  . isosorbide mononitrate  60 mg Oral Daily  . levofloxacin  500 mg Oral Daily  . potassium chloride SA  20 mEq Oral Daily  . predniSONE  60 mg Oral Q breakfast  . sertraline  50 mg Oral Daily  . sodium chloride  3 mL Intravenous Q12H  . tiotropium  18 mcg Inhalation Daily  . vitamin B-12  1,000 mcg Oral Daily  . vitamin C  1,000 mg Oral QHS  . vitamin E  400 Units Oral QHS   Continuous:  YNW:[id number]:albuterol, ALPRAZolam, diphenhydrAMINE, hydrocortisone cream, nitroGLYCERIN, oxyCODONE-acetaminophen, zolpidem  Assesment: He was admitted with CHF  exacerbation. He has multiple other medical problems. He has anxiety and depression and those are being treated. He will seek counseling with his minister. Active  Problems:   Diabetes mellitus   Chest pain   CHF exacerbation   Tachycardia   Malnutrition of moderate degree    Plan: Discharge home today please see discharge summary for details    LOS: 4 days   Jandiel Magallanes L 05/10/2014, 8:57 AM

## 2014-05-10 NOTE — Progress Notes (Signed)
Inpatient Diabetes Program Recommendations  AACE/ADA: New Consensus Statement on Inpatient Glycemic Control (2013)  Target Ranges:  Prepandial:   less than 140 mg/dL      Peak postprandial:   less than 180 mg/dL (1-2 hours)      Critically ill patients:  140 - 180 mg/dL   Results for Timothy Trujillo, Timothy Trujillo (MRN [id number]) as of 05/10/2014 08:54  Ref. Range 05/09/2014 07:29 05/09/2014 11:46 05/09/2014 16:31 05/09/2014 21:09 05/10/2014 08:19  Glucose-Capillary Latest Range: 70-99 mg/dL 324234 (H) 401201 (H) 027222 (H) 269 (H) 243 (H)   Diabetes history: DM2 Outpatient Diabetes medications: NPH 75 units BID Current orders for Inpatient glycemic control: Latnus 25 units daily, Novolog 0-20 units TID with meals, Novolog 0-5 units QHS  Inpatient Diabetes Program Recommendations Insulin - Basal: Please consider increasing Lantus to 30 units daily. Insulin - Meal Coverage: If steroids will be continued, please consider ordering Novolog 5 units TID with meals for meal coverage (in addition to Novolog correction scale).  Thanks, Orlando PennerMarie Journei Thomassen, RN, MSN, CCRN, CDE Diabetes Coordinator Inpatient Diabetes Program [phone] (Team Pager) [phone] ([facility] office) [phone] [facility] office)

## 2014-05-11 NOTE — Care Management Utilization Note (Signed)
UR completed 

## 2014-06-04 ENCOUNTER — Inpatient Hospital Stay (HOSPITAL_COMMUNITY)
Admission: EM | Admit: 2014-06-04 | Discharge: 2014-06-09 | DRG: 291 | Disposition: A | Discharge status: Skilled Nursing Facility | Payer: Commercial Managed Care - HMO | Attending: Pulmonary Disease | Admitting: Pulmonary Disease

## 2014-06-04 ENCOUNTER — Emergency Department (HOSPITAL_COMMUNITY): Payer: Commercial Managed Care - HMO

## 2014-06-04 ENCOUNTER — Encounter (HOSPITAL_COMMUNITY): Payer: Self-pay | Admitting: *Deleted

## 2014-06-04 DIAGNOSIS — I959 Hypotension, unspecified: Secondary | ICD-10-CM | POA: Diagnosis present

## 2014-06-04 DIAGNOSIS — Z7902 Long term (current) use of antithrombotics/antiplatelets: Secondary | ICD-10-CM | POA: Diagnosis not present

## 2014-06-04 DIAGNOSIS — M1990 Unspecified osteoarthritis, unspecified site: Secondary | ICD-10-CM | POA: Diagnosis present

## 2014-06-04 DIAGNOSIS — J42 Unspecified chronic bronchitis: Secondary | ICD-10-CM

## 2014-06-04 DIAGNOSIS — Z8673 Personal history of transient ischemic attack (TIA), and cerebral infarction without residual deficits: Secondary | ICD-10-CM

## 2014-06-04 DIAGNOSIS — J9621 Acute and chronic respiratory failure with hypoxia: Secondary | ICD-10-CM

## 2014-06-04 DIAGNOSIS — I255 Ischemic cardiomyopathy: Secondary | ICD-10-CM | POA: Diagnosis present

## 2014-06-04 DIAGNOSIS — Z955 Presence of coronary angioplasty implant and graft: Secondary | ICD-10-CM

## 2014-06-04 DIAGNOSIS — I2510 Atherosclerotic heart disease of native coronary artery without angina pectoris: Secondary | ICD-10-CM | POA: Diagnosis present

## 2014-06-04 DIAGNOSIS — Z993 Dependence on wheelchair: Secondary | ICD-10-CM | POA: Diagnosis not present

## 2014-06-04 DIAGNOSIS — J449 Chronic obstructive pulmonary disease, unspecified: Secondary | ICD-10-CM

## 2014-06-04 DIAGNOSIS — I5021 Acute systolic (congestive) heart failure: Secondary | ICD-10-CM | POA: Diagnosis present

## 2014-06-04 DIAGNOSIS — Z792 Long term (current) use of antibiotics: Secondary | ICD-10-CM | POA: Diagnosis not present

## 2014-06-04 DIAGNOSIS — Z8249 Family history of ischemic heart disease and other diseases of the circulatory system: Secondary | ICD-10-CM | POA: Diagnosis not present

## 2014-06-04 DIAGNOSIS — I10 Essential (primary) hypertension: Secondary | ICD-10-CM | POA: Diagnosis present

## 2014-06-04 DIAGNOSIS — J45909 Unspecified asthma, uncomplicated: Secondary | ICD-10-CM | POA: Diagnosis present

## 2014-06-04 DIAGNOSIS — J9620 Acute and chronic respiratory failure, unspecified whether with hypoxia or hypercapnia: Secondary | ICD-10-CM | POA: Diagnosis present

## 2014-06-04 DIAGNOSIS — E1165 Type 2 diabetes mellitus with hyperglycemia: Secondary | ICD-10-CM | POA: Diagnosis present

## 2014-06-04 DIAGNOSIS — Z794 Long term (current) use of insulin: Secondary | ICD-10-CM | POA: Diagnosis not present

## 2014-06-04 DIAGNOSIS — Z7982 Long term (current) use of aspirin: Secondary | ICD-10-CM

## 2014-06-04 DIAGNOSIS — Z951 Presence of aortocoronary bypass graft: Secondary | ICD-10-CM | POA: Diagnosis not present

## 2014-06-04 DIAGNOSIS — E785 Hyperlipidemia, unspecified: Secondary | ICD-10-CM | POA: Diagnosis present

## 2014-06-04 DIAGNOSIS — Z9981 Dependence on supplemental oxygen: Secondary | ICD-10-CM

## 2014-06-04 DIAGNOSIS — I5023 Acute on chronic systolic (congestive) heart failure: Secondary | ICD-10-CM | POA: Diagnosis not present

## 2014-06-04 DIAGNOSIS — Z7952 Long term (current) use of systemic steroids: Secondary | ICD-10-CM | POA: Diagnosis not present

## 2014-06-04 DIAGNOSIS — IMO0002 Reserved for concepts with insufficient information to code with codable children: Secondary | ICD-10-CM | POA: Diagnosis present

## 2014-06-04 DIAGNOSIS — Z9111 Patient's noncompliance with dietary regimen: Secondary | ICD-10-CM | POA: Diagnosis present

## 2014-06-04 DIAGNOSIS — Z515 Encounter for palliative care: Secondary | ICD-10-CM | POA: Diagnosis not present

## 2014-06-04 DIAGNOSIS — Z87891 Personal history of nicotine dependence: Secondary | ICD-10-CM | POA: Diagnosis not present

## 2014-06-04 DIAGNOSIS — Z66 Do not resuscitate: Secondary | ICD-10-CM | POA: Diagnosis present

## 2014-06-04 DIAGNOSIS — D649 Anemia, unspecified: Secondary | ICD-10-CM | POA: Diagnosis present

## 2014-06-04 DIAGNOSIS — R0602 Shortness of breath: Secondary | ICD-10-CM | POA: Diagnosis present

## 2014-06-04 DIAGNOSIS — J628 Pneumoconiosis due to other dust containing silica: Secondary | ICD-10-CM | POA: Diagnosis present

## 2014-06-04 LAB — CBC WITH DIFFERENTIAL/PLATELET
BASOS ABS: 0 10*3/uL (ref 0.0–0.1)
BASOS PCT: 0 % (ref 0–1)
Basophils Absolute: 0 10*3/uL (ref 0.0–0.1)
Basophils Relative: 0 % (ref 0–1)
EOS ABS: 0.2 10*3/uL (ref 0.0–0.7)
Eosinophils Absolute: 0 10*3/uL (ref 0.0–0.7)
Eosinophils Relative: 2 % (ref 0–5)
Eosinophils Relative: 0 % (ref 0–5)
HEMATOCRIT: 30.6 % — AB (ref 39.0–52.0)
HEMATOCRIT: 35.1 % — AB (ref 39.0–52.0)
HEMOGLOBIN: 10.1 g/dL — AB (ref 13.0–17.0)
HEMOGLOBIN: 11.6 g/dL — AB (ref 13.0–17.0)
LYMPHS ABS: 0.3 10*3/uL — AB (ref 0.7–4.0)
Lymphocytes Relative: 10 % — ABNORMAL LOW (ref 12–46)
Lymphocytes Relative: 3 % — ABNORMAL LOW (ref 12–46)
Lymphs Abs: 0.8 10*3/uL (ref 0.7–4.0)
MCH: 29.3 pg (ref 26.0–34.0)
MCH: 29.4 pg (ref 26.0–34.0)
MCHC: 33 g/dL (ref 30.0–36.0)
MCHC: 33 g/dL (ref 30.0–36.0)
MCV: 88.7 fL (ref 78.0–100.0)
MCV: 89.1 fL (ref 78.0–100.0)
MONO ABS: 0.7 10*3/uL (ref 0.1–1.0)
MONO ABS: 0.1 10*3/uL (ref 0.1–1.0)
MONOS PCT: 8 % (ref 3–12)
Monocytes Relative: 1 % — ABNORMAL LOW (ref 3–12)
NEUTROS ABS: 6.8 10*3/uL (ref 1.7–7.7)
NEUTROS PCT: 80 % — AB (ref 43–77)
NEUTROS PCT: 96 % — AB (ref 43–77)
Neutro Abs: 9.6 10*3/uL — ABNORMAL HIGH (ref 1.7–7.7)
PLATELETS: 200 10*3/uL (ref 150–400)
Platelets: 205 10*3/uL (ref 150–400)
RBC: 3.45 MIL/uL — AB (ref 4.22–5.81)
RBC: 3.94 MIL/uL — AB (ref 4.22–5.81)
RDW: 13.9 % (ref 11.5–15.5)
RDW: 13.8 % (ref 11.5–15.5)
WBC: 8.5 10*3/uL (ref 4.0–10.5)
WBC: 10 10*3/uL (ref 4.0–10.5)

## 2014-06-04 LAB — COMPREHENSIVE METABOLIC PANEL
ALT: 9 U/L (ref 0–53)
ANION GAP: 0 — AB (ref 5–15)
AST: 11 U/L (ref 0–37)
Albumin: 3 g/dL — ABNORMAL LOW (ref 3.5–5.2)
Alkaline Phosphatase: 63 U/L (ref 39–117)
BILIRUBIN TOTAL: 0.7 mg/dL (ref 0.3–1.2)
BUN: 15 mg/dL (ref 6–23)
CALCIUM: 7.5 mg/dL — AB (ref 8.4–10.5)
CO2: 28 mmol/L (ref 19–32)
Chloride: 103 mmol/L (ref 96–112)
Creatinine, Ser: 0.86 mg/dL (ref 0.50–1.35)
GFR calc Af Amer: >90 mL/min (ref >90)
GFR calc non Af Amer: 89 mL/min — ABNORMAL LOW (ref >90)
Glucose, Bld: 316 mg/dL — ABNORMAL HIGH (ref 70–99)
POTASSIUM: 3.3 mmol/L — AB (ref 3.5–5.1)
Sodium: 131 mmol/L — ABNORMAL LOW (ref 135–145)
Total Protein: 5.9 g/dL — ABNORMAL LOW (ref 6.0–8.3)

## 2014-06-04 LAB — BASIC METABOLIC PANEL
Anion gap: 8 (ref 5–15)
BUN: 20 mg/dL (ref 6–23)
CO2: 28 mmol/L (ref 19–32)
Calcium: 8.6 mg/dL (ref 8.4–10.5)
Chloride: 95 mmol/L — ABNORMAL LOW (ref 96–112)
Creatinine, Ser: 1.2 mg/dL (ref 0.50–1.35)
GFR calc Af Amer: 72 mL/min — ABNORMAL LOW (ref >90)
GFR, EST NON AFRICAN AMERICAN: 62 mL/min — AB (ref >90)
Glucose, Bld: 486 mg/dL — ABNORMAL HIGH (ref 70–99)
POTASSIUM: 4.2 mmol/L (ref 3.5–5.1)
SODIUM: 131 mmol/L — AB (ref 135–145)

## 2014-06-04 LAB — GLUCOSE, CAPILLARY
GLUCOSE-CAPILLARY: 235 mg/dL — AB (ref 70–99)
GLUCOSE-CAPILLARY: 246 mg/dL — AB (ref 70–99)
Glucose-Capillary: 438 mg/dL — ABNORMAL HIGH (ref 70–99)
Glucose-Capillary: 510 mg/dL — ABNORMAL HIGH (ref 70–99)
Glucose-Capillary: 490 mg/dL — ABNORMAL HIGH (ref 70–99)
Glucose-Capillary: 377 mg/dL — ABNORMAL HIGH (ref 70–99)

## 2014-06-04 LAB — TROPONIN I
Troponin I: 0.08 ng/mL — ABNORMAL HIGH (ref <0.031)
Troponin I: 0.06 ng/mL — ABNORMAL HIGH (ref <0.031)
Troponin I: 0.07 ng/mL — ABNORMAL HIGH (ref <0.031)
Troponin I: 0.2 ng/mL — ABNORMAL HIGH (ref <0.031)

## 2014-06-04 LAB — DIGOXIN LEVEL: Digoxin Level: <0.8 ng/mL — ABNORMAL LOW (ref 0.8–2.0)

## 2014-06-04 LAB — TSH: TSH: 0.504 u[IU]/mL (ref 0.350–4.500)

## 2014-06-04 LAB — CBG MONITORING, ED: Glucose-Capillary: 337 mg/dL — ABNORMAL HIGH (ref 70–99)

## 2014-06-04 LAB — BRAIN NATRIURETIC PEPTIDE: B Natriuretic Peptide: 531 pg/mL — ABNORMAL HIGH (ref 0.0–100.0)

## 2014-06-04 MED ORDER — ASPIRIN EC 325 MG PO TBEC: 650.0000 mg | DELAYED_RELEASE_TABLET | Freq: Four times a day (QID) | ORAL | Status: DC | PRN

## 2014-06-04 MED ORDER — LEVALBUTEROL HCL 0.63 MG/3ML IN NEBU: 0.6300 mg | INHALATION_SOLUTION | Freq: Four times a day (QID) | RESPIRATORY_TRACT | Status: DC | PRN

## 2014-06-04 MED ORDER — ENOXAPARIN SODIUM 40 MG/0.4ML ~~LOC~~ SOLN
40.0000 mg | SUBCUTANEOUS | Status: DC
  Administered 2014-06-04 – 2014-06-09 (×6): 40 mg via SUBCUTANEOUS
  Filled 2014-06-04 (×6): qty 0.4

## 2014-06-04 MED ORDER — CARVEDILOL 3.125 MG PO TABS
6.2500 mg | ORAL_TABLET | Freq: Two times a day (BID) | ORAL | Status: DC
  Administered 2014-06-04 – 2014-06-05 (×3): 6.25 mg via ORAL
  Filled 2014-06-04 (×3): qty 2

## 2014-06-04 MED ORDER — OXYCODONE-ACETAMINOPHEN 5-325 MG PO TABS
1.0000 | ORAL_TABLET | ORAL | Status: DC | PRN
  Administered 2014-06-04 – 2014-06-09 (×9): 1 via ORAL
  Filled 2014-06-04 (×9): qty 1

## 2014-06-04 MED ORDER — HYDROCORTISONE 1 % EX CREA: 1.0000 "application " | TOPICAL_CREAM | Freq: Every day | CUTANEOUS | Status: DC | PRN

## 2014-06-04 MED ORDER — INSULIN ASPART 100 UNIT/ML ~~LOC~~ SOLN
5.0000 [IU] | Freq: Once | SUBCUTANEOUS | Status: AC
  Administered 2014-06-04: 5 [IU] via INTRAVENOUS
  Filled 2014-06-04: qty 1

## 2014-06-04 MED ORDER — VITAMIN B-12 1000 MCG PO TABS
1000.0000 ug | ORAL_TABLET | Freq: Every day | ORAL | Status: DC
  Administered 2014-06-04 – 2014-06-09 (×6): 1000 ug via ORAL
  Filled 2014-06-04 (×6): qty 1

## 2014-06-04 MED ORDER — ACETAMINOPHEN 650 MG RE SUPP: 650.0000 mg | Freq: Four times a day (QID) | RECTAL | Status: DC | PRN

## 2014-06-04 MED ORDER — NITROGLYCERIN 0.4 MG SL SUBL: 0.4000 mg | SUBLINGUAL_TABLET | SUBLINGUAL | Status: DC | PRN

## 2014-06-04 MED ORDER — INSULIN ASPART 100 UNIT/ML ~~LOC~~ SOLN
15.0000 [IU] | Freq: Once | SUBCUTANEOUS | Status: AC
  Administered 2014-06-04: 15 [IU] via SUBCUTANEOUS

## 2014-06-04 MED ORDER — INSULIN NPH (HUMAN) (ISOPHANE) 100 UNIT/ML ~~LOC~~ SUSP
75.0000 [IU] | Freq: Two times a day (BID) | SUBCUTANEOUS | Status: DC
  Filled 2014-06-04: qty 10

## 2014-06-04 MED ORDER — ENSURE COMPLETE PO LIQD
237.0000 mL | Freq: Two times a day (BID) | ORAL | Status: DC
  Administered 2014-06-04 – 2014-06-09 (×11): 237 mL via ORAL

## 2014-06-04 MED ORDER — NITROGLYCERIN 2 % TD OINT
1.0000 [in_us] | TOPICAL_OINTMENT | Freq: Once | TRANSDERMAL | Status: AC
  Administered 2014-06-04: 1 [in_us] via TOPICAL
  Filled 2014-06-04: qty 1

## 2014-06-04 MED ORDER — ONDANSETRON HCL 4 MG PO TABS: 4.0000 mg | ORAL_TABLET | Freq: Four times a day (QID) | ORAL | Status: DC | PRN

## 2014-06-04 MED ORDER — INSULIN ASPART 100 UNIT/ML ~~LOC~~ SOLN: 0.0000 [IU] | Freq: Three times a day (TID) | SUBCUTANEOUS | Status: DC

## 2014-06-04 MED ORDER — FUROSEMIDE 10 MG/ML IJ SOLN
80.0000 mg | Freq: Two times a day (BID) | INTRAMUSCULAR | Status: DC
  Administered 2014-06-04 – 2014-06-05 (×2): 80 mg via INTRAVENOUS
  Filled 2014-06-04 (×2): qty 8

## 2014-06-04 MED ORDER — LEVALBUTEROL HCL 0.63 MG/3ML IN NEBU
0.6300 mg | INHALATION_SOLUTION | Freq: Four times a day (QID) | RESPIRATORY_TRACT | Status: DC
  Administered 2014-06-04 – 2014-06-06 (×9): 0.63 mg via RESPIRATORY_TRACT
  Filled 2014-06-04 (×9): qty 3

## 2014-06-04 MED ORDER — ASPIRIN 81 MG PO CHEW
324.0000 mg | CHEWABLE_TABLET | Freq: Once | ORAL | Status: AC
  Administered 2014-06-04: 324 mg via ORAL
  Filled 2014-06-04: qty 4

## 2014-06-04 MED ORDER — SODIUM CHLORIDE 0.9 % IJ SOLN
3.0000 mL | Freq: Two times a day (BID) | INTRAMUSCULAR | Status: DC
  Administered 2014-06-04 – 2014-06-09 (×9): 3 mL via INTRAVENOUS

## 2014-06-04 MED ORDER — OXYCODONE-ACETAMINOPHEN 10-325 MG PO TABS: 1.0000 | ORAL_TABLET | ORAL | Status: DC | PRN

## 2014-06-04 MED ORDER — INSULIN NPH (HUMAN) (ISOPHANE) 100 UNIT/ML ~~LOC~~ SUSP
50.0000 [IU] | Freq: Two times a day (BID) | SUBCUTANEOUS | Status: DC
  Administered 2014-06-04 – 2014-06-05 (×4): 50 [IU] via SUBCUTANEOUS
  Filled 2014-06-04: qty 10

## 2014-06-04 MED ORDER — ALPRAZOLAM 0.25 MG PO TABS
0.2500 mg | ORAL_TABLET | Freq: Two times a day (BID) | ORAL | Status: DC | PRN
  Administered 2014-06-04 – 2014-06-05 (×2): 0.25 mg via ORAL
  Filled 2014-06-04 (×2): qty 1

## 2014-06-04 MED ORDER — ONDANSETRON HCL 4 MG/2ML IJ SOLN: 4.0000 mg | Freq: Four times a day (QID) | INTRAMUSCULAR | Status: DC | PRN

## 2014-06-04 MED ORDER — DIGOXIN 125 MCG PO TABS
0.2500 mg | ORAL_TABLET | Freq: Every day | ORAL | Status: DC
  Administered 2014-06-04 – 2014-06-09 (×6): 0.25 mg via ORAL
  Filled 2014-06-04 (×6): qty 2

## 2014-06-04 MED ORDER — ALBUTEROL SULFATE (2.5 MG/3ML) 0.083% IN NEBU
5.0000 mg | INHALATION_SOLUTION | Freq: Once | RESPIRATORY_TRACT | Status: AC
  Administered 2014-06-04: 5 mg via RESPIRATORY_TRACT
  Filled 2014-06-04: qty 6

## 2014-06-04 MED ORDER — POTASSIUM CHLORIDE CRYS ER 20 MEQ PO TBCR
40.0000 meq | EXTENDED_RELEASE_TABLET | Freq: Once | ORAL | Status: AC
  Administered 2014-06-04: 40 meq via ORAL
  Filled 2014-06-04: qty 2

## 2014-06-04 MED ORDER — IPRATROPIUM BROMIDE 0.02 % IN SOLN
0.5000 mg | Freq: Once | RESPIRATORY_TRACT | Status: AC
  Administered 2014-06-04: 0.5 mg via RESPIRATORY_TRACT
  Filled 2014-06-04: qty 2.5

## 2014-06-04 MED ORDER — PREDNISONE 10 MG PO TABS
10.0000 mg | ORAL_TABLET | Freq: Every day | ORAL | Status: DC
  Administered 2014-06-04 – 2014-06-09 (×6): 10 mg via ORAL
  Filled 2014-06-04 (×6): qty 1

## 2014-06-04 MED ORDER — FUROSEMIDE 10 MG/ML IJ SOLN
80.0000 mg | Freq: Once | INTRAMUSCULAR | Status: AC
  Administered 2014-06-04: 80 mg via INTRAVENOUS
  Filled 2014-06-04: qty 8

## 2014-06-04 MED ORDER — SERTRALINE HCL 50 MG PO TABS
50.0000 mg | ORAL_TABLET | Freq: Every day | ORAL | Status: DC
  Administered 2014-06-04 – 2014-06-09 (×6): 50 mg via ORAL
  Filled 2014-06-04 (×6): qty 1

## 2014-06-04 MED ORDER — INSULIN ASPART 100 UNIT/ML ~~LOC~~ SOLN
0.0000 [IU] | Freq: Three times a day (TID) | SUBCUTANEOUS | Status: DC
  Administered 2014-06-04: 20 [IU] via SUBCUTANEOUS
  Administered 2014-06-04: 7 [IU] via SUBCUTANEOUS
  Administered 2014-06-04: 20 [IU] via SUBCUTANEOUS
  Administered 2014-06-05: 3 [IU] via SUBCUTANEOUS
  Administered 2014-06-05: 4 [IU] via SUBCUTANEOUS

## 2014-06-04 MED ORDER — INSULIN ASPART 100 UNIT/ML ~~LOC~~ SOLN
0.0000 [IU] | Freq: Every day | SUBCUTANEOUS | Status: DC
  Administered 2014-06-04: 3 [IU] via SUBCUTANEOUS

## 2014-06-04 MED ORDER — CLOPIDOGREL BISULFATE 75 MG PO TABS
75.0000 mg | ORAL_TABLET | Freq: Every day | ORAL | Status: DC
  Administered 2014-06-04 – 2014-06-09 (×6): 75 mg via ORAL
  Filled 2014-06-04 (×6): qty 1

## 2014-06-04 MED ORDER — CETYLPYRIDINIUM CHLORIDE 0.05 % MT LIQD
7.0000 mL | Freq: Two times a day (BID) | OROMUCOSAL | Status: DC
  Administered 2014-06-04 – 2014-06-09 (×11): 7 mL via OROMUCOSAL

## 2014-06-04 MED ORDER — OXYCODONE HCL 5 MG PO TABS
5.0000 mg | ORAL_TABLET | ORAL | Status: DC | PRN
  Administered 2014-06-04 – 2014-06-09 (×10): 5 mg via ORAL
  Filled 2014-06-04 (×10): qty 1

## 2014-06-04 MED ORDER — BUDESONIDE 0.25 MG/2ML IN SUSP
0.2500 mg | Freq: Two times a day (BID) | RESPIRATORY_TRACT | Status: DC
  Administered 2014-06-04 – 2014-06-09 (×10): 0.25 mg via RESPIRATORY_TRACT
  Filled 2014-06-04 (×13): qty 2

## 2014-06-04 MED ORDER — DIPHENHYDRAMINE HCL 25 MG PO CAPS
50.0000 mg | ORAL_CAPSULE | Freq: Four times a day (QID) | ORAL | Status: DC | PRN
  Administered 2014-06-04 – 2014-06-09 (×12): 50 mg via ORAL
  Filled 2014-06-04 (×12): qty 2

## 2014-06-04 MED ORDER — ISOSORBIDE MONONITRATE ER 60 MG PO TB24
60.0000 mg | ORAL_TABLET | Freq: Every day | ORAL | Status: DC
  Administered 2014-06-05 – 2014-06-09 (×5): 60 mg via ORAL
  Filled 2014-06-04 (×5): qty 1

## 2014-06-04 MED ORDER — SODIUM CHLORIDE 0.9 % IJ SOLN
3.0000 mL | Freq: Two times a day (BID) | INTRAMUSCULAR | Status: DC
  Administered 2014-06-04 – 2014-06-07 (×4): 3 mL via INTRAVENOUS

## 2014-06-04 MED ORDER — METHYLPREDNISOLONE SODIUM SUCC 125 MG IJ SOLR
125.0000 mg | Freq: Once | INTRAMUSCULAR | Status: AC
  Administered 2014-06-04: 125 mg via INTRAVENOUS
  Filled 2014-06-04: qty 2

## 2014-06-04 MED ORDER — VITAMIN E 180 MG (400 UNIT) PO CAPS
400.0000 [IU] | ORAL_CAPSULE | Freq: Every day | ORAL | Status: DC
  Administered 2014-06-04 – 2014-06-08 (×5): 400 [IU] via ORAL
  Filled 2014-06-04 (×6): qty 1

## 2014-06-04 MED ORDER — ASPIRIN 325 MG PO TABS
325.0000 mg | ORAL_TABLET | Freq: Every day | ORAL | Status: DC
  Administered 2014-06-04 – 2014-06-06 (×3): 325 mg via ORAL
  Filled 2014-06-04 (×3): qty 1

## 2014-06-04 MED ORDER — ACETAMINOPHEN 325 MG PO TABS: 650.0000 mg | ORAL_TABLET | Freq: Four times a day (QID) | ORAL | Status: DC | PRN

## 2014-06-04 MED ORDER — IPRATROPIUM BROMIDE 0.02 % IN SOLN
0.5000 mg | Freq: Four times a day (QID) | RESPIRATORY_TRACT | Status: DC
  Administered 2014-06-04 – 2014-06-06 (×8): 0.5 mg via RESPIRATORY_TRACT
  Filled 2014-06-04 (×8): qty 2.5

## 2014-06-04 MED ORDER — ATORVASTATIN CALCIUM 40 MG PO TABS
80.0000 mg | ORAL_TABLET | Freq: Every day | ORAL | Status: DC
  Administered 2014-06-04 – 2014-06-08 (×5): 80 mg via ORAL
  Filled 2014-06-04 (×5): qty 2

## 2014-06-04 NOTE — ED Notes (Signed)
Pt. Reports feeling light headed and difficulty focusing. EDP notified.

## 2014-06-04 NOTE — Progress Notes (Signed)
INITIAL NUTRITION ASSESSMENT  DOCUMENTATION CODES Per approved criteria  -Not Applicable   INTERVENTION: -Ensure Complete po BID, each supplement provides 350 kcal and 13 grams of protein   -Last HA1C was 10.6  And currently having CBGs >300. Assess need for education  NUTRITION DIAGNOSIS: Inadequate oral intake related to trouble breathing as evidenced by not eating anything for past 2 days  Goal: Pt to meet >/= 90% of their estimated nutrition needs   Monitor:  Oral intake, fluid status, labs, procedures  Reason for Assessment: MST of 4 5  64 y.o. male  Admitting Dx: Acute on chronic respiratory failure  ASSESSMENT: Timothy Trujillo is a 64 y.o. male with history of severe COPD, CAD , ischemic cardiomyopathy, diabetes mellitus type 2, and chronic tachycardia presents to the ER because of sudden onset of worsening shortness of breath since last evening  Per notes, Pt has not eaten in 2 days and has been cutting back on fluids   Height: Ht Readings from Last 1 Encounters:  06/04/14  (1.905 m)    Weight: Wt Readings from Last 1 Encounters:  06/04/14 228 lb (103.42 kg)    Ideal Body Weight: 196 lbs  % Ideal Body Weight: 116%  Wt Readings from Last 10 Encounters:  06/04/14 228 lb (103.42 kg)  05/10/14 227 lb 8 oz (103.193 kg)  04/25/14 232 lb (105.235 kg)  03/21/14 242 lb (109.77 kg)  02/26/14 240 lb 9.6 oz (109.135 kg)  12/14/13 262 lb (118.842 kg)  09/10/13 252 lb (114.306 kg)  08/10/13 260 lb (117.935 kg)  08/01/13 265 lb 10.5 oz (120.5 kg)  07/05/13 254 lb (115.214 kg)  wt loss not significant  Usual Body Weight: Appears to be 250-260 lbs  % Usual Body Weight: 91%  BMI:  Body mass index is 28.5 kg/(m^2).  Estimated Nutritional Needs: Kcal: 1850-2350 (18-23 kcal/kg) Protein: 103-113g Pro (1-1.1 g/kg) Fluid: Per MD  Skin: WDL  Diet Order: Diet heart healthy/carb modified  EDUCATION NEEDS: -No education needs identified at this  time   Intake/Output Summary (Last 24 hours) at 06/04/14 1358 Last data filed at 06/04/14 0900  Gross per 24 hour  Intake    360 ml  Output    300 ml  Net     60 ml    Last BM: 2/26  Labs:   Recent Labs Lab 06/04/14 0325 06/04/14 0848  NA 131* 131*  K 3.3* 4.2  CL 103 95*  CO2 28 28  BUN 15 20  CREATININE 0.86 1.20  CALCIUM 7.5* 8.6  GLUCOSE 316* 486*  A1C 10.6  CBG (last 3)   Recent Labs  06/04/14 0810 06/04/14 1010 06/04/14 1153  GLUCAP 438* 510* 490*    Scheduled Meds: . antiseptic oral rinse  7 mL Mouth Rinse BID  . aspirin  325 mg Oral Daily  . atorvastatin  80 mg Oral q1800  . budesonide (PULMICORT) nebulizer solution  0.25 mg Nebulization BID  . carvedilol  6.25 mg Oral BID  . clopidogrel  75 mg Oral Daily  . digoxin  0.25 mg Oral Daily  . enoxaparin (LOVENOX) injection  40 mg Subcutaneous Q24H  . feeding supplement (ENSURE COMPLETE)  237 mL Oral BID BM  . furosemide  80 mg Intravenous Q12H  . insulin aspart  0-20 Units Subcutaneous TID WC  . insulin aspart  0-5 Units Subcutaneous QHS  . insulin NPH Human  50 Units Subcutaneous BID  . ipratropium  0.5 mg Nebulization Q6H  .  isosorbide mononitrate  60 mg Oral Daily  . levalbuterol  0.63 mg Nebulization Q6H  . predniSONE  10 mg Oral Q breakfast  . sertraline  50 mg Oral Daily  . sodium chloride  3 mL Intravenous Q12H  . sodium chloride  3 mL Intravenous Q12H  . vitamin B-12  1,000 mcg Oral Daily  . vitamin E  400 Units Oral QHS    Continuous Infusions:   Past Medical History  Diagnosis Date  . Uncontrolled diabetes mellitus     a. A1C 12.8 in 01/2013.  [name]. Chronic systolic heart failure   . Obesity   . Hyperlipidemia   . Peripheral vascular disease   . Hypertension   . Asthma   . CAD (coronary artery disease)     a. Prior CABG 20 yrs ago (?~1994). b. Hx of multiple stents, prev [place]. c. [place] 01[place]: cath moderate disease in LAD, distal LCx, prox RCA occluded, 2 SVGs  occluded, felt to be stable from prior cath -> for medical therapy.  . Ischemic cardiomyopathy     a. EF 2012: 55-60. b. EF 12/2011: 25-30%. c. Echo 01/2013: EF 15-20%.  . Stroke     15 years ago  . Arthritis   . Anxiety   . Depression   . 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 Chronic respiratory failure     a. Due to COPD.  . Osteomyelitis 2013    was planned for chronic suppressive antibiotics but cannot afford this any longer  . DNR no code (do not resuscitate) 07/2013  . CHF (congestive heart failure)   . Diabetes mellitus without complication     Past Surgical History  Procedure Laterality Date  . Coronary artery bypass graft    . Coronary stent placement  2008    CABG grafts closed  . Joint replacement    . Cholecystectomy    . Eye surgery    . I&d extremity  06/12/2011    Procedure: IRRIGATION AND DEBRIDEMENT EXTREMITY;  Surgeon: Javier DockerJeffrey C Beane, MD;  Location: [facility] OR;  Service: Orthopedics;  Laterality: Right;  I&D Right [place]  . Left and right heart catheterization with coronary/graft angiogram N/A 01/15/2013    Procedure: LEFT AND RIGHT HEART CATHETERIZATION WITH Isabel CapriceORONARY/GRAFT ANGIOGRAM;  Surgeon: Alvia GroveJr Philip J Nahser, MD;  [facility];  Service: Cardiovascular;  Laterality: N/A;    Timothy LouisNathan Agueda Houpt RD, LDN Nutrition Pager: [phone] 06/04/2014 1:58 PM

## 2014-06-04 NOTE — H&P (Addendum)
[facility] History and Physical  Timothy Trujillo ZOX:[id number] DOB: [birth year] DOA: 06/04/2014  Referring physician: ER physician. PCP: Fredirick Maudlin, MD   Chief Complaint: Shortness of breath and chest pain.  HPI: Timothy Trujillo is a 64 y.o. male with history of severe COPD, CAD status post CABG last cardiac cath in October 2014, ischemic cardiomyopathy last year measured last month January 2016 was 20-25%, diabetes mellitus type 2, chronic tachycardia presents to the ER because of sudden onset of worsening shortness of breath since last evening. Patient also has been having chest pain around left side after the shortness of breath started. In the ER patient had chest x-ray which shows moderate CHF and patient was given Lasix 60 mg IV. Patient was given nitroglycerin patch following which patient blood pressure drop and has been removed at this time. On exam patient does not have any acute wheezing or peripheral edema. Patient will be admitted for acute on chronic respiratory failure secondary to CHF.  Review of Systems: As presented in the history of presenting illness, rest negative.  Past Medical History  Diagnosis Date  . Uncontrolled diabetes mellitus     a. A1C 12.8 in 01/2013.  [name] Chronic systolic heart failure   . Obesity   . Hyperlipidemia   . Peripheral vascular disease   . Hypertension   . Asthma   . CAD (coronary artery disease)     a. Prior CABG 20 yrs ago (?~1994). b. Hx of multiple stents, prev followed in [place]. c. [place] 01/2013: cath moderate disease in LAD, distal LCx, prox RCA occluded, 2 SVGs occluded, felt to be stable from prior cath -> for medical therapy.  . Ischemic cardiomyopathy     a. EF 2012: 55-60. b. EF 12/2011: 25-30%. c. Echo 01/2013: EF 15-20%.  . Stroke     15 years ago  . Arthritis   . Anxiety   . Depression   . 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 Chronic respiratory failure     a.  Due to COPD.  . Osteomyelitis 2013    was planned for chronic suppressive antibiotics but cannot afford this any longer  . DNR no code (do not resuscitate) 07/2013  . CHF (congestive heart failure)   . Diabetes mellitus without complication    Past Surgical History  Procedure Laterality Date  . Coronary artery bypass graft    . Coronary stent placement  2008    CABG grafts closed  . Joint replacement    . Cholecystectomy    . Eye surgery    . I&d extremity  06/12/2011    Procedure: IRRIGATION AND DEBRIDEMENT EXTREMITY;  Surgeon: Javier Docker, MD;  Location: [facility] OR;  Service: Orthopedics;  Laterality: Right;  I&D Right [place]  . Left and right heart catheterization with coronary/graft angiogram N/A 01/15/2013    Procedure: LEFT AND RIGHT HEART CATHETERIZATION WITH Isabel Caprice;  Surgeon: Alvia Grove, MD;  Location: [facility] CATH LAB;  Service: Cardiovascular;  Laterality: N/A;   Social History:  reports that he quit smoking about 31 years ago. He started smoking about 57 years ago. He has never used smokeless tobacco. He reports that he does not drink alcohol or use illicit drugs. Where does patient lives home. Can patient participate in ADLs?  yes.  Allergies  Allergen Reactions  . Baycol [Cerivastatin] Other (See Comments)    Patient states it "about killed me"  kidney shut down. Black stools.  . Stadol [Butorphanol Tartrate] Other (See Comments)    "makes him crazy"    Family History:  Family History  Problem Relation Age of Onset  . Dementia Mother   . Arthritis Mother   . Heart disease Father   . Heart disease Brother       Prior to Admission medications   Medication Sig Start Date End Date Taking? Authorizing Provider  ALPRAZolam (XANAX) 0.25 MG tablet Take 1 tablet (0.25 mg total) by mouth 2 (two) times daily as needed for anxiety. 05/10/14   Fredirick MaudlinEdward L Hawkins, MD  aspirin 325 MG tablet Take 325 mg by mouth daily.    Historical Provider, MD   Aspirin-Salicylamide-Caffeine (BC FAST PAIN RELIEF ARTHRITIS) [phone] MG PACK Take 1 packet by mouth 3 (three) times daily as needed (for pain).    Historical Provider, MD  aspirin-sod bicarb-citric acid (ALKA-SELTZER) 325 MG TBEF tablet Take 650 mg by mouth every 6 (six) hours as needed (pain).    Historical Provider, MD  atorvastatin (LIPITOR) 80 MG tablet Take 1 tablet (80 mg total) by mouth daily. 03/11/14   Antoine PocheJonathan F Branch, MD  carvedilol (COREG) 6.25 MG tablet Take 1 tablet (6.25 mg total) by mouth 2 (two) times daily. 04/25/14   Jodelle GrossKathryn M Lawrence, NP  cephALEXin (KEFLEX) 500 MG capsule Take 1 capsule by mouth 3 (three) times daily. 05/03/14   Historical Provider, MD  cephALEXin (KEFLEX) 500 MG capsule Take 1 capsule (500 mg total) by mouth 3 (three) times daily. 05/10/14   Fredirick MaudlinEdward L Hawkins, MD  clopidogrel (PLAVIX) 75 MG tablet Take 1 tablet (75 mg total) by mouth daily. 04/25/14   Jodelle GrossKathryn M Lawrence, NP  digoxin (LANOXIN) 0.25 MG tablet Take 1 tablet (0.25 mg total) by mouth daily. 04/22/13   Fredirick MaudlinEdward L Hawkins, MD  diphenhydrAMINE (BENADRYL) 50 MG tablet Take 1 tablet (50 mg total) by mouth every 6 (six) hours as needed for itching. 05/10/14   Fredirick MaudlinEdward L Hawkins, MD  furosemide (LASIX) 80 MG tablet Take 80 mg by mouth 2 (two) times daily as needed for fluid.     Historical Provider, MD  hydrocortisone 2.5 % cream Apply 1 application topically daily as needed (itching).  04/25/14   Historical Provider, MD  insulin NPH Human (HUMULIN N,NOVOLIN N) 100 UNIT/ML injection Inject 0.75 mLs (75 Units total) into the skin 2 (two) times daily. 03/01/14   Fredirick MaudlinEdward L Hawkins, MD  isosorbide mononitrate (IMDUR) 60 MG 24 hr tablet Take 1 tablet (60 mg total) by mouth daily. 04/25/14   Jodelle GrossKathryn M Lawrence, NP  nitroGLYCERIN (NITROSTAT) 0.4 MG SL tablet Place 1 tablet (0.4 mg total) under the tongue every 5 (five) minutes as needed for chest pain. 04/25/14   Jodelle GrossKathryn M Lawrence, NP  oxyCODONE-acetaminophen (PERCOCET)  10-325 MG per tablet Take 1 tablet by mouth every 4 (four) hours as needed for pain.    Historical Provider, MD  predniSONE (DELTASONE) 10 MG tablet Take 10-40 mg by mouth daily with breakfast. Take 4 tablets daily for 3 days, 3 tablets daily for 3 days, 2 tablets daily for 3 days, then 1 tablet daily for 3 days. 05/03/14   Historical Provider, MD  predniSONE (DELTASONE) 10 MG tablet Take 1 tablet (10 mg total) by mouth daily with breakfast. 05/10/14   Fredirick MaudlinEdward L Hawkins, MD  sertraline (ZOLOFT) 50 MG tablet Take 1 tablet (50 mg total) by mouth daily. 05/10/14   Fredirick MaudlinEdward L Hawkins, MD  vitamin B-12 (CYANOCOBALAMIN)  1000 MCG tablet Take 1,000 mcg by mouth daily.    Historical Provider, MD  vitamin C (ASCORBIC ACID) 500 MG tablet Take 1,000 mg by mouth at bedtime.    Historical Provider, MD  vitamin E 400 UNIT capsule Take 400 Units by mouth at bedtime.     Historical Provider, MD    Physical Exam: Filed Vitals:   06/04/14 0615 06/04/14 0630 06/04/14 0712 06/04/14 0717  BP:  106/83 112/70   Pulse:  137 126   Temp: 98.6 F (37 C)   98.3 F (36.8 C)  TempSrc: Oral     Resp:  19 18   Height:      Weight:      SpO2:  96% 96%      General:  Moderately built and nourished.  Eyes: Anicteric no pallor.  ENT: No discharge from ears eyes nose and mouth.  Neck: No mass felt. No JVD appreciated.  Cardiovascular: S1-S2 heard tachycardic.  Respiratory: No rhonchi or crepitations. Distant breath sounds.  Abdomen: Soft nontender bowel sounds present.  Skin: Chronic skin changes.  Musculoskeletal: No edema.  Psychiatric: Appears normal.  Neurologic: Alert awake oriented to time place and person. Moves all extremities.  Labs on Admission:  Basic Metabolic Panel:  Recent Labs Lab 06/04/14 0325  NA 131*  K 3.3*  CL 103  CO2 28  GLUCOSE 316*  BUN 15  CREATININE 0.86  CALCIUM 7.5*   Liver Function Tests:  Recent Labs Lab 06/04/14 0325  AST 11  ALT 9  ALKPHOS 63  BILITOT 0.7   PROT 5.9*  ALBUMIN 3.0*   No results for input(s): LIPASE, AMYLASE in the last 168 hours. No results for input(s): AMMONIA in the last 168 hours. CBC:  Recent Labs Lab 06/04/14 0325  WBC 8.5  NEUTROABS 6.8  HGB 10.1*  HCT 30.6*  MCV 88.7  PLT 200   Cardiac Enzymes:  Recent Labs Lab 06/04/14 0325  TROPONINI 0.08*    BNP (last 3 results)  Recent Labs  05/06/14 2355 06/04/14 0325  BNP 646.0* 531.0*    ProBNP (last 3 results)  Recent Labs  07/30/13 1523 02/25/14 2200  PROBNP 604.2* 1014.0*    CBG:  Recent Labs Lab 06/04/14 0332  GLUCAP 337*    Radiological Exams on Admission: Dg Chest Portable 1 View  06/04/2014   CLINICAL DATA:  Shortness of breath and sharp constant chest pain. Symptoms for 1 day.  EXAM: PORTABLE CHEST - 1 VIEW  COMPARISON:  Radiographs and CT 05/07/2014  FINDINGS: Patient is post median sternotomy. Cardiomegaly is slightly decreased. Pulmonary edema has progressed from prior. Blunting of both costophrenic angles, suspect small pleural effusions. No pneumothorax. No confluent airspace disease.  IMPRESSION: Moderate CHF.   Electronically Signed   By: Rubye Oaks M.D.   On: 06/04/2014 04:58    EKG: Independently reviewed. Sinus tachycardia.  Assessment/Plan Principal Problem:   Acute on chronic respiratory failure Active Problems:   COPD (chronic obstructive pulmonary disease)   Acute systolic CHF (congestive heart failure)   Diabetes mellitus type 2, uncontrolled   1. Acute on chronic respiratory failure secondary to decompensated CHF last EF measured in January 2016 was 20-25% - patient was given Lasix 60 mg IV 1 dose and I have placed patient on Lasix 80 mg IV every 12 hourly. Patient also has compound of COPD which is worsening patient's shortness of breath. Patient was given Solu-Medrol and placed on nebulizer in the ER. At this time I will continue  with Xopenex and Atrovent along with Pulmicort. Patient is on chronic  prednisone therapy which will be continued. The patient has obvious wheezes then may continue with IV Solu-Medrol. Possible intake output metabolic panel daily weights. Patient is not on ACE or ARB secondary to low blood pressure. 2. Chest pain with history of CAD status post CABG - last cardiac cath in October 2014 and as per the primary care physician's notes patient is not wanting to do any further cardiac. Patient is medically managed. Continue antiplatelet agents statins and beta blockers. Cycle cardiac markers. Patient has chronically elevated troponin. 3. COPD on home oxygen - see #1. 4. Chronic sinus tachycardia - patient is on beta blockers and digoxin. Check digoxin levels and TSH. 5. Diabetes mellitus type 2 uncontrolled - probably secondary to patient receiving IV steroids. Continue patient's home dose of insulin along with sliding scale coverage. 6. Anemia - follow CBC.   DVT Prophylaxis Lovenox.  Code Status: DO NOT RESUSCITATE.  Family Communication: Patient's son at the bedside.  Disposition Plan: Admit to inpatient.    Takeria Marquina N. [facility] Pager [phone].  If 7PM-7AM, please contact night-coverage [email or web address] Password [email or web address] 06/04/2014, 7:22 AM

## 2014-06-04 NOTE — Progress Notes (Signed)
This is an assumption of care note. He was admitted early this morning with heart failure. He says he feels a little better. His blood sugar is up and he takes large doses of NPH insulin at home. I have cut that dose somewhat because I'm concerned that he may develop hypoglycemia. He will continue with Lasix IV.

## 2014-06-04 NOTE — Plan of Care (Signed)
Problem: Consults Goal: Heart Failure Patient Education (See Patient Education module for education specifics.) Outcome: Completed/Met Date Met:  06/04/14 Packet on heart reviewed and given to patient.  Problem: Phase I Progression Outcomes Goal: Pain controlled with appropriate interventions O2 saturations remaining greater than 90% at rest on 4L; however, patient feels "winded" after talking.

## 2014-06-04 NOTE — ED Notes (Signed)
Pt reporting chest pain and SOB throughout day.  Reports he did take 3 nitro before calling EMS.  Pt somewhat lethargic upon arrival.  Per EMS blood sugar was 502.

## 2014-06-04 NOTE — ED Provider Notes (Signed)
CSN: [id number]     Arrival date & time 06/04/14  0305 History   First MD Initiated Contact with Patient 06/04/14 [phone]     Chief Complaint  Patient presents with  . Shortness of Breath     (Consider location/radiation/quality/duration/timing/severity/associated sxs/prior Treatment) HPI   Patient states he started having shortness of breath and chest pain earlier today and the pain as being constant. He also states he has a dry cough and has had chills without documented fever. He states his chest is hurting in the left side and he describes as a pressure. He states he was having wheezing this morning but not now. He states also when he breathes he hears a bubbling sensation. He has a history of congestive heart failure but he denies any swelling of his legs. He has been having dyspnea on exertion with minimal effort. He does not have a nebulizer or inhaler to use at home. He states he hasn't eaten in 2 days and he has been cutting back his fluids because of his congestive heart failure. He states he chronically uses oxygen 3 L/m nasal cannula however today he had his grandson up at 24 because his breathing was so short.  PCP Dr Juanetta GoslingHawkins Cardiologist Dr. Wyline MoodBranch   Past Medical History  Diagnosis Date  . Uncontrolled diabetes mellitus     a. A1C 12.8 in 01/2013.  [name]. Chronic systolic heart failure   . Obesity   . Hyperlipidemia   . Peripheral vascular disease   . Hypertension   . Asthma   . CAD (coronary artery disease)     a. Prior CABG 20 yrs ago (?~1994). b. H[place]: cath moderate disease in LAD, distal LCx, prox RCA occluded, 2 SVGs occluded, felt to be stable from prior cath -> for medical therapy.  . Ischemic cardiomyopathy     a. EF 2012: 55-60. b. EF 12/2011: 25-30%. c. Echo 01/2013: EF 15-20%.  . Stroke     15 years ago  . Arthritis   . Anxiety   . Depression   . 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 Chronic respiratory failure     a. Due to COPD.  . Osteomyelitis 2013    was planned for chronic suppressive antibiotics but cannot afford this any longer  . DNR no code (do not resuscitate) 07/2013  . CHF (congestive heart failure)   . Diabetes mellitus without complication    Past Surgical History  Procedure Laterality Date  . Coronary artery bypass graft    . Coronary stent placement  2008    CABG grafts closed  . Joint replacement    . Cholecystectomy    . Eye surgery    . I&d extremity  06/12/2011    Procedure: IRRIGATION AND DEBRIDEMENT EXTREMITY;  Surgeon: Javier DockerJeffrey C Beane, MD;  Location: [facility] OR;  Service: Orthopedics;  Laterality: Right;  I&D Right Tibia  . Left and right heart catheterization with coronary/graft angiogram N/A 01/15/2013    Procedure: LEFT AND RIGHT HEART CATHETERIZATION WITH Isabel CapriceORONARY/GRAFT ANGIOGRAM;  Surgeon: Alvia GroveJr Philip J Nahser, MD;  [facility];  Service: Cardiovascular;  Laterality: N/A;   Family History  Problem Relation Age of Onset  . Dementia Mother   . Arthritis Mother   . Heart disease Father   . Heart disease Brother    History  Substance Use Topics  .  Smoking status: Former Smoker -- 3.00 packs/day for 26 years    Start date: 04/08/1957    Quit date: 04/09/1983  . Smokeless tobacco: Never Used  . Alcohol Use: No   lives at home States his grandson stays with him on the weekends Uses O cane, walker, and has a small motorized wheelchair Patient uses oxygen 3 L/m nasal cannula 24 /7  Review of Systems  All other systems reviewed and are negative.     Allergies  Baycol and Stadol  Home Medications   Prior to Admission medications   Medication Sig Start Date End Date Taking? Authorizing Provider  ALPRAZolam (XANAX) 0.25 MG tablet Take 1 tablet (0.25 mg total) by mouth 2 (two) times daily as needed for anxiety. 05/10/14   Fredirick Maudlin, MD  aspirin 325 MG tablet Take 325 mg by mouth daily.     Historical Provider, MD  Aspirin-Salicylamide-Caffeine (BC FAST PAIN RELIEF ARTHRITIS) [id number] MG PACK Take 1 packet by mouth 3 (three) times daily as needed (for pain).    Historical Provider, MD  aspirin-sod bicarb-citric acid (ALKA-SELTZER) 325 MG TBEF tablet Take 650 mg by mouth every 6 (six) hours as needed (pain).    Historical Provider, MD  atorvastatin (LIPITOR) 80 MG tablet Take 1 tablet (80 mg total) by mouth daily. 03/11/14   Antoine Poche, MD  carvedilol (COREG) 6.25 MG tablet Take 1 tablet (6.25 mg total) by mouth 2 (two) times daily. 04/25/14   Jodelle Gross, NP  cephALEXin (KEFLEX) 500 MG capsule Take 1 capsule by mouth 3 (three) times daily. 05/03/14   Historical Provider, MD  cephALEXin (KEFLEX) 500 MG capsule Take 1 capsule (500 mg total) by mouth 3 (three) times daily. 05/10/14   Fredirick Maudlin, MD  clopidogrel (PLAVIX) 75 MG tablet Take 1 tablet (75 mg total) by mouth daily. 04/25/14   Jodelle Gross, NP  digoxin (LANOXIN) 0.25 MG tablet Take 1 tablet (0.25 mg total) by mouth daily. 04/22/13   Fredirick Maudlin, MD  diphenhydrAMINE (BENADRYL) 50 MG tablet Take 1 tablet (50 mg total) by mouth every 6 (six) hours as needed for itching. 05/10/14   Fredirick Maudlin, MD  furosemide (LASIX) 80 MG tablet Take 80 mg by mouth 2 (two) times daily as needed for fluid.     Historical Provider, MD  hydrocortisone 2.5 % cream Apply 1 application topically daily as needed (itching).  04/25/14   Historical Provider, MD  insulin NPH Human (HUMULIN N,NOVOLIN N) 100 UNIT/ML injection Inject 0.75 mLs (75 Units total) into the skin 2 (two) times daily. 03/01/14   Fredirick Maudlin, MD  isosorbide mononitrate (IMDUR) 60 MG 24 hr tablet Take 1 tablet (60 mg total) by mouth daily. 04/25/14   Jodelle Gross, NP  nitroGLYCERIN (NITROSTAT) 0.4 MG SL tablet Place 1 tablet (0.4 mg total) under the tongue every 5 (five) minutes as needed for chest pain. 04/25/14   Jodelle Gross, NP   oxyCODONE-acetaminophen (PERCOCET) 10-325 MG per tablet Take 1 tablet by mouth every 4 (four) hours as needed for pain.    Historical Provider, MD  predniSONE (DELTASONE) 10 MG tablet Take 10-40 mg by mouth daily with breakfast. Take 4 tablets daily for 3 days, 3 tablets daily for 3 days, 2 tablets daily for 3 days, then 1 tablet daily for 3 days. 05/03/14   Historical Provider, MD  predniSONE (DELTASONE) 10 MG tablet Take 1 tablet (10 mg total) by mouth daily with breakfast. 05/10/14  Fredirick Maudlin, MD  sertraline (ZOLOFT) 50 MG tablet Take 1 tablet (50 mg total) by mouth daily. 05/10/14   Fredirick Maudlin, MD  vitamin B-12 (CYANOCOBALAMIN) 1000 MCG tablet Take 1,000 mcg by mouth daily.    Historical Provider, MD  vitamin C (ASCORBIC ACID) 500 MG tablet Take 1,000 mg by mouth at bedtime.    Historical Provider, MD  vitamin E 400 UNIT capsule Take 400 Units by mouth at bedtime.     Historical Provider, MD   ED Triage Vitals  Enc Vitals Group     BP 06/04/14 0318 104/75 mmHg     Pulse Rate 06/04/14 0318 120     Resp 06/04/14 0318 21     Temp 06/04/14 0318 98.1 F (36.7 C)     Temp Source 06/04/14 0318 Oral     SpO2 06/04/14 0318 96 %     Weight 06/04/14 0318 225 lb (102.059 kg)     Height 06/04/14 0318  (1.905 m)     Head Cir --      Peak Flow --      Pain Score 06/04/14 0308 5     Pain Loc --      Pain Edu? --      Excl. in GC? --    Vital signs normal except tachycardia   Physical Exam  Constitutional: He is oriented to person, place, and time. He appears well-developed and well-nourished.  Non-toxic appearance. He does not appear ill. No distress.  HENT:  Head: Normocephalic and atraumatic.  Right Ear: External ear normal.  Left Ear: External ear normal.  Nose: Nose normal. No mucosal edema or rhinorrhea.  Mouth/Throat: Oropharynx is clear and moist and mucous membranes are normal. No dental abscesses or uvula swelling.  Eyes: Conjunctivae and EOM are normal. Pupils are  equal, round, and reactive to light.  Neck: Normal range of motion and full passive range of motion without pain. Neck supple.  Cardiovascular: Normal rate, regular rhythm and normal heart sounds.  Exam reveals no gallop and no friction rub.   No murmur heard. Pulmonary/Chest: Effort normal. No respiratory distress. He has decreased breath sounds. He has no wheezes. He has no rhonchi. He has no rales. He exhibits no tenderness and no crepitus.  Patient is laying fairly flat and does not appear to be short of breath.  Abdominal: Soft. Normal appearance and bowel sounds are normal. He exhibits no distension. There is no tenderness. There is no rebound and no guarding.  Musculoskeletal: Normal range of motion. He exhibits no edema or tenderness.  Moves all extremities well.   Neurological: He is alert and oriented to person, place, and time. He has normal strength. No cranial nerve deficit.  Skin: Skin is warm, dry and intact. No rash noted. No erythema. No pallor.  Psychiatric: He has a normal mood and affect. His speech is normal and behavior is normal. His mood appears not anxious.  Nursing note and vitals reviewed.   ED Course  Procedures (including critical care time)  Medications  methylPREDNISolone sodium succinate (SOLU-MEDROL) 125 mg/2 mL injection 125 mg (125 mg Intravenous Given 06/04/14 0346)  albuterol (PROVENTIL) (2.5 MG/3ML) 0.083% nebulizer solution 5 mg (5 mg Nebulization Given 06/04/14 0350)  ipratropium (ATROVENT) nebulizer solution 0.5 mg (0.5 mg Nebulization Given 06/04/14 0350)  nitroGLYCERIN (NITROGLYN) 2 % ointment 1 inch (1 inch Topical Given 06/04/14 0348)  aspirin chewable tablet 324 mg (324 mg Oral Given 06/04/14 0347)  furosemide (LASIX) injection 80  mg (80 mg Intravenous Given 06/04/14 0536)  potassium chloride SA (K-DUR,KLOR-CON) CR tablet 40 mEq (40 mEq Oral Given 06/04/14 0536)  insulin aspart (novoLOG) injection 5 Units (5 Units Intravenous Given 06/04/14 0536)   albuterol (PROVENTIL) (2.5 MG/3ML) 0.083% nebulizer solution 5 mg (5 mg Nebulization Given 06/04/14 0541)  ipratropium (ATROVENT) nebulizer solution 0.5 mg (0.5 mg Nebulization Given 06/04/14 0541)    Patient had nitroglycerin ointment placed on his chest. He was given aspirin to chew. After reviewing his x-ray he was given albuterol, Solu-Medrol IV, and Lasix IV. His potassium was low and he was given oral potassium. Patient did have a drop of his blood pressure into the mid 90s after the nitroglycerin however it slowly improved without treatment.  Patient's prior records shows she was just discharged from the hospital on February 3 after presenting with similar complaints as today.  05:49 Dr Toniann Fail, admit to tele, Dr Juanetta Gosling attending  Labs Review Results for orders placed or performed during the hospital encounter of 06/04/14  Comprehensive metabolic panel  Result Value Ref Range   Sodium 131 (L) 135 - 145 mmol/L   Potassium 3.3 (L) 3.5 - 5.1 mmol/L   Chloride 103 96 - 112 mmol/L   CO2 28 19 - 32 mmol/L   Glucose, Bld 316 (H) 70 - 99 mg/dL   BUN 15 6 - 23 mg/dL   Creatinine, Ser 1.61 0.50 - 1.35 mg/dL   Calcium 7.5 (L) 8.4 - 10.5 mg/dL   Total Protein 5.9 (L) 6.0 - 8.3 g/dL   Albumin 3.0 (L) 3.5 - 5.2 g/dL   AST 11 0 - 37 U/L   ALT 9 0 - 53 U/L   Alkaline Phosphatase 63 39 - 117 U/L   Total Bilirubin 0.7 0.3 - 1.2 mg/dL   GFR calc non Af Amer 89 (L) >90 mL/min   GFR calc Af Amer >90 >90 mL/min   Anion gap 0 (L) 5 - 15  CBC with Differential  Result Value Ref Range   WBC 8.5 4.0 - 10.5 K/uL   RBC 3.45 (L) 4.22 - 5.81 MIL/uL   Hemoglobin 10.1 (L) 13.0 - 17.0 g/dL   HCT 09.6 (L) 04.5 - 40.9 %   MCV 88.7 78.0 - 100.0 fL   MCH 29.3 26.0 - 34.0 pg   MCHC 33.0 30.0 - 36.0 g/dL   RDW 81.1 91.4 - 78.2 %   Platelets 200 150 - 400 K/uL   Neutrophils Relative % 80 (H) 43 - 77 %   Neutro Abs 6.8 1.7 - 7.7 K/uL   Lymphocytes Relative 10 (L) 12 - 46 %   Lymphs Abs 0.8 0.7 - 4.0  K/uL   Monocytes Relative 8 3 - 12 %   Monocytes Absolute 0.7 0.1 - 1.0 K/uL   Eosinophils Relative 2 0 - 5 %   Eosinophils Absolute 0.2 0.0 - 0.7 K/uL   Basophils Relative 0 0 - 1 %   Basophils Absolute 0.0 0.0 - 0.1 K/uL  Troponin I  Result Value Ref Range   Troponin I 0.08 (H) <0.031 ng/mL  Brain natriuretic peptide  Result Value Ref Range   B Natriuretic Peptide 531.0 (H) 0.0 - 100.0 pg/mL  Digoxin level  Result Value Ref Range   Digoxin Level <0.8 (L) 0.8 - 2.0 ng/mL  CBG monitoring, ED  Result Value Ref Range   Glucose-Capillary 337 (H) 70 - 99 mg/dL    Laboratory interpretation all normal except elevated BNP which is slightly less than  last month, stable anemia, subtherapeutic digoxin level, hyperglycemia, hyponatremia, hypokalemia, malnutrition       Imaging Review Dg Chest Portable 1 View  06/04/2014   CLINICAL DATA:  Shortness of breath and sharp constant chest pain. Symptoms for 1 day.  EXAM: PORTABLE CHEST - 1 VIEW  COMPARISON:  Radiographs and CT 05/07/2014  FINDINGS: Patient is post median sternotomy. Cardiomegaly is slightly decreased. Pulmonary edema has progressed from prior. Blunting of both costophrenic angles, suspect small pleural effusions. No pneumothorax. No confluent airspace disease.  IMPRESSION: Moderate CHF.   Electronically Signed   By: Rubye Oaks M.D.   On: 06/04/2014 04:58    Ct Angio Chest Pe W/cm &/or Wo Cm  05/08/2014   CLINICAL DATA:  Acute onset of shortness of breath and generalized chest pain. Elevated D-dimer. Initial encounter.  IMPRESSION: 1. No evidence of pulmonary embolus. 2. Small bilateral pleural effusions, with diffuse interstitial prominence and hazy bilateral airspace opacification, compatible with pulmonary edema. 3. Mild underlying chronic interstitial change noted, of uncertain significance. If the patient's symptoms persist after completion of treatment for pulmonary edema, would assess further to exclude underlying chronic  interstitial lung disease. 4. Pleural calcification noted; this may reflect prior asbestos exposure. Would correlate clinically. 5. Diffuse coronary artery calcifications seen.   Electronically Signed   By: Roanna Raider M.D.   On: 05/08/2014 19:00     EKG Interpretation   Date/Time:  Saturday June 04 2014 03:12:03 EST Ventricular Rate:  121 PR Interval:  147 QRS Duration: 107 QT Interval:  342 QTC Calculation: 485 R Axis:   32 Text Interpretation:  Sinus tachycardia Atrial premature complexes in  couplets Probable left atrial enlargement Incomplete left bundle branch  block Borderline low voltage, extremity leads Consider anterior infarct No  significant change since last tracing 07 May 2014 Confirmed by [facility]   MD-I, Payson Evrard ([id number]) on 06/04/2014 3:19:24 AM      MDM   Final diagnoses:  Acute on chronic systolic congestive heart failure  Chronic obstructive pulmonary disease, unspecified COPD, unspecified chronic bronchitis type    Plan admission  Devoria Albe, MD, Franz Dell, MD 06/04/14 [phone]

## 2014-06-05 DIAGNOSIS — I5023 Acute on chronic systolic (congestive) heart failure: Secondary | ICD-10-CM | POA: Diagnosis present

## 2014-06-05 LAB — BASIC METABOLIC PANEL
Anion gap: 9 (ref 5–15)
BUN: 33 mg/dL — AB (ref 6–23)
CO2: 32 mmol/L (ref 19–32)
Calcium: 9.7 mg/dL (ref 8.4–10.5)
Chloride: 96 mmol/L (ref 96–112)
Creatinine, Ser: 1.08 mg/dL (ref 0.50–1.35)
GFR calc Af Amer: 81 mL/min — ABNORMAL LOW (ref >90)
GFR calc non Af Amer: 70 mL/min — ABNORMAL LOW (ref >90)
Glucose, Bld: 151 mg/dL — ABNORMAL HIGH (ref 70–99)
POTASSIUM: 4.4 mmol/L (ref 3.5–5.1)
SODIUM: 137 mmol/L (ref 135–145)

## 2014-06-05 LAB — GLUCOSE, CAPILLARY
GLUCOSE-CAPILLARY: 139 mg/dL — AB (ref 70–99)
Glucose-Capillary: 154 mg/dL — ABNORMAL HIGH (ref 70–99)
Glucose-Capillary: 84 mg/dL (ref 70–99)
Glucose-Capillary: 101 mg/dL — ABNORMAL HIGH (ref 70–99)

## 2014-06-05 LAB — CBC
HEMATOCRIT: 36.7 % — AB (ref 39.0–52.0)
Hemoglobin: 12.1 g/dL — ABNORMAL LOW (ref 13.0–17.0)
MCH: 29.4 pg (ref 26.0–34.0)
MCHC: 33 g/dL (ref 30.0–36.0)
MCV: 89.1 fL (ref 78.0–100.0)
Platelets: 258 10*3/uL (ref 150–400)
RBC: 4.12 MIL/uL — ABNORMAL LOW (ref 4.22–5.81)
RDW: 13.9 % (ref 11.5–15.5)
WBC: 20 10*3/uL — ABNORMAL HIGH (ref 4.0–10.5)

## 2014-06-05 LAB — TROPONIN I
TROPONIN I: 0.36 ng/mL — AB (ref <0.031)
Troponin I: 0.34 ng/mL — ABNORMAL HIGH (ref <0.031)
Troponin I: 0.39 ng/mL — ABNORMAL HIGH (ref <0.031)

## 2014-06-05 MED ORDER — CARVEDILOL 12.5 MG PO TABS
12.5000 mg | ORAL_TABLET | Freq: Two times a day (BID) | ORAL | Status: DC
  Administered 2014-06-05 – 2014-06-06 (×2): 12.5 mg via ORAL
  Filled 2014-06-05 (×2): qty 1

## 2014-06-05 MED ORDER — FUROSEMIDE 10 MG/ML IJ SOLN
80.0000 mg | Freq: Three times a day (TID) | INTRAMUSCULAR | Status: DC
  Administered 2014-06-05 – 2014-06-06 (×3): 80 mg via INTRAVENOUS
  Filled 2014-06-05 (×3): qty 8

## 2014-06-05 NOTE — Progress Notes (Signed)
Subjective: He says he doesn't feel very well. He is still short of breath. He has tightness but not chest pain. His troponin level has gone up somewhat and is now 0.2. This has been a problem the last few times he has been in the hospital with elevated troponin levels.  Objective: Vital signs in last 24 hours: Temp:  [97.9 F (36.6 C)-98.1 F (36.7 C)] 98.1 F (36.7 C) (02/28 0607) Pulse Rate:  [102-119] 113 (02/28 0607) Resp:  [20] 20 (02/28 0607) BP: (99-109)/(51-80) 109/80 mmHg (02/28 0607) SpO2:  [90 %-99 %] 90 % (02/28 0733) Weight:  [105.96 kg (233 lb 9.6 oz)] 105.96 kg (233 lb 9.6 oz) (02/28 0852) Weight change: 1.361 kg (3 lb) Last BM Date: 06/03/14  Intake/Output from previous day: 02/27 0701 - 02/28 0700 In: 360 [P.O.:360] Out: 1575 [Urine:1575]  PHYSICAL EXAM General appearance: alert, cooperative, mild distress and morbidly obese Resp: rhonchi bilaterally Cardio: regular rate and rhythm, S1, S2 normal, no murmur, click, rub or gallop GI: soft, non-tender; bowel sounds normal; no masses,  no organomegaly Extremities: extremities normal, atraumatic, no cyanosis or edema  Lab Results:  Results for orders placed or performed during the hospital encounter of 06/04/14 (from the past 48 hour(s))  Comprehensive metabolic panel     Status: Abnormal   Collection Time: 06/04/14  3:25 AM  Result Value Ref Range   Sodium 131 (L) 135 - 145 mmol/L   Potassium 3.3 (L) 3.5 - 5.1 mmol/L   Chloride 103 96 - 112 mmol/L   CO2 28 19 - 32 mmol/L   Glucose, Bld 316 (H) 70 - 99 mg/dL   BUN 15 6 - 23 mg/dL   Creatinine, Ser 0.86 0.50 - 1.35 mg/dL   Calcium 7.5 (L) 8.4 - 10.5 mg/dL   Total Protein 5.9 (L) 6.0 - 8.3 g/dL   Albumin 3.0 (L) 3.5 - 5.2 g/dL   AST 11 0 - 37 U/L   ALT 9 0 - 53 U/L   Alkaline Phosphatase 63 39 - 117 U/L   Total Bilirubin 0.7 0.3 - 1.2 mg/dL   GFR calc non Af Amer 89 (L) >90 mL/min   GFR calc Af Amer >90 >90 mL/min    Comment: (NOTE) The eGFR has been  calculated using the CKD EPI equation. This calculation has not been validated in all clinical situations. eGFR's persistently <90 mL/min signify possible Chronic Kidney Disease.    Anion gap 0 (L) 5 - 15  CBC with Differential     Status: Abnormal   Collection Time: 06/04/14  3:25 AM  Result Value Ref Range   WBC 8.5 4.0 - 10.5 K/uL   RBC 3.45 (L) 4.22 - 5.81 MIL/uL   Hemoglobin 10.1 (L) 13.0 - 17.0 g/dL   HCT 30.6 (L) 39.0 - 52.0 %   MCV 88.7 78.0 - 100.0 fL   MCH 29.3 26.0 - 34.0 pg   MCHC 33.0 30.0 - 36.0 g/dL   RDW 13.9 11.5 - 15.5 %   Platelets 200 150 - 400 K/uL   Neutrophils Relative % 80 (H) 43 - 77 %   Neutro Abs 6.8 1.7 - 7.7 K/uL   Lymphocytes Relative 10 (L) 12 - 46 %   Lymphs Abs 0.8 0.7 - 4.0 K/uL   Monocytes Relative 8 3 - 12 %   Monocytes Absolute 0.7 0.1 - 1.0 K/uL   Eosinophils Relative 2 0 - 5 %   Eosinophils Absolute 0.2 0.0 - 0.7 K/uL   Basophils  Relative 0 0 - 1 %   Basophils Absolute 0.0 0.0 - 0.1 K/uL  Troponin I     Status: Abnormal   Collection Time: 06/04/14  3:25 AM  Result Value Ref Range   Troponin I 0.08 (H) <0.031 ng/mL    Comment:        PERSISTENTLY INCREASED TROPONIN VALUES IN THE RANGE OF 0.04-0.49 ng/mL CAN BE SEEN IN:       -UNSTABLE ANGINA       -CONGESTIVE HEART FAILURE       -MYOCARDITIS       -CHEST TRAUMA       -ARRYHTHMIAS       -LATE PRESENTING MYOCARDIAL INFARCTION       -COPD   CLINICAL FOLLOW-UP RECOMMENDED.   Brain natriuretic peptide     Status: Abnormal   Collection Time: 06/04/14  3:25 AM  Result Value Ref Range   B Natriuretic Peptide 531.0 (H) 0.0 - 100.0 pg/mL  Digoxin level     Status: Abnormal   Collection Time: 06/04/14  3:25 AM  Result Value Ref Range   Digoxin Level <0.8 (L) 0.8 - 2.0 ng/mL  CBG monitoring, ED     Status: Abnormal   Collection Time: 06/04/14  3:32 AM  Result Value Ref Range   Glucose-Capillary 337 (H) 70 - 99 mg/dL  Glucose, capillary     Status: Abnormal   Collection Time: 06/04/14   8:10 AM  Result Value Ref Range   Glucose-Capillary 438 (H) 70 - 99 mg/dL   Comment 1 Notify RN   Basic metabolic panel     Status: Abnormal   Collection Time: 06/04/14  8:48 AM  Result Value Ref Range   Sodium 131 (L) 135 - 145 mmol/L   Potassium 4.2 3.5 - 5.1 mmol/L    Comment: DELTA CHECK NOTED   Chloride 95 (L) 96 - 112 mmol/L    Comment: DELTA CHECK NOTED   CO2 28 19 - 32 mmol/L   Glucose, Bld 486 (H) 70 - 99 mg/dL   BUN 20 6 - 23 mg/dL   Creatinine, Ser 1.20 0.50 - 1.35 mg/dL   Calcium 8.6 8.4 - 10.5 mg/dL   GFR calc non Af Amer 62 (L) >90 mL/min   GFR calc Af Amer 72 (L) >90 mL/min    Comment: (NOTE) The eGFR has been calculated using the CKD EPI equation. This calculation has not been validated in all clinical situations. eGFR's persistently <90 mL/min signify possible Chronic Kidney Disease.    Anion gap 8 5 - 15  CBC with Differential/Platelet     Status: Abnormal   Collection Time: 06/04/14  8:48 AM  Result Value Ref Range   WBC 10.0 4.0 - 10.5 K/uL   RBC 3.94 (L) 4.22 - 5.81 MIL/uL   Hemoglobin 11.6 (L) 13.0 - 17.0 g/dL   HCT 35.1 (L) 39.0 - 52.0 %   MCV 89.1 78.0 - 100.0 fL   MCH 29.4 26.0 - 34.0 pg   MCHC 33.0 30.0 - 36.0 g/dL   RDW 13.8 11.5 - 15.5 %   Platelets 205 150 - 400 K/uL   Neutrophils Relative % 96 (H) 43 - 77 %   Neutro Abs 9.6 (H) 1.7 - 7.7 K/uL   Lymphocytes Relative 3 (L) 12 - 46 %   Lymphs Abs 0.3 (L) 0.7 - 4.0 K/uL   Monocytes Relative 1 (L) 3 - 12 %   Monocytes Absolute 0.1 0.1 - 1.0 K/uL   Eosinophils  Relative 0 0 - 5 %   Eosinophils Absolute 0.0 0.0 - 0.7 K/uL   Basophils Relative 0 0 - 1 %   Basophils Absolute 0.0 0.0 - 0.1 K/uL  TSH     Status: None   Collection Time: 06/04/14  8:48 AM  Result Value Ref Range   TSH 0.504 0.350 - 4.500 uIU/mL  Troponin I (q 6hr x 3)     Status: Abnormal   Collection Time: 06/04/14  8:48 AM  Result Value Ref Range   Troponin I 0.06 (H) <0.031 ng/mL    Comment:        PERSISTENTLY INCREASED  TROPONIN VALUES IN THE RANGE OF 0.04-0.49 ng/mL CAN BE SEEN IN:       -UNSTABLE ANGINA       -CONGESTIVE HEART FAILURE       -MYOCARDITIS       -CHEST TRAUMA       -ARRYHTHMIAS       -LATE PRESENTING MYOCARDIAL INFARCTION       -COPD   CLINICAL FOLLOW-UP RECOMMENDED.   Glucose, capillary     Status: Abnormal   Collection Time: 06/04/14 10:10 AM  Result Value Ref Range   Glucose-Capillary 510 (H) 70 - 99 mg/dL   Comment 1 Notify RN   Glucose, capillary     Status: Abnormal   Collection Time: 06/04/14 11:53 AM  Result Value Ref Range   Glucose-Capillary 490 (H) 70 - 99 mg/dL   Comment 1 Notify RN   Troponin I (q 6hr x 3)     Status: Abnormal   Collection Time: 06/04/14  1:15 PM  Result Value Ref Range   Troponin I 0.07 (H) <0.031 ng/mL    Comment:        PERSISTENTLY INCREASED TROPONIN VALUES IN THE RANGE OF 0.04-0.49 ng/mL CAN BE SEEN IN:       -UNSTABLE ANGINA       -CONGESTIVE HEART FAILURE       -MYOCARDITIS       -CHEST TRAUMA       -ARRYHTHMIAS       -LATE PRESENTING MYOCARDIAL INFARCTION       -COPD   CLINICAL FOLLOW-UP RECOMMENDED.   Glucose, capillary     Status: Abnormal   Collection Time: 06/04/14  2:44 PM  Result Value Ref Range   Glucose-Capillary 377 (H) 70 - 99 mg/dL   Comment 1 Notify RN   Glucose, capillary     Status: Abnormal   Collection Time: 06/04/14  4:49 PM  Result Value Ref Range   Glucose-Capillary 235 (H) 70 - 99 mg/dL   Comment 1 Notify RN   Troponin I (q 6hr x 3)     Status: Abnormal   Collection Time: 06/04/14  7:39 PM  Result Value Ref Range   Troponin I 0.20 (H) <0.031 ng/mL    Comment:        PERSISTENTLY INCREASED TROPONIN VALUES IN THE RANGE OF 0.04-0.49 ng/mL CAN BE SEEN IN:       -UNSTABLE ANGINA       -CONGESTIVE HEART FAILURE       -MYOCARDITIS       -CHEST TRAUMA       -ARRYHTHMIAS       -LATE PRESENTING MYOCARDIAL INFARCTION       -COPD   CLINICAL FOLLOW-UP RECOMMENDED.   Glucose, capillary     Status: Abnormal    Collection Time: 06/04/14  9:41 PM  Result Value Ref Range  Glucose-Capillary 246 (H) 70 - 99 mg/dL  Basic metabolic panel     Status: Abnormal   Collection Time: 06/05/14  6:06 AM  Result Value Ref Range   Sodium 137 135 - 145 mmol/L   Potassium 4.4 3.5 - 5.1 mmol/L   Chloride 96 96 - 112 mmol/L   CO2 32 19 - 32 mmol/L   Glucose, Bld 151 (H) 70 - 99 mg/dL   BUN 33 (H) 6 - 23 mg/dL    Comment: DELTA CHECK NOTED   Creatinine, Ser 1.08 0.50 - 1.35 mg/dL   Calcium 9.7 8.4 - 10.5 mg/dL   GFR calc non Af Amer 70 (L) >90 mL/min   GFR calc Af Amer 81 (L) >90 mL/min    Comment: (NOTE) The eGFR has been calculated using the CKD EPI equation. This calculation has not been validated in all clinical situations. eGFR's persistently <90 mL/min signify possible Chronic Kidney Disease.    Anion gap 9 5 - 15  CBC     Status: Abnormal   Collection Time: 06/05/14  6:06 AM  Result Value Ref Range   WBC 20.0 (H) 4.0 - 10.5 K/uL   RBC 4.12 (L) 4.22 - 5.81 MIL/uL   Hemoglobin 12.1 (L) 13.0 - 17.0 g/dL   HCT 36.7 (L) 39.0 - 52.0 %   MCV 89.1 78.0 - 100.0 fL   MCH 29.4 26.0 - 34.0 pg   MCHC 33.0 30.0 - 36.0 g/dL   RDW 13.9 11.5 - 15.5 %   Platelets 258 150 - 400 K/uL    ABGS No results for input(s): PHART, PO2ART, TCO2, HCO3 in the last 72 hours.  Invalid input(s): PCO2 CULTURES No results found for this or any previous visit (from the past 240 hour(s)). Studies/Results: Dg Chest Portable 1 View  06/04/2014   CLINICAL DATA:  Shortness of breath and sharp constant chest pain. Symptoms for 1 day.  EXAM: PORTABLE CHEST - 1 VIEW  COMPARISON:  Radiographs and CT 05/07/2014  FINDINGS: Patient is post median sternotomy. Cardiomegaly is slightly decreased. Pulmonary edema has progressed from prior. Blunting of both costophrenic angles, suspect small pleural effusions. No pneumothorax. No confluent airspace disease.  IMPRESSION: Moderate CHF.   Electronically Signed   By: Jeb Levering M.D.   On:  06/04/2014 04:58    Medications:  Prior to Admission:  Prescriptions prior to admission  Medication Sig Dispense Refill Last Dose  . aspirin 325 MG tablet Take 325 mg by mouth daily.   Past Week at Unknown time  . Aspirin-Salicylamide-Caffeine (BC FAST PAIN RELIEF ARTHRITIS) 470-962-83 MG PACK Take 1 packet by mouth 3 (three) times daily as needed (for pain).   Past Week at Unknown time  . aspirin-sod bicarb-citric acid (ALKA-SELTZER) 325 MG TBEF tablet Take 650 mg by mouth every 6 (six) hours as needed (pain).   unknown  . diphenhydrAMINE (BENADRYL) 50 MG tablet Take 1 tablet (50 mg total) by mouth every 6 (six) hours as needed for itching. 120 tablet 5 unknown  . furosemide (LASIX) 80 MG tablet Take 80 mg by mouth 2 (two) times daily as needed for fluid.    06/03/2014 at Unknown time  . hydrocortisone 2.5 % cream Apply 1 application topically daily as needed (itching).    unknown  . insulin NPH Human (HUMULIN N,NOVOLIN N) 100 UNIT/ML injection Inject 0.75 mLs (75 Units total) into the skin 2 (two) times daily. (Patient taking differently: Inject 110 Units into the skin 2 (two) times daily. ) 10  mL 11 06/03/2014 at Unknown time  . nitroGLYCERIN (NITROSTAT) 0.4 MG SL tablet Place 1 tablet (0.4 mg total) under the tongue every 5 (five) minutes as needed for chest pain. 25 tablet 3 unknown  . oxyCODONE-acetaminophen (PERCOCET) 10-325 MG per tablet Take 1 tablet by mouth every 4 (four) hours as needed for pain.   unknown  . vitamin B-12 (CYANOCOBALAMIN) 1000 MCG tablet Take 1,000 mcg by mouth daily.   06/03/2014 at Unknown time  . vitamin C (ASCORBIC ACID) 500 MG tablet Take 1,000 mg by mouth at bedtime.   06/03/2014 at Unknown time  . vitamin E 400 UNIT capsule Take 400 Units by mouth at bedtime.    06/03/2014 at Unknown time  . ALPRAZolam (XANAX) 0.25 MG tablet Take 1 tablet (0.25 mg total) by mouth 2 (two) times daily as needed for anxiety. 60 tablet 5   . atorvastatin (LIPITOR) 80 MG tablet Take 1  tablet (80 mg total) by mouth daily. 90 tablet 3 Past Week at Unknown time  . carvedilol (COREG) 6.25 MG tablet Take 1 tablet (6.25 mg total) by mouth 2 (two) times daily. 180 tablet 3 Past Week at Unknown time  . clopidogrel (PLAVIX) 75 MG tablet Take 1 tablet (75 mg total) by mouth daily. 90 tablet 3 Past Week at Unknown time  . digoxin (LANOXIN) 0.25 MG tablet Take 1 tablet (0.25 mg total) by mouth daily. 30 tablet 12 Past Week at Unknown time  . isosorbide mononitrate (IMDUR) 60 MG 24 hr tablet Take 1 tablet (60 mg total) by mouth daily. 90 tablet 3 Past Week at Unknown time  . predniSONE (DELTASONE) 10 MG tablet Take 10-40 mg by mouth daily with breakfast. Take 4 tablets daily for 3 days, 3 tablets daily for 3 days, 2 tablets daily for 3 days, then 1 tablet daily for 3 days.   Past Week at Unknown time  . predniSONE (DELTASONE) 10 MG tablet Take 1 tablet (10 mg total) by mouth daily with breakfast. 30 tablet 1   . sertraline (ZOLOFT) 50 MG tablet Take 1 tablet (50 mg total) by mouth daily. 30 tablet 12    Scheduled: . antiseptic oral rinse  7 mL Mouth Rinse BID  . aspirin  325 mg Oral Daily  . atorvastatin  80 mg Oral q1800  . budesonide (PULMICORT) nebulizer solution  0.25 mg Nebulization BID  . carvedilol  12.5 mg Oral BID  . clopidogrel  75 mg Oral Daily  . digoxin  0.25 mg Oral Daily  . enoxaparin (LOVENOX) injection  40 mg Subcutaneous Q24H  . feeding supplement (ENSURE COMPLETE)  237 mL Oral BID BM  . furosemide  80 mg Intravenous 3 times per day  . insulin aspart  0-20 Units Subcutaneous TID WC  . insulin aspart  0-5 Units Subcutaneous QHS  . insulin NPH Human  50 Units Subcutaneous BID  . ipratropium  0.5 mg Nebulization Q6H  . isosorbide mononitrate  60 mg Oral Daily  . levalbuterol  0.63 mg Nebulization Q6H  . predniSONE  10 mg Oral Q breakfast  . sertraline  50 mg Oral Daily  . sodium chloride  3 mL Intravenous Q12H  . sodium chloride  3 mL Intravenous Q12H  . vitamin  B-12  1,000 mcg Oral Daily  . vitamin E  400 Units Oral QHS   Continuous:  TDS:[id number] **OR** acetaminophen, ALPRAZolam, diphenhydrAMINE, hydrocortisone cream, levalbuterol, nitroGLYCERIN, ondansetron **OR** ondansetron (ZOFRAN) IV, oxyCODONE-acetaminophen **AND** oxyCODONE  Assesment: He is admitted with acute  on chronic respiratory failure and acute on chronic systolic heart failure. His troponin level is up which may be related to his heart failure. EKG does not show any acute changes. He has diuresed about 2 L but his weight is up so there is some discordance between the toes. His blood sugar has been very high but is better now. 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 He continues to have shortness of breath. This is obviously multifactorial. Principal Problem:   Acute on chronic respiratory failure Active Problems:   COPD (chronic obstructive pulmonary disease)   Acute systolic CHF (congestive heart failure)   Diabetes mellitus type 2, uncontrolled    Plan: Increase his Lasix to 80 mg every 8 hours. Continue to cycle troponins. He in the morning. Cardiology consult tomorrow    LOS: 1 day   Custer Pimenta L 06/05/2014, 9:41 AM

## 2014-06-05 NOTE — Progress Notes (Signed)
Patient expressing continued discomfort throughout night including tightness in his chest and short of breath. Patient troponin increased from 0.07 (2/27 at 1315) to 0.20 (2/27 at 1939) last night. Notified Dr. Juanetta GoslingHawkins and ordered 12-lead EKG. MD reviewed EKG and placed orders. Will continue to monitor patient.

## 2014-06-05 NOTE — Progress Notes (Signed)
Patient's troponin level increasing to 0.34 and 0.36 today. VSS and patient denying any increased shortness of breath or pain. Dr. Juanetta GoslingHawkins made aware. Will continue to monitor patient at this time.

## 2014-06-06 DIAGNOSIS — I255 Ischemic cardiomyopathy: Secondary | ICD-10-CM

## 2014-06-06 DIAGNOSIS — I25708 Atherosclerosis of coronary artery bypass graft(s), unspecified, with other forms of angina pectoris: Secondary | ICD-10-CM

## 2014-06-06 DIAGNOSIS — J438 Other emphysema: Secondary | ICD-10-CM

## 2014-06-06 DIAGNOSIS — I5023 Acute on chronic systolic (congestive) heart failure: Principal | ICD-10-CM

## 2014-06-06 LAB — GLUCOSE, CAPILLARY
GLUCOSE-CAPILLARY: 50 mg/dL — AB (ref 70–99)
GLUCOSE-CAPILLARY: 50 mg/dL — AB (ref 70–99)
Glucose-Capillary: 115 mg/dL — ABNORMAL HIGH (ref 70–99)
Glucose-Capillary: 105 mg/dL — ABNORMAL HIGH (ref 70–99)
Glucose-Capillary: 129 mg/dL — ABNORMAL HIGH (ref 70–99)
Glucose-Capillary: 110 mg/dL — ABNORMAL HIGH (ref 70–99)
Glucose-Capillary: 204 mg/dL — ABNORMAL HIGH (ref 70–99)
Glucose-Capillary: 110 mg/dL — ABNORMAL HIGH (ref 70–99)

## 2014-06-06 LAB — BASIC METABOLIC PANEL
Anion gap: 8 (ref 5–15)
BUN: 41 mg/dL — ABNORMAL HIGH (ref 6–23)
CHLORIDE: 95 mmol/L — AB (ref 96–112)
CO2: 30 mmol/L (ref 19–32)
CREATININE: 1.32 mg/dL (ref 0.50–1.35)
Calcium: 8.8 mg/dL (ref 8.4–10.5)
GFR calc non Af Amer: 55 mL/min — ABNORMAL LOW (ref >90)
GFR, EST AFRICAN AMERICAN: 64 mL/min — AB (ref >90)
Glucose, Bld: 135 mg/dL — ABNORMAL HIGH (ref 70–99)
POTASSIUM: 3.4 mmol/L — AB (ref 3.5–5.1)
SODIUM: 133 mmol/L — AB (ref 135–145)

## 2014-06-06 MED ORDER — LEVALBUTEROL HCL 0.63 MG/3ML IN NEBU
0.6300 mg | INHALATION_SOLUTION | Freq: Three times a day (TID) | RESPIRATORY_TRACT | Status: DC
  Administered 2014-06-06 – 2014-06-07 (×2): 0.63 mg via RESPIRATORY_TRACT
  Filled 2014-06-06 (×2): qty 3

## 2014-06-06 MED ORDER — POTASSIUM CHLORIDE CRYS ER 20 MEQ PO TBCR
20.0000 meq | EXTENDED_RELEASE_TABLET | Freq: Two times a day (BID) | ORAL | Status: DC
  Administered 2014-06-06 – 2014-06-09 (×7): 20 meq via ORAL
  Filled 2014-06-06 (×7): qty 1

## 2014-06-06 MED ORDER — INSULIN NPH (HUMAN) (ISOPHANE) 100 UNIT/ML ~~LOC~~ SUSP
40.0000 [IU] | Freq: Every day | SUBCUTANEOUS | Status: DC
  Administered 2014-06-06: 40 [IU] via SUBCUTANEOUS

## 2014-06-06 MED ORDER — POLYETHYLENE GLYCOL 3350 17 G PO PACK
17.0000 g | PACK | Freq: Every day | ORAL | Status: DC
  Administered 2014-06-06 – 2014-06-09 (×4): 17 g via ORAL
  Filled 2014-06-06 (×4): qty 1

## 2014-06-06 MED ORDER — INSULIN NPH (HUMAN) (ISOPHANE) 100 UNIT/ML ~~LOC~~ SUSP: 40.0000 [IU] | Freq: Two times a day (BID) | SUBCUTANEOUS | Status: DC

## 2014-06-06 MED ORDER — IPRATROPIUM BROMIDE 0.02 % IN SOLN
0.5000 mg | Freq: Three times a day (TID) | RESPIRATORY_TRACT | Status: DC
  Administered 2014-06-06 – 2014-06-07 (×3): 0.5 mg via RESPIRATORY_TRACT
  Filled 2014-06-06 (×3): qty 2.5

## 2014-06-06 MED ORDER — CARVEDILOL 3.125 MG PO TABS
6.2500 mg | ORAL_TABLET | Freq: Two times a day (BID) | ORAL | Status: DC
  Administered 2014-06-06 – 2014-06-09 (×6): 6.25 mg via ORAL
  Filled 2014-06-06 (×6): qty 2

## 2014-06-06 MED ORDER — INSULIN ASPART 100 UNIT/ML ~~LOC~~ SOLN
0.0000 [IU] | Freq: Every day | SUBCUTANEOUS | Status: DC
Start: 2014-06-06 — End: 2014-06-09
  Administered 2014-06-07: 4 [IU] via SUBCUTANEOUS

## 2014-06-06 MED ORDER — FUROSEMIDE 10 MG/ML IJ SOLN
80.0000 mg | Freq: Two times a day (BID) | INTRAMUSCULAR | Status: DC
  Administered 2014-06-06 – 2014-06-07 (×2): 80 mg via INTRAVENOUS
  Filled 2014-06-06 (×2): qty 8

## 2014-06-06 MED ORDER — ASPIRIN 81 MG PO CHEW
81.0000 mg | CHEWABLE_TABLET | Freq: Every day | ORAL | Status: DC
Start: 2014-06-07 — End: 2014-06-09
  Administered 2014-06-07 – 2014-06-09 (×3): 81 mg via ORAL
  Filled 2014-06-06 (×3): qty 1

## 2014-06-06 MED ORDER — INSULIN ASPART 100 UNIT/ML ~~LOC~~ SOLN
0.0000 [IU] | Freq: Three times a day (TID) | SUBCUTANEOUS | Status: DC
Start: 2014-06-06 — End: 2014-06-09
  Administered 2014-06-06: 2 [IU] via SUBCUTANEOUS
  Administered 2014-06-06 – 2014-06-07 (×2): 5 [IU] via SUBCUTANEOUS
  Administered 2014-06-07: 8 [IU] via SUBCUTANEOUS
  Administered 2014-06-08: 5 [IU] via SUBCUTANEOUS
  Administered 2014-06-08: 8 [IU] via SUBCUTANEOUS
  Administered 2014-06-08: 5 [IU] via SUBCUTANEOUS
  Administered 2014-06-09: 3 [IU] via SUBCUTANEOUS
  Administered 2014-06-09: 2 [IU] via SUBCUTANEOUS

## 2014-06-06 NOTE — Progress Notes (Signed)
Notified by patient that he had not had a bowel movement since 2/25. MD was notified. MD ordered for the patient to receive miralax once daily. Will continue to monitor patient at this time.

## 2014-06-06 NOTE — Progress Notes (Signed)
Notified by NT that patients BP: 85/54. Rechecked patients BP: 91/53. Patient states he just feels like he can't focus and gets dizzy at times. MD was notified. No new orders were placed at this time. Patients bed is at the lowest level and call bell is within reach at this time. Will continue to monitor patient.

## 2014-06-06 NOTE — Consult Note (Signed)
CARDIOLOGY CONSULT NOTE   Patient ID: HILLERY BHALLA MRN: [id number] DOB/AGE: 06-26-1950 64 y.o.  Admit Date: 06/04/2014 Referring Physician: Kari Baars MD Primary Physician: Fredirick Maudlin, MD Consulting Cardiologist: Ermalene Searing MD Primary Cardiologist: Dina Rich MD Reason for Consultation: CHF/Troponin elevation   Clinical Summary Mr. Nieto is a 65 y.o.male with known hx of CAD, CABG, Systolic dysfunction EF of ICM, 18%, sever COPD, DM, who was admitted after being seen in the ER due to sudden onset of dyspnea, worsened from baseline, and CHF. He denies medical non-compliance, and is vague on dietary compliance. States he likes thighs from [place] but was too sick to his stomach to eat them. He says that he was feeling very weak and easily dyspneic just by standing up.   On arrival to ER, BP 104/75, 121 O2 Sat 96%, Glucose of 337,  Na 131, Potassium 3.3, Creatnine 0.86, Hgb 10.1/Hct 30.6, troponin 0.08, digoxin level <0.80, CXR moderate CHF, EKG LVH, with PAC;s and incomplete LBBB. He was treated with solu-medrol, atrovent inhaler, NTG  ointment, ASA, lasix 80 mg IV potassium, and insulin. He has diuresed 3.8 liters since admission, weight is not indicative of diureses.     Allergies  Allergen Reactions  . Baycol [Cerivastatin] Other (See Comments)    Patient states it "about killed me" kidney shut down. Black stools.  . Stadol [Butorphanol Tartrate] Other (See Comments)    "makes him crazy"    Medications Scheduled Medications: . antiseptic oral rinse  7 mL Mouth Rinse BID  . aspirin  325 mg Oral Daily  . atorvastatin  80 mg Oral q1800  . budesonide (PULMICORT) nebulizer solution  0.25 mg Nebulization BID  . carvedilol  12.5 mg Oral BID  . clopidogrel  75 mg Oral Daily  . digoxin  0.25 mg Oral Daily  . enoxaparin (LOVENOX) injection  40 mg Subcutaneous Q24H  . feeding supplement (ENSURE COMPLETE)  237 mL Oral BID BM  . furosemide  80 mg Intravenous 3  times per day  . insulin aspart  0-15 Units Subcutaneous TID WC  . insulin aspart  0-5 Units Subcutaneous QHS  . insulin NPH Human  40 Units Subcutaneous QAC breakfast   And  . insulin NPH Human  40 Units Subcutaneous QHS  . ipratropium  0.5 mg Nebulization Q6H  . isosorbide mononitrate  60 mg Oral Daily  . levalbuterol  0.63 mg Nebulization Q6H  . potassium chloride  20 mEq Oral BID  . predniSONE  10 mg Oral Q breakfast  . sertraline  50 mg Oral Daily  . sodium chloride  3 mL Intravenous Q12H  . sodium chloride  3 mL Intravenous Q12H  . vitamin B-12  1,000 mcg Oral Daily  . vitamin E  400 Units Oral QHS    Infusions:    PRN Medications: acetaminophen **OR** acetaminophen, ALPRAZolam, diphenhydrAMINE, hydrocortisone cream, levalbuterol, nitroGLYCERIN, ondansetron **OR** ondansetron (ZOFRAN) IV, oxyCODONE-acetaminophen **AND** oxyCODONE   Past Medical History  Diagnosis Date  . Uncontrolled diabetes mellitus     a. A1C 12.8 in 01/2013.  [name] Chronic systolic heart failure   . Obesity   . Hyperlipidemia   . Peripheral vascular disease   . Hypertension   . Asthma   . CAD (coronary artery disease)     a. Prior CABG 20 yrs ago (?~1994). b. Hx of multiple stents, prev followed in [place]. c. [place] 01/2013: cath moderate disease in LAD, distal LCx, prox RCA occluded, 2 SVGs occluded, felt to be  stable from prior cath -> for medical therapy.  . Ischemic cardiomyopathy     a. EF 2012: 55-60. b. EF 12/2011: 25-30%. c. Echo 01/2013: EF 15-20%.  . Stroke     15 years ago  . Arthritis   . Anxiety   . Depression   . 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 Chronic respiratory failure     a. Due to COPD.  . Osteomyelitis 2013    was planned for chronic suppressive antibiotics but cannot afford this any longer  . DNR no code (do not resuscitate) 07/2013  . CHF (congestive heart failure)   . Diabetes mellitus without complication      Past Surgical History  Procedure Laterality Date  . Coronary artery bypass graft    . Coronary stent placement  2008    CABG grafts closed  . Joint replacement    . Cholecystectomy    . Eye surgery    . I&d extremity  06/12/2011    Procedure: IRRIGATION AND DEBRIDEMENT EXTREMITY;  Surgeon: Javier DockerJeffrey C Beane, MD;  Location: [facility] OR;  Service: Orthopedics;  Laterality: Right;  I&D Right [place]  . Left and right heart catheterization with coronary/graft angiogram N/A 01/15/2013    Procedure: LEFT AND RIGHT HEART CATHETERIZATION WITH Isabel CapriceORONARY/GRAFT ANGIOGRAM;  Surgeon: Alvia GroveJr Philip J Nahser, MD;  [facility];  Service: Cardiovascular;  Laterality: N/A;    Family History  Problem Relation Age of Onset  . Dementia Mother   . Arthritis Mother   . Heart disease Father   . Heart disease Brother     Social History Mr. Maisie Fushomas reports that he quit smoking about 31 years ago. He started smoking about 57 years ago. He has never used smokeless tobacco. Mr. Maisie Fushomas reports that he does not drink alcohol.  Review of Systems Complete review of systems are found to be negative unless outlined in H&P above.  Physical Examination Blood pressure 108/69, pulse 109, temperature 98.5 F (36.9 C), temperature source Oral, resp. rate 20, height 6\' 3"  (1.905 m), weight 103.647 kg (228 lb 8 oz), SpO2 91 %.  Intake/Output Summary (Last 24 hours) at 06/06/14 1010 Last data filed at 06/06/14 0854  Gross per 24 hour  Intake    763 ml  Output   3175 ml  Net  -2412 ml    Telemetry:Sinsu tachycardia.   GEN: Weak tired.  HEENT: Conjunctiva and lids normal, oropharynx clear with moist mucosa. Neck: Supple, no elevated JVP or carotid bruits, no thyromegaly. Lungs: Some breath sounds diminished in the bases. Poor inspiratory effort.  Cardiac: Regular rate and rhythm, tachycardic, no S3 or significant systolic murmur, no pericardial rub. Abdomen: Soft, nontender, no hepatomegaly, bowel sounds present, no  guarding or rebound. Extremities: No pitting edema, distal pulses 2+. Skin: Warm and dry.Pale  Musculoskeletal: No kyphosis. Neuropsychiatric: Alert and oriented x3, affect grossly appropriate.  Prior Cardiac Testing/Procedures 1.Cardiac Cath 01/15/2013  Conclusions:  1. CAD: The LM is ~ 60% . The LAD has mild - moderate irregularities. The distal LCx is occluded and the prox RCA is occluded. Both SVGs are occluded 2. Poor LV function by echo. LVG was not done in order to save contrast.  Given his risk factors ( poorly controlled DM, CHF) I think medical management is indicated.  2. Echocardiogram Left ventricle: The cavity size was normal. Wall thickness was normal. Systolic function was severely reduced. The estimated ejection fraction  was in the range of 20% to 25%. - Regional wall motion abnormality: Hypokinesis of the mid anterior, basal-mid anteroseptal, and mid anterolateral myocardium. - Aortic valve: Mildly calcified annulus. Trileaflet; mildly thickened leaflets. Valve area (VTI): 3.78 cm^2. Valve area (Vmax): 3.91 cm^2. - Mitral valve: Mildly calcified annulus. Mildly thickened leaflets  Left atrium: The atrium was severely dilated. - Atrial septum: No defect or patent foramen ovale was identified. - Technically difficult study.  Lab Results  Basic Metabolic Panel:  Recent Labs Lab 06/04/14 0325 06/04/14 0848 06/05/14 0606 06/06/14 0726  NA 131* 131* 137 133*  K 3.3* 4.2 4.4 3.4*  CL 103 95* 96 95*  CO2 28 28 32 30  GLUCOSE 316* 486* 151* 135*  BUN 15 20 33* 41*  CREATININE 0.86 1.20 1.08 1.32  CALCIUM 7.5* 8.6 9.7 8.8    Liver Function Tests:  Recent Labs Lab 06/04/14 0325  AST 11  ALT 9  ALKPHOS 63  BILITOT 0.7  PROT 5.9*  ALBUMIN 3.0*    CBC:  Recent Labs Lab 06/04/14 0325 06/04/14 0848 06/05/14 0606  WBC 8.5 10.0 20.0*  NEUTROABS 6.8 9.6*  --   HGB 10.1* 11.6* 12.1*  HCT 30.6* 35.1* 36.7*  MCV 88.7 89.1 89.1   PLT 200 205 258    Cardiac Enzymes:  Recent Labs Lab 06/04/14 1315 06/04/14 1939 06/05/14 0606 06/05/14 1146 06/05/14 1723  TROPONINI 0.07* 0.20* 0.34* 0.36* 0.39*    BNP: 1014.0   Radiology: Patient is post median sternotomy. Cardiomegaly is slightly decreased. Pulmonary edema has progressed from prior. Blunting of both costophrenic angles, suspect small pleural effusions. No pneumothorax. No confluent airspace disease.  IMPRESSION: Moderate CHF.  ECG: Sinus tachycardia, LVH, with PAC's rate of 132 bpm.    Impression and Recommendations  1. Systolic CHF with ICM EF of 20%: He is not a candidate of ICD and he is a DNR and does not want any invasive treatment. He has diuresed 3.9 liters with IV lasix. He is vague on dietary compliance, He also has hx of orthostatic BP's and severely deconditioned and wheel chair bound. Creatinine is slowly rising with diureses. No evidence of LEE, but states breathing status is no better. On last office visit, weight was 232 lbs, wt today is 228 lbs, although uncertain of accuracy. On lasix 80 IV mg BID. Will transition to po diuretic in am, consider change to torsemide due to better bioavailabbility, but I suspect dietary non-compliance is contributing. He is end-stage. Consider hospice consultation.   2. CAD: Imdur was decreased on last admission. NM study, low LVEF with evidence of inferior wall infarct with very minimal peri-infarct ischemia. Findings consistent with known native RCA and LCX disease, with known occluded grafts to both the RVA and OM. No evidence of any new ischemic territories. Will continue medical therapy with ASA, Statin, Plavix BB. Not on statin  3.  Uncontrolled Diabetes; Blood glucose was elevated on admission. Managed by PTH.  Signed: Bettey Mare. Lawrence NP [facility]  06/06/2014, 10:10 AM Co-Sign MD  The patient was seen and examined, and I agree with the assessment and plan as documented above, with modifications  as noted below. Pt well known to our service with severe CAD with 60% left main disease and two occluded bypass grafts, not amenable to PCI or redo CABG, chronic systolic heart failure (EF 20-25% on 05/07/14) admitted with acute systolic CHF and chest pain with troponin elevation indicative of demand ischemia. He has a long history of dietary noncompliance  as well. His main complaint is dyspnea with minimal exertion. He is tachycardic and currently on Coreg 12.5 mg bid with low normal BP, as well as digoxin with respect to AV nodal blocking agents. He has had nearly 4 liters output and is currently on Lasix 80 mg IV TID. BUN 33-->41. SCr 1.08-->1.32. For CAD, he is taking ASA 325 ng, Plavix, and Imdur 60 mg.  Given CAD and CHF which are end-stage, medical management is indicated. In an effort to reduce his heart rate without significantly reducing his BP, I will decrease the dose of Coreg to 6.25 mg bid and consider the addition of ivabradine on 3/1 at a low dose of 2.5 mg bid. Continue digoxin for now. His tachycardia is certainly contributing to his dyspnea with minimal exertion (unable to walk across room at home without exertional dyspnea which has been progressive for months as per patient). Will reduce ASA to 81 mg given concomitant use of Plavix for CAD. BUN/SCr ratio reflective of adequate diuresis. Will switch IV Lasix to BID dosing and consider switching to torsemide on 3/1. I suspect dietary indiscretion led to his current decompensation.

## 2014-06-06 NOTE — Progress Notes (Signed)
Subjective: He says he still feels bad. He is short of breath. He is not having chest pain. His blood sugar dropped this morning.  Objective: Vital signs in last 24 hours: Temp:  [97.4 F (36.3 C)-98.5 F (36.9 C)] 98.5 F (36.9 C) (02/29 0602) Pulse Rate:  [85-116] 109 (02/29 0838) Resp:  [20] 20 (02/29 0602) BP: (92-116)/(69-73) 108/69 mmHg (02/29 0602) SpO2:  [90 %-100 %] 91 % (02/29 0819) Weight:  [103.647 kg (228 lb 8 oz)-105.96 kg (233 lb 9.6 oz)] 103.647 kg (228 lb 8 oz) (02/29 0602) Weight change: 2.54 kg (5 lb 9.6 oz) Last BM Date: 06/03/14  Intake/Output from previous day: 02/28 0701 - 02/29 0700 In: 1000 [P.O.:1000] Out: 3325 [Urine:3325]  PHYSICAL EXAM General appearance: alert and moderate distress Resp: rhonchi bilaterally Cardio: regular rate and rhythm, S1, S2 normal, no murmur, click, rub or gallop GI: soft, non-tender; bowel sounds normal; no masses,  no organomegaly Extremities: extremities normal, atraumatic, no cyanosis or edema  Lab Results:  Results for orders placed or performed during the hospital encounter of 06/04/14 (from the past 48 hour(s))  Basic metabolic panel     Status: Abnormal   Collection Time: 06/04/14  8:48 AM  Result Value Ref Range   Sodium 131 (L) 135 - 145 mmol/L   Potassium 4.2 3.5 - 5.1 mmol/L    Comment: DELTA CHECK NOTED   Chloride 95 (L) 96 - 112 mmol/L    Comment: DELTA CHECK NOTED   CO2 28 19 - 32 mmol/L   Glucose, Bld 486 (H) 70 - 99 mg/dL   BUN 20 6 - 23 mg/dL   Creatinine, Ser 1.20 0.50 - 1.35 mg/dL   Calcium 8.6 8.4 - 10.5 mg/dL   GFR calc non Af Amer 62 (L) >90 mL/min   GFR calc Af Amer 72 (L) >90 mL/min    Comment: (NOTE) The eGFR has been calculated using the CKD EPI equation. This calculation has not been validated in all clinical situations. eGFR's persistently <90 mL/min signify possible Chronic Kidney Disease.    Anion gap 8 5 - 15  CBC with Differential/Platelet     Status: Abnormal   Collection  Time: 06/04/14  8:48 AM  Result Value Ref Range   WBC 10.0 4.0 - 10.5 K/uL   RBC 3.94 (L) 4.22 - 5.81 MIL/uL   Hemoglobin 11.6 (L) 13.0 - 17.0 g/dL   HCT 35.1 (L) 39.0 - 52.0 %   MCV 89.1 78.0 - 100.0 fL   MCH 29.4 26.0 - 34.0 pg   MCHC 33.0 30.0 - 36.0 g/dL   RDW 13.8 11.5 - 15.5 %   Platelets 205 150 - 400 K/uL   Neutrophils Relative % 96 (H) 43 - 77 %   Neutro Abs 9.6 (H) 1.7 - 7.7 K/uL   Lymphocytes Relative 3 (L) 12 - 46 %   Lymphs Abs 0.3 (L) 0.7 - 4.0 K/uL   Monocytes Relative 1 (L) 3 - 12 %   Monocytes Absolute 0.1 0.1 - 1.0 K/uL   Eosinophils Relative 0 0 - 5 %   Eosinophils Absolute 0.0 0.0 - 0.7 K/uL   Basophils Relative 0 0 - 1 %   Basophils Absolute 0.0 0.0 - 0.1 K/uL  TSH     Status: None   Collection Time: 06/04/14  8:48 AM  Result Value Ref Range   TSH 0.504 0.350 - 4.500 uIU/mL  Troponin I (q 6hr x 3)     Status: Abnormal   Collection  Time: 06/04/14  8:48 AM  Result Value Ref Range   Troponin I 0.06 (H) <0.031 ng/mL    Comment:        PERSISTENTLY INCREASED TROPONIN VALUES IN THE RANGE OF 0.04-0.49 ng/mL CAN BE SEEN IN:       -UNSTABLE ANGINA       -CONGESTIVE HEART FAILURE       -MYOCARDITIS       -CHEST TRAUMA       -ARRYHTHMIAS       -LATE PRESENTING MYOCARDIAL INFARCTION       -COPD   CLINICAL FOLLOW-UP RECOMMENDED.   Glucose, capillary     Status: Abnormal   Collection Time: 06/04/14 10:10 AM  Result Value Ref Range   Glucose-Capillary 510 (H) 70 - 99 mg/dL   Comment 1 Notify RN   Glucose, capillary     Status: Abnormal   Collection Time: 06/04/14 11:53 AM  Result Value Ref Range   Glucose-Capillary 490 (H) 70 - 99 mg/dL   Comment 1 Notify RN   Troponin I (q 6hr x 3)     Status: Abnormal   Collection Time: 06/04/14  1:15 PM  Result Value Ref Range   Troponin I 0.07 (H) <0.031 ng/mL    Comment:        PERSISTENTLY INCREASED TROPONIN VALUES IN THE RANGE OF 0.04-0.49 ng/mL CAN BE SEEN IN:       -UNSTABLE ANGINA       -CONGESTIVE HEART  FAILURE       -MYOCARDITIS       -CHEST TRAUMA       -ARRYHTHMIAS       -LATE PRESENTING MYOCARDIAL INFARCTION       -COPD   CLINICAL FOLLOW-UP RECOMMENDED.   Glucose, capillary     Status: Abnormal   Collection Time: 06/04/14  2:44 PM  Result Value Ref Range   Glucose-Capillary 377 (H) 70 - 99 mg/dL   Comment 1 Notify RN   Glucose, capillary     Status: Abnormal   Collection Time: 06/04/14  4:49 PM  Result Value Ref Range   Glucose-Capillary 235 (H) 70 - 99 mg/dL   Comment 1 Notify RN   Troponin I (q 6hr x 3)     Status: Abnormal   Collection Time: 06/04/14  7:39 PM  Result Value Ref Range   Troponin I 0.20 (H) <0.031 ng/mL    Comment:        PERSISTENTLY INCREASED TROPONIN VALUES IN THE RANGE OF 0.04-0.49 ng/mL CAN BE SEEN IN:       -UNSTABLE ANGINA       -CONGESTIVE HEART FAILURE       -MYOCARDITIS       -CHEST TRAUMA       -ARRYHTHMIAS       -LATE PRESENTING MYOCARDIAL INFARCTION       -COPD   CLINICAL FOLLOW-UP RECOMMENDED.   Glucose, capillary     Status: Abnormal   Collection Time: 06/04/14  9:41 PM  Result Value Ref Range   Glucose-Capillary 246 (H) 70 - 99 mg/dL  Basic metabolic panel     Status: Abnormal   Collection Time: 06/05/14  6:06 AM  Result Value Ref Range   Sodium 137 135 - 145 mmol/L   Potassium 4.4 3.5 - 5.1 mmol/L   Chloride 96 96 - 112 mmol/L   CO2 32 19 - 32 mmol/L   Glucose, Bld 151 (H) 70 - 99 mg/dL   BUN 33 (H) 6 -  23 mg/dL    Comment: DELTA CHECK NOTED   Creatinine, Ser 1.08 0.50 - 1.35 mg/dL   Calcium 9.7 8.4 - 10.5 mg/dL   GFR calc non Af Amer 70 (L) >90 mL/min   GFR calc Af Amer 81 (L) >90 mL/min    Comment: (NOTE) The eGFR has been calculated using the CKD EPI equation. This calculation has not been validated in all clinical situations. eGFR's persistently <90 mL/min signify possible Chronic Kidney Disease.    Anion gap 9 5 - 15  CBC     Status: Abnormal   Collection Time: 06/05/14  6:06 AM  Result Value Ref Range   WBC  20.0 (H) 4.0 - 10.5 K/uL   RBC 4.12 (L) 4.22 - 5.81 MIL/uL   Hemoglobin 12.1 (L) 13.0 - 17.0 g/dL   HCT 36.7 (L) 39.0 - 52.0 %   MCV 89.1 78.0 - 100.0 fL   MCH 29.4 26.0 - 34.0 pg   MCHC 33.0 30.0 - 36.0 g/dL   RDW 13.9 11.5 - 15.5 %   Platelets 258 150 - 400 K/uL  Troponin I (q 6hr x 3)     Status: Abnormal   Collection Time: 06/05/14  6:06 AM  Result Value Ref Range   Troponin I 0.34 (H) <0.031 ng/mL    Comment:        PERSISTENTLY INCREASED TROPONIN VALUES IN THE RANGE OF 0.04-0.49 ng/mL CAN BE SEEN IN:       -UNSTABLE ANGINA       -CONGESTIVE HEART FAILURE       -MYOCARDITIS       -CHEST TRAUMA       -ARRYHTHMIAS       -LATE PRESENTING MYOCARDIAL INFARCTION       -COPD   CLINICAL FOLLOW-UP RECOMMENDED.   Glucose, capillary     Status: Abnormal   Collection Time: 06/05/14  7:53 AM  Result Value Ref Range   Glucose-Capillary 139 (H) 70 - 99 mg/dL   Comment 1 Notify RN   Troponin I (q 6hr x 3)     Status: Abnormal   Collection Time: 06/05/14 11:46 AM  Result Value Ref Range   Troponin I 0.36 (H) <0.031 ng/mL    Comment:        PERSISTENTLY INCREASED TROPONIN VALUES IN THE RANGE OF 0.04-0.49 ng/mL CAN BE SEEN IN:       -UNSTABLE ANGINA       -CONGESTIVE HEART FAILURE       -MYOCARDITIS       -CHEST TRAUMA       -ARRYHTHMIAS       -LATE PRESENTING MYOCARDIAL INFARCTION       -COPD   CLINICAL FOLLOW-UP RECOMMENDED.   Glucose, capillary     Status: Abnormal   Collection Time: 06/05/14 11:47 AM  Result Value Ref Range   Glucose-Capillary 154 (H) 70 - 99 mg/dL   Comment 1 Notify RN   Glucose, capillary     Status: None   Collection Time: 06/05/14  5:07 PM  Result Value Ref Range   Glucose-Capillary 84 70 - 99 mg/dL   Comment 1 Notify RN   Troponin I (q 6hr x 3)     Status: Abnormal   Collection Time: 06/05/14  5:23 PM  Result Value Ref Range   Troponin I 0.39 (H) <0.031 ng/mL    Comment:        PERSISTENTLY INCREASED TROPONIN VALUES IN THE RANGE OF  0.04-0.49 ng/mL CAN BE SEEN  IN:       -UNSTABLE ANGINA       -CONGESTIVE HEART FAILURE       -MYOCARDITIS       -CHEST TRAUMA       -ARRYHTHMIAS       -LATE PRESENTING MYOCARDIAL INFARCTION       -COPD   CLINICAL FOLLOW-UP RECOMMENDED.   Glucose, capillary     Status: Abnormal   Collection Time: 06/05/14  9:18 PM  Result Value Ref Range   Glucose-Capillary 101 (H) 70 - 99 mg/dL  Glucose, capillary     Status: Abnormal   Collection Time: 06/06/14 12:43 AM  Result Value Ref Range   Glucose-Capillary 50 (L) 70 - 99 mg/dL  Glucose, capillary     Status: Abnormal   Collection Time: 06/06/14  1:16 AM  Result Value Ref Range   Glucose-Capillary 115 (H) 70 - 99 mg/dL  Glucose, capillary     Status: Abnormal   Collection Time: 06/06/14  6:35 AM  Result Value Ref Range   Glucose-Capillary 50 (L) 70 - 99 mg/dL   Comment 1 Notify RN   Glucose, capillary     Status: Abnormal   Collection Time: 06/06/14  6:59 AM  Result Value Ref Range   Glucose-Capillary 105 (H) 70 - 99 mg/dL   Comment 1 Notify RN   Basic metabolic panel     Status: Abnormal   Collection Time: 06/06/14  7:26 AM  Result Value Ref Range   Sodium 133 (L) 135 - 145 mmol/L   Potassium 3.4 (L) 3.5 - 5.1 mmol/L    Comment: DELTA CHECK NOTED   Chloride 95 (L) 96 - 112 mmol/L   CO2 30 19 - 32 mmol/L   Glucose, Bld 135 (H) 70 - 99 mg/dL   BUN 41 (H) 6 - 23 mg/dL   Creatinine, Ser 1.32 0.50 - 1.35 mg/dL   Calcium 8.8 8.4 - 10.5 mg/dL   GFR calc non Af Amer 55 (L) >90 mL/min   GFR calc Af Amer 64 (L) >90 mL/min    Comment: (NOTE) The eGFR has been calculated using the CKD EPI equation. This calculation has not been validated in all clinical situations. eGFR's persistently <90 mL/min signify possible Chronic Kidney Disease.    Anion gap 8 5 - 15  Glucose, capillary     Status: Abnormal   Collection Time: 06/06/14  7:32 AM  Result Value Ref Range   Glucose-Capillary 129 (H) 70 - 99 mg/dL    ABGS No results for  input(s): PHART, PO2ART, TCO2, HCO3 in the last 72 hours.  Invalid input(s): PCO2 CULTURES No results found for this or any previous visit (from the past 240 hour(s)). Studies/Results: No results found.  Medications:  Prior to Admission:  Prescriptions prior to admission  Medication Sig Dispense Refill Last Dose  . ALPRAZolam (XANAX) 0.25 MG tablet Take 1 tablet (0.25 mg total) by mouth 2 (two) times daily as needed for anxiety. 60 tablet 5 Past Week at Unknown time  . aspirin 325 MG tablet Take 325 mg by mouth daily.   Past Week at Unknown time  . Aspirin-Salicylamide-Caffeine (BC FAST PAIN RELIEF ARTHRITIS) 341-937-90 MG PACK Take 1 packet by mouth 3 (three) times daily as needed (for pain).   Past Week at Unknown time  . aspirin-sod bicarb-citric acid (ALKA-SELTZER) 325 MG TBEF tablet Take 650 mg by mouth every 6 (six) hours as needed (pain).   unknown  . carvedilol (COREG) 6.25 MG tablet Take  1 tablet (6.25 mg total) by mouth 2 (two) times daily. 180 tablet 3 06/04/2014 at 1700  . clopidogrel (PLAVIX) 75 MG tablet Take 1 tablet (75 mg total) by mouth daily. 90 tablet 3 06/03/2014 at Unknown time  . diphenhydrAMINE (BENADRYL) 50 MG tablet Take 1 tablet (50 mg total) by mouth every 6 (six) hours as needed for itching. 120 tablet 5 unknown  . furosemide (LASIX) 80 MG tablet Take 80 mg by mouth 2 (two) times daily as needed for fluid.    06/03/2014 at Unknown time  . hydrocortisone 2.5 % cream Apply 1 application topically daily as needed (itching).    unknown  . insulin NPH Human (HUMULIN N,NOVOLIN N) 100 UNIT/ML injection Inject 0.75 mLs (75 Units total) into the skin 2 (two) times daily. (Patient taking differently: Inject 110 Units into the skin 2 (two) times daily. ) 10 mL 11 06/03/2014 at Unknown time  . isosorbide mononitrate (IMDUR) 60 MG 24 hr tablet Take 1 tablet (60 mg total) by mouth daily. 90 tablet 3 06/03/2014 at Unknown time  . nitroGLYCERIN (NITROSTAT) 0.4 MG SL tablet Place 1  tablet (0.4 mg total) under the tongue every 5 (five) minutes as needed for chest pain. 25 tablet 3 unknown  . oxyCODONE-acetaminophen (PERCOCET) 10-325 MG per tablet Take 1 tablet by mouth every 4 (four) hours as needed for pain.   unknown  . sertraline (ZOLOFT) 50 MG tablet Take 1 tablet (50 mg total) by mouth daily. 30 tablet 12 06/03/2014 at Unknown time  . vitamin B-12 (CYANOCOBALAMIN) 1000 MCG tablet Take 1,000 mcg by mouth daily.   06/03/2014 at Unknown time  . vitamin C (ASCORBIC ACID) 500 MG tablet Take 1,000 mg by mouth at bedtime.   06/03/2014 at Unknown time  . vitamin E 400 UNIT capsule Take 400 Units by mouth at bedtime.    06/03/2014 at Unknown time   Scheduled: . antiseptic oral rinse  7 mL Mouth Rinse BID  . aspirin  325 mg Oral Daily  . atorvastatin  80 mg Oral q1800  . budesonide (PULMICORT) nebulizer solution  0.25 mg Nebulization BID  . carvedilol  12.5 mg Oral BID  . clopidogrel  75 mg Oral Daily  . digoxin  0.25 mg Oral Daily  . enoxaparin (LOVENOX) injection  40 mg Subcutaneous Q24H  . feeding supplement (ENSURE COMPLETE)  237 mL Oral BID BM  . furosemide  80 mg Intravenous 3 times per day  . insulin aspart  0-20 Units Subcutaneous TID WC  . insulin aspart  0-5 Units Subcutaneous QHS  . insulin NPH Human  40 Units Subcutaneous QAC breakfast   And  . insulin NPH Human  40 Units Subcutaneous QHS  . ipratropium  0.5 mg Nebulization Q6H  . isosorbide mononitrate  60 mg Oral Daily  . levalbuterol  0.63 mg Nebulization Q6H  . potassium chloride  20 mEq Oral BID  . predniSONE  10 mg Oral Q breakfast  . sertraline  50 mg Oral Daily  . sodium chloride  3 mL Intravenous Q12H  . sodium chloride  3 mL Intravenous Q12H  . vitamin B-12  1,000 mcg Oral Daily  . vitamin E  400 Units Oral QHS   Continuous:  [id number] **OR** acetaminophen, ALPRAZolam, diphenhydrAMINE, hydrocortisone cream, levalbuterol, nitroGLYCERIN, ondansetron **OR** ondansetron (ZOFRAN) IV,  oxyCODONE-acetaminophen **AND** oxyCODONE  Assesment: He was admitted with acute on chronic systolic heart failure. He is known to have severe coronary artery occlusive disease and he tells me that  he was told there is nothing else that can be done about that except for medical treatment. His troponin has been up. Cardiology consultation will be underway today. His blood sugar had been very high on admission but has improved and I'm going to need to reduce his sliding scale. He has ischemic cardiomyopathy and is being treated with Lasix. He also has COPD but that seems to be less of a problem at this time. Principal Problem:   Acute on chronic systolic heart failure Active Problems:   Silicosis   Ischemic cardiomyopathy   COPD (chronic obstructive pulmonary disease)   Acute on chronic respiratory failure   Acute systolic CHF (congestive heart failure)   Diabetes mellitus type 2, uncontrolled    Plan: Cardiology consultation. Replace potassium. Reduce his sliding scale and I reduced his NPH insulin.    LOS: 2 days   Nahom Carfagno L 06/06/2014, 8:41 AM

## 2014-06-06 NOTE — Progress Notes (Signed)
Pt felt as if blood sugar had dropped. Blood sugar 50. Snack given Po. Blood sugar up to 105.

## 2014-06-06 NOTE — Progress Notes (Signed)
Pt felt that his blood sugar had dropped. Blood sugar 50.Pt drank juice and ate peanut butter and crackers. Blood sugar up to 115.

## 2014-06-06 NOTE — Progress Notes (Signed)
UR chart review completed.  

## 2014-06-06 NOTE — Care Management Note (Addendum)
    Page 1 of 1   06/09/2014     11:10:44 AM CARE MANAGEMENT NOTE 06/09/2014  Patient:  Timothy Trujillo,Timothy Trujillo   Account Number:  [id number]  Date Initiated:  06/06/2014  Documentation initiated by:  Sharrie RothmanBLACKWELL,Manie Bealer C  Subjective/Objective Assessment:   Pt admitted from home with CHF. Pt lives alone and has 2 sons, their wives, and a grandson who are available to assist as needed. Pt stated that he does not think he should ask for help when he needs it. Discussed readmission and barriers     Action/Plan:   to prevent readmission. Pt does not want any HH services but [facility] Pt is [facility]. Pt has home O2, cane, walker, and electric w/c. Pt did ask for list of ALFs in the area and this will be provided.   Anticipated DC Date:  06/10/2014   Anticipated DC Plan:  HOME/SELF CARE      DC Planning Services  CM consult      Choice offered to / List presented to:             Status of service:  Completed, signed off Medicare Important Message given?  YES (If response is "NO", the following Medicare IM given date fields will be blank) Date Medicare IM given:  06/09/2014 Medicare IM given by:  Sharrie RothmanBLACKWELL,Macauley Mossberg C Date Additional Medicare IM given:   Additional Medicare IM given by:    Discharge Disposition:  SKILLED NURSING FACILITY  Per UR Regulation:    If discussed at Long Length of Stay Meetings, dates discussed:    Comments:  06/09/14 1110 Arlyss Queenammy  Katrinka Herbison, RN BSN CM [facility] [place]. CSW to arrange discharge to facility. 06/06/14 1520 Arlyss Queenammy Vera Wishart, RN BSN CM

## 2014-06-07 DIAGNOSIS — I519 Heart disease, unspecified: Secondary | ICD-10-CM

## 2014-06-07 LAB — BASIC METABOLIC PANEL
Anion gap: 13 (ref 5–15)
BUN: 44 mg/dL — ABNORMAL HIGH (ref 6–23)
CALCIUM: 9.7 mg/dL (ref 8.4–10.5)
CO2: 34 mmol/L — ABNORMAL HIGH (ref 19–32)
Chloride: 93 mmol/L — ABNORMAL LOW (ref 96–112)
Creatinine, Ser: 1.17 mg/dL (ref 0.50–1.35)
GFR calc Af Amer: 74 mL/min — ABNORMAL LOW (ref >90)
GFR, EST NON AFRICAN AMERICAN: 64 mL/min — AB (ref >90)
GLUCOSE: 78 mg/dL (ref 70–99)
Potassium: 4 mmol/L (ref 3.5–5.1)
Sodium: 140 mmol/L (ref 135–145)

## 2014-06-07 LAB — GLUCOSE, CAPILLARY
GLUCOSE-CAPILLARY: 210 mg/dL — AB (ref 70–99)
GLUCOSE-CAPILLARY: 297 mg/dL — AB (ref 70–99)
Glucose-Capillary: 83 mg/dL (ref 70–99)
Glucose-Capillary: 78 mg/dL (ref 70–99)

## 2014-06-07 MED ORDER — TORSEMIDE 20 MG PO TABS
20.0000 mg | ORAL_TABLET | Freq: Two times a day (BID) | ORAL | Status: DC
  Administered 2014-06-07 – 2014-06-09 (×4): 20 mg via ORAL
  Filled 2014-06-07 (×4): qty 1

## 2014-06-07 MED ORDER — INSULIN NPH (HUMAN) (ISOPHANE) 100 UNIT/ML ~~LOC~~ SUSP
40.0000 [IU] | Freq: Every day | SUBCUTANEOUS | Status: DC
  Administered 2014-06-08 – 2014-06-09 (×2): 40 [IU] via SUBCUTANEOUS

## 2014-06-07 MED ORDER — IPRATROPIUM BROMIDE 0.02 % IN SOLN
0.5000 mg | Freq: Two times a day (BID) | RESPIRATORY_TRACT | Status: DC
  Administered 2014-06-07 – 2014-06-09 (×4): 0.5 mg via RESPIRATORY_TRACT
  Filled 2014-06-07 (×4): qty 2.5

## 2014-06-07 MED ORDER — LEVALBUTEROL HCL 0.63 MG/3ML IN NEBU
0.6300 mg | INHALATION_SOLUTION | Freq: Two times a day (BID) | RESPIRATORY_TRACT | Status: DC
  Administered 2014-06-07 – 2014-06-09 (×4): 0.63 mg via RESPIRATORY_TRACT
  Filled 2014-06-07 (×4): qty 3

## 2014-06-07 MED ORDER — INSULIN NPH (HUMAN) (ISOPHANE) 100 UNIT/ML ~~LOC~~ SUSP
20.0000 [IU] | Freq: Every day | SUBCUTANEOUS | Status: DC
  Administered 2014-06-07 – 2014-06-08 (×2): 20 [IU] via SUBCUTANEOUS

## 2014-06-07 MED ORDER — DIGOXIN 0.25 MG/ML IJ SOLN
0.2500 mg | Freq: Once | INTRAMUSCULAR | Status: AC
  Administered 2014-06-07: 0.25 mg via INTRAVENOUS
  Filled 2014-06-07: qty 2

## 2014-06-07 NOTE — Progress Notes (Signed)
Benadryl 50 mg PO given per orders.

## 2014-06-07 NOTE — Progress Notes (Signed)
Pharmacist Heart Failure Core Measure Documentation  Assessment: Timothy Trujillo has an EF documented as 20-25% on 05/07/14 by 2D ECHO.  Rationale: Heart failure patients with left ventricular systolic dysfunction (LVSD) and an EF < 40% should be prescribed an angiotensin converting enzyme inhibitor (ACEI) or angiotensin receptor blocker (ARB) at discharge unless a contraindication is documented in the medical record.  This patient is not currently on an ACEI or ARB for HF.  This note is being placed in the record in order to provide documentation that a contraindication to the use of these agents is present for this encounter.  ACE Inhibitor or Angiotensin Receptor Blocker is contraindicated (specify all that apply)  []   ACEI allergy AND ARB allergy []   Angioedema []   Moderate or severe aortic stenosis []   Hyperkalemia [x]   Hypotension []   Renal artery stenosis []   Worsening renal function, preexisting renal disease or dysfunction   Elson ClanLilliston, Chrishawna Farina Michelle 06/07/2014 11:06 AM

## 2014-06-07 NOTE — Progress Notes (Signed)
Patient reported itching on sides of abdomen from exposure to glass insulation several weeks ago.

## 2014-06-07 NOTE — Progress Notes (Signed)
Subjective: He is still short of breath. He is still very weak. He has no new complaints.  Objective: Vital signs in last 24 hours: Temp:  [97.7 F (36.5 C)-99 F (37.2 C)] 99 F (37.2 C) (03/01 0606) Pulse Rate:  [94-106] 100 (03/01 0606) Resp:  [18-20] 18 (03/01 0606) BP: (85-116)/(51-73) 92/73 mmHg (03/01 0606) SpO2:  [91 %-97 %] 91 % (03/01 0606) Weight:  [103.375 kg (227 lb 14.4 oz)] 103.375 kg (227 lb 14.4 oz) (03/01 0606) Weight change: -2.586 kg (-5 lb 11.2 oz) Last BM Date: 06/03/14  Intake/Output from previous day: 02/29 0701 - 03/01 0700 In: 1423 [P.O.:1420; I.V.:3] Out: 3775 [Urine:3775]  PHYSICAL EXAM General appearance: alert, cooperative and mild distress Resp: rhonchi bilaterally Cardio: regular rate and rhythm, S1, S2 normal, no murmur, click, rub or gallop GI: soft, non-tender; bowel sounds normal; no masses,  no organomegaly Extremities: extremities normal, atraumatic, no cyanosis or edema  Lab Results:  Results for orders placed or performed during the hospital encounter of 06/04/14 (from the past 48 hour(s))  Troponin I (q 6hr x 3)     Status: Abnormal   Collection Time: 06/05/14 11:46 AM  Result Value Ref Range   Troponin I 0.36 (H) <0.031 ng/mL    Comment:        PERSISTENTLY INCREASED TROPONIN VALUES IN THE RANGE OF 0.04-0.49 ng/mL CAN BE SEEN IN:       -UNSTABLE ANGINA       -CONGESTIVE HEART FAILURE       -MYOCARDITIS       -CHEST TRAUMA       -ARRYHTHMIAS       -LATE PRESENTING MYOCARDIAL INFARCTION       -COPD   CLINICAL FOLLOW-UP RECOMMENDED.   Glucose, capillary     Status: Abnormal   Collection Time: 06/05/14 11:47 AM  Result Value Ref Range   Glucose-Capillary 154 (H) 70 - 99 mg/dL   Comment 1 Notify RN   Glucose, capillary     Status: None   Collection Time: 06/05/14  5:07 PM  Result Value Ref Range   Glucose-Capillary 84 70 - 99 mg/dL   Comment 1 Notify RN   Troponin I (q 6hr x 3)     Status: Abnormal   Collection Time:  06/05/14  5:23 PM  Result Value Ref Range   Troponin I 0.39 (H) <0.031 ng/mL    Comment:        PERSISTENTLY INCREASED TROPONIN VALUES IN THE RANGE OF 0.04-0.49 ng/mL CAN BE SEEN IN:       -UNSTABLE ANGINA       -CONGESTIVE HEART FAILURE       -MYOCARDITIS       -CHEST TRAUMA       -ARRYHTHMIAS       -LATE PRESENTING MYOCARDIAL INFARCTION       -COPD   CLINICAL FOLLOW-UP RECOMMENDED.   Glucose, capillary     Status: Abnormal   Collection Time: 06/05/14  9:18 PM  Result Value Ref Range   Glucose-Capillary 101 (H) 70 - 99 mg/dL  Glucose, capillary     Status: Abnormal   Collection Time: 06/06/14 12:43 AM  Result Value Ref Range   Glucose-Capillary 50 (L) 70 - 99 mg/dL  Glucose, capillary     Status: Abnormal   Collection Time: 06/06/14  1:16 AM  Result Value Ref Range   Glucose-Capillary 115 (H) 70 - 99 mg/dL  Glucose, capillary     Status: Abnormal   Collection  Time: 06/06/14  6:35 AM  Result Value Ref Range   Glucose-Capillary 50 (L) 70 - 99 mg/dL   Comment 1 Notify RN   Glucose, capillary     Status: Abnormal   Collection Time: 06/06/14  6:59 AM  Result Value Ref Range   Glucose-Capillary 105 (H) 70 - 99 mg/dL   Comment 1 Notify RN   Basic metabolic panel     Status: Abnormal   Collection Time: 06/06/14  7:26 AM  Result Value Ref Range   Sodium 133 (L) 135 - 145 mmol/L   Potassium 3.4 (L) 3.5 - 5.1 mmol/L    Comment: DELTA CHECK NOTED   Chloride 95 (L) 96 - 112 mmol/L   CO2 30 19 - 32 mmol/L   Glucose, Bld 135 (H) 70 - 99 mg/dL   BUN 41 (H) 6 - 23 mg/dL   Creatinine, Ser 1.32 0.50 - 1.35 mg/dL   Calcium 8.8 8.4 - 10.5 mg/dL   GFR calc non Af Amer 55 (L) >90 mL/min   GFR calc Af Amer 64 (L) >90 mL/min    Comment: (NOTE) The eGFR has been calculated using the CKD EPI equation. This calculation has not been validated in all clinical situations. eGFR's persistently <90 mL/min signify possible Chronic Kidney Disease.    Anion gap 8 5 - 15  Glucose, capillary      Status: Abnormal   Collection Time: 06/06/14  7:32 AM  Result Value Ref Range   Glucose-Capillary 129 (H) 70 - 99 mg/dL  Glucose, capillary     Status: Abnormal   Collection Time: 06/06/14 11:27 AM  Result Value Ref Range   Glucose-Capillary 110 (H) 70 - 99 mg/dL  Glucose, capillary     Status: Abnormal   Collection Time: 06/06/14  4:49 PM  Result Value Ref Range   Glucose-Capillary 204 (H) 70 - 99 mg/dL  Glucose, capillary     Status: Abnormal   Collection Time: 06/06/14  8:45 PM  Result Value Ref Range   Glucose-Capillary 110 (H) 70 - 99 mg/dL  Glucose, capillary     Status: None   Collection Time: 06/07/14  1:43 AM  Result Value Ref Range   Glucose-Capillary 83 70 - 99 mg/dL  Basic metabolic panel     Status: Abnormal   Collection Time: 06/07/14  6:25 AM  Result Value Ref Range   Sodium 140 135 - 145 mmol/L    Comment: DELTA CHECK NOTED   Potassium 4.0 3.5 - 5.1 mmol/L   Chloride 93 (L) 96 - 112 mmol/L   CO2 34 (H) 19 - 32 mmol/L   Glucose, Bld 78 70 - 99 mg/dL   BUN 44 (H) 6 - 23 mg/dL   Creatinine, Ser 1.17 0.50 - 1.35 mg/dL   Calcium 9.7 8.4 - 10.5 mg/dL   GFR calc non Af Amer 64 (L) >90 mL/min   GFR calc Af Amer 74 (L) >90 mL/min    Comment: (NOTE) The eGFR has been calculated using the CKD EPI equation. This calculation has not been validated in all clinical situations. eGFR's persistently <90 mL/min signify possible Chronic Kidney Disease.    Anion gap 13 5 - 15    ABGS No results for input(s): PHART, PO2ART, TCO2, HCO3 in the last 72 hours.  Invalid input(s): PCO2 CULTURES No results found for this or any previous visit (from the past 240 hour(s)). Studies/Results: No results found.  Medications:  Prior to Admission:  Prescriptions prior to admission  Medication Sig  Dispense Refill Last Dose  . ALPRAZolam (XANAX) 0.25 MG tablet Take 1 tablet (0.25 mg total) by mouth 2 (two) times daily as needed for anxiety. 60 tablet 5 Past Week at Unknown time  .  aspirin 325 MG tablet Take 325 mg by mouth daily.   Past Week at Unknown time  . Aspirin-Salicylamide-Caffeine (BC FAST PAIN RELIEF ARTHRITIS) [id number] MG PACK Take 1 packet by mouth 3 (three) times daily as needed (for pain).   Past Week at Unknown time  . aspirin-sod bicarb-citric acid (ALKA-SELTZER) 325 MG TBEF tablet Take 650 mg by mouth every 6 (six) hours as needed (pain).   unknown  . carvedilol (COREG) 6.25 MG tablet Take 1 tablet (6.25 mg total) by mouth 2 (two) times daily. 180 tablet 3 06/04/2014 at 1700  . clopidogrel (PLAVIX) 75 MG tablet Take 1 tablet (75 mg total) by mouth daily. 90 tablet 3 06/03/2014 at Unknown time  . diphenhydrAMINE (BENADRYL) 50 MG tablet Take 1 tablet (50 mg total) by mouth every 6 (six) hours as needed for itching. 120 tablet 5 unknown  . furosemide (LASIX) 80 MG tablet Take 80 mg by mouth 2 (two) times daily as needed for fluid.    06/03/2014 at Unknown time  . hydrocortisone 2.5 % cream Apply 1 application topically daily as needed (itching).    unknown  . insulin NPH Human (HUMULIN N,NOVOLIN N) 100 UNIT/ML injection Inject 0.75 mLs (75 Units total) into the skin 2 (two) times daily. (Patient taking differently: Inject 110 Units into the skin 2 (two) times daily. ) 10 mL 11 06/03/2014 at Unknown time  . isosorbide mononitrate (IMDUR) 60 MG 24 hr tablet Take 1 tablet (60 mg total) by mouth daily. 90 tablet 3 06/03/2014 at Unknown time  . nitroGLYCERIN (NITROSTAT) 0.4 MG SL tablet Place 1 tablet (0.4 mg total) under the tongue every 5 (five) minutes as needed for chest pain. 25 tablet 3 unknown  . oxyCODONE-acetaminophen (PERCOCET) 10-325 MG per tablet Take 1 tablet by mouth every 4 (four) hours as needed for pain.   unknown  . sertraline (ZOLOFT) 50 MG tablet Take 1 tablet (50 mg total) by mouth daily. 30 tablet 12 06/03/2014 at Unknown time  . vitamin B-12 (CYANOCOBALAMIN) 1000 MCG tablet Take 1,000 mcg by mouth daily.   06/03/2014 at Unknown time  . vitamin C  (ASCORBIC ACID) 500 MG tablet Take 1,000 mg by mouth at bedtime.   06/03/2014 at Unknown time  . vitamin E 400 UNIT capsule Take 400 Units by mouth at bedtime.    06/03/2014 at Unknown time   Scheduled: . antiseptic oral rinse  7 mL Mouth Rinse BID  . aspirin  81 mg Oral Daily  . atorvastatin  80 mg Oral q1800  . budesonide (PULMICORT) nebulizer solution  0.25 mg Nebulization BID  . carvedilol  6.25 mg Oral BID  . clopidogrel  75 mg Oral Daily  . digoxin  0.25 mg Intravenous Once  . digoxin  0.25 mg Oral Daily  . enoxaparin (LOVENOX) injection  40 mg Subcutaneous Q24H  . feeding supplement (ENSURE COMPLETE)  237 mL Oral BID BM  . furosemide  80 mg Intravenous Q12H  . insulin aspart  0-15 Units Subcutaneous TID WC  . insulin aspart  0-5 Units Subcutaneous QHS  . insulin NPH Human  20 Units Subcutaneous QHS   And  . [START ON 06/08/2014] insulin NPH Human  40 Units Subcutaneous QAC breakfast  . ipratropium  0.5 mg Nebulization TID  .  isosorbide mononitrate  60 mg Oral Daily  . levalbuterol  0.63 mg Nebulization TID  . polyethylene glycol  17 g Oral Daily  . potassium chloride  20 mEq Oral BID  . predniSONE  10 mg Oral Q breakfast  . sertraline  50 mg Oral Daily  . sodium chloride  3 mL Intravenous Q12H  . sodium chloride  3 mL Intravenous Q12H  . vitamin B-12  1,000 mcg Oral Daily  . vitamin E  400 Units Oral QHS   Continuous:  JIZ:[id number] **OR** acetaminophen, ALPRAZolam, diphenhydrAMINE, hydrocortisone cream, levalbuterol, nitroGLYCERIN, ondansetron **OR** ondansetron (ZOFRAN) IV, oxyCODONE-acetaminophen **AND** oxyCODONE  Assesment: He was admitted with acute on chronic systolic heart failure. He has ischemic cardiomyopathy. He has generally done worse over the last 6 months or so. He has COPD at baseline and also has silicosis. I discussed his situation with him. We discussed palliative care which he does not have any interest in and we discussed the possibility of  rehabilitation which she would be interested in. Principal Problem:   Acute on chronic systolic heart failure Active Problems:   Silicosis   Ischemic cardiomyopathy   COPD (chronic obstructive pulmonary disease)   Acute on chronic respiratory failure   Acute systolic CHF (congestive heart failure)   Diabetes mellitus type 2, uncontrolled    Plan: PT consultation to see if he is a candidate for a rehabilitation stay. He is not ready for discharge at this time.    LOS: 3 days   Prentiss Hammett L 06/07/2014, 8:57 AM

## 2014-06-07 NOTE — Consult Note (Signed)
[facility].  Spoke with Arlyss Queenammy Blackwell, [facility] regarding referral.  Patient's telephone number given for follow up on referral as he is in an inpatient room.  Called the room and currently no answer to the telephone.  RNCM will continue to follow up on referral.  Please note that [facility] services does not interfere with or replace any services arranged by the inpatient care management team.  For questions, please contact Charlesetta ShanksVictoria Elara Cocke, RN BSN, [facility] Liaison Nurse at [phone].

## 2014-06-07 NOTE — Consult Note (Addendum)
Consulting cardiologist: Prentice DockerKoneswaran , Suresh MD Primary Cardiologist: Dina RichBranch, Jonathan MD  Cardiology Specific Problem List:  1. End Stage ICM 2. Systolic CHF 3. Elevated Troponin  Subjective:    Tired, Depressed, Still having shortness of breath with minimal exertion.   Objective:   Temp:  [97.7 F (36.5 C)-99 F (37.2 C)] 99 F (37.2 C) (03/01 0606) Pulse Rate:  [85-109] 100 (03/01 0606) Resp:  [18-20] 18 (03/01 0606) BP: (85-116)/(51-73) 92/73 mmHg (03/01 0606) SpO2:  [91 %-97 %] 91 % (03/01 0606) Weight:  [103.375 kg (227 lb 14.4 oz)] 103.375 kg (227 lb 14.4 oz) (03/01 0606) Last BM Date: 06/03/14  Filed Weights   06/05/14 0852 06/06/14 0602 06/07/14 0606  Weight: 105.96 kg (233 lb 9.6 oz) 103.647 kg (228 lb 8 oz) 103.375 kg (227 lb 14.4 oz)    Intake/Output Summary (Last 24 hours) at 06/07/14 0756 Last data filed at 06/07/14 0700  Gross per 24 hour  Intake   1183 ml  Output   3475 ml  Net  -2292 ml    Telemetry:109-138 bpm  Exam:  General: No acute distress.  HEENT: Conjunctiva and lids normal, oropharynx clear.  Lungs: Mild crackles in the bases  Cardiac: No elevated JVP or bruits. RRR, tachycardic no gallop or rub.   Abdomen: Normoactive bowel sounds, nontender, nondistended.  Extremities: No pitting edema, distal pulses full.  Neuropsychiatric: Alert and oriented x3, affect appropriate.   Lab Results:  Basic Metabolic Panel:  Recent Labs Lab 06/05/14 0606 06/06/14 0726 06/07/14 0625  NA 137 133* 140  K 4.4 3.4* 4.0  CL 96 95* 93*  CO2 32 30 34*  GLUCOSE 151* 135* 78  BUN 33* 41* 44*  CREATININE 1.08 1.32 1.17  CALCIUM 9.7 8.8 9.7    Liver Function Tests:  Recent Labs Lab 06/04/14 0325  AST 11  ALT 9  ALKPHOS 63  BILITOT 0.7  PROT 5.9*  ALBUMIN 3.0*    CBC:  Recent Labs Lab 06/04/14 0325 06/04/14 0848 06/05/14 0606  WBC 8.5 10.0 20.0*  HGB 10.1* 11.6* 12.1*  HCT 30.6* 35.1* 36.7*  MCV 88.7 89.1 89.1  PLT  200 205 258    Cardiac Enzymes:  Recent Labs Lab 06/05/14 0606 06/05/14 1146 06/05/14 1723  TROPONINI 0.34* 0.36* 0.39*    BNP:  Recent Labs  07/30/13 1523 02/25/14 2200  PROBNP 604.2* 1014.0*     Medications:   Scheduled Medications: . antiseptic oral rinse  7 mL Mouth Rinse BID  . aspirin  81 mg Oral Daily  . atorvastatin  80 mg Oral q1800  . budesonide (PULMICORT) nebulizer solution  0.25 mg Nebulization BID  . carvedilol  6.25 mg Oral BID  . clopidogrel  75 mg Oral Daily  . digoxin  0.25 mg Oral Daily  . enoxaparin (LOVENOX) injection  40 mg Subcutaneous Q24H  . feeding supplement (ENSURE COMPLETE)  237 mL Oral BID BM  . furosemide  80 mg Intravenous Q12H  . insulin aspart  0-15 Units Subcutaneous TID WC  . insulin aspart  0-5 Units Subcutaneous QHS  . insulin NPH Human  40 Units Subcutaneous QAC breakfast   And  . insulin NPH Human  40 Units Subcutaneous QHS  . ipratropium  0.5 mg Nebulization TID  . isosorbide mononitrate  60 mg Oral Daily  . levalbuterol  0.63 mg Nebulization TID  . polyethylene glycol  17 g Oral Daily  . potassium chloride  20 mEq Oral BID  . predniSONE  10 mg Oral Q breakfast  . sertraline  50 mg Oral Daily  . sodium chloride  3 mL Intravenous Q12H  . sodium chloride  3 mL Intravenous Q12H  . vitamin B-12  1,000 mcg Oral Daily  . vitamin E  400 Units Oral QHS    Infusions:    PRN Medications: acetaminophen **OR** acetaminophen, ALPRAZolam, diphenhydrAMINE, hydrocortisone cream, levalbuterol, nitroGLYCERIN, ondansetron **OR** ondansetron (ZOFRAN) IV, oxyCODONE-acetaminophen **AND** oxyCODONE   Assessment and Plan:   1. Decompensated CHF with ICM EF of 20%: Has diuresed 5.8 liters since admission. Appears more compensated but still easily dyspneic with minimal exertion. Lung sounds are diminished in the bases. Wt is down 1 lb from yesterday. Do not think this is indicative of his total diureses. Wt on admission 233 lbs with wt loss  of 6 lbs documented. He continues on IV lasix, digoxin. May need one more day of IV lasix before transition to po.Creatinine 1.17. CO 2 34.   2. Tachycardia: Discussion to use ivabradine to slow HR at 2.5 mg BID to assist with this as he is not a candidate for coreg due to possible hypotension with COPD and increased issue with bronchospasm.. As he is on digoxin, and hypotensive, I will wait to start until evaluated by Dr. Purvis Sheffield. Would consider reloading  digoxin IV as his level was low on admission before starting ivabradine.   3. CAD: Hx of CABG: Continue Plavix and ASA. BB stopped.   4. COPD: On abuterol inhalers and neb tx. Change to Xopenex as this has less affect on the HR and may help improve this.   Bettey Mare. Lawrence NP AACC  06/07/2014, 7:56 AM   The patient was seen and examined, and I agree with the assessment and plan as documented above, with modifications as noted below. Pt has had adequate diuresis reflected by both rising BUN and CO2. Will d/c IV Lasix and start torsemide 20 mg bid, first dose this evening. Would not abruptly withdraw beta blockers in the setting of acute decompensated heart failure, in spite of SBP in 90 mmHg range. Remains tachycardic but unable to increase Coreg dose due to hypotension. Will given IV digoxin x 1 today with consideration for ivabradine on 3/2, which will not lower BP. Continue ASA, Plavix, and Imdur. Agree with palliative care recommendation by Dr. Juanetta Gosling.

## 2014-06-07 NOTE — Evaluation (Signed)
Physical Therapy Evaluation Patient Details Name: Timothy Trujillo MRN: [id number] DOB: Jun 30, 1950 Today's Date: 06/07/2014   History of Present Illness  HPI: Timothy Trujillo is a 64 y.o. male with history of severe COPD, CAD status post CABG last cardiac cath in October 2014, ischemic cardiomyopathy last year measured last month January 2016 was 20-25%, diabetes mellitus type 2, chronic tachycardia presents to the ER because of sudden onset of worsening shortness of breath since last evening. Patient also has been having chest pain around left side after the shortness of breath started. In the ER patient had chest x-ray which shows moderate CHF and patient was given Lasix 60 mg IV. Patient was given nitroglycerin patch following which patient blood pressure drop and has been removed at this time. On exam patient does not have any acute wheezing or peripheral edema. Patient will be admitted for acute on chronic respiratory failure secondary to CHF.  Clinical Impression  Pt was seen for evaluation this afternoon.  He was very alert and cooperative, on 3 L O2 with O2 sat=97%.  He lives alone but grandson is there very often to assist him.  Pt states that he uses an power w/c or "furniture walks" in the home for limited mobility.  He states that he feels very weak now due to recent illness but even before this he was having difficulty even getting food for himself.  He is found to have LE strength in the 2/5 range.  He needed supervision for transfer out of bed but was able to ambulate 20' using a walker with good stability.  His O2 sat was 95% after gait on 3 L O2.  Because of his significant weakness, difficulty managing at home and his ability to do some activity with no drop in O2 sat I am recommending SNF at d/c.  Pt is open to considering this.  He did ask me about Hospice and said that Dr. Juanetta Gosling had mentioned this to him this morning.  He said that he was not feeling well at that time and did not express  any interest in Hospice.  I did say that my understanding was that MD was talking about Palliative Care and not Hospice. Pt asked what the difference was and I am afraid that I could not give him a very good answer.  I have mentioned this to Arlyss Queen, RN and asked her to make sure that someone with knowledge explain the Palliative Care v.s. Hospice services.  Pt did say that he "wants to get better".    Follow Up Recommendations SNF    Equipment Recommendations   (would benefit from a Rollator)    Recommendations for Other Services   OT    Precautions / Restrictions Precautions Precautions: Fall Restrictions Weight Bearing Restrictions: No      Mobility  Bed Mobility Overal bed mobility: Needs Assistance Bed Mobility: Supine to Sit     Supine to sit: Supervision;HOB elevated        Transfers Overall transfer level: Needs assistance Equipment used: Rolling walker (2 wheeled) Transfers: Sit to/from Stand Sit to Stand: Min guard            Ambulation/Gait Ambulation/Gait assistance: Supervision Ambulation Distance (Feet): 20 Feet       Gait velocity interpretation: Below normal speed for age/gender General Gait Details: O2 sat = 95% on 3 L O2 with gait  Stairs            Wheelchair Mobility    Modified  Rankin (Stroke Patients Only)       Balance Overall balance assessment: No apparent balance deficits (not formally assessed)                                           Pertinent Vitals/Pain Pain Assessment: No/denies pain    Home Living Family/patient expects to be discharged to:: Skilled nursing facility (to be determined)                      Prior Function     Gait / Transfers Assistance Needed: mobilizes with a power w/c or "furniture walks" short distances in the home without assist  ADL's / Homemaking Assistance Needed: able to dress himself, needs SBA with bathing...has difficulty standing in the kitchen  to prepare a meal        Hand Dominance        Extremity/Trunk Assessment   Upper Extremity Assessment: Generalized weakness;LUE deficits/detail       LUE Deficits / Details: limited shoulder ROM due to a torn rotator cuff   Lower Extremity Assessment: Generalized weakness (Left extremeties 1/2 grade weaker than Right due to old stroke...strength is generally 2/5)         Communication   Communication: No difficulties  Cognition Arousal/Alertness: Awake/alert Behavior During Therapy: WFL for tasks assessed/performed Overall Cognitive Status: Within Functional Limits for tasks assessed                      General Comments      Exercises        Assessment/Plan    PT Assessment Patient needs continued PT services  PT Diagnosis Difficulty walking;Generalized weakness   PT Problem List Decreased strength;Decreased activity tolerance;Decreased mobility;Cardiopulmonary status limiting activity  PT Treatment Interventions Gait training;Functional mobility training;Therapeutic exercise   PT Goals (Current goals can be found in the Care Plan section) Acute Rehab PT Goals Patient Stated Goal: to be able to stand at the kitchen counter long enough to get a meal together PT Goal Formulation: With patient Time For Goal Achievement: 06/21/14 Potential to Achieve Goals: Fair    Frequency Min 3X/week   Barriers to discharge Decreased caregiver support lives alone with grandson there intermittently    Co-evaluation               End of Session Equipment Utilized During Treatment: Gait belt;Oxygen Activity Tolerance: Patient tolerated treatment well Patient left: in bed;with call bell/phone within reach Nurse Communication: Mobility status         Time: [id number] PT Time Calculation (min) (ACUTE ONLY): 46 min   Charges:   PT Evaluation $Initial PT Evaluation Tier I: 1 Procedure     PT G CodesMyrlene Broker:        Markhi Kleckner L 06/07/2014, 4:09 PM

## 2014-06-08 DIAGNOSIS — I25812 Atherosclerosis of bypass graft of coronary artery of transplanted heart without angina pectoris: Secondary | ICD-10-CM

## 2014-06-08 LAB — GLUCOSE, CAPILLARY
GLUCOSE-CAPILLARY: 327 mg/dL — AB (ref 70–99)
GLUCOSE-CAPILLARY: 205 mg/dL — AB (ref 70–99)
Glucose-Capillary: 204 mg/dL — ABNORMAL HIGH (ref 70–99)
Glucose-Capillary: 282 mg/dL — ABNORMAL HIGH (ref 70–99)

## 2014-06-08 LAB — BASIC METABOLIC PANEL
Anion gap: 11 (ref 5–15)
BUN: 43 mg/dL — AB (ref 6–23)
CHLORIDE: 93 mmol/L — AB (ref 96–112)
CO2: 30 mmol/L (ref 19–32)
CREATININE: 1.25 mg/dL (ref 0.50–1.35)
Calcium: 9.6 mg/dL (ref 8.4–10.5)
GFR calc Af Amer: 68 mL/min — ABNORMAL LOW (ref >90)
GFR calc non Af Amer: 59 mL/min — ABNORMAL LOW (ref >90)
GLUCOSE: 293 mg/dL — AB (ref 70–99)
Potassium: 4.8 mmol/L (ref 3.5–5.1)
SODIUM: 134 mmol/L — AB (ref 135–145)

## 2014-06-08 MED ORDER — DIGOXIN 0.25 MG/ML IJ SOLN
0.2500 mg | Freq: Once | INTRAMUSCULAR | Status: AC
  Administered 2014-06-08: 0.25 mg via INTRAVENOUS
  Filled 2014-06-08: qty 2

## 2014-06-08 NOTE — Consult Note (Signed)
Late entry 06/07/14 at 2:14 pm:  Spoke with the patient via telephone, [phone] in his room. HIPPA verified.  Explained [facility].  [facility].  Patient verbally consented to the services.  Patient will be provided with telephone calls and be evaluated for monthly home visits.  [facility] does not interfere with or replace any services arranged by the inpatient care management team.  For questions, please call Charlesetta ShanksVictoria Alisyn Lequire, RN, BSN, CCM

## 2014-06-08 NOTE — Progress Notes (Signed)
Subjective: He says he feels a little bit better. His blood sugar has been up and down. He still has some tachycardia  Objective: Vital signs in last 24 hours: Temp:  [97.5 F (36.4 C)-98.8 F (37.1 C)] 98.8 F (37.1 C) (03/02 0500) Pulse Rate:  [87-105] 87 (03/02 0500) Resp:  [18] 18 (03/02 0500) BP: (106-118)/(57-70) 108/70 mmHg (03/02 0500) SpO2:  [91 %-100 %] 91 % (03/02 0716) Weight:  [102.331 kg (225 lb 9.6 oz)] 102.331 kg (225 lb 9.6 oz) (03/02 0500) Weight change: -1.043 kg (-2 lb 4.8 oz) Last BM Date: 06/06/14  Intake/Output from previous day: 03/01 0701 - 03/02 0700 In: 486 [P.O.:480; I.V.:6] Out: 1375 [Urine:1375]  PHYSICAL EXAM General appearance: alert, cooperative and mild distress Resp: rhonchi bilaterally Cardio: regular rate and rhythm, S1, S2 normal, no murmur, click, rub or gallop GI: soft, non-tender; bowel sounds normal; no masses,  no organomegaly Extremities: extremities normal, atraumatic, no cyanosis or edema  Lab Results:  Results for orders placed or performed during the hospital encounter of 06/04/14 (from the past 48 hour(s))  Glucose, capillary     Status: Abnormal   Collection Time: 06/06/14 11:27 AM  Result Value Ref Range   Glucose-Capillary 110 (H) 70 - 99 mg/dL  Glucose, capillary     Status: Abnormal   Collection Time: 06/06/14  4:49 PM  Result Value Ref Range   Glucose-Capillary 204 (H) 70 - 99 mg/dL  Glucose, capillary     Status: Abnormal   Collection Time: 06/06/14  8:45 PM  Result Value Ref Range   Glucose-Capillary 110 (H) 70 - 99 mg/dL  Glucose, capillary     Status: None   Collection Time: 06/07/14  1:43 AM  Result Value Ref Range   Glucose-Capillary 83 70 - 99 mg/dL  Basic metabolic panel     Status: Abnormal   Collection Time: 06/07/14  6:25 AM  Result Value Ref Range   Sodium 140 135 - 145 mmol/L    Comment: DELTA CHECK NOTED   Potassium 4.0 3.5 - 5.1 mmol/L   Chloride 93 (L) 96 - 112 mmol/L   CO2 34 (H) 19 - 32  mmol/L   Glucose, Bld 78 70 - 99 mg/dL   BUN 44 (H) 6 - 23 mg/dL   Creatinine, Ser 1.17 0.50 - 1.35 mg/dL   Calcium 9.7 8.4 - 10.5 mg/dL   GFR calc non Af Amer 64 (L) >90 mL/min   GFR calc Af Amer 74 (L) >90 mL/min    Comment: (NOTE) The eGFR has been calculated using the CKD EPI equation. This calculation has not been validated in all clinical situations. eGFR's persistently <90 mL/min signify possible Chronic Kidney Disease.    Anion gap 13 5 - 15  Glucose, capillary     Status: None   Collection Time: 06/07/14  7:42 AM  Result Value Ref Range   Glucose-Capillary 78 70 - 99 mg/dL   Comment 1 Notify RN   Glucose, capillary     Status: Abnormal   Collection Time: 06/07/14 11:31 AM  Result Value Ref Range   Glucose-Capillary 210 (H) 70 - 99 mg/dL   Comment 1 Notify RN   Glucose, capillary     Status: Abnormal   Collection Time: 06/07/14  4:43 PM  Result Value Ref Range   Glucose-Capillary 297 (H) 70 - 99 mg/dL   Comment 1 Notify RN   Glucose, capillary     Status: Abnormal   Collection Time: 06/07/14  8:45 PM  Result Value Ref Range   Glucose-Capillary 327 (H) 70 - 99 mg/dL   Comment 1 Notify RN   Glucose, capillary     Status: Abnormal   Collection Time: 06/08/14  7:39 AM  Result Value Ref Range   Glucose-Capillary 204 (H) 70 - 99 mg/dL   Comment 1 Notify RN    Comment 2 Document in Chart     ABGS No results for input(s): PHART, PO2ART, TCO2, HCO3 in the last 72 hours.  Invalid input(s): PCO2 CULTURES No results found for this or any previous visit (from the past 240 hour(s)). Studies/Results: No results found.  Medications:  Prior to Admission:  Prescriptions prior to admission  Medication Sig Dispense Refill Last Dose  . ALPRAZolam (XANAX) 0.25 MG tablet Take 1 tablet (0.25 mg total) by mouth 2 (two) times daily as needed for anxiety. 60 tablet 5 Past Week at Unknown time  . aspirin 325 MG tablet Take 325 mg by mouth daily.   Past Week at Unknown time  .  Aspirin-Salicylamide-Caffeine (BC FAST PAIN RELIEF ARTHRITIS) 062-376-28 MG PACK Take 1 packet by mouth 3 (three) times daily as needed (for pain).   Past Week at Unknown time  . aspirin-sod bicarb-citric acid (ALKA-SELTZER) 325 MG TBEF tablet Take 650 mg by mouth every 6 (six) hours as needed (pain).   unknown  . carvedilol (COREG) 6.25 MG tablet Take 1 tablet (6.25 mg total) by mouth 2 (two) times daily. 180 tablet 3 06/04/2014 at 1700  . clopidogrel (PLAVIX) 75 MG tablet Take 1 tablet (75 mg total) by mouth daily. 90 tablet 3 06/03/2014 at Unknown time  . diphenhydrAMINE (BENADRYL) 50 MG tablet Take 1 tablet (50 mg total) by mouth every 6 (six) hours as needed for itching. 120 tablet 5 unknown  . furosemide (LASIX) 80 MG tablet Take 80 mg by mouth 2 (two) times daily as needed for fluid.    06/03/2014 at Unknown time  . hydrocortisone 2.5 % cream Apply 1 application topically daily as needed (itching).    unknown  . insulin NPH Human (HUMULIN N,NOVOLIN N) 100 UNIT/ML injection Inject 0.75 mLs (75 Units total) into the skin 2 (two) times daily. (Patient taking differently: Inject 110 Units into the skin 2 (two) times daily. ) 10 mL 11 06/03/2014 at Unknown time  . isosorbide mononitrate (IMDUR) 60 MG 24 hr tablet Take 1 tablet (60 mg total) by mouth daily. 90 tablet 3 06/03/2014 at Unknown time  . nitroGLYCERIN (NITROSTAT) 0.4 MG SL tablet Place 1 tablet (0.4 mg total) under the tongue every 5 (five) minutes as needed for chest pain. 25 tablet 3 unknown  . oxyCODONE-acetaminophen (PERCOCET) 10-325 MG per tablet Take 1 tablet by mouth every 4 (four) hours as needed for pain.   unknown  . sertraline (ZOLOFT) 50 MG tablet Take 1 tablet (50 mg total) by mouth daily. 30 tablet 12 06/03/2014 at Unknown time  . vitamin B-12 (CYANOCOBALAMIN) 1000 MCG tablet Take 1,000 mcg by mouth daily.   06/03/2014 at Unknown time  . vitamin C (ASCORBIC ACID) 500 MG tablet Take 1,000 mg by mouth at bedtime.   06/03/2014 at Unknown  time  . vitamin E 400 UNIT capsule Take 400 Units by mouth at bedtime.    06/03/2014 at Unknown time   Scheduled: . antiseptic oral rinse  7 mL Mouth Rinse BID  . aspirin  81 mg Oral Daily  . atorvastatin  80 mg Oral q1800  . budesonide (PULMICORT) nebulizer solution  0.25  mg Nebulization BID  . carvedilol  6.25 mg Oral BID  . clopidogrel  75 mg Oral Daily  . digoxin  0.25 mg Oral Daily  . enoxaparin (LOVENOX) injection  40 mg Subcutaneous Q24H  . feeding supplement (ENSURE COMPLETE)  237 mL Oral BID BM  . insulin aspart  0-15 Units Subcutaneous TID WC  . insulin aspart  0-5 Units Subcutaneous QHS  . insulin NPH Human  20 Units Subcutaneous QHS   And  . insulin NPH Human  40 Units Subcutaneous QAC breakfast  . ipratropium  0.5 mg Nebulization BID  . isosorbide mononitrate  60 mg Oral Daily  . levalbuterol  0.63 mg Nebulization BID  . polyethylene glycol  17 g Oral Daily  . potassium chloride  20 mEq Oral BID  . predniSONE  10 mg Oral Q breakfast  . sertraline  50 mg Oral Daily  . sodium chloride  3 mL Intravenous Q12H  . sodium chloride  3 mL Intravenous Q12H  . torsemide  20 mg Oral BID  . vitamin B-12  1,000 mcg Oral Daily  . vitamin E  400 Units Oral QHS   Continuous:  FXO:VANVBTYOMAYOK **OR** acetaminophen, ALPRAZolam, diphenhydrAMINE, hydrocortisone cream, levalbuterol, nitroGLYCERIN, ondansetron **OR** ondansetron (ZOFRAN) IV, oxyCODONE-acetaminophen **AND** oxyCODONE  Assesment: He was admitted with acute on chronic systolic heart failure. He is known to have ischemic cardiomyopathy. He has shortness of breath related to that but he also has COPD which contributes. He's been having trouble with tachycardia and has been changed to Xopenex which I think is appropriate. He has been assessed by physical therapy and it is suggested that he go to skilled care facility for rehabilitation. There is discussion of having another medication added to help slow his heart rate. I discussed  palliative care or hospice care with him and he has no interest in either at this time Principal Problem:   Acute on chronic systolic heart failure Active Problems:   Silicosis   Ischemic cardiomyopathy   COPD (chronic obstructive pulmonary disease)   Acute on chronic respiratory failure   Acute systolic CHF (congestive heart failure)   Diabetes mellitus type 2, uncontrolled    Plan: Continue other treatments. He will be sent to the skilled care facility when stable but I'd like to see his heart rate better before he goes    LOS: 4 days   Lesle Faron L 06/08/2014, 8:47 AM

## 2014-06-08 NOTE — Progress Notes (Signed)
Subjective:  Doesn't feel well. Blood sugars up and down, nauseated yesterday and didn't sleep well. Breathing OK.  Objective:  Vital Signs in the last 24 hours: Temp:  [97.5 F (36.4 C)-98.8 F (37.1 C)] 98.8 F (37.1 C) (03/02 0500) Pulse Rate:  [87-105] 87 (03/02 0500) Resp:  [18] 18 (03/02 0500) BP: (106-118)/(57-70) 108/70 mmHg (03/02 0500) SpO2:  [91 %-100 %] 91 % (03/02 0716) Weight:  [225 lb 9.6 oz (102.331 kg)] 225 lb 9.6 oz (102.331 kg) (03/02 0500)  Intake/Output from previous day: 03/01 0701 - 03/02 0700 In: 486 [P.O.:480; I.V.:6] Out: 1375 [Urine:1375] Intake/Output from this shift:    Physical Exam: NECK: Without JVD, HJR, or bruit LUNGS: Decreased BS but Clear anterior, posterior, lateral HEART: Regular rate and rhythm, no murmur, gallop, rub, bruit, thrill, or heave EXTREMITIES: Without cyanosis, clubbing, or edema  Lab Results: No results for input(s): WBC, HGB, PLT in the last 72 hours.  Recent Labs  06/06/14 0726 06/07/14 0625  NA 133* 140  K 3.4* 4.0  CL 95* 93*  CO2 30 34*  GLUCOSE 135* 78  BUN 41* 44*  CREATININE 1.32 1.17    Recent Labs  06/05/14 1146 06/05/14 1723  TROPONINI 0.36* 0.39*   Hepatic Function Panel No results for input(s): PROT, ALBUMIN, AST, ALT, ALKPHOS, BILITOT, BILIDIR, IBILI in the last 72 hours. No results for input(s): CHOL in the last 72 hours. No results for input(s): PROTIME in the last 72 hours.  Imaging:   Cardiac Studies:  Assessment/Plan:  1. Decompensated CHF with ICM EF of 20%: now compensated.Has diuresed 5.8 liters since admission. IV Lasix changed to Torsemide yest. Negative 549 cc yesterday. Palliative care per Dr. Juanetta GoslingHawkins.  2. Tachycardia: Heart rate 87-105 Discussion to use ivabradine to slow HR at 2.5 mg BID to assist with this as he is not a candidate for coreg due to possible hypotension with COPD and increased issue with bronchospasm.[name]. Await evaluation by Dr. Purvis SheffieldKoneswaran.  3. CAD: Hx of  CABG: Continue Plavix and ASA. BB stopped.   4. COPD: On abuterol inhalers and neb tx. Change to Xopenex as this has less affect on the HR and may help improve this.     LOS: 4 days    Jacolyn ReedyMichele Lenze 06/08/2014, 8:29 AM   The patient was seen and examined, and I agree with the assessment and plan as documented above, with modifications as noted below. Compensated heart failure on torsemide 20 mg bid initiated last night. HR better controlled today after IV digoxin yesterday. I will given another dose today IV and continue maintenance therapy with 0.25 mg daily. Will hold off on ivabradine but could be considered as outpatient. Continue ASA, Plavix, and Imdur. Agree with palliative care recommendation by Dr. Juanetta GoslingHawkins.

## 2014-06-08 NOTE — Clinical Social Work Placement (Deleted)
Clinical Social Work Department CLINICAL SOCIAL WORK PLACEMENT NOTE 06/08/2014  Patient: Timothy Trujillo,Timothy Trujillo Account Number: [id number] Admit date: 06/07/2014  Clinical Social Worker: Estel Scholze, LCSWA Date/time: 06/08/2014 12:49 PM  Clinical Social Work is seeking post-discharge placement for this patient at the following level of care: SKILLED NURSING (*CSW will update this form in Epic as items are completed)   06/08/2014 Patient/family provided with [facility] Department of Clinical Social Work's list of facilities offering this level of care within the geographic area requested by the patient (or if unable, by the patient's family).  06/08/2014 Patient/family informed of their freedom to choose among providers that offer the needed level of care, that participate in Medicare, Medicaid or managed care program needed by the patient, have an available bed and are willing to accept the patient.  06/08/2014 Patient/family informed of [facility]' ownership interest in [facility], as well as of the fact that they are under no obligation to receive care at this facility.  PASARR submitted to EDS on 06/08/2014 PASARR number received on 06/08/2014  FL2 transmitted to all facilities in geographic area requested by pt/family on 06/08/2014 FL2 transmitted to all facilities within larger geographic area on   Patient informed that his/her managed care company has contracts with or will negotiate with certain facilities, including the following:    Patient/family informed of bed offers received:  Patient chooses bed at  Physician recommends and patient chooses bed at   Patient to be transferred to on  Patient to be transferred to facility by  Patient and family notified of transfer on  Name of family member notified:   The following physician request were entered in Epic:   Additional Comments: Kelvin Sennett, MSW, LCSWA [phone]              

## 2014-06-08 NOTE — Progress Notes (Signed)
Inpatient Diabetes Program Recommendations  AACE/ADA: New Consensus Statement on Inpatient Glycemic Control (2013)  Target Ranges:  Prepandial:   less than 140 mg/dL      Peak postprandial:   less than 180 mg/dL (1-2 hours)      Critically ill patients:  140 - 180 mg/dL   Results for [name], Enoch L (MRN [id number]) as of 06/08/2014 08:14  Ref. Range 06/07/2014 07:42 06/07/2014 11:31 06/07/2014 16:43 06/07/2014 20:45 06/08/2014 07:39  Glucose-Capillary Latest Range: 70-99 mg/dL 78 409210 (H) 811297 (H) 914327 (H) 204 (H)   Current orders for Inpatient glycemic control: NPH 40 units QAM, NPH 20 units QHS, Novolog 0-15 units TID with meals, Novolog 0-5 units HS  Inpatient Diabetes Program Recommendations Insulin - Basal: Noted NPH orders were changed yesterday due to hypoglycemia. As a result of orders being changed, morning NPH dose was not given yesterday. Therefore, CBG up to 327 mg/dl yesterday afternoon. The only dose of NPH given yesterday was 20 units at bedtime. Fasting glucose is 204 mg/dl this morning. Please consider increasing bedtime dose of NPH to 25 units QHS and leave morning dose as ordered.  Thanks, Orlando PennerMarie Cigi Bega, RN, MSN, CCRN, CDE Diabetes Coordinator Inpatient Diabetes Program [phone] (Team Pager) [phone] ([facility] office) [phone] [facility] office)

## 2014-06-08 NOTE — Evaluation (Signed)
Occupational Therapy Evaluation Patient Details Name: Timothy Trujillo MRN: [id number] DOB: [birth year] Today's Date: 06/08/2014    History of Present Illness HPI: Timothy Trujillo is a 64 y.o. male with history of severe COPD, CAD status post CABG last cardiac cath in October 2014, ischemic cardiomyopathy last year measured last month January 2016 was 20-25%, diabetes mellitus type 2, chronic tachycardia presents to the ER because of sudden onset of worsening shortness of breath since last evening. Patient also has been having chest pain around left side after the shortness of breath started. In the ER patient had chest x-ray which shows moderate CHF and patient was given Lasix 60 mg IV. Patient was  admitted for acute on chronic respiratory failure secondary to CHF.   Clinical Impression   PTA pt lived at home alone, with assistance from family as needed. Grandson checks on pt every day. Pt required min A for sit<>stand toilet transfer. Pt required min guard during functional mobility task. Pt reports completing ADLs when at home, however tires very easily. Pt reports that he requires total assistance with IADLs, pt does not cook due to fear of falling near stove. Pt demonstrates decreased strength and activity tolerance, and would benefit from SNF rehab services to increase functioning and independence in ADLs and functional activities.     Follow Up Recommendations  SNF    Equipment Recommendations  None recommended by OT       Precautions / Restrictions Precautions Precautions: Fall Restrictions Weight Bearing Restrictions: No      Mobility Bed Mobility Overal bed mobility: Needs Assistance Bed Mobility: Sit to Supine       Sit to supine: Supervision      Transfers Overall transfer level: Needs assistance Equipment used: Rolling walker (2 wheeled) Transfers: Sit to/from Stand Sit to Stand: Min assist                   ADL Overall ADL's : Needs  assistance/impaired Eating/Feeding: Independent       Upper Body Bathing: Supervision/ safety (Per pt report)   Lower Body Bathing: Supervison/ safety (per pt report)   Upper Body Dressing : Independent (per pt report)   Lower Body Dressing: Independent (per pt report)   Toilet Transfer: Minimal assistance Toilet Transfer Details (indicate cue type and reason): Pt requires min assist for sit to stand, using grab bar.          Functional mobility during ADLs: Min guard;Rolling walker General ADL Comments: Pt is able to perform some BADLs, needs total assistance with IADLs.                Pertinent Vitals/Pain Pain Assessment: No/denies pain     Hand Dominance     Extremity/Trunk Assessment Upper Extremity Assessment Upper Extremity Assessment: Generalized weakness;RUE deficits/detail RUE Deficits / Details: Limited shoulder ROM due to rotator cuff injury           Communication Communication Communication: No difficulties   Cognition Arousal/Alertness: Awake/alert Behavior During Therapy: WFL for tasks assessed/performed Overall Cognitive Status: Within Functional Limits for tasks assessed                                Home Living Family/patient expects to be discharged to:: Skilled nursing facility (To be determined) Living Arrangements: Alone  Additional Comments: Pt's grandson comes by to check on him 2x/day and stays every weekend, Friday-Sunday.       Prior Functioning/Environment Level of Independence: Needs assistance  Gait / Transfers Assistance Needed: Uses power w/c for functional mobility or holds onto wall/furniture when moving around ADL's / Homemaking Assistance Needed: Pt is able to perform some BADLs, needs assistance with IADLs.                                   End of Session Equipment Utilized During Treatment: Gait belt;Rolling walker;Oxygen  Activity Tolerance:  Patient tolerated treatment well Patient left: in bed;with call bell/phone within reach   Time: 0822-0836 OT Time Calculation (min): 14 min Charges:  OT General Charges $OT Visit: 1 Procedure OT Evaluation $Initial OT Evaluation Tier I: 1 Procedure G-Codes:    Ezra SitesLeslie Troxler, OTR/L [phone]  06/08/2014, 9:12 AM

## 2014-06-08 NOTE — Progress Notes (Signed)
Physical Therapy Treatment Patient Details Name: Timothy Trujillo MRN: [id number] DOB: 1950/05/14 Today's Date: 06/08/2014    History of Present Illness HPI: Timothy Trujillo is a 64 y.o. male with history of severe COPD, CAD status post CABG last cardiac cath in October 2014, ischemic cardiomyopathy last year measured last month January 2016 was 20-25%, diabetes mellitus type 2, chronic tachycardia presents to the ER because of sudden onset of worsening shortness of breath since last evening. Patient also has been having chest pain around left side after the shortness of breath started. In the ER patient had chest x-ray which shows moderate CHF and patient was given Lasix 60 mg IV. Patient was given nitroglycerin patch following which patient blood pressure drop and has been removed at this time. On exam patient does not have any acute wheezing or peripheral edema. Patient was  admitted for acute on chronic respiratory failure secondary to CHF.    PT Comments    Pt states cardiac MD had mentioned Hospice to pt.  Pt had multiple questions that therapist tried to answer honestly and openly with pt about hospice vs. SNF placement.  Follow Up Recommendations  SNF     Equipment Recommendations  None recommended by PT       Precautions / Restrictions Precautions Precautions: Fall Restrictions Weight Bearing Restrictions: No    Mobility  Bed Mobility Overal bed mobility: Modified Independent Bed Mobility: Sit to Supine     Supine to sit: Modified independent (Device/Increase time) Sit to supine: Modified independent (Device/Increase time)   General bed mobility comments: Pt became dizzy after sitting x  minutes did not feel he could sit down   Transfers Overall transfer level: Needs assistance Equipment used: Rolling walker (2 wheeled) Transfers: Sit to/from Stand Sit to Stand: Min assist                       Cognition Arousal/Alertness: Awake/alert Behavior During  Therapy: WFL for tasks assessed/performed Overall Cognitive Status: Within Functional Limits for tasks assessed                      Exercises General Exercises - Lower Extremity Ankle Circles/Pumps: Both;10 reps Quad Sets: Both;10 reps Gluteal Sets: Both;10 reps Long Arc Quad: Both;10 reps Heel Slides: Both;10 reps Hip ABduction/ADduction: Both;10 reps Straight Leg Raises: Both;5 reps        Pertinent Vitals/Pain Pain Assessment: 0-10 Pain Score: 3  Pain Location: nback Pain Descriptors / Indicators: Aching    Home Living Family/patient expects to be discharged to:: Skilled nursing facility (To be determined) Living Arrangements: Alone             Additional Comments: Pt's grandson comes by to check on him 2x/day and stays every weekend, Friday-Sunday.     Prior Function Level of Independence: Needs assistance  Gait / Transfers Assistance Needed: Uses power w/c for functional mobility or holds onto wall/furniture when moving around ADL's / Homemaking Assistance Needed: Pt is able to perform some BADLs, needs assistance with IADLs.      PT Goals (current goals can now be found in the care plan section) Progress towards PT goals: Progressing toward goals    Frequency  Min 3X/week    PT Plan Current plan remains appropriate    Co-evaluation             End of Session   Activity Tolerance: Patient limited by fatigue Patient left: in bed;with call bell/phone within  reach     Time: 0955-1020 PT Time Calculation (min) (ACUTE ONLY): 25 min  Charges:  $Therapeutic Exercise: 23-37 mins                    G Codes:      Timothy Trujillo,Timothy Trujillo 06/08/2014, 10:49 AM

## 2014-06-08 NOTE — Clinical Social Work Placement (Signed)
Clinical Social Work Department CLINICAL SOCIAL WORK PLACEMENT NOTE 06/08/2014  Patient:  [name],Timothy Trujillo  Account Number:  [id number] Admit date:  06/04/2014  Clinical Social Worker:  Derenda FennelKARA Raed Schalk, LCSW  Date/time:  06/08/2014 10:12 AM  Clinical Social Work is seeking post-discharge placement for this patient at the following level of care:   SKILLED NURSING   (*CSW will update this form in Epic as items are completed)   06/08/2014  Patient/family provided with [facility] Department of Clinical Social Work's list of facilities offering this level of care within the geographic area requested by the patient (or if unable, by the patient's family).  06/08/2014  Patient/family informed of their freedom to choose among providers that offer the needed level of care, that participate in Medicare, Medicaid or managed care program needed by the patient, have an available bed and are willing to accept the patient.  06/08/2014  Patient/family informed of [facility]' ownership interest in [facility], as well as of the fact that they are under no obligation to receive care at this facility.  PASARR submitted to EDS on  PASARR number received on   FL2 transmitted to all facilities in geographic area requested by pt/family on  06/08/2014 FL2 transmitted to all facilities within larger geographic area on   Patient informed that his/her managed care company has contracts with or will negotiate with  certain facilities, including the following:     Patient/family informed of bed offers received:   Patient chooses bed at  Physician recommends and patient chooses bed at    Patient to be transferred to  on   Patient to be transferred to facility by  Patient and family notified of transfer on  Name of family member notified:    The following physician request were entered in Epic:   Additional Comments: Pt has existing pasarr.  Derenda FennelKara Carly Sabo, [place] [phone]

## 2014-06-08 NOTE — Clinical Social Work Psychosocial (Deleted)
Clinical Social Work Department BRIEF PSYCHOSOCIAL ASSESSMENT 06/08/2014  Patient:  Timothy Trujillo,Timothy Trujillo     Account Number:  [id number]     Admit date:  06/07/2014  Clinical Social Worker:  Robin SearingALDWELL,Greene Diodato, LCSWA  Date/Time:  06/08/2014 12:40 PM  Referred by:  Physician  Date Referred:  06/08/2014 Referred for  SNF Placement   Other Referral:   Interview type:  Other - See comment Other interview type:   Spoke with daughter Timothy Trujillo via phone [phone]    PSYCHOSOCIAL DATA Living Status:  FACILITY [facility] Level of care:  Assisted Living Primary support name:  Diane- daughter Primary support relationship to patient:  FAMILY Degree of support available:   good    CURRENT CONCERNS Current Concerns  Post-Acute Placement   Other Concerns:    SOCIAL WORK ASSESSMENT / PLAN CSW spoke with patient's daughter via phone- discussed the PT recommendations for SNF and she is agreeable to considering this- stating, " I think she has been needing to go for a while". Daughter reports concerns related to her postassium "she is alwasy very sleepy and slumped over in her chair".  CSW discussed SNF process and will proceed with SNF search for offers at dc. Daughter is appreciative of CSW update and was not certain if she had been admitted as she has been in bed sick-   She prefers SNF in the area so she will be close to family.   Assessment/plan status:  Other - See comment Other assessment/ plan:   Updating FL2 for SNF search   Information/referral to community resources:    PATIENT'S/FAMILY'S RESPONSE TO PLAN OF CARE: Patient's daughter is agreeable to SNF search and voices a feeling that she has needed a higher level for a while- CSW will proceed with SNF search and advise on offers as rec'd.       Timothy Trujillo, MSW, Timothy MajorsLCSWA [phone]

## 2014-06-08 NOTE — Clinical Social Work Psychosocial (Signed)
Clinical Social Work Department BRIEF PSYCHOSOCIAL ASSESSMENT 06/08/2014  Patient:  Timothy Trujillo, Timothy Trujillo     Account Number:  [id number]     Admit date:  06/04/2014  Clinical Social Worker:  Wyatt Haste  Date/Time:  06/08/2014 10:14 AM  Referred by:  CSW  Date Referred:  06/08/2014 Referred for  SNF Placement   Other Referral:   Interview type:  Patient Other interview type:    PSYCHOSOCIAL DATA Living Status:  ALONE Admitted from facility:   Level of care:   Primary support name:  Timothy Trujillo Primary support relationship to patient:  FAMILY Degree of support available:   supportive per pt    CURRENT CONCERNS Current Concerns  Post-Acute Placement   Other Concerns:    SOCIAL WORK ASSESSMENT / PLAN CSW met with pt at bedside. Pt alert and oriented and reports he lives alone. He describes his best support as his grandson, Timothy Trujillo. Timothy Trujillo is still in school, but generally provides transportation for pt and spends the weekends with him. Pt came to ED with shortness of breath. Admitted with acute on chronic respiratory failure. Pt states at baseline, he does not do much, but is able to at least get up and get to the bathroom with a walker. He admits to feeling much weaker and agrees with therapy that it would be best to go to SNF for rehab prior to returning home. Pt has been to [facility] and [facility] in the past. He said that his experience at [facility] was positive and he was much improved by the time he was d/c. SNF list provided. Pt is aware of [employer] authorization process. He states he would prefer either  or [facility].   Assessment/plan status:  Psychosocial Support/Ongoing Assessment of Needs Other assessment/ plan:   Information/referral to community resources:   SNF list    PATIENT'S/FAMILY'S RESPONSE TO PLAN OF CARE: Pt open to short term SNF at d/c. CSW will initiate bed search and insurance authorization.       Timothy Trujillo, [place]

## 2014-06-09 LAB — BASIC METABOLIC PANEL
Anion gap: 10 (ref 5–15)
BUN: 48 mg/dL — AB (ref 6–23)
CHLORIDE: 95 mmol/L — AB (ref 96–112)
CO2: 32 mmol/L (ref 19–32)
CREATININE: 1.3 mg/dL (ref 0.50–1.35)
Calcium: 9.8 mg/dL (ref 8.4–10.5)
GFR calc Af Amer: 65 mL/min — ABNORMAL LOW (ref >90)
GFR calc non Af Amer: 56 mL/min — ABNORMAL LOW (ref >90)
GLUCOSE: 156 mg/dL — AB (ref 70–99)
Potassium: 4.7 mmol/L (ref 3.5–5.1)
Sodium: 137 mmol/L (ref 135–145)

## 2014-06-09 LAB — GLUCOSE, CAPILLARY
GLUCOSE-CAPILLARY: 187 mg/dL — AB (ref 70–99)
GLUCOSE-CAPILLARY: 143 mg/dL — AB (ref 70–99)
Glucose-Capillary: 188 mg/dL — ABNORMAL HIGH (ref 70–99)
Glucose-Capillary: 174 mg/dL — ABNORMAL HIGH (ref 70–99)

## 2014-06-09 MED ORDER — PREDNISONE 10 MG PO TABS: 10.0000 mg | ORAL_TABLET | Freq: Every day | ORAL | Status: DC

## 2014-06-09 MED ORDER — BUDESONIDE 0.25 MG/2ML IN SUSP: 0.2500 mg | Freq: Two times a day (BID) | RESPIRATORY_TRACT | Status: AC

## 2014-06-09 MED ORDER — INSULIN ASPART 100 UNIT/ML ~~LOC~~ SOLN: 0.0000 [IU] | Freq: Three times a day (TID) | SUBCUTANEOUS | Status: DC

## 2014-06-09 MED ORDER — INSULIN NPH (HUMAN) (ISOPHANE) 100 UNIT/ML ~~LOC~~ SUSP: 20.0000 [IU] | Freq: Every day | SUBCUTANEOUS | Status: DC

## 2014-06-09 MED ORDER — TORSEMIDE 20 MG PO TABS: 20.0000 mg | ORAL_TABLET | Freq: Two times a day (BID) | ORAL | Status: AC

## 2014-06-09 MED ORDER — LEVALBUTEROL HCL 0.63 MG/3ML IN NEBU: 0.6300 mg | INHALATION_SOLUTION | Freq: Four times a day (QID) | RESPIRATORY_TRACT | Status: AC | PRN

## 2014-06-09 MED ORDER — ATORVASTATIN CALCIUM 80 MG PO TABS: 80.0000 mg | ORAL_TABLET | Freq: Every day | ORAL | Status: AC

## 2014-06-09 MED ORDER — INSULIN NPH (HUMAN) (ISOPHANE) 100 UNIT/ML ~~LOC~~ SUSP: 40.0000 [IU] | Freq: Every day | SUBCUTANEOUS | Status: AC

## 2014-06-09 MED ORDER — ONDANSETRON HCL 4 MG PO TABS: 4.0000 mg | ORAL_TABLET | Freq: Four times a day (QID) | ORAL | Status: AC | PRN

## 2014-06-09 MED ORDER — INSULIN ASPART 100 UNIT/ML ~~LOC~~ SOLN: 0.0000 [IU] | Freq: Every day | SUBCUTANEOUS | Status: DC

## 2014-06-09 MED ORDER — POTASSIUM CHLORIDE CRYS ER 20 MEQ PO TBCR: 20.0000 meq | EXTENDED_RELEASE_TABLET | Freq: Two times a day (BID) | ORAL | Status: AC

## 2014-06-09 MED ORDER — ACETAMINOPHEN 325 MG PO TABS: 650.0000 mg | ORAL_TABLET | Freq: Four times a day (QID) | ORAL | Status: AC | PRN

## 2014-06-09 MED ORDER — POLYETHYLENE GLYCOL 3350 17 G PO PACK: 17.0000 g | PACK | Freq: Every day | ORAL | Status: AC

## 2014-06-09 MED ORDER — ENSURE COMPLETE PO LIQD: 237.0000 mL | Freq: Two times a day (BID) | ORAL | Status: AC

## 2014-06-09 MED ORDER — DIGOXIN 250 MCG PO TABS: 0.2500 mg | ORAL_TABLET | Freq: Every day | ORAL | Status: AC

## 2014-06-09 NOTE — Progress Notes (Signed)
[facility]

## 2014-06-09 NOTE — Progress Notes (Signed)
Consulting cardiologist: Prentice Docker MD Primary Cardiologist: Dina Rich MD  Cardiology Specific Problem List: 1. End Stage Cardiomyopathy 2. Systolic CHF 3. Demand ischemia   Subjective:    Feeling so much better.   Objective:   Temp:  [97.7 F (36.5 C)-98.2 F (36.8 C)] 97.8 F (36.6 C) (03/03 0551) Pulse Rate:  [78-93] 78 (03/03 0551) Resp:  [18] 18 (03/03 0551) BP: (103-116)/(53-72) 103/53 mmHg (03/03 0551) SpO2:  [97 %-100 %] 99 % (03/03 0745) Last BM Date: July 18, 2014  Filed Weights   06/06/14 0602 06/07/14 0606 06/08/14 0500  Weight: 103.647 kg (228 lb 8 oz) 103.375 kg (227 lb 14.4 oz) 102.331 kg (225 lb 9.6 oz)    Intake/Output Summary (Last 24 hours) at 06/09/14 0820 Last data filed at 06/09/14 1610  Gross per 24 hour  Intake    480 ml  Output   1480 ml  Net  -1000 ml    Telemetry: NSR with rate of 70's.   Exam:  General: No acute distress.  HEENT: Conjunctiva and lids normal, oropharynx clear.  Lungs: Clear to auscultation, nonlabored.  Cardiac: No elevated JVP or bruits. RRR, no gallop or rub.   Abdomen: Normoactive bowel sounds, nontender, nondistended.  Extremities: No pitting edema, distal pulses full.  Neuropsychiatric: Alert and oriented x3, affect appropriate.   Lab Results:  Basic Metabolic Panel:  Recent Labs Lab 06/07/14 0625 06/08/14 1044 06/09/14 0601  NA 140 134* 137  K 4.0 4.8 4.7  CL 93* 93* 95*  CO2 34* 30 32  GLUCOSE 78 293* 156*  BUN 44* 43* 48*  CREATININE 1.17 1.25 1.30  CALCIUM 9.7 9.6 9.8    Liver Function Tests:  Recent Labs Lab 06/04/14 0325  AST 11  ALT 9  ALKPHOS 63  BILITOT 0.7  PROT 5.9*  ALBUMIN 3.0*    CBC:  Recent Labs Lab 06/04/14 0325 06/04/14 0848 06/05/14 0606  WBC 8.5 10.0 20.0*  HGB 10.1* 11.6* 12.1*  HCT 30.6* 35.1* 36.7*  MCV 88.7 89.1 89.1  PLT 200 205 258    Cardiac Enzymes:  Recent Labs Lab 06/05/14 0606 06/05/14 1146 06/05/14 1723  TROPONINI  0.34* 0.36* 0.39*    BNP:  Recent Labs  07/30/13 1523 02/25/14 2200  PROBNP 604.2* 1014.0*      Medications:   Scheduled Medications: . antiseptic oral rinse  7 mL Mouth Rinse BID  . aspirin  81 mg Oral Daily  . atorvastatin  80 mg Oral q1800  . budesonide (PULMICORT) nebulizer solution  0.25 mg Nebulization BID  . carvedilol  6.25 mg Oral BID  . clopidogrel  75 mg Oral Daily  . digoxin  0.25 mg Oral Daily  . enoxaparin (LOVENOX) injection  40 mg Subcutaneous Q24H  . feeding supplement (ENSURE COMPLETE)  237 mL Oral BID BM  . insulin aspart  0-15 Units Subcutaneous TID WC  . insulin aspart  0-5 Units Subcutaneous QHS  . insulin NPH Human  20 Units Subcutaneous QHS   And  . insulin NPH Human  40 Units Subcutaneous QAC breakfast  . ipratropium  0.5 mg Nebulization BID  . isosorbide mononitrate  60 mg Oral Daily  . levalbuterol  0.63 mg Nebulization BID  . polyethylene glycol  17 g Oral Daily  . potassium chloride  20 mEq Oral BID  . predniSONE  10 mg Oral Q breakfast  . sertraline  50 mg Oral Daily  . sodium chloride  3 mL Intravenous Q12H  . sodium chloride  3 mL Intravenous Q12H  . torsemide  20 mg Oral BID  . vitamin B-12  1,000 mcg Oral Daily  . vitamin E  400 Units Oral QHS    Infusions:    PRN Medications: acetaminophen **OR** acetaminophen, ALPRAZolam, diphenhydrAMINE, hydrocortisone cream, levalbuterol, nitroGLYCERIN, ondansetron **OR** ondansetron (ZOFRAN) IV, oxyCODONE-acetaminophen **AND** oxyCODONE   Assessment and Plan:   1. End Stage Cardiomyopathy: Heart rate improved with IV digoxin load, and now on maintenance of 0.25 mg daily. He is breathing better and feeling better. Plans to [facility]. Continue current medications. We have made follow up cardiology appt for June 30, 2014  2. CAD: Continue ASA, Plavix and statin.   3. Diabetes: Much better controlled by Dr. Juanetta GoslingHawkins  4. Severe deconditioning: Plans  for transfer to IP rehab. Bettey MareKathryn M. Lawrence NP [facility]  06/09/2014, 8:20 AM   The patient was seen and examined, and I agree with the assessment and plan as documented above. Feeling much better with improved HR control after IV digoxin loading. Continue Coreg, digoxin, and torsemide.  Plans for discharge today.

## 2014-06-09 NOTE — Progress Notes (Signed)
Report called to Harriette OharaBarbara Roberts at the [facility].

## 2014-06-09 NOTE — Clinical Social Work Note (Signed)
Pt d/c today to SNF. Pt accepts bed at [facility]. Authorization received and d/c summary faxed. Facility to provide transport this afternoon. Pt states he has already notified his family when CSW offered to call them.  Derenda FennelKara Tamicka Shimon, [place] [phone]

## 2014-06-09 NOTE — Progress Notes (Signed)
Subjective: He says he feels better. He has less shortness of breath. His blood sugar has stabilized to some extent.  Objective: Vital signs in last 24 hours: Temp:  [97.7 F (36.5 C)-98.2 F (36.8 C)] 97.8 F (36.6 C) (03/03 0551) Pulse Rate:  [78-93] 78 (03/03 0551) Resp:  [18] 18 (03/03 0551) BP: (103-116)/(53-72) 103/53 mmHg (03/03 0551) SpO2:  [97 %-100 %] 99 % (03/03 0745) Weight change:  Last BM Date: 07/07/2014  Intake/Output from previous day: 03/02 0701 - 03/03 0700 In: 720 [P.O.:720] Out: 1480 [Urine:1480]  PHYSICAL EXAM General appearance: alert, cooperative and no distress Resp: clear to auscultation bilaterally Cardio: regular rate and rhythm, S1, S2 normal, no murmur, click, rub or gallop GI: soft, non-tender; bowel sounds normal; no masses,  no organomegaly Extremities: extremities normal, atraumatic, no cyanosis or edema  Lab Results:  Results for orders placed or performed during the hospital encounter of 06/04/14 (from the past 48 hour(s))  Glucose, capillary     Status: Abnormal   Collection Time: 06/07/14 11:31 AM  Result Value Ref Range   Glucose-Capillary 210 (H) 70 - 99 mg/dL   Comment 1 Notify RN   Glucose, capillary     Status: Abnormal   Collection Time: 06/07/14  4:43 PM  Result Value Ref Range   Glucose-Capillary 297 (H) 70 - 99 mg/dL   Comment 1 Notify RN   Glucose, capillary     Status: Abnormal   Collection Time: 06/07/14  8:45 PM  Result Value Ref Range   Glucose-Capillary 327 (H) 70 - 99 mg/dL   Comment 1 Notify RN   Glucose, capillary     Status: Abnormal   Collection Time: 06/08/14  7:39 AM  Result Value Ref Range   Glucose-Capillary 204 (H) 70 - 99 mg/dL   Comment 1 Notify RN    Comment 2 Document in Chart   Basic metabolic panel     Status: Abnormal   Collection Time: 06/08/14 10:44 AM  Result Value Ref Range   Sodium 134 (L) 135 - 145 mmol/L   Potassium 4.8 3.5 - 5.1 mmol/L   Chloride 93 (L) 96 - 112 mmol/L   CO2 30 19 -  32 mmol/L   Glucose, Bld 293 (H) 70 - 99 mg/dL   BUN 43 (H) 6 - 23 mg/dL   Creatinine, Ser 1.25 0.50 - 1.35 mg/dL   Calcium 9.6 8.4 - 10.5 mg/dL   GFR calc non Af Amer 59 (L) >90 mL/min   GFR calc Af Amer 68 (L) >90 mL/min    Comment: (NOTE) The eGFR has been calculated using the CKD EPI equation. This calculation has not been validated in all clinical situations. eGFR's persistently <90 mL/min signify possible Chronic Kidney Disease.    Anion gap 11 5 - 15  Glucose, capillary     Status: Abnormal   Collection Time: 06/08/14 11:14 AM  Result Value Ref Range   Glucose-Capillary 282 (H) 70 - 99 mg/dL   Comment 1 Notify RN    Comment 2 Document in Chart   Glucose, capillary     Status: Abnormal   Collection Time: 06/08/14  4:42 PM  Result Value Ref Range   Glucose-Capillary 205 (H) 70 - 99 mg/dL   Comment 1 Notify RN    Comment 2 Document in Chart   Basic metabolic panel     Status: Abnormal   Collection Time: 06/09/14  6:01 AM  Result Value Ref Range   Sodium 137 135 - 145  mmol/L   Potassium 4.7 3.5 - 5.1 mmol/L   Chloride 95 (L) 96 - 112 mmol/L   CO2 32 19 - 32 mmol/L   Glucose, Bld 156 (H) 70 - 99 mg/dL   BUN 48 (H) 6 - 23 mg/dL   Creatinine, Ser 1.30 0.50 - 1.35 mg/dL   Calcium 9.8 8.4 - 10.5 mg/dL   GFR calc non Af Amer 56 (L) >90 mL/min   GFR calc Af Amer 65 (L) >90 mL/min    Comment: (NOTE) The eGFR has been calculated using the CKD EPI equation. This calculation has not been validated in all clinical situations. eGFR's persistently <90 mL/min signify possible Chronic Kidney Disease.    Anion gap 10 5 - 15  Glucose, capillary     Status: Abnormal   Collection Time: 06/09/14  7:22 AM  Result Value Ref Range   Glucose-Capillary 143 (H) 70 - 99 mg/dL   Comment 1 Notify RN    Comment 2 Document in Chart     ABGS No results for input(s): PHART, PO2ART, TCO2, HCO3 in the last 72 hours.  Invalid input(s): PCO2 CULTURES No results found for this or any previous  visit (from the past 240 hour(s)). Studies/Results: No results found.  Medications:  Prior to Admission:  Prescriptions prior to admission  Medication Sig Dispense Refill Last Dose  . ALPRAZolam (XANAX) 0.25 MG tablet Take 1 tablet (0.25 mg total) by mouth 2 (two) times daily as needed for anxiety. 60 tablet 5 Past Week at Unknown time  . aspirin 325 MG tablet Take 325 mg by mouth daily.   Past Week at Unknown time  . Aspirin-Salicylamide-Caffeine (BC FAST PAIN RELIEF ARTHRITIS) 767-209-47 MG PACK Take 1 packet by mouth 3 (three) times daily as needed (for pain).   Past Week at Unknown time  . aspirin-sod bicarb-citric acid (ALKA-SELTZER) 325 MG TBEF tablet Take 650 mg by mouth every 6 (six) hours as needed (pain).   unknown  . carvedilol (COREG) 6.25 MG tablet Take 1 tablet (6.25 mg total) by mouth 2 (two) times daily. 180 tablet 3 06/04/2014 at 1700  . clopidogrel (PLAVIX) 75 MG tablet Take 1 tablet (75 mg total) by mouth daily. 90 tablet 3 06/03/2014 at Unknown time  . diphenhydrAMINE (BENADRYL) 50 MG tablet Take 1 tablet (50 mg total) by mouth every 6 (six) hours as needed for itching. 120 tablet 5 unknown  . furosemide (LASIX) 80 MG tablet Take 80 mg by mouth 2 (two) times daily as needed for fluid.    06/03/2014 at Unknown time  . hydrocortisone 2.5 % cream Apply 1 application topically daily as needed (itching).    unknown  . insulin NPH Human (HUMULIN N,NOVOLIN N) 100 UNIT/ML injection Inject 0.75 mLs (75 Units total) into the skin 2 (two) times daily. (Patient taking differently: Inject 110 Units into the skin 2 (two) times daily. ) 10 mL 11 06/03/2014 at Unknown time  . isosorbide mononitrate (IMDUR) 60 MG 24 hr tablet Take 1 tablet (60 mg total) by mouth daily. 90 tablet 3 06/03/2014 at Unknown time  . nitroGLYCERIN (NITROSTAT) 0.4 MG SL tablet Place 1 tablet (0.4 mg total) under the tongue every 5 (five) minutes as needed for chest pain. 25 tablet 3 unknown  . oxyCODONE-acetaminophen  (PERCOCET) 10-325 MG per tablet Take 1 tablet by mouth every 4 (four) hours as needed for pain.   unknown  . sertraline (ZOLOFT) 50 MG tablet Take 1 tablet (50 mg total) by mouth daily. [place]  tablet 12 06/03/2014 at Unknown time  . vitamin B-12 (CYANOCOBALAMIN) 1000 MCG tablet Take 1,000 mcg by mouth daily.   06/03/2014 at Unknown time  . vitamin C (ASCORBIC ACID) 500 MG tablet Take 1,000 mg by mouth at bedtime.   06/03/2014 at Unknown time  . vitamin E 400 UNIT capsule Take 400 Units by mouth at bedtime.    06/03/2014 at Unknown time   Scheduled: . antiseptic oral rinse  7 mL Mouth Rinse BID  . aspirin  81 mg Oral Daily  . atorvastatin  80 mg Oral q1800  . budesonide (PULMICORT) nebulizer solution  0.25 mg Nebulization BID  . carvedilol  6.25 mg Oral BID  . clopidogrel  75 mg Oral Daily  . digoxin  0.25 mg Oral Daily  . enoxaparin (LOVENOX) injection  40 mg Subcutaneous Q24H  . feeding supplement (ENSURE COMPLETE)  237 mL Oral BID BM  . insulin aspart  0-15 Units Subcutaneous TID WC  . insulin aspart  0-5 Units Subcutaneous QHS  . insulin NPH Human  20 Units Subcutaneous QHS   And  . insulin NPH Human  40 Units Subcutaneous QAC breakfast  . ipratropium  0.5 mg Nebulization BID  . isosorbide mononitrate  60 mg Oral Daily  . levalbuterol  0.63 mg Nebulization BID  . polyethylene glycol  17 g Oral Daily  . potassium chloride  20 mEq Oral BID  . predniSONE  10 mg Oral Q breakfast  . sertraline  50 mg Oral Daily  . sodium chloride  3 mL Intravenous Q12H  . sodium chloride  3 mL Intravenous Q12H  . torsemide  20 mg Oral BID  . vitamin B-12  1,000 mcg Oral Daily  . vitamin E  400 Units Oral QHS   Continuous:  IAX:[id number] **OR** acetaminophen, ALPRAZolam, diphenhydrAMINE, hydrocortisone cream, levalbuterol, nitroGLYCERIN, ondansetron **OR** ondansetron (ZOFRAN) IV, oxyCODONE-acetaminophen **AND** oxyCODONE  Assesment: He was admitted with acute on chronic systolic heart failure related  to ischemic cardiomyopathy. He has diuresed well and says he feels better. His heart rate had been up but that has improved. Blood sugar is improved. He is having less of the wild fluctuations. He is ready for transfer to skilled care facility Principal Problem:   Acute on chronic systolic heart failure Active Problems:   Silicosis   Ischemic cardiomyopathy   COPD (chronic obstructive pulmonary disease)   Acute on chronic respiratory failure   Acute systolic CHF (congestive heart failure)   Diabetes mellitus type 2, uncontrolled    Plan: Transfer to skilled care facility when bed available    LOS: 5 days   HAWKINS,EDWARD L 06/09/2014, 8:51 AM

## 2014-06-09 NOTE — Discharge Summary (Signed)
Physician Discharge Summary  Patient ID: Timothy Trujillo MRN: [id number] DOB/AGE: 1950-06-26 64 y.o. Primary Care Physician:Shirlean Berman L, MD Admit date: 06/04/2014 Discharge date: 06/09/2014    Discharge Diagnoses:   Principal Problem:   Acute on chronic systolic heart failure Active Problems:   Silicosis   Ischemic cardiomyopathy   COPD (chronic obstructive pulmonary disease)   Acute on chronic respiratory failure   Acute systolic CHF (congestive heart failure)   Diabetes mellitus type 2, uncontrolled     Medication List    STOP taking these medications        aspirin-sod bicarb-citric acid 325 MG Tbef tablet  Commonly known as:  ALKA-SELTZER     BC FAST PAIN RELIEF ARTHRITIS [phone] MG Pack  Generic drug:  Aspirin-Salicylamide-Caffeine     furosemide 80 MG tablet  Commonly known as:  LASIX      TAKE these medications        acetaminophen 325 MG tablet  Commonly known as:  TYLENOL  Take 2 tablets (650 mg total) by mouth every 6 (six) hours as needed for mild pain (or Fever >/= 101).     ALPRAZolam 0.25 MG tablet  Commonly known as:  XANAX  Take 1 tablet (0.25 mg total) by mouth 2 (two) times daily as needed for anxiety.     aspirin 325 MG tablet  Take 325 mg by mouth daily.     atorvastatin 80 MG tablet  Commonly known as:  LIPITOR  Take 1 tablet (80 mg total) by mouth daily.     budesonide 0.25 MG/2ML nebulizer solution  Commonly known as:  PULMICORT  Take 2 mLs (0.25 mg total) by nebulization 2 (two) times daily.     carvedilol 6.25 MG tablet  Commonly known as:  COREG  Take 1 tablet (6.25 mg total) by mouth 2 (two) times daily.     clopidogrel 75 MG tablet  Commonly known as:  PLAVIX  Take 1 tablet (75 mg total) by mouth daily.     digoxin 0.25 MG tablet  Commonly known as:  LANOXIN  Take 1 tablet (0.25 mg total) by mouth daily.     diphenhydrAMINE 50 MG tablet  Commonly known as:  BENADRYL  Take 1 tablet (50 mg total) by mouth every 6  (six) hours as needed for itching.     feeding supplement (ENSURE COMPLETE) Liqd  Take 237 mLs by mouth 2 (two) times daily between meals.     hydrocortisone 2.5 % cream  Apply 1 application topically daily as needed (itching).     insulin aspart 100 UNIT/ML injection  Commonly known as:  novoLOG  Inject 0-15 Units into the skin 3 (three) times daily with meals.     insulin aspart 100 UNIT/ML injection  Commonly known as:  novoLOG  Inject 0-5 Units into the skin at bedtime.     insulin NPH Human 100 UNIT/ML injection  Commonly known as:  HUMULIN N,NOVOLIN N  Inject 0.4 mLs (40 Units total) into the skin daily before breakfast.     insulin NPH Human 100 UNIT/ML injection  Commonly known as:  HUMULIN N,NOVOLIN N  Inject 0.2 mLs (20 Units total) into the skin at bedtime.     isosorbide mononitrate 60 MG 24 hr tablet  Commonly known as:  IMDUR  Take 1 tablet (60 mg total) by mouth daily.     levalbuterol 0.63 MG/3ML nebulizer solution  Commonly known as:  XOPENEX  Take 3 mLs (0.63 mg total) by nebulization every  6 (six) hours as needed for wheezing or shortness of breath.     nitroGLYCERIN 0.4 MG SL tablet  Commonly known as:  NITROSTAT  Place 1 tablet (0.4 mg total) under the tongue every 5 (five) minutes as needed for chest pain.     ondansetron 4 MG tablet  Commonly known as:  ZOFRAN  Take 1 tablet (4 mg total) by mouth every 6 (six) hours as needed for nausea.     oxyCODONE-acetaminophen 10-325 MG per tablet  Commonly known as:  PERCOCET  Take 1 tablet by mouth every 4 (four) hours as needed for pain.     polyethylene glycol packet  Commonly known as:  MIRALAX / GLYCOLAX  Take 17 g by mouth daily.     potassium chloride SA 20 MEQ tablet  Commonly known as:  K-DUR,KLOR-CON  Take 1 tablet (20 mEq total) by mouth 2 (two) times daily.     predniSONE 10 MG tablet  Commonly known as:  DELTASONE  Take 1 tablet (10 mg total) by mouth daily with breakfast.      sertraline 50 MG tablet  Commonly known as:  ZOLOFT  Take 1 tablet (50 mg total) by mouth daily.     torsemide 20 MG tablet  Commonly known as:  DEMADEX  Take 1 tablet (20 mg total) by mouth 2 (two) times daily.     vitamin B-12 1000 MCG tablet  Commonly known as:  CYANOCOBALAMIN  Take 1,000 mcg by mouth daily.     vitamin C 500 MG tablet  Commonly known as:  ASCORBIC ACID  Take 1,000 mg by mouth at bedtime.     vitamin E 400 UNIT capsule  Take 400 Units by mouth at bedtime.        Discharged Condition: Improved    Consults: Cardiology  Significant Diagnostic Studies: Dg Chest Portable 1 View  06/04/2014   CLINICAL DATA:  Shortness of breath and sharp constant chest pain. Symptoms for 1 day.  EXAM: PORTABLE CHEST - 1 VIEW  COMPARISON:  Radiographs and CT 05/07/2014  FINDINGS: Patient is post median sternotomy. Cardiomegaly is slightly decreased. Pulmonary edema has progressed from prior. Blunting of both costophrenic angles, suspect small pleural effusions. No pneumothorax. No confluent airspace disease.  IMPRESSION: Moderate CHF.   Electronically Signed   By: Rubye OaksMelanie  Ehinger M.D.   On: 06/04/2014 04:58    Lab Results: Basic Metabolic Panel:  Recent Labs  96/29/5201/05/24 1044 06/09/14 0601  NA 134* 137  K 4.8 4.7  CL 93* 95*  CO2 30 32  GLUCOSE 293* 156*  BUN 43* 48*  CREATININE 1.25 1.30  CALCIUM 9.6 9.8   Liver Function Tests: No results for input(s): AST, ALT, ALKPHOS, BILITOT, PROT, ALBUMIN in the last 72 hours.   CBC: No results for input(s): WBC, NEUTROABS, HGB, HCT, MCV, PLT in the last 72 hours.  No results found for this or any previous visit (from the past 240 hour(s)).   Hospital Course: This is a 64 year old he came to the hospital because of shortness of breath. He was found to have acute congestive heart failure. He was treated with torsemide had some problems with his blood sugar and adjustments were made in his insulin. Cardiology consultation was  obtained. His heart rate was elevated but improved over time. He is markedly improved but is still very weak and it is felt that he needs skilled care facility placement for rehabilitation.  Discharge Exam: Blood pressure 103/53, pulse 78, temperature 97.8 F (  36.6 C), temperature source Oral, resp. rate 18, height  (1.905 m), weight 102.331 kg (225 lb 9.6 oz), SpO2 99 %. He is awake and alert. His chest is clear. Heart is regular. Abdomen is soft  Disposition: To skilled care facility. He will be on a low-salt diet. He will be on oxygen at 2 L with adjustments to keep his O2 sat greater than 90%. He will need facility sliding scale insulin moderately sensitive. He will need daily weights. He will have PT OT and speech as needed.      Discharge Instructions    Discharge to SNF when bed available    Complete by:  As directed            Follow-up Information    Follow up with Joni Reining, NP On 06/30/2014.   Specialty:  Nurse Practitioner   Why:  1:10   Contact information:   [address] [place] [address] [phone]       Signed: Franciso Dierks L   06/09/2014, 8:58 AM

## 2014-06-09 NOTE — Progress Notes (Signed)
UR chart review completed.  

## 2014-06-09 NOTE — Clinical Social Work Placement (Signed)
Clinical Social Work Department CLINICAL SOCIAL WORK PLACEMENT NOTE 06/09/2014  Patient:  Timothy Trujillo,[name]  Account Number:  [id number] Admit date:  06/04/2014  Clinical Social Worker:  Derenda FennelKARA Cleston Lautner, LCSW  Date/time:  06/08/2014 10:12 AM  Clinical Social Work is seeking post-discharge placement for this patient at the following level of care:   SKILLED NURSING   (*CSW will update this form in Epic as items are completed)   06/08/2014  [facility]family provided with [facility] of Clinical Social Work's list of facilities offering this level of care within the geographic area requested by the patient (or if unable, by the patient's family).  06/08/2014  Patient/family informed of their freedom to choose among providers that offer the needed level of care, that participate in Medicare, Medicaid or managed care program needed by the patient, have an available bed and are willing to accept the patient.  06/08/2014  Patient/family informed of [facility]' ownership interest in [facility], as well as of the fact that they are under no obligation to receive care at this facility.  PASARR submitted to EDS on  PASARR number received on   FL2 transmitted to all facilities in geographic area requested by pt/family on  06/08/2014 FL2 transmitted to all facilities within larger geographic area on   Patient informed that his/her managed care company has contracts with or will negotiate with  certain facilities, including the following:     Patient/family informed of bed offers received:  06/09/2014 [facility] Physician recommends and patient chooses bed at    [facility]  06/09/2014 Patient to be transferred to facility by facility [facility] Patient and family notified of transfer on 06/09/2014 Name of family member notified:  pt states he has already notified family  The following physician request were  entered in Epic:   Additional Comments: Pt has existing pasarr.  Derenda FennelKara Mitsugi Schrader, [place] [phone]

## 2014-06-23 ENCOUNTER — Encounter (HOSPITAL_COMMUNITY): Payer: Self-pay | Admitting: Emergency Medicine

## 2014-06-23 ENCOUNTER — Other Ambulatory Visit (HOSPITAL_COMMUNITY): Payer: Self-pay

## 2014-06-23 ENCOUNTER — Emergency Department (HOSPITAL_COMMUNITY): Payer: Commercial Managed Care - HMO

## 2014-06-23 ENCOUNTER — Inpatient Hospital Stay (HOSPITAL_COMMUNITY): Payer: Commercial Managed Care - HMO

## 2014-06-23 ENCOUNTER — Inpatient Hospital Stay (HOSPITAL_COMMUNITY)
Admission: EM | Admit: 2014-06-23 | Discharge: 2014-06-27 | DRG: 480 | Disposition: A | Discharge status: Home Health | Payer: Commercial Managed Care - HMO | Attending: Internal Medicine | Admitting: Internal Medicine

## 2014-06-23 DIAGNOSIS — Z7982 Long term (current) use of aspirin: Secondary | ICD-10-CM | POA: Diagnosis not present

## 2014-06-23 DIAGNOSIS — E1165 Type 2 diabetes mellitus with hyperglycemia: Secondary | ICD-10-CM | POA: Diagnosis present

## 2014-06-23 DIAGNOSIS — Z6828 Body mass index (BMI) 28.0-28.9, adult: Secondary | ICD-10-CM

## 2014-06-23 DIAGNOSIS — K5900 Constipation, unspecified: Secondary | ICD-10-CM | POA: Diagnosis present

## 2014-06-23 DIAGNOSIS — J45909 Unspecified asthma, uncomplicated: Secondary | ICD-10-CM | POA: Diagnosis present

## 2014-06-23 DIAGNOSIS — J449 Chronic obstructive pulmonary disease, unspecified: Secondary | ICD-10-CM | POA: Diagnosis present

## 2014-06-23 DIAGNOSIS — Z8261 Family history of arthritis: Secondary | ICD-10-CM | POA: Diagnosis not present

## 2014-06-23 DIAGNOSIS — Z7952 Long term (current) use of systemic steroids: Secondary | ICD-10-CM | POA: Diagnosis not present

## 2014-06-23 DIAGNOSIS — Z791 Long term (current) use of non-steroidal anti-inflammatories (NSAID): Secondary | ICD-10-CM

## 2014-06-23 DIAGNOSIS — I2510 Atherosclerotic heart disease of native coronary artery without angina pectoris: Secondary | ICD-10-CM | POA: Diagnosis present

## 2014-06-23 DIAGNOSIS — M25552 Pain in left hip: Secondary | ICD-10-CM | POA: Diagnosis present

## 2014-06-23 DIAGNOSIS — I482 Chronic atrial fibrillation: Secondary | ICD-10-CM | POA: Diagnosis present

## 2014-06-23 DIAGNOSIS — Z7902 Long term (current) use of antithrombotics/antiplatelets: Secondary | ICD-10-CM | POA: Diagnosis not present

## 2014-06-23 DIAGNOSIS — I5022 Chronic systolic (congestive) heart failure: Secondary | ICD-10-CM | POA: Diagnosis present

## 2014-06-23 DIAGNOSIS — E119 Type 2 diabetes mellitus without complications: Secondary | ICD-10-CM | POA: Diagnosis not present

## 2014-06-23 DIAGNOSIS — Z01810 Encounter for preprocedural cardiovascular examination: Secondary | ICD-10-CM

## 2014-06-23 DIAGNOSIS — J9610 Chronic respiratory failure, unspecified whether with hypoxia or hypercapnia: Secondary | ICD-10-CM | POA: Diagnosis present

## 2014-06-23 DIAGNOSIS — I10 Essential (primary) hypertension: Secondary | ICD-10-CM | POA: Diagnosis present

## 2014-06-23 DIAGNOSIS — E785 Hyperlipidemia, unspecified: Secondary | ICD-10-CM | POA: Diagnosis present

## 2014-06-23 DIAGNOSIS — S72009A Fracture of unspecified part of neck of unspecified femur, initial encounter for closed fracture: Secondary | ICD-10-CM | POA: Diagnosis present

## 2014-06-23 DIAGNOSIS — Z8673 Personal history of transient ischemic attack (TIA), and cerebral infarction without residual deficits: Secondary | ICD-10-CM | POA: Diagnosis not present

## 2014-06-23 DIAGNOSIS — I255 Ischemic cardiomyopathy: Secondary | ICD-10-CM | POA: Diagnosis present

## 2014-06-23 DIAGNOSIS — Z794 Long term (current) use of insulin: Secondary | ICD-10-CM | POA: Diagnosis not present

## 2014-06-23 DIAGNOSIS — D62 Acute posthemorrhagic anemia: Secondary | ICD-10-CM | POA: Diagnosis not present

## 2014-06-23 DIAGNOSIS — M1990 Unspecified osteoarthritis, unspecified site: Secondary | ICD-10-CM | POA: Diagnosis present

## 2014-06-23 DIAGNOSIS — S72002A Fracture of unspecified part of neck of left femur, initial encounter for closed fracture: Secondary | ICD-10-CM | POA: Diagnosis present

## 2014-06-23 DIAGNOSIS — S72001A Fracture of unspecified part of neck of right femur, initial encounter for closed fracture: Secondary | ICD-10-CM

## 2014-06-23 DIAGNOSIS — Y92091 Bathroom in other non-institutional residence as the place of occurrence of the external cause: Secondary | ICD-10-CM | POA: Diagnosis not present

## 2014-06-23 DIAGNOSIS — E43 Unspecified severe protein-calorie malnutrition: Secondary | ICD-10-CM | POA: Insufficient documentation

## 2014-06-23 DIAGNOSIS — Z8781 Personal history of (healed) traumatic fracture: Secondary | ICD-10-CM

## 2014-06-23 DIAGNOSIS — Z955 Presence of coronary angioplasty implant and graft: Secondary | ICD-10-CM

## 2014-06-23 DIAGNOSIS — Z9889 Other specified postprocedural states: Secondary | ICD-10-CM

## 2014-06-23 DIAGNOSIS — Z8249 Family history of ischemic heart disease and other diseases of the circulatory system: Secondary | ICD-10-CM

## 2014-06-23 DIAGNOSIS — Z66 Do not resuscitate: Secondary | ICD-10-CM | POA: Diagnosis present

## 2014-06-23 DIAGNOSIS — W19XXXA Unspecified fall, initial encounter: Secondary | ICD-10-CM | POA: Diagnosis present

## 2014-06-23 DIAGNOSIS — Y9301 Activity, walking, marching and hiking: Secondary | ICD-10-CM | POA: Diagnosis not present

## 2014-06-23 DIAGNOSIS — Z9981 Dependence on supplemental oxygen: Secondary | ICD-10-CM | POA: Diagnosis not present

## 2014-06-23 DIAGNOSIS — Z87891 Personal history of nicotine dependence: Secondary | ICD-10-CM

## 2014-06-23 DIAGNOSIS — T148XXA Other injury of unspecified body region, initial encounter: Secondary | ICD-10-CM

## 2014-06-23 DIAGNOSIS — Z951 Presence of aortocoronary bypass graft: Secondary | ICD-10-CM | POA: Diagnosis not present

## 2014-06-23 LAB — CBC WITH DIFFERENTIAL/PLATELET
BASOS ABS: 0 10*3/uL (ref 0.0–0.1)
BASOS PCT: 0 % (ref 0–1)
Eosinophils Absolute: 0.3 10*3/uL (ref 0.0–0.7)
Eosinophils Relative: 3 % (ref 0–5)
HCT: 31.8 % — ABNORMAL LOW (ref 39.0–52.0)
HEMOGLOBIN: 10.7 g/dL — AB (ref 13.0–17.0)
Lymphocytes Relative: 13 % (ref 12–46)
Lymphs Abs: 1.2 10*3/uL (ref 0.7–4.0)
MCH: 28.8 pg (ref 26.0–34.0)
MCHC: 33.6 g/dL (ref 30.0–36.0)
MCV: 85.5 fL (ref 78.0–100.0)
MONO ABS: 0.7 10*3/uL (ref 0.1–1.0)
Monocytes Relative: 7 % (ref 3–12)
NEUTROS ABS: 7.5 10*3/uL (ref 1.7–7.7)
Neutrophils Relative %: 77 % (ref 43–77)
Platelets: 182 10*3/uL (ref 150–400)
RBC: 3.72 MIL/uL — ABNORMAL LOW (ref 4.22–5.81)
RDW: 13.9 % (ref 11.5–15.5)
WBC: 9.6 10*3/uL (ref 4.0–10.5)

## 2014-06-23 LAB — COMPREHENSIVE METABOLIC PANEL
ALBUMIN: 2.9 g/dL — AB (ref 3.5–5.2)
ALK PHOS: 64 U/L (ref 39–117)
ALT: 10 U/L (ref 0–53)
ANION GAP: 9 (ref 5–15)
AST: 15 U/L (ref 0–37)
BILIRUBIN TOTAL: 0.6 mg/dL (ref 0.3–1.2)
BUN: 14 mg/dL (ref 6–23)
CO2: 28 mmol/L (ref 19–32)
Calcium: 8.8 mg/dL (ref 8.4–10.5)
Chloride: 101 mmol/L (ref 96–112)
Creatinine, Ser: 0.91 mg/dL (ref 0.50–1.35)
GFR calc Af Amer: >90 mL/min (ref >90)
GFR calc non Af Amer: 87 mL/min — ABNORMAL LOW (ref >90)
Glucose, Bld: 208 mg/dL — ABNORMAL HIGH (ref 70–99)
Potassium: 4.2 mmol/L (ref 3.5–5.1)
Sodium: 138 mmol/L (ref 135–145)
TOTAL PROTEIN: 5.9 g/dL — AB (ref 6.0–8.3)

## 2014-06-23 MED ORDER — FENTANYL CITRATE 0.05 MG/ML IJ SOLN
100.0000 ug | Freq: Once | INTRAMUSCULAR | Status: AC
  Administered 2014-06-23: 100 ug via INTRAVENOUS
  Filled 2014-06-23: qty 2

## 2014-06-23 NOTE — ED Provider Notes (Signed)
  Face-to-face evaluation   History: He fell several days ago, and is having persistent left chest, and left hip pain. He is unable and weight secondary to pain.  Physical exam: Alert, elderly man who appears debilitated and older than his stated age. Chest with left anterior chest wall tenderness, but no crepitation. Left hip tender, and the left leg is shortened.  Medical screening examination/treatment/procedure(s) were conducted as a shared visit with non-physician practitioner(s) and myself.  I personally evaluated the patient during the encounter  Mancel BaleElliott Linell Meldrum, MD 06/25/14 [phone]

## 2014-06-23 NOTE — ED Notes (Signed)
Per EMS: fell late Monday night going to bathroom, possible syncopal episode at that time.  Had left hip and flank pain and not being able to get in and out of bed since that time, has been using urinal.  Guarding left hip.  Was given 100 fentanyl in route, states it hurts to breath.  CBG 324.  12 lead ok.  Extensive hx, cabg, mi, hyperglycemia, chf.  Lung sounds clear, bruise to left hip.

## 2014-06-23 NOTE — ED Notes (Signed)
Timothy Upstill, NP at bedside 

## 2014-06-23 NOTE — ED Notes (Signed)
Patient transported to CT 

## 2014-06-23 NOTE — ED Provider Notes (Signed)
CSN: [id number]     Arrival date & time 06/23/14  2056 History   First MD Initiated Contact with Patient 06/23/14 2116     Chief Complaint  Patient presents with  . Fall  . Flank Pain     (Consider location/radiation/quality/duration/timing/severity/associated sxs/prior Treatment) Patient is a 64 y.o. male presenting with fall and flank pain. The history is provided by the patient. No language interpreter was used.  Fall This is a new problem. The current episode started in the past 7 days. Pertinent negatives include no abdominal pain, chest pain, chills, coughing, fever, headaches, nausea, neck pain or vomiting. Associated symptoms comments: He presents with complaint of fall 2-3 days ago. He states he was walking to the bathroom and woke up on the floor with pain in the left hip and left lower lateral chest. He states it hurts to breathe. Since the fall he has been transferring to and from his wheelchair with difficulty and since yesterday has been unable to get up from the bed without help. No nausea or vomiting. He denies chest pain. He reports a recent hospitalization for CHF and 24 hours use of oxygen at 4L at home..  Flank Pain Pertinent negatives include no abdominal pain, chest pain, chills, coughing, fever, headaches, nausea, neck pain or vomiting.    Past Medical History  Diagnosis Date  . Uncontrolled diabetes mellitus     a. A1C 12.8 in 01/2013.  [name] Chronic systolic heart failure   . Obesity   . Hyperlipidemia   . Peripheral vascular disease   . Hypertension   . Asthma   . CAD (coronary artery disease)     a. Prior CABG 20 yrs ago (?~1994). b. Hx of multiple stents, prev followed in [place]. c. [place] 01/2013: cath moderate disease in LAD, distal LCx, prox RCA occluded, 2 SVGs occluded, felt to be stable from prior cath -> for medical therapy.  . Ischemic cardiomyopathy     a. EF 2012: 55-60. b. EF 12/2011: 25-30%. c. Echo 01/2013: EF 15-20%.  . Stroke     15 years ago  .  Arthritis   . Anxiety   . Depression   . 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 Chronic respiratory failure     a. Due to COPD.  . Osteomyelitis 2013    was planned for chronic suppressive antibiotics but cannot afford this any longer  . DNR no code (do not resuscitate) 07/2013  . CHF (congestive heart failure)   . Diabetes mellitus without complication    Past Surgical History  Procedure Laterality Date  . Coronary artery bypass graft    . Coronary stent placement  2008    CABG grafts closed  . Joint replacement    . Cholecystectomy    . Eye surgery    . I&d extremity  06/12/2011    Procedure: IRRIGATION AND DEBRIDEMENT EXTREMITY;  Surgeon: Javier Docker, MD;  Location: [facility] OR;  Service: Orthopedics;  Laterality: Right;  I&D Right [place]  . Left and right heart catheterization with coronary/graft angiogram N/A 01/15/2013    Procedure: LEFT AND RIGHT HEART CATHETERIZATION WITH Isabel Caprice;  Surgeon: Alvia Grove, MD;  Location: [facility];  Service: Cardiovascular;  Laterality: N/A;   Family History  Problem Relation Age of Onset  . Dementia Mother   . Arthritis Mother   . Heart disease Father   . Heart disease Brother  History  Substance Use Topics  . Smoking status: Former Smoker -- 3.00 packs/day for 26 years    Start date: 04/08/1957    Quit date: 04/09/1983  . Smokeless tobacco: Never Used  . Alcohol Use: No    Review of Systems  Constitutional: Positive for activity change. Negative for fever and chills.  HENT: Negative.   Eyes: Negative.  Negative for visual disturbance.  Respiratory: Negative for cough.        See HPI.  Cardiovascular: Negative for chest pain.  Gastrointestinal: Negative for nausea, vomiting and abdominal pain.  Genitourinary: Positive for flank pain.  Musculoskeletal: Negative for neck pain.       See  Skin: Negative.  Negative for wound.  Neurological: Positive for  syncope. Negative for headaches.       Questionable syncope.  Psychiatric/Behavioral: Negative for confusion.      Allergies  Baycol and Stadol  Home Medications   Prior to Admission medications   Medication Sig Start Date End Date Taking? Authorizing Provider  acetaminophen (TYLENOL) 325 MG tablet Take 2 tablets (650 mg total) by mouth every 6 (six) hours as needed for mild pain (or Fever >/= 101). 06/09/14   Kari Baars, MD  ALPRAZolam Prudy Feeler) 0.25 MG tablet Take 1 tablet (0.25 mg total) by mouth 2 (two) times daily as needed for anxiety. 05/10/14   Kari Baars, MD  aspirin 325 MG tablet Take 325 mg by mouth daily.    Historical Provider, MD  atorvastatin (LIPITOR) 80 MG tablet Take 1 tablet (80 mg total) by mouth daily. 06/09/14   Kari Baars, MD  budesonide (PULMICORT) 0.25 MG/2ML nebulizer solution Take 2 mLs (0.25 mg total) by nebulization 2 (two) times daily. 06/09/14   Kari Baars, MD  carvedilol (COREG) 6.25 MG tablet Take 1 tablet (6.25 mg total) by mouth 2 (two) times daily. 04/25/14   Jodelle Gross, NP  clopidogrel (PLAVIX) 75 MG tablet Take 1 tablet (75 mg total) by mouth daily. 04/25/14   Jodelle Gross, NP  digoxin (LANOXIN) 0.25 MG tablet Take 1 tablet (0.25 mg total) by mouth daily. 06/09/14   Kari Baars, MD  diphenhydrAMINE (BENADRYL) 50 MG tablet Take 1 tablet (50 mg total) by mouth every 6 (six) hours as needed for itching. 05/10/14   Kari Baars, MD  feeding supplement, ENSURE COMPLETE, (ENSURE COMPLETE) LIQD Take 237 mLs by mouth 2 (two) times daily between meals. 06/09/14   Kari Baars, MD  hydrocortisone 2.5 % cream Apply 1 application topically daily as needed (itching).  04/25/14   Historical Provider, MD  insulin aspart (NOVOLOG) 100 UNIT/ML injection Inject 0-15 Units into the skin 3 (three) times daily with meals. 06/09/14   Kari Baars, MD  insulin aspart (NOVOLOG) 100 UNIT/ML injection Inject 0-5 Units into the skin at bedtime. 06/09/14   Kari Baars, MD  insulin NPH Human (HUMULIN N,NOVOLIN N) 100 UNIT/ML injection Inject 0.4 mLs (40 Units total) into the skin daily before breakfast. 06/09/14   Kari Baars, MD  insulin NPH Human (HUMULIN N,NOVOLIN N) 100 UNIT/ML injection Inject 0.2 mLs (20 Units total) into the skin at bedtime. 06/09/14   Kari Baars, MD  isosorbide mononitrate (IMDUR) 60 MG 24 hr tablet Take 1 tablet (60 mg total) by mouth daily. 04/25/14   Jodelle Gross, NP  levalbuterol Pauline Aus) 0.63 MG/3ML nebulizer solution Take 3 mLs (0.63 mg total) by nebulization every 6 (six) hours as needed for wheezing or shortness of breath. 06/09/14   Kari Baars,  MD  nitroGLYCERIN (NITROSTAT) 0.4 MG SL tablet Place 1 tablet (0.4 mg total) under the tongue every 5 (five) minutes as needed for chest pain. 04/25/14   Jodelle GrossKathryn M Lawrence, NP  ondansetron (ZOFRAN) 4 MG tablet Take 1 tablet (4 mg total) by mouth every 6 (six) hours as needed for nausea. 06/09/14   Kari BaarsEdward Hawkins, MD  oxyCODONE-acetaminophen (PERCOCET) 10-325 MG per tablet Take 1 tablet by mouth every 4 (four) hours as needed for pain.    Historical Provider, MD  polyethylene glycol (MIRALAX / GLYCOLAX) packet Take 17 g by mouth daily. 06/09/14   Kari BaarsEdward Hawkins, MD  potassium chloride SA (K-DUR,KLOR-CON) 20 MEQ tablet Take 1 tablet (20 mEq total) by mouth 2 (two) times daily. 06/09/14   Kari BaarsEdward Hawkins, MD  predniSONE (DELTASONE) 10 MG tablet Take 1 tablet (10 mg total) by mouth daily with breakfast. 06/09/14   Kari BaarsEdward Hawkins, MD  sertraline (ZOLOFT) 50 MG tablet Take 1 tablet (50 mg total) by mouth daily. 05/10/14   Kari BaarsEdward Hawkins, MD  torsemide (DEMADEX) 20 MG tablet Take 1 tablet (20 mg total) by mouth 2 (two) times daily. 06/09/14   Kari BaarsEdward Hawkins, MD  vitamin B-12 (CYANOCOBALAMIN) 1000 MCG tablet Take 1,000 mcg by mouth daily.    Historical Provider, MD  vitamin C (ASCORBIC ACID) 500 MG tablet Take 1,000 mg by mouth at bedtime.    Historical Provider, MD  vitamin E 400 UNIT capsule  Take 400 Units by mouth at bedtime.     Historical Provider, MD   BP 111/71 mmHg  Pulse 107  Temp(Src) 97.7 F (36.5 C) (Oral)  Resp 19  Ht 6\' 3"  (1.905 m)  Wt 230 lb (104.327 kg)  BMI 28.75 kg/m2  SpO2 97% Physical Exam  Constitutional: He is oriented to person, place, and time. He appears well-developed and well-nourished.  Chronically ill appearing patient in NAD.  HENT:  Head: Normocephalic and atraumatic.  Eyes: Conjunctivae are normal.  Neck: Normal range of motion. Neck supple.  Cardiovascular: Regular rhythm.  Tachycardia present.   No murmur heard. Pulmonary/Chest: Effort normal. He has rales. He exhibits tenderness.  Normal respiratory effort on 4L O2 via [place]. Tender left lower chest wall. Atraumatic in appearance.  Abdominal: Soft. There is no tenderness. There is no rebound.  Musculoskeletal: He exhibits tenderness. He exhibits no edema.  Tender over left hip with deformity. The left LE is not externally rotated or obviously shortened.   Neurological: He is alert and oriented to person, place, and time.  Skin: Skin is warm and dry.    ED Course  Procedures (including critical care time) Labs Review Labs Reviewed  CBC WITH DIFFERENTIAL/PLATELET  COMPREHENSIVE METABOLIC PANEL  URINALYSIS, ROUTINE W REFLEX MICROSCOPIC    Imaging Review No results found.   EKG Interpretation None      MDM   Final diagnoses:  Fall    1. Left femoral neck fracture 2. Severe COPD  The patient requires 4L O2 to maintain normal saturations. He remains alert and oriented. Persistently tachycardic.  Pain controlled with IV Fentanyl. Femoral neck fracture found on imaging. Discussed with [facility] who accepts for admission. Discussed with Dr. Roda ShuttersXu (ortho) who will consult on the patient for fracture.     Elpidio AnisShari Aleenah Homen, PA-C 06/25/14 0020  Mancel BaleElliott Wentz, MD 06/25/14 [phone]

## 2014-06-24 ENCOUNTER — Inpatient Hospital Stay (HOSPITAL_COMMUNITY): Payer: Commercial Managed Care - HMO

## 2014-06-24 ENCOUNTER — Encounter (HOSPITAL_COMMUNITY): Payer: Self-pay | Admitting: Internal Medicine

## 2014-06-24 DIAGNOSIS — E119 Type 2 diabetes mellitus without complications: Secondary | ICD-10-CM

## 2014-06-24 DIAGNOSIS — E43 Unspecified severe protein-calorie malnutrition: Secondary | ICD-10-CM | POA: Insufficient documentation

## 2014-06-24 DIAGNOSIS — I255 Ischemic cardiomyopathy: Secondary | ICD-10-CM

## 2014-06-24 DIAGNOSIS — Z01810 Encounter for preprocedural cardiovascular examination: Secondary | ICD-10-CM

## 2014-06-24 DIAGNOSIS — J449 Chronic obstructive pulmonary disease, unspecified: Secondary | ICD-10-CM

## 2014-06-24 DIAGNOSIS — S72001A Fracture of unspecified part of neck of right femur, initial encounter for closed fracture: Secondary | ICD-10-CM

## 2014-06-24 DIAGNOSIS — J849 Interstitial pulmonary disease, unspecified: Secondary | ICD-10-CM

## 2014-06-24 DIAGNOSIS — S72002A Fracture of unspecified part of neck of left femur, initial encounter for closed fracture: Principal | ICD-10-CM

## 2014-06-24 DIAGNOSIS — S72009A Fracture of unspecified part of neck of unspecified femur, initial encounter for closed fracture: Secondary | ICD-10-CM | POA: Diagnosis present

## 2014-06-24 LAB — GLUCOSE, CAPILLARY
GLUCOSE-CAPILLARY: 179 mg/dL — AB (ref 70–99)
Glucose-Capillary: 172 mg/dL — ABNORMAL HIGH (ref 70–99)
Glucose-Capillary: 225 mg/dL — ABNORMAL HIGH (ref 70–99)
Glucose-Capillary: 393 mg/dL — ABNORMAL HIGH (ref 70–99)
Glucose-Capillary: 322 mg/dL — ABNORMAL HIGH (ref 70–99)

## 2014-06-24 LAB — CBC WITH DIFFERENTIAL/PLATELET
Basophils Absolute: 0 10*3/uL (ref 0.0–0.1)
Basophils Relative: 0 % (ref 0–1)
EOS ABS: 0.2 10*3/uL (ref 0.0–0.7)
Eosinophils Relative: 3 % (ref 0–5)
HCT: 31.6 % — ABNORMAL LOW (ref 39.0–52.0)
Hemoglobin: 10.6 g/dL — ABNORMAL LOW (ref 13.0–17.0)
LYMPHS ABS: 0.8 10*3/uL (ref 0.7–4.0)
Lymphocytes Relative: 10 % — ABNORMAL LOW (ref 12–46)
MCH: 28.8 pg (ref 26.0–34.0)
MCHC: 33.5 g/dL (ref 30.0–36.0)
MCV: 85.9 fL (ref 78.0–100.0)
MONOS PCT: 9 % (ref 3–12)
Monocytes Absolute: 0.7 10*3/uL (ref 0.1–1.0)
NEUTROS ABS: 6.5 10*3/uL (ref 1.7–7.7)
Neutrophils Relative %: 78 % — ABNORMAL HIGH (ref 43–77)
Platelets: 195 10*3/uL (ref 150–400)
RBC: 3.68 MIL/uL — ABNORMAL LOW (ref 4.22–5.81)
RDW: 13.9 % (ref 11.5–15.5)
WBC: 8.3 10*3/uL (ref 4.0–10.5)

## 2014-06-24 LAB — COMPREHENSIVE METABOLIC PANEL
ALBUMIN: 2.9 g/dL — AB (ref 3.5–5.2)
ALK PHOS: 63 U/L (ref 39–117)
ALT: 11 U/L (ref 0–53)
AST: 11 U/L (ref 0–37)
Anion gap: 7 (ref 5–15)
BILIRUBIN TOTAL: 0.6 mg/dL (ref 0.3–1.2)
BUN: 12 mg/dL (ref 6–23)
CHLORIDE: 102 mmol/L (ref 96–112)
CO2: 31 mmol/L (ref 19–32)
CREATININE: 0.81 mg/dL (ref 0.50–1.35)
Calcium: 9 mg/dL (ref 8.4–10.5)
GFR calc Af Amer: >90 mL/min (ref >90)
GFR calc non Af Amer: >90 mL/min (ref >90)
Glucose, Bld: 181 mg/dL — ABNORMAL HIGH (ref 70–99)
POTASSIUM: 4 mmol/L (ref 3.5–5.1)
Sodium: 140 mmol/L (ref 135–145)
Total Protein: 5.9 g/dL — ABNORMAL LOW (ref 6.0–8.3)

## 2014-06-24 LAB — TROPONIN I

## 2014-06-24 LAB — URINALYSIS, ROUTINE W REFLEX MICROSCOPIC
BILIRUBIN URINE: NEGATIVE
Glucose, UA: >1000 mg/dL — AB
HGB URINE DIPSTICK: NEGATIVE
KETONES UR: NEGATIVE mg/dL
Leukocytes, UA: NEGATIVE
Nitrite: NEGATIVE
PROTEIN: NEGATIVE mg/dL
Specific Gravity, Urine: 1.019 (ref 1.005–1.030)
UROBILINOGEN UA: 0.2 mg/dL (ref 0.0–1.0)
pH: 6 (ref 5.0–8.0)

## 2014-06-24 LAB — TYPE AND SCREEN
ABO/RH(D): O POS
Antibody Screen: NEGATIVE

## 2014-06-24 LAB — SURGICAL PCR SCREEN
MRSA, PCR: NEGATIVE
STAPHYLOCOCCUS AUREUS: NEGATIVE

## 2014-06-24 LAB — TSH: TSH: 1.128 u[IU]/mL (ref 0.350–4.500)

## 2014-06-24 LAB — URINE MICROSCOPIC-ADD ON

## 2014-06-24 LAB — PROTIME-INR
INR: 1.06 (ref 0.00–1.49)
Prothrombin Time: 14 seconds (ref 11.6–15.2)

## 2014-06-24 LAB — DIGOXIN LEVEL

## 2014-06-24 LAB — APTT: APTT: 35 s (ref 24–37)

## 2014-06-24 MED ORDER — OXYCODONE HCL 5 MG PO TABS
5.0000 mg | ORAL_TABLET | ORAL | Status: DC | PRN
  Administered 2014-06-24 – 2014-06-27 (×9): 5 mg via ORAL
  Filled 2014-06-24 (×9): qty 1

## 2014-06-24 MED ORDER — ASPIRIN 325 MG PO TABS: 325.0000 mg | ORAL_TABLET | Freq: Every day | ORAL | Status: DC

## 2014-06-24 MED ORDER — POLYETHYLENE GLYCOL 3350 17 G PO PACK
17.0000 g | PACK | Freq: Every day | ORAL | Status: DC
  Administered 2014-06-24 – 2014-06-27 (×3): 17 g via ORAL
  Filled 2014-06-24 (×3): qty 1

## 2014-06-24 MED ORDER — CARVEDILOL 6.25 MG PO TABS: 6.2500 mg | ORAL_TABLET | Freq: Two times a day (BID) | ORAL | Status: DC

## 2014-06-24 MED ORDER — NITROGLYCERIN 0.4 MG SL SUBL: 0.4000 mg | SUBLINGUAL_TABLET | SUBLINGUAL | Status: DC | PRN

## 2014-06-24 MED ORDER — GLUCERNA SHAKE PO LIQD
237.0000 mL | Freq: Two times a day (BID) | ORAL | Status: DC
Start: 2014-06-24 — End: 2014-06-27
  Administered 2014-06-24: 237 mL via ORAL
  Filled 2014-06-24: qty 237

## 2014-06-24 MED ORDER — OXYCODONE-ACETAMINOPHEN 10-325 MG PO TABS: 1.0000 | ORAL_TABLET | ORAL | Status: DC | PRN

## 2014-06-24 MED ORDER — BUDESONIDE 0.25 MG/2ML IN SUSP
0.2500 mg | Freq: Two times a day (BID) | RESPIRATORY_TRACT | Status: DC
  Administered 2014-06-24 – 2014-06-27 (×4): 0.25 mg via RESPIRATORY_TRACT
  Filled 2014-06-24 (×4): qty 2

## 2014-06-24 MED ORDER — VITAMIN B-12 1000 MCG PO TABS
1000.0000 ug | ORAL_TABLET | Freq: Every day | ORAL | Status: DC
  Administered 2014-06-24 – 2014-06-27 (×4): 1000 ug via ORAL
  Filled 2014-06-24 (×4): qty 1

## 2014-06-24 MED ORDER — TORSEMIDE 20 MG PO TABS
20.0000 mg | ORAL_TABLET | Freq: Two times a day (BID) | ORAL | Status: DC
  Administered 2014-06-24 – 2014-06-27 (×7): 20 mg via ORAL
  Filled 2014-06-24 (×8): qty 1

## 2014-06-24 MED ORDER — METHYLPREDNISOLONE SODIUM SUCC 125 MG IJ SOLR
40.0000 mg | Freq: Every day | INTRAMUSCULAR | Status: DC
  Administered 2014-06-24 – 2014-06-26 (×2): 40 mg via INTRAVENOUS
  Filled 2014-06-24 (×2): qty 2

## 2014-06-24 MED ORDER — ALPRAZOLAM 0.25 MG PO TABS: 0.2500 mg | ORAL_TABLET | Freq: Two times a day (BID) | ORAL | Status: DC | PRN

## 2014-06-24 MED ORDER — INSULIN NPH (HUMAN) (ISOPHANE) 100 UNIT/ML ~~LOC~~ SUSP
10.0000 [IU] | Freq: Two times a day (BID) | SUBCUTANEOUS | Status: DC
  Administered 2014-06-24 – 2014-06-27 (×6): 10 [IU] via SUBCUTANEOUS
  Filled 2014-06-24 (×3): qty 10

## 2014-06-24 MED ORDER — NYSTATIN 100000 UNIT/GM EX OINT
TOPICAL_OINTMENT | Freq: Two times a day (BID) | CUTANEOUS | Status: DC
  Administered 2014-06-24 – 2014-06-27 (×4): via TOPICAL
  Filled 2014-06-24: qty 15

## 2014-06-24 MED ORDER — ISOSORBIDE MONONITRATE ER 60 MG PO TB24
60.0000 mg | ORAL_TABLET | Freq: Every day | ORAL | Status: DC
  Administered 2014-06-24 – 2014-06-27 (×4): 60 mg via ORAL
  Filled 2014-06-24 (×4): qty 1

## 2014-06-24 MED ORDER — SERTRALINE HCL 50 MG PO TABS
50.0000 mg | ORAL_TABLET | Freq: Every day | ORAL | Status: DC
  Administered 2014-06-24 – 2014-06-27 (×4): 50 mg via ORAL
  Filled 2014-06-24 (×4): qty 1

## 2014-06-24 MED ORDER — ATORVASTATIN CALCIUM 80 MG PO TABS
80.0000 mg | ORAL_TABLET | Freq: Every day | ORAL | Status: DC
  Administered 2014-06-24 – 2014-06-27 (×4): 80 mg via ORAL
  Filled 2014-06-24 (×4): qty 1

## 2014-06-24 MED ORDER — LEVALBUTEROL HCL 0.63 MG/3ML IN NEBU
0.6300 mg | INHALATION_SOLUTION | Freq: Four times a day (QID) | RESPIRATORY_TRACT | Status: DC | PRN
  Filled 2014-06-24: qty 3

## 2014-06-24 MED ORDER — INSULIN ASPART 100 UNIT/ML ~~LOC~~ SOLN
0.0000 [IU] | SUBCUTANEOUS | Status: DC
  Administered 2014-06-24: 3 [IU] via SUBCUTANEOUS
  Administered 2014-06-24: 2 [IU] via SUBCUTANEOUS
  Administered 2014-06-24: 9 [IU] via SUBCUTANEOUS
  Administered 2014-06-24: 2 [IU] via SUBCUTANEOUS
  Administered 2014-06-24: 7 [IU] via SUBCUTANEOUS
  Administered 2014-06-25: 3 [IU] via SUBCUTANEOUS
  Administered 2014-06-25: 7 [IU] via SUBCUTANEOUS
  Administered 2014-06-25: 2 [IU] via SUBCUTANEOUS
  Administered 2014-06-25: 9 [IU] via SUBCUTANEOUS
  Administered 2014-06-25 – 2014-06-26 (×3): 3 [IU] via SUBCUTANEOUS
  Administered 2014-06-26: 5 [IU] via SUBCUTANEOUS
  Administered 2014-06-26 – 2014-06-27 (×3): 2 [IU] via SUBCUTANEOUS
  Administered 2014-06-27 (×2): 7 [IU] via SUBCUTANEOUS
  Administered 2014-06-27: 2 [IU] via SUBCUTANEOUS

## 2014-06-24 MED ORDER — CARVEDILOL 6.25 MG PO TABS
6.2500 mg | ORAL_TABLET | Freq: Two times a day (BID) | ORAL | Status: DC
  Administered 2014-06-24 – 2014-06-27 (×9): 6.25 mg via ORAL
  Filled 2014-06-24 (×9): qty 1

## 2014-06-24 MED ORDER — GLUCERNA SHAKE PO LIQD: 237.0000 mL | Freq: Two times a day (BID) | ORAL | Status: DC

## 2014-06-24 MED ORDER — ENSURE COMPLETE PO LIQD
237.0000 mL | Freq: Every day | ORAL | Status: DC
  Administered 2014-06-24: 237 mL via ORAL

## 2014-06-24 MED ORDER — OXYCODONE-ACETAMINOPHEN 5-325 MG PO TABS
1.0000 | ORAL_TABLET | ORAL | Status: DC | PRN
  Administered 2014-06-24 – 2014-06-27 (×11): 1 via ORAL
  Filled 2014-06-24 (×11): qty 1

## 2014-06-24 MED ORDER — DIGOXIN 125 MCG PO TABS
0.2500 mg | ORAL_TABLET | Freq: Every day | ORAL | Status: DC
  Administered 2014-06-24 – 2014-06-27 (×5): 0.25 mg via ORAL
  Filled 2014-06-24 (×4): qty 2

## 2014-06-24 MED ORDER — MORPHINE SULFATE 2 MG/ML IJ SOLN
1.0000 mg | INTRAMUSCULAR | Status: DC | PRN
  Administered 2014-06-24 – 2014-06-25 (×2): 1 mg via INTRAVENOUS
  Filled 2014-06-24 (×3): qty 1

## 2014-06-24 NOTE — Consult Note (Signed)
CARDIOLOGY CONSULT NOTE       Patient ID: Timothy Trujillo MRN: [id number] DOB/AGE: Oct 11, 1950 64 y.o.  Admit date: 06/23/2014 Referring Physician:  Roda Shutters Primary Physician: Fredirick Maudlin, MD Primary Cardiologist:  Liz Beach Reason for Consultation: Preop Evaluation  Principal Problem:   Femoral neck fracture Active Problems:   Ischemic cardiomyopathy   COPD, severe  O2 dependent   Diabetes mellitus type 2, controlled   Hip fracture   HPI:  64 yo DNR with previous referral to palliative care.  Hx of DM2, CAD, chronic systolic HF, hyperlipidemia, PAD, HTN.   CABG 20 years ago at [facility] as well as prior stenting. Last cath 01/2013: LM 60%, LAD 60% mid, LCX prox patent stent with distal occluded LCX, RCA occluded. SVG-RCA occluded, SVG-OM occluded. Disease has been medically managed due to poor targets and multiple advanced comorbidities. Echo 01/2013 LVEF 15-20%. He does not have and ICD as he is DNR. Medical therapy for his chronic systolic HF has been somewhat limited due to orthostatic symptoms low BP and tachycardia  Multiple recent admissions for CHF  Also  COPD on home oxygen.  Fractured left hip Monday  In a lot of pain in the bed.  No current chest pain.    ROS All other systems reviewed and negative except as noted above  Past Medical History  Diagnosis Date  . Uncontrolled diabetes mellitus     a. A1C 12.8 in 01/2013.  [name] Chronic systolic heart failure   . Obesity   . Hyperlipidemia   . Peripheral vascular disease   . Hypertension   . Asthma   . CAD (coronary artery disease)     a. Prior CABG 20 yrs ago (?~1994). b. Hx of multiple stents, prev followed in [place]. c. [place] 01/2013: cath moderate disease in LAD, distal LCx, prox RCA occluded, 2 SVGs occluded, felt to be stable from prior cath -> for medical therapy.  . Ischemic cardiomyopathy     a. EF 2012: 55-60. b. EF 12/2011: 25-30%. c. Echo 01/2013: EF 15-20%.  . Stroke     15 years ago  .  Arthritis   . Anxiety   . Depression   . 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 Chronic respiratory failure     a. Due to COPD.  . Osteomyelitis 2013    was planned for chronic suppressive antibiotics but cannot afford this any longer  . DNR no code (do not resuscitate) 07/2013  . CHF (congestive heart failure)   . Diabetes mellitus without complication     Family History  Problem Relation Age of Onset  . Dementia Mother   . Arthritis Mother   . Heart disease Father   . Heart disease Brother     History   Social History  . Marital Status: Widowed    Spouse Name: N/A  . Number of Children: N/A  . Years of Education: N/A   Occupational History  . Not on file.   Social History Main Topics  . Smoking status: Former Smoker -- 3.00 packs/day for 26 years    Start date: 04/08/1957    Quit date: 04/09/1983  . Smokeless tobacco: Never Used  . Alcohol Use: No  . Drug Use: No  . Sexual Activity: No   Other Topics Concern  . Not on file   Social History Narrative   Pt lives in [place] alone.   Disabled  Previously worked as a Naval architect    Past Surgical History  Procedure Laterality Date  . Coronary artery bypass graft    . Coronary stent placement  2008    CABG grafts closed  . Joint replacement    . Cholecystectomy    . Eye surgery    . I&d extremity  06/12/2011    Procedure: IRRIGATION AND DEBRIDEMENT EXTREMITY;  Surgeon: Javier Docker, MD;  Location: [facility] OR;  Service: Orthopedics;  Laterality: Right;  I&D Right Tibia  . Left and right heart catheterization with coronary/graft angiogram N/A 01/15/2013    Procedure: LEFT AND RIGHT HEART CATHETERIZATION WITH Isabel Caprice;  Surgeon: Alvia Grove, MD;  Location: [facility] CATH LAB;  Service: Cardiovascular;  Laterality: N/A;     . atorvastatin  80 mg Oral Daily  . budesonide  0.25 mg Nebulization BID  . carvedilol  6.25 mg Oral BID WC  . digoxin  0.25  mg Oral Daily  . insulin aspart  0-9 Units Subcutaneous Q4H  . insulin NPH Human  10 Units Subcutaneous BID AC & HS  . isosorbide mononitrate  60 mg Oral Daily  . methylPREDNISolone (SOLU-MEDROL) injection  40 mg Intravenous Daily  . nystatin ointment   Topical BID  . polyethylene glycol  17 g Oral Daily  . sertraline  50 mg Oral Daily  . vitamin B-12  1,000 mcg Oral Daily      Physical Exam: Blood pressure 120/70, pulse 123, temperature 99.1 F (37.3 C), temperature source Oral, resp. rate 18, height 6\' 3"  (1.905 m), weight 230 lb (104.327 kg), SpO2 97 %.    In pain in bed Chronically ill white male  HEENT: normal Neck supple with no adenopathy JVP normal no bruits no thyromegaly Lungs clear with no wheezing and good diaphragmatic motion Heart:  S1/S2 MR murmur, no rub, gallop or click PMI normal Abdomen: benighn, BS positve, no tenderness, no AAA no bruit.  No HSM or HJR Poor pulses below knee Trace  edema Neuro non-focal Skin warm and dry Left hip fracture    Labs:   Lab Results  Component Value Date   WBC 8.3 06/24/2014   HGB 10.6* 06/24/2014   HCT 31.6* 06/24/2014   MCV 85.9 06/24/2014   PLT 195 06/24/2014    Recent Labs Lab 06/24/14 0335  NA 140  K 4.0  CL 102  CO2 31  BUN 12  CREATININE 0.81  CALCIUM 9.0  PROT 5.9*  BILITOT 0.6  ALKPHOS 63  ALT 11  AST 11  GLUCOSE 181*   Lab Results  Component Value Date   CKTOTAL 29 09/03/2010   CKMB 2.0 09/03/2010   TROPONINI <0.03 06/24/2014    Lab Results  Component Value Date   CHOL 278* 05/07/2014   CHOL 245* 01/15/2013   Lab Results  Component Value Date   HDL 43 05/07/2014   HDL 22* 01/15/2013   Lab Results  Component Value Date   LDLCALC 215* 05/07/2014   LDLCALC 155* 01/15/2013   Lab Results  Component Value Date   TRIG 99 05/07/2014   TRIG 340* 01/15/2013   Lab Results  Component Value Date   CHOLHDL 6.5 05/07/2014   CHOLHDL 11.1 01/15/2013   No results found for: LDLDIRECT      Radiology: Dg Ribs Unilateral W/chest Left  06/23/2014   CLINICAL DATA:  Fall 3 days ago. Flank pain. Left hip pain. Left rib pain.  EXAM: LEFT RIBS AND CHEST - 3+ VIEW  COMPARISON:  06/04/2014  FINDINGS: No fracture or other bone lesions are seen involving the ribs. There is no evidence of pneumothorax or pleural effusion. There is stable cardiomegaly. Prior CABG. Bilateral diffuse interstitial thickening, likely chronic.  IMPRESSION: 1. No acute osseous injury of the left ribs. 2. Bilateral diffuse chronic interstitial lung disease. Mild superimposed interstitial edema cannot be excluded.   Electronically Signed   By: Elige Ko   On: 06/23/2014 22:31   Ct Head Wo Contrast  06/24/2014   CLINICAL DATA:  Larey Seat on Tuesday, fracturing left hip.  EXAM: CT HEAD WITHOUT CONTRAST  TECHNIQUE: Contiguous axial images were obtained from the base of the skull through the vertex without intravenous contrast.  COMPARISON:  04/01/2008  FINDINGS: There is no intracranial hemorrhage, mass or evidence of acute infarction. There is a 2.0 x 3.5 cm CSF density abnormality in the inferior right basal ganglia, perhaps a very large perivascular space. There is a 7 mm enlarged perivascular space in the inferior left basal ganglia. These findings are unchanged from 04/01/2008. There is mild generalized atrophy and mild white matter hypodensity which may represent chronic small vessel ischemic disease. Calvarium and skullbase are intact.  IMPRESSION: No evidence of acute intracranial traumatic injury. Unchanged CSF density abnormalities in the inferior basal ganglia, probably very large perivascular spaces. Mild atrophy and chronic microvascular disease.   Electronically Signed   By: Ellery Plunk M.D.   On: 06/24/2014 00:37   Dg Chest Portable 1 View  06/04/2014   CLINICAL DATA:  Shortness of breath and sharp constant chest pain. Symptoms for 1 day.  EXAM: PORTABLE CHEST - 1 VIEW  COMPARISON:  Radiographs and CT  05/07/2014  FINDINGS: Patient is post median sternotomy. Cardiomegaly is slightly decreased. Pulmonary edema has progressed from prior. Blunting of both costophrenic angles, suspect small pleural effusions. No pneumothorax. No confluent airspace disease.  IMPRESSION: Moderate CHF.   Electronically Signed   By: Rubye Oaks M.D.   On: 06/04/2014 04:58   Dg Hip Unilat With Pelvis 2-3 Views Left  06/23/2014   CLINICAL DATA:  Larey Seat 3 days ago.  Unable to walk.  EXAM: LEFT HIP (WITH PELVIS) 2-3 VIEWS  COMPARISON:  None.  FINDINGS: There is a left femoral neck fracture with mild valgus angulation. No bone lesion is evident. No other fractures are evident.  IMPRESSION: Left femoral neck fracture   Electronically Signed   By: Ellery Plunk M.D.   On: 06/23/2014 22:31    EKG:  ST rate 106  Old inferior and septal infarcts    ASSESSMENT AND PLAN:  Preop/CAD:  Very high risk  Cannot recommend general anesthesia and orthopedic surgery for this patient.  Old grafts occluded Distal RCA/Circ occluded with known 60% LM/LAD 2 years ago.  Multiple reecent admissions for CHF with poor resting hemodynamics and poor functional capacity.  Very high risk for MI, CHF, prolonged ventilation and arrhythmia including afib and VT.  He does not have an AICD in and has been DNR/Palliative care.  That being said the patient is willing to have surgery to alleviate pain and have a chance to be ambulatory again despite the risk.  We are happy to follow along and help manage any complications that may arise CHF:  Currently appears euvolemic  Will resume home dose of demedex 20 bid CAD:  No angina or acute ECG changes  Continue nitrates and beta blocker plavix held for possible surgery   Tachycardia:  Chronic likely form low SV/CO  Continue  coreg and digoxin May need esmolol drip and iv amiodarone post op  Signed: Charlton Hawseter Grae Cannata 06/24/2014, 8:42 AM

## 2014-06-24 NOTE — Progress Notes (Signed)
Orthopedic Tech Progress Note Patient Details:  Huntley DecJames L Mula 07-14-1950 [id number]  Ortho Devices Ortho Device/Splint Location: applied ohf to bed Ortho Device/Splint Interventions: Ordered, Application   Jennye MoccasinHughes, Deniel Mcquiston Craig 06/24/2014, 3:17 PM

## 2014-06-24 NOTE — Progress Notes (Signed)
INITIAL NUTRITION ASSESSMENT  Pt meets criteria for SEVERE MALNUTRITION in the context of chronic illness as evidenced by a 12.2% weight loss in 6 months and energy intake </= 75% for >/= 1 month.  DOCUMENTATION CODES Per approved criteria  -Severe malnutrition in the context of chronic illness   INTERVENTION: Provide Glucerna Shake po BID, each supplement provides 220 kcal and 10 grams of protein.  Provide Ensure Enlive po once daily, each supplement provides 350 kcal and 20 grams of protein  Encourage adequate PO intake.  NUTRITION DIAGNOSIS: Inadequate oral intake related to decreased appetite as evidenced by meal completion of 50%.   Goal: Pt to meet >/= 90% of their estimated nutrition needs   Monitor:  PO intake, weight trends, labs, I/O's  Reason for Assessment: MD consult  64 y.o. male  Admitting Dx: Femoral neck fracture  ASSESSMENT: Pt with history of end-stage cardiomyopathy last EF measured was 20-25% in January 2016, CAD status post CABG, severe COPD on home oxygen, chronic tachycardia, diabetes mellitus type 2 had a fall. X-rays revealed a left hip fracture.  Pt reports having a decreased appetite over the past 1 year. Pt reports he has been consuming 1 meal and at least 2 Ensure shakes daily. Pt reports gradual weight loss of 240 lbs over the past 3 years due to inadequate intake. Per Epic weight records, pt with a 12.2% weight loss in 6 months. Current meal completion is 50%. RD to order Ensure. Pt additionally was agreeable to trying Glucerna Shake as blood glucose has ben elevated. RD to order. Plans for surgery tomorrow. Pt was encouraged to eat his food at meals and to drink his supplements. Pt was educated on adequate protein and calorie  intake at home as well as nutritional supplement use.   Nutrition Focused Physical Exam:  Subcutaneous Fat:  Orbital Region: N/A Upper Arm Region: Moderate depletion Thoracic and Lumbar Region: WNL  Muscle:  Temple  Region: N/A Clavicle Bone Region: Moderate depletion Clavicle and Acromion Bone Region: Moderate depletion Scapular Bone Region: N/A Dorsal Hand: N/A Patellar Region: WNL Anterior Thigh Region: WNL Posterior Calf Region: WNL  Edema: non-pitting LLE   Labs and medications reviewed.  Height: Ht Readings from Last 1 Encounters:  06/23/14  (1.905 m)    Weight: Wt Readings from Last 1 Encounters:  06/23/14 230 lb (104.327 kg)    Ideal Body Weight: 196 lbs  % Ideal Body Weight: 117%  Wt Readings from Last 10 Encounters:  06/23/14 230 lb (104.327 kg)  06/08/14 225 lb 9.6 oz (102.331 kg)  05/10/14 227 lb 8 oz (103.193 kg)  04/25/14 232 lb (105.235 kg)  03/21/14 242 lb (109.77 kg)  02/26/14 240 lb 9.6 oz (109.135 kg)  12/14/13 262 lb (118.842 kg)  09/10/13 252 lb (114.306 kg)  08/10/13 260 lb (117.935 kg)  08/01/13 265 lb 10.5 oz (120.5 kg)    Usual Body Weight: 265 lbs  % Usual Body Weight: 13%  BMI:  Body mass index is 28.75 kg/(m^2).  Estimated Nutritional Needs: Kcal: 2200-2400 Protein: 110-125  Fluid: 2.2 - 2.4 L/day  Skin: non-pitting LLE edema  Diet Order: Diet NPO time specified Diet Carb Modified  EDUCATION NEEDS: -Education needs addressed  No intake or output data in the 24 hours ending 06/24/14 0902  Last BM: 3/17  Labs:   Recent Labs Lab 06/23/14 2241 06/24/14 0335  NA 138 140  K 4.2 4.0  CL 101 102  CO2 28 31  BUN 14 12  CREATININE 0.91 0.81  CALCIUM 8.8 9.0  GLUCOSE 208* 181*    CBG (last 3)   Recent Labs  06/24/14 0458 06/24/14 0730  GLUCAP 179* 172*    Scheduled Meds: . atorvastatin  80 mg Oral Daily  . budesonide  0.25 mg Nebulization BID  . carvedilol  6.25 mg Oral BID WC  . digoxin  0.25 mg Oral Daily  . insulin aspart  0-9 Units Subcutaneous Q4H  . insulin NPH Human  10 Units Subcutaneous BID AC & HS  . isosorbide mononitrate  60 mg Oral Daily  . methylPREDNISolone (SOLU-MEDROL) injection  40 mg  Intravenous Daily  . nystatin ointment   Topical BID  . polyethylene glycol  17 g Oral Daily  . sertraline  50 mg Oral Daily  . torsemide  20 mg Oral BID  . vitamin B-12  1,000 mcg Oral Daily    Continuous Infusions:   Past Medical History  Diagnosis Date  . Uncontrolled diabetes mellitus     a. A1C 12.8 in 01/2013.  [name]. Chronic systolic heart failure   . Obesity   . Hyperlipidemia   . Peripheral vascular disease   . Hypertension   . Asthma   . CAD (coronary artery disease)     a. Prior CABG 20 yrs ago (?~1994). b. Hx of [place], [place] c[place] 01[place]: cath moderate disease in LAD, distal LCx, prox RCA occluded, 2 SVGs occluded, felt to be stable from prior cath -> for medical therapy.  . Ischemic cardiomyopathy     a. EF 2012: 55-60. b. EF 12/2011: 25-30%. c. Echo 01/2013: EF 15-20%.  . Stroke     15 years ago  . Arthritis   . Anxiety   . Depression   . 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 Chronic respiratory failure     a. Due to COPD.  . Osteomyelitis 2013    was planned for chronic suppressive antibiotics but cannot afford this any longer  . DNR no code (do not resuscitate) 07/2013  . CHF (congestive heart failure)   . Diabetes mellitus without complication     Past Surgical History  Procedure Laterality Date  . Coronary artery bypass graft    . Coronary stent placement  2008    CABG grafts closed  . Joint replacement    . Cholecystectomy    . Eye surgery    . I&d extremity  06/12/2011    Procedure: IRRIGATION AND DEBRIDEMENT EXTREMITY;  Surgeon: Javier DockerJeffrey C Beane, MD;  Location: [facility] OR;  Service: Orthopedics;  Laterality: Right;  I&D Right Tibia  . Left and right heart catheterization with coronary/graft angiogram N/A 01/15/2013    Procedure: LEFT AND RIGHT HEART CATHETERIZATION WITH Isabel CapriceORONARY/GRAFT ANGIOGRAM;  Surgeon: Alvia GroveJr Philip J Nahser, MD;  [facility];  Service:  Cardiovascular;  Laterality: N/A;    Marijean NiemannStephanie La, MS, RD, LDN Pager # [phone] After hours/ weekend pager # [phone]

## 2014-06-24 NOTE — H&P (Signed)
[facility] History and Physical  Timothy Trujillo:[id number] DOB: 1950/09/24 DOA: 06/23/2014  Referring physician: ER physician. PCP: Fredirick Maudlin, MD   Chief Complaint: Left hip pain.  HPI: Timothy Trujillo is a 64 y.o. male with history of end-stage cardiomyopathy last EF measured was 20-25% in January 2016, CAD status post CABG, severe COPD on home oxygen, chronic tachycardia, diabetes mellitus type 2 had a fall 2 days ago when patient was trying to go to the bathroom. Patient does not recall the exact circumstances of the fall. Since the patient's pain did not improve patient came to the ER last night. X-rays revealed a left hip fracture and on-call orthopedic surgeon has been consulted. Patient at this time denies any chest pain. Patient states he has chronic shortness of breath and is at baseline. On exam patient is not in distress. Denies any nausea vomiting headache visual symptoms fever chills diarrhea.  Review of Systems: As presented in the history of presenting illness, rest negative.  Past Medical History  Diagnosis Date  . Uncontrolled diabetes mellitus     a. A1C 12.8 in 01/2013.  [name] Chronic systolic heart failure   . Obesity   . Hyperlipidemia   . Peripheral vascular disease   . Hypertension   . Asthma   . CAD (coronary artery disease)     a. Prior CABG 20 yrs ago (?~1994). b. Hx of multiple stents, prev followed in [place]. c. [place] 01/2013: cath moderate disease in LAD, distal LCx, prox RCA occluded, 2 SVGs occluded, felt to be stable from prior cath -> for medical therapy.  . Ischemic cardiomyopathy     a. EF 2012: 55-60. b. EF 12/2011: 25-30%. c. Echo 01/2013: EF 15-20%.  . Stroke     15 years ago  . Arthritis   . Anxiety   . Depression   . 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 Chronic respiratory failure     a. Due to COPD.  . Osteomyelitis 2013    was planned for chronic suppressive antibiotics but  cannot afford this any longer  . DNR no code (do not resuscitate) 07/2013  . CHF (congestive heart failure)   . Diabetes mellitus without complication    Past Surgical History  Procedure Laterality Date  . Coronary artery bypass graft    . Coronary stent placement  2008    CABG grafts closed  . Joint replacement    . Cholecystectomy    . Eye surgery    . I&d extremity  06/12/2011    Procedure: IRRIGATION AND DEBRIDEMENT EXTREMITY;  Surgeon: Javier Docker, MD;  Location: [facility] OR;  Service: Orthopedics;  Laterality: Right;  I&D Right [place]  . Left and right heart catheterization with coronary/graft angiogram N/A 01/15/2013    Procedure: LEFT AND RIGHT HEART CATHETERIZATION WITH Isabel Caprice;  Surgeon: Alvia Grove, MD;  Location: [facility] CATH LAB;  Service: Cardiovascular;  Laterality: N/A;   Social History:  reports that he quit smoking about 31 years ago. He started smoking about 57 years ago. He has never used smokeless tobacco. He reports that he does not drink alcohol or use illicit drugs. Where does patient live home. Can patient participate in ADLs? Yes.  Allergies  Allergen Reactions  . Baycol [Cerivastatin] Other (See Comments)    Patient states it "about killed me" kidney shut down. Black stools.  . Stadol [Butorphanol Tartrate] Other (See Comments)    "  makes him crazy"    Family History:  Family History  Problem Relation Age of Onset  . Dementia Mother   . Arthritis Mother   . Heart disease Father   . Heart disease Brother       Prior to Admission medications   Medication Sig Start Date End Date Taking? Authorizing Provider  acetaminophen (TYLENOL) 325 MG tablet Take 2 tablets (650 mg total) by mouth every 6 (six) hours as needed for mild pain (or Fever >/= 101). 06/09/14  Yes Kari Baars, MD  ALPRAZolam Prudy Feeler) 0.25 MG tablet Take 1 tablet (0.25 mg total) by mouth 2 (two) times daily as needed for anxiety. 05/10/14  Yes Kari Baars, MD  aspirin 325 MG  tablet Take 325 mg by mouth daily.   Yes Historical Provider, MD  atorvastatin (LIPITOR) 80 MG tablet Take 1 tablet (80 mg total) by mouth daily. 06/09/14  Yes Kari Baars, MD  budesonide (PULMICORT) 0.25 MG/2ML nebulizer solution Take 2 mLs (0.25 mg total) by nebulization 2 (two) times daily. 06/09/14  Yes Kari Baars, MD  carvedilol (COREG) 6.25 MG tablet Take 1 tablet (6.25 mg total) by mouth 2 (two) times daily. 04/25/14  Yes Jodelle Gross, NP  clopidogrel (PLAVIX) 75 MG tablet Take 1 tablet (75 mg total) by mouth daily. 04/25/14  Yes Jodelle Gross, NP  digoxin (LANOXIN) 0.25 MG tablet Take 1 tablet (0.25 mg total) by mouth daily. 06/09/14  Yes Kari Baars, MD  diphenhydrAMINE (BENADRYL) 50 MG tablet Take 1 tablet (50 mg total) by mouth every 6 (six) hours as needed for itching. 05/10/14  Yes Kari Baars, MD  feeding supplement, ENSURE COMPLETE, (ENSURE COMPLETE) LIQD Take 237 mLs by mouth 2 (two) times daily between meals. 06/09/14  Yes Kari Baars, MD  hydrocortisone 2.5 % cream Apply 1 application topically daily as needed (itching).  04/25/14  Yes Historical Provider, MD  insulin NPH Human (HUMULIN N,NOVOLIN N) 100 UNIT/ML injection Inject 0.4 mLs (40 Units total) into the skin daily before breakfast. Patient taking differently: Inject 110 Units into the skin 2 (two) times daily.  06/09/14  Yes Kari Baars, MD  isosorbide mononitrate (IMDUR) 60 MG 24 hr tablet Take 1 tablet (60 mg total) by mouth daily. 04/25/14  Yes Jodelle Gross, NP  nitroGLYCERIN (NITROSTAT) 0.4 MG SL tablet Place 1 tablet (0.4 mg total) under the tongue every 5 (five) minutes as needed for chest pain. 04/25/14  Yes Jodelle Gross, NP  ondansetron (ZOFRAN) 4 MG tablet Take 1 tablet (4 mg total) by mouth every 6 (six) hours as needed for nausea. 06/09/14  Yes Kari Baars, MD  oxyCODONE-acetaminophen (PERCOCET) 10-325 MG per tablet Take 1 tablet by mouth every 4 (four) hours as needed for pain.   Yes  Historical Provider, MD  polyethylene glycol (MIRALAX / GLYCOLAX) packet Take 17 g by mouth daily. 06/09/14  Yes Kari Baars, MD  potassium chloride SA (K-DUR,KLOR-CON) 20 MEQ tablet Take 1 tablet (20 mEq total) by mouth 2 (two) times daily. 06/09/14  Yes Kari Baars, MD  predniSONE (DELTASONE) 10 MG tablet Take 1 tablet (10 mg total) by mouth daily with breakfast. 06/09/14  Yes Kari Baars, MD  sertraline (ZOLOFT) 50 MG tablet Take 1 tablet (50 mg total) by mouth daily. 05/10/14  Yes Kari Baars, MD  torsemide (DEMADEX) 20 MG tablet Take 1 tablet (20 mg total) by mouth 2 (two) times daily. 06/09/14  Yes Kari Baars, MD  vitamin B-12 (CYANOCOBALAMIN) 1000 MCG tablet Take  1,000 mcg by mouth daily.   Yes Historical Provider, MD  vitamin C (ASCORBIC ACID) 500 MG tablet Take 1,000 mg by mouth at bedtime.   Yes Historical Provider, MD  vitamin E 400 UNIT capsule Take 400 Units by mouth at bedtime.    Yes Historical Provider, MD  insulin aspart (NOVOLOG) 100 UNIT/ML injection Inject 0-15 Units into the skin 3 (three) times daily with meals. Patient not taking: Reported on 06/23/2014 06/09/14   Kari Baars, MD  insulin aspart (NOVOLOG) 100 UNIT/ML injection Inject 0-5 Units into the skin at bedtime. Patient not taking: Reported on 06/23/2014 06/09/14   Kari Baars, MD  insulin NPH Human (HUMULIN N,NOVOLIN N) 100 UNIT/ML injection Inject 0.2 mLs (20 Units total) into the skin at bedtime. Patient not taking: Reported on 06/23/2014 06/09/14   Kari Baars, MD  levalbuterol Pauline Aus) 0.63 MG/3ML nebulizer solution Take 3 mLs (0.63 mg total) by nebulization every 6 (six) hours as needed for wheezing or shortness of breath. 06/09/14   Kari Baars, MD    Physical Exam: Filed Vitals:   06/23/14 2245 06/23/14 2300 06/23/14 2315 06/24/14 0018  BP: 110/71 113/74 127/70 116/92  Pulse: 112 117 112 119  Temp:    98.7 F (37.1 C)  TempSrc:      Resp: 118  Height:      Weight:      SpO2: 94% 92%  97% 99%     General:  Moderately built and nourished.  Eyes: Anicteric no pallor.  ENT: No discharge from the ears eyes nose or mouth.  Neck: No mass felt. No JVD appreciated.  Cardiovascular: S1-S2 heard tachycardic.  Respiratory: No rhonchi or crepitations.  Abdomen: Soft nontender bowel sounds present.  Skin: No rash.  Musculoskeletal: Pain on moving the left hip. No edema.  Psychiatric: Appears normal.  Neurologic: Alert awake oriented to time place and person. Moves all extremities.  Labs on Admission:  Basic Metabolic Panel:  Recent Labs Lab 06/23/14 2241  NA 138  K 4.2  CL 101  CO2 28  GLUCOSE 208*  BUN 14  CREATININE 0.91  CALCIUM 8.8   Liver Function Tests:  Recent Labs Lab 06/23/14 2241  AST 15  ALT 10  ALKPHOS 64  BILITOT 0.6  PROT 5.9*  ALBUMIN 2.9*   No results for input(s): LIPASE, AMYLASE in the last 168 hours. No results for input(s): AMMONIA in the last 168 hours. CBC:  Recent Labs Lab 06/23/14 2241  WBC 9.6  NEUTROABS 7.5  HGB 10.7*  HCT 31.8*  MCV 85.5  PLT 182   Cardiac Enzymes: No results for input(s): CKTOTAL, CKMB, CKMBINDEX, TROPONINI in the last 168 hours.  BNP (last 3 results)  Recent Labs  05/06/14 2355 06/04/14 0325  BNP 646.0* 531.0*    ProBNP (last 3 results)  Recent Labs  07/30/13 1523 02/25/14 2200  PROBNP 604.2* 1014.0*    CBG: No results for input(s): GLUCAP in the last 168 hours.  Radiological Exams on Admission: Dg Ribs Unilateral W/chest Left  06/23/2014   CLINICAL DATA:  Fall 3 days ago. Flank pain. Left hip pain. Left rib pain.  EXAM: LEFT RIBS AND CHEST - 3+ VIEW  COMPARISON:  06/04/2014  FINDINGS: No fracture or other bone lesions are seen involving the ribs. There is no evidence of pneumothorax or pleural effusion. There is stable cardiomegaly. Prior CABG. Bilateral diffuse interstitial thickening, likely chronic.  IMPRESSION: 1. No acute osseous injury of the left ribs. 2. Bilateral  diffuse chronic interstitial lung disease. Mild superimposed interstitial edema cannot be excluded.   Electronically Signed   By: Elige KoHetal  Patel   On: 06/23/2014 22:31   Ct Head Wo Contrast  06/24/2014   CLINICAL DATA:  Larey SeatFell on Tuesday, fracturing left hip.  EXAM: CT HEAD WITHOUT CONTRAST  TECHNIQUE: Contiguous axial images were obtained from the base of the skull through the vertex without intravenous contrast.  COMPARISON:  04/01/2008  FINDINGS: There is no intracranial hemorrhage, mass or evidence of acute infarction. There is a 2.0 x 3.5 cm CSF density abnormality in the inferior right basal ganglia, perhaps a very large perivascular space. There is a 7 mm enlarged perivascular space in the inferior left basal ganglia. These findings are unchanged from 04/01/2008. There is mild generalized atrophy and mild white matter hypodensity which may represent chronic small vessel ischemic disease. Calvarium and skullbase are intact.  IMPRESSION: No evidence of acute intracranial traumatic injury. Unchanged CSF density abnormalities in the inferior basal ganglia, probably very large perivascular spaces. Mild atrophy and chronic microvascular disease.   Electronically Signed   By: Ellery Plunkaniel R Mitchell M.D.   On: 06/24/2014 00:37   Dg Hip Unilat With Pelvis 2-3 Views Left  06/23/2014   CLINICAL DATA:  Larey SeatFell 3 days ago.  Unable to walk.  EXAM: LEFT HIP (WITH PELVIS) 2-3 VIEWS  COMPARISON:  None.  FINDINGS: There is a left femoral neck fracture with mild valgus angulation. No bone lesion is evident. No other fractures are evident.  IMPRESSION: Left femoral neck fracture   Electronically Signed   By: Ellery Plunkaniel R Mitchell M.D.   On: 06/23/2014 22:31    EKG: Independently reviewed. Sinus tachycardia.  Assessment/Plan Principal Problem:   Femoral neck fracture Active Problems:   Ischemic cardiomyopathy   COPD, severe  O2 dependent   Diabetes mellitus type 2, controlled   Hip fracture   1. Left hip fracture status post  fall - patient is high risk for surgery given his cardiomyopathy CAD status post CABG and severe COPD. Patient however is ambulatory and will benefit from surgery but I will get pulmonary and cardiology consults prior to surgery. For now I will keep patient nothing by mouth except medications. Pain relief medications. 2. CAD status post CABG - denies any chest pain. Continue statins aspirin and beta blockers. 3. Severe COPD - presently not wheezing. Since patient is on chronic steroids I have placed patient on IV steroids and stress dose. Continue nebulizer. 4. Chronic tachycardia - patient is on digoxin and beta blockers. 5. Diabetes mellitus type 2 uncontrolled - patient is on NPH insulin. Since patient is nothing by mouth I have decreased the dose. Closely follow with sliding scale coverage. 6. Anemia - follow CBC.   DVT Prophylaxis SCDs for now.  Code Status: DO NOT RESUSCITATE.  Family Communication: None.  Disposition Plan: Admit to inpatient.    Zelta Enfield N. [facility] Pager [phone].  If 7PM-7AM, please contact night-coverage [email or web address] Password [email or web address] 06/24/2014, 2:02 AM

## 2014-06-24 NOTE — Consult Note (Signed)
Name: Timothy Trujillo MRN: [id number] DOB: 1950/11/21    ADMISSION DATE:  06/23/2014 CONSULTATION DATE:  06/24/2014  REFERRING MD :  Dr Toniann Fail  CHIEF COMPLAINT:    BRIEF PATIENT DESCRIPTION: 64 year old male presented to [facility] ED 3/17 2 days post fall. X-ray revealed a left hip fracture and he was admitted for potential surgical intervention. PCCM to consult for pre-op clearance.  SIGNIFICANT EVENTS  3/15 fall 3/17 to ED with hip pain  STUDIES:  CTA chest 05/08/2014 > Mild chronic interstitial lung disease. Pleural calcification which may reflect prior asbestos exposure. Small bilateral pleural effusions.  Hip X-ray 3/17 - Left femoral neck fracture. CT head 3/17 - No evidence of acute intracranial traumatic injury. Unchanged CSF density abnormalities in the inferior basal ganglia, probably very large perivascular spaces. Mild atrophy and chronic microvascular disease.  HISTORY OF PRESENT ILLNESS:  64 year old male with PMH as below, which is significant for ILD &  COPD on home O2 and prednisone (has seen VS in past bu FU with Juanetta Gosling), also with end-stage cardiomyopathy last EF measured was 20-25% in January 2016, CAD post CABG, and DM. He has a DNR order in place. He was recently admitted to [facility] for CHF exacerbation. He was discharged 3/3. 3/15 he fell while walking to bathroom and noted hip pain. 2 days later when pain did not improve he presented to ED 3/17. He was found to have L femoral head fracture. He was admitted to [facility] with orthopaedic consultation for surgical intervention. PCCM has been consulted for pre-op pulmonary clearance.   PAST MEDICAL HISTORY :   has a past medical history of Uncontrolled diabetes mellitus; Chronic systolic heart failure; Obesity; Hyperlipidemia; Peripheral vascular disease; Hypertension; Asthma; CAD (coronary artery disease); Ischemic cardiomyopathy; Stroke; Arthritis; Anxiety; Depression; COPD, severe  O2 dependent (01/16/2013); Chronic  respiratory failure; Osteomyelitis (2013); DNR no code (do not resuscitate) (07/2013); CHF (congestive heart failure); and Diabetes mellitus without complication.  has past surgical history that includes Coronary artery bypass graft; Coronary stent placement (2008); Joint replacement; Cholecystectomy; Eye surgery; I&D extremity (06/12/2011); and left and right heart catheterization with coronary/graft angiogram (N/A, 01/15/2013). Prior to Admission medications   Medication Sig Start Date End Date Taking? Authorizing Provider  acetaminophen (TYLENOL) 325 MG tablet Take 2 tablets (650 mg total) by mouth every 6 (six) hours as needed for mild pain (or Fever >/= 101). 06/09/14  Yes Kari Baars, MD  ALPRAZolam Prudy Feeler) 0.25 MG tablet Take 1 tablet (0.25 mg total) by mouth 2 (two) times daily as needed for anxiety. 05/10/14  Yes Kari Baars, MD  aspirin 325 MG tablet Take 325 mg by mouth daily.   Yes Historical Provider, MD  atorvastatin (LIPITOR) 80 MG tablet Take 1 tablet (80 mg total) by mouth daily. 06/09/14  Yes Kari Baars, MD  budesonide (PULMICORT) 0.25 MG/2ML nebulizer solution Take 2 mLs (0.25 mg total) by nebulization 2 (two) times daily. 06/09/14  Yes Kari Baars, MD  carvedilol (COREG) 6.25 MG tablet Take 1 tablet (6.25 mg total) by mouth 2 (two) times daily. 04/25/14  Yes Jodelle Gross, NP  clopidogrel (PLAVIX) 75 MG tablet Take 1 tablet (75 mg total) by mouth daily. 04/25/14  Yes Jodelle Gross, NP  digoxin (LANOXIN) 0.25 MG tablet Take 1 tablet (0.25 mg total) by mouth daily. 06/09/14  Yes Kari Baars, MD  diphenhydrAMINE (BENADRYL) 50 MG tablet Take 1 tablet (50 mg total) by mouth every 6 (six) hours as needed for itching.  05/10/14  Yes Kari Baars, MD  feeding supplement, ENSURE COMPLETE, (ENSURE COMPLETE) LIQD Take 237 mLs by mouth 2 (two) times daily between meals. 06/09/14  Yes Kari Baars, MD  hydrocortisone 2.5 % cream Apply 1 application topically daily as needed (itching).   04/25/14  Yes Historical Provider, MD  insulin NPH Human (HUMULIN N,NOVOLIN N) 100 UNIT/ML injection Inject 0.4 mLs (40 Units total) into the skin daily before breakfast. Patient taking differently: Inject 110 Units into the skin 2 (two) times daily.  06/09/14  Yes Kari Baars, MD  isosorbide mononitrate (IMDUR) 60 MG 24 hr tablet Take 1 tablet (60 mg total) by mouth daily. 04/25/14  Yes Jodelle Gross, NP  nitroGLYCERIN (NITROSTAT) 0.4 MG SL tablet Place 1 tablet (0.4 mg total) under the tongue every 5 (five) minutes as needed for chest pain. 04/25/14  Yes Jodelle Gross, NP  ondansetron (ZOFRAN) 4 MG tablet Take 1 tablet (4 mg total) by mouth every 6 (six) hours as needed for nausea. 06/09/14  Yes Kari Baars, MD  oxyCODONE-acetaminophen (PERCOCET) 10-325 MG per tablet Take 1 tablet by mouth every 4 (four) hours as needed for pain.   Yes Historical Provider, MD  polyethylene glycol (MIRALAX / GLYCOLAX) packet Take 17 g by mouth daily. 06/09/14  Yes Kari Baars, MD  potassium chloride SA (K-DUR,KLOR-CON) 20 MEQ tablet Take 1 tablet (20 mEq total) by mouth 2 (two) times daily. 06/09/14  Yes Kari Baars, MD  predniSONE (DELTASONE) 10 MG tablet Take 1 tablet (10 mg total) by mouth daily with breakfast. 06/09/14  Yes Kari Baars, MD  sertraline (ZOLOFT) 50 MG tablet Take 1 tablet (50 mg total) by mouth daily. 05/10/14  Yes Kari Baars, MD  torsemide (DEMADEX) 20 MG tablet Take 1 tablet (20 mg total) by mouth 2 (two) times daily. 06/09/14  Yes Kari Baars, MD  vitamin B-12 (CYANOCOBALAMIN) 1000 MCG tablet Take 1,000 mcg by mouth daily.   Yes Historical Provider, MD  vitamin C (ASCORBIC ACID) 500 MG tablet Take 1,000 mg by mouth at bedtime.   Yes Historical Provider, MD  vitamin E 400 UNIT capsule Take 400 Units by mouth at bedtime.    Yes Historical Provider, MD  insulin aspart (NOVOLOG) 100 UNIT/ML injection Inject 0-15 Units into the skin 3 (three) times daily with meals. Patient not  taking: Reported on 06/23/2014 06/09/14   Kari Baars, MD  insulin aspart (NOVOLOG) 100 UNIT/ML injection Inject 0-5 Units into the skin at bedtime. Patient not taking: Reported on 06/23/2014 06/09/14   Kari Baars, MD  insulin NPH Human (HUMULIN N,NOVOLIN N) 100 UNIT/ML injection Inject 0.2 mLs (20 Units total) into the skin at bedtime. Patient not taking: Reported on 06/23/2014 06/09/14   Kari Baars, MD  levalbuterol Pauline Aus) 0.63 MG/3ML nebulizer solution Take 3 mLs (0.63 mg total) by nebulization every 6 (six) hours as needed for wheezing or shortness of breath. 06/09/14   Kari Baars, MD   Allergies  Allergen Reactions  . Baycol [Cerivastatin] Other (See Comments)    Patient states it "about killed me" kidney shut down. Black stools.  . Stadol [Butorphanol Tartrate] Other (See Comments)    "makes him crazy"    FAMILY HISTORY:  family history includes Arthritis in his mother; Dementia in his mother; Heart disease in his brother and father. SOCIAL HISTORY:  reports that he quit smoking about 31 years ago. He started smoking about 57 years ago. He has never used smokeless tobacco. He reports that he does not drink  alcohol or use illicit drugs.  REVIEW OF SYSTEMS:  Constitutional: negative for anorexia, fevers and sweats  Eyes: negative for irritation, redness and visual disturbance  Ears, nose, mouth, throat, and face: negative for earaches, epistaxis, nasal congestion and sore throat  Respiratory: negative for cough, dyspnea on exertion, sputum and wheezing  Cardiovascular: negative for chest pain, dyspnea, lower extremity edema, orthopnea, palpitations and syncope  Gastrointestinal: negative for abdominal pain, constipation, diarrhea, melena, nausea and vomiting  Genitourinary:negative for dysuria, frequency and hematuria  Hematologic/lymphatic: negative for bleeding, easy bruising and lymphadenopathy  Musculoskeletal:negative for arthralgias, muscle weakness and stiff joints  -plaints of hip pain Neurological: negative for coordination problems, gait problems, headaches and weakness  Endocrine: negative for diabetic symptoms including polydipsia, polyuria and weight loss   Bolds are positive  Constitutional: weight loss, gain, night sweats, Fevers, chills, fatigue .  HEENT: headaches, Sore throat, sneezing, nasal congestion, post nasal drip, Difficulty swallowing, Tooth/dental problems, visual complaints visual changes, ear ache CV:  chest pain, radiates: ,Orthopnea, PND, swelling in lower extremities, dizziness, palpitations, syncope.  GI  heartburn, indigestion, abdominal pain, nausea, vomiting, diarrhea, change in bowel habits, loss of appetite, bloody stools.  Resp: cough, productive: , hemoptysis, dyspnea, chest pain, pleuritic.  Skin: rash or itching or icterus GU: dysuria, change in color of urine, urgency or frequency. flank pain, hematuria  MS: joint pain or swelling. decreased range of motion  Psych: change in mood or affect. depression or anxiety.  Neuro: difficulty with speech, weakness, numbness, ataxia   SUBJECTIVE:   VITAL SIGNS: Temp:  [97.7 F (36.5 C)-98.7 F (37.1 C)] 98.7 F (37.1 C) (03/18 0018) Pulse Rate:  [105-119] 119 (03/18 0018) Resp:  [18-118] 118 (03/18 0018) BP: (104-127)/(68-92) 116/92 mmHg (03/18 0018) SpO2:  [92 %-99 %] 99 % (03/18 0018) Weight:  [104.327 kg (230 lb)] 104.327 kg (230 lb) (03/17 2104)  PHYSICAL EXAMINATION: General:  Acutely ill, lying supine in bed Neuro:  Alert and interactive non-focal HEENT:  No JVD or carotid bruits Cardiovascular:  S1-S2 normal Lungs:  Fine bibasal crackles nor rhonchi Abdomen:  Soft and nontender Musculoskeletal:  Left hip tender, outward rotation Skin:  No rash   Recent Labs Lab 06/23/14 2241  NA 138  K 4.2  CL 101  CO2 28  BUN 14  CREATININE 0.91  GLUCOSE 208*    Recent Labs Lab 06/23/14 2241  HGB 10.7*  HCT 31.8*  WBC 9.6  PLT 182   Dg Ribs Unilateral  W/chest Left  06/23/2014   CLINICAL DATA:  Fall 3 days ago. Flank pain. Left hip pain. Left rib pain.  EXAM: LEFT RIBS AND CHEST - 3+ VIEW  COMPARISON:  06/04/2014  FINDINGS: No fracture or other bone lesions are seen involving the ribs. There is no evidence of pneumothorax or pleural effusion. There is stable cardiomegaly. Prior CABG. Bilateral diffuse interstitial thickening, likely chronic.  IMPRESSION: 1. No acute osseous injury of the left ribs. 2. Bilateral diffuse chronic interstitial lung disease. Mild superimposed interstitial edema cannot be excluded.   Electronically Signed   By: Elige Ko   On: 06/23/2014 22:31   Ct Head Wo Contrast  06/24/2014   CLINICAL DATA:  Larey Seat on Tuesday, fracturing left hip.  EXAM: CT HEAD WITHOUT CONTRAST  TECHNIQUE: Contiguous axial images were obtained from the base of the skull through the vertex without intravenous contrast.  COMPARISON:  04/01/2008  FINDINGS: There is no intracranial hemorrhage, mass or evidence of acute infarction. There is a 2.0 x  3.5 cm CSF density abnormality in the inferior right basal ganglia, perhaps a very large perivascular space. There is a 7 mm enlarged perivascular space in the inferior left basal ganglia. These findings are unchanged from 04/01/2008. There is mild generalized atrophy and mild white matter hypodensity which may represent chronic small vessel ischemic disease. Calvarium and skullbase are intact.  IMPRESSION: No evidence of acute intracranial traumatic injury. Unchanged CSF density abnormalities in the inferior basal ganglia, probably very large perivascular spaces. Mild atrophy and chronic microvascular disease.   Electronically Signed   By: Ellery Plunkaniel R Mitchell M.D.   On: 06/24/2014 00:37   Dg Hip Unilat With Pelvis 2-3 Views Left  06/23/2014   CLINICAL DATA:  Larey SeatFell 3 days ago.  Unable to walk.  EXAM: LEFT HIP (WITH PELVIS) 2-3 VIEWS  COMPARISON:  None.  FINDINGS: There is a left femoral neck fracture with mild valgus  angulation. No bone lesion is evident. No other fractures are evident.  IMPRESSION: Left femoral neck fracture   Electronically Signed   By: Ellery Plunkaniel R Mitchell M.D.   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. He reports exposure to silica from tile grout -I doubt this is significant enough, hence doubt this diagnosis. PFTs are not available to comment on L femoral neck fracture  Discussion: Mr Maisie Fushomas appears to have a poor cardiopulmonary baseline. Any surgical intervention would place him at intermediate risk for post operative pulmonary complications including long-term requirement for mechanical ventilation. However the alternative of being bedbound is certainly not good. He is cleared with due risk. His only status seems to be at baseline. He is at high risk from a cardiac standpoint to He is maintained on chronic 10 mg of prednisone  - Scheduled brovana, budesonide - PRN duoneb - Stress dose steroids > methylprednisolone  -Hopefully, if uneventful surgery, he can be extubated rapidly and would not require prolonged ventilation.  Please consult us postoperatively if required His outpatient follow-up will be with Dr. [place]etta [place]kins in [place]  ALVA,RAKESH V. MD

## 2014-06-24 NOTE — H&P (Signed)
H&P update  The surgical history has been reviewed and remains accurate without interval change.  The patient was re-examined and patient's physiologic condition has not changed significantly in the last 30 days. The condition still exists that makes this procedure necessary. The treatment plan remains the same, without new options for care.  No new pharmacological allergies or types of therapy has been initiated that would change the plan or the appropriateness of the plan.  The patient and/or family understand the potential benefits and risks.  Mayra ReelN. Michael Korbin Notaro, MD 06/24/2014 8:36 PM

## 2014-06-24 NOTE — Consult Note (Signed)
ORTHOPAEDIC CONSULTATION  REQUESTING PHYSICIAN: Florencia Reasons, MD  Chief Complaint: Left hip fx  HPI: Timothy Trujillo is a 64 y.o. male who complains of left hip fx.  Mechanical fall on Monday and inability to weight bear.  Presented to ER yesterday and diagnosed with femoral neck fx.  Walks occasionally with walker at baseline and reports mild groin pain prior to fall.  Has significant CV and pulmonary comorbidities.  Ortho consulted for hip fx.  Past Medical History  Diagnosis Date  . Uncontrolled diabetes mellitus     a. A1C 12.8 in 01/2013.  [name] Chronic systolic heart failure   . Obesity   . Hyperlipidemia   . Peripheral vascular disease   . Hypertension   . Asthma   . CAD (coronary artery disease)     a. Prior CABG 20 yrs ago (?~1994). b. Hx of multiple stents, prev followed in [place]. c. [place] 01/2013: cath moderate disease in LAD, distal LCx, prox RCA occluded, 2 SVGs occluded, felt to be stable from prior cath -> for medical therapy.  . Ischemic cardiomyopathy     a. EF 2012: 55-60. b. EF 12/2011: 25-30%. c. Echo 01/2013: EF 15-20%.  . Stroke     15 years ago  . Arthritis   . Anxiety   . Depression   . 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 Chronic respiratory failure     a. Due to COPD.  . Osteomyelitis 2013    was planned for chronic suppressive antibiotics but cannot afford this any longer  . DNR no code (do not resuscitate) 07/2013  . CHF (congestive heart failure)   . Diabetes mellitus without complication    Past Surgical History  Procedure Laterality Date  . Coronary artery bypass graft    . Coronary stent placement  2008    CABG grafts closed  . Joint replacement    . Cholecystectomy    . Eye surgery    . I&d extremity  06/12/2011    Procedure: IRRIGATION AND DEBRIDEMENT EXTREMITY;  Surgeon: Johnn Hai, MD;  Location: [facility];  Service: Orthopedics;  Laterality: Right;  I&D Right [place]  . Left and right heart  catheterization with coronary/graft angiogram N/A 01/15/2013    Procedure: LEFT AND RIGHT HEART CATHETERIZATION WITH Beatrix Fetters;  Surgeon: Ramond Dial, MD;  Location: [facility] CATH LAB;  Service: Cardiovascular;  Laterality: N/A;   History   Social History  . Marital Status: Widowed    Spouse Name: N/A  . Number of Children: N/A  . Years of Education: N/A   Social History Main Topics  . Smoking status: Former Smoker -- 3.00 packs/day for 26 years    Start date: 04/08/1957    Quit date: 04/09/1983  . Smokeless tobacco: Never Used  . Alcohol Use: No  . Drug Use: No  . Sexual Activity: No   Other Topics Concern  . None   Social History Narrative   Pt lives in [place] alone.   Disabled   Previously worked as a Administrator   Family History  Problem Relation Age of Onset  . Dementia Mother   . Arthritis Mother   . Heart disease Father   . Heart disease Brother    Allergies  Allergen Reactions  . Baycol [Cerivastatin] Other (See Comments)    Patient states it "about killed me" kidney shut down. Black stools.  Elder Love [  Butorphanol Tartrate] Other (See Comments)    "makes him crazy"   Prior to Admission medications   Medication Sig Start Date End Date Taking? Authorizing Provider  acetaminophen (TYLENOL) 325 MG tablet Take 2 tablets (650 mg total) by mouth every 6 (six) hours as needed for mild pain (or Fever >/= 101). 06/09/14  Yes Sinda Du, MD  ALPRAZolam Duanne Moron) 0.25 MG tablet Take 1 tablet (0.25 mg total) by mouth 2 (two) times daily as needed for anxiety. 05/10/14  Yes Sinda Du, MD  aspirin 325 MG tablet Take 325 mg by mouth daily.   Yes Historical Provider, MD  atorvastatin (LIPITOR) 80 MG tablet Take 1 tablet (80 mg total) by mouth daily. 06/09/14  Yes Sinda Du, MD  budesonide (PULMICORT) 0.25 MG/2ML nebulizer solution Take 2 mLs (0.25 mg total) by nebulization 2 (two) times daily. 06/09/14  Yes Sinda Du, MD  carvedilol (COREG) 6.25 MG  tablet Take 1 tablet (6.25 mg total) by mouth 2 (two) times daily. 04/25/14  Yes Lendon Colonel, NP  clopidogrel (PLAVIX) 75 MG tablet Take 1 tablet (75 mg total) by mouth daily. 04/25/14  Yes Lendon Colonel, NP  digoxin (LANOXIN) 0.25 MG tablet Take 1 tablet (0.25 mg total) by mouth daily. 06/09/14  Yes Sinda Du, MD  diphenhydrAMINE (BENADRYL) 50 MG tablet Take 1 tablet (50 mg total) by mouth every 6 (six) hours as needed for itching. 05/10/14  Yes Sinda Du, MD  feeding supplement, ENSURE COMPLETE, (ENSURE COMPLETE) LIQD Take 237 mLs by mouth 2 (two) times daily between meals. 06/09/14  Yes Sinda Du, MD  hydrocortisone 2.5 % cream Apply 1 application topically daily as needed (itching).  04/25/14  Yes Historical Provider, MD  insulin NPH Human (HUMULIN N,NOVOLIN N) 100 UNIT/ML injection Inject 0.4 mLs (40 Units total) into the skin daily before breakfast. Patient taking differently: Inject 110 Units into the skin 2 (two) times daily.  06/09/14  Yes Sinda Du, MD  isosorbide mononitrate (IMDUR) 60 MG 24 hr tablet Take 1 tablet (60 mg total) by mouth daily. 04/25/14  Yes Lendon Colonel, NP  nitroGLYCERIN (NITROSTAT) 0.4 MG SL tablet Place 1 tablet (0.4 mg total) under the tongue every 5 (five) minutes as needed for chest pain. 04/25/14  Yes Lendon Colonel, NP  ondansetron (ZOFRAN) 4 MG tablet Take 1 tablet (4 mg total) by mouth every 6 (six) hours as needed for nausea. 06/09/14  Yes Sinda Du, MD  oxyCODONE-acetaminophen (PERCOCET) 10-325 MG per tablet Take 1 tablet by mouth every 4 (four) hours as needed for pain.   Yes Historical Provider, MD  polyethylene glycol (MIRALAX / GLYCOLAX) packet Take 17 g by mouth daily. 06/09/14  Yes Sinda Du, MD  potassium chloride SA (K-DUR,KLOR-CON) 20 MEQ tablet Take 1 tablet (20 mEq total) by mouth 2 (two) times daily. 06/09/14  Yes Sinda Du, MD  predniSONE (DELTASONE) 10 MG tablet Take 1 tablet (10 mg total) by mouth daily with  breakfast. 06/09/14  Yes Sinda Du, MD  sertraline (ZOLOFT) 50 MG tablet Take 1 tablet (50 mg total) by mouth daily. 05/10/14  Yes Sinda Du, MD  torsemide (DEMADEX) 20 MG tablet Take 1 tablet (20 mg total) by mouth 2 (two) times daily. 06/09/14  Yes Sinda Du, MD  vitamin B-12 (CYANOCOBALAMIN) 1000 MCG tablet Take 1,000 mcg by mouth daily.   Yes Historical Provider, MD  vitamin C (ASCORBIC ACID) 500 MG tablet Take 1,000 mg by mouth at bedtime.   Yes Historical Provider,  MD  vitamin E 400 UNIT capsule Take 400 Units by mouth at bedtime.    Yes Historical Provider, MD  insulin aspart (NOVOLOG) 100 UNIT/ML injection Inject 0-15 Units into the skin 3 (three) times daily with meals. Patient not taking: Reported on 06/23/2014 06/09/14   Sinda Du, MD  insulin aspart (NOVOLOG) 100 UNIT/ML injection Inject 0-5 Units into the skin at bedtime. Patient not taking: Reported on 06/23/2014 06/09/14   Sinda Du, MD  insulin NPH Human (HUMULIN N,NOVOLIN N) 100 UNIT/ML injection Inject 0.2 mLs (20 Units total) into the skin at bedtime. Patient not taking: Reported on 06/23/2014 06/09/14   Sinda Du, MD  levalbuterol Penne Lash) 0.63 MG/3ML nebulizer solution Take 3 mLs (0.63 mg total) by nebulization every 6 (six) hours as needed for wheezing or shortness of breath. 06/09/14   Sinda Du, MD   Dg Ribs Unilateral W/chest Left  06/23/2014   CLINICAL DATA:  Fall 3 days ago. Flank pain. Left hip pain. Left rib pain.  EXAM: LEFT RIBS AND CHEST - 3+ VIEW  COMPARISON:  06/04/2014  FINDINGS: No fracture or other bone lesions are seen involving the ribs. There is no evidence of pneumothorax or pleural effusion. There is stable cardiomegaly. Prior CABG. Bilateral diffuse interstitial thickening, likely chronic.  IMPRESSION: 1. No acute osseous injury of the left ribs. 2. Bilateral diffuse chronic interstitial lung disease. Mild superimposed interstitial edema cannot be excluded.   Electronically Signed   By:  Kathreen Devoid   On: 06/23/2014 22:31   Ct Head Wo Contrast  06/24/2014   CLINICAL DATA:  [place] on Tuesday, fracturing left hip.  EXAM: CT HEAD WITHOUT CONTRAST  TECHNIQUE: Contiguous axial images were obtained from the base of the skull through the vertex without intravenous contrast.  COMPARISON:  04/01/2008  FINDINGS: There is no intracranial hemorrhage, mass or evidence of acute infarction. There is a 2.0 x 3.5 cm CSF density abnormality in the inferior right basal ganglia, perhaps a very large perivascular space. There is a 7 mm enlarged perivascular space in the inferior left basal ganglia. These findings are unchanged from 04/01/2008. There is mild generalized atrophy and mild white matter hypodensity which may represent chronic small vessel ischemic disease. Calvarium and skullbase are intact.  IMPRESSION: No evidence of acute intracranial traumatic injury. Unchanged CSF density abnormalities in the inferior basal ganglia, probably very large perivascular spaces. Mild atrophy and chronic microvascular disease.   Electronically Signed   By: Andreas Newport M.D.   On: 06/24/2014 00:37   Dg Hip Unilat With Pelvis 2-3 Views Left  06/23/2014   CLINICAL DATA:  [place] 3 days ago.  Unable to walk.  EXAM: LEFT HIP (WITH PELVIS) 2-3 VIEWS  COMPARISON:  None.  FINDINGS: There is a left femoral neck fracture with mild valgus angulation. No bone lesion is evident. No other fractures are evident.  IMPRESSION: Left femoral neck fracture   Electronically Signed   By: Andreas Newport M.D.   On: 06/23/2014 22:31    Positive ROS: All other systems have been reviewed and were otherwise negative with the exception of those mentioned in the HPI and as above.  Physical Exam: General: Alert, no acute distress Cardiovascular: No pedal edema Respiratory: No cyanosis, no use of accessory musculature GI: No organomegaly, abdomen is soft and non-tender Skin: No lesions in the area of chief complaint Neurologic: Sensation  intact distally Psychiatric: Patient is competent for consent with normal mood and affect Lymphatic: No axillary or cervical lymphadenopathy  MUSCULOSKELETAL:  -  skin intact - NVI distally - foot wwp - painful ROM of hip  Assessment: Left femoral neck fx  Plan: - agree with cards and pulm consult for preop clearance - patient would benefit from THA - plan for surgery tomorrow - aspirin stopped - consent obtained - Based on history and fracture pattern this likely represents a fragility fracture. - Fragility fractures affect up to one half of women and one third of men after age 74 years and occur in the setting of bone disorder such as osteoporosis or osteopenia and warrant appropriate work-up. - The following are general recommendations that may serve as an outline for an appropriate work-up:  1.) Obtain bone density measurement to confirm presumptive diagnosis, assess severity of osteoporosis and risk of future fracture, and use as baseline for monitoring treatment  2.) Obtain laboratory tests: CBC, ESR, serum calcium, creatinine, albumin,phosphate, alkaline phosphatase, liver transaminases, protein electrophoresis, urinalysis, 25-hydroxyvitamin D.  3.) Exclude secondary causes of low bone mass and skeletal fragility (eg,multiple myeloma, lymphoma) as indicated.  4.) Obtain radiograph of thoracic and lumbar spine, particularly among individuals with back pain or height loss to assess presence of vertebral fractures  5.) Intermittent administration of recombinant human parathyroid hormone  6.) Optimize nutritional status using nutritional supplementation.  7.) Patient/family education to prevent future falls.  8.) Early mobilization and exercise program - exercise decreases the rate of bone loss and has been associated with decreased rate of fragility fractures   Thank you for the consult and the opportunity to see [name]. Eduard Roux, MD [place] 7:54 AM     t

## 2014-06-25 ENCOUNTER — Inpatient Hospital Stay (HOSPITAL_COMMUNITY): Payer: Commercial Managed Care - HMO | Admitting: Certified Registered Nurse Anesthetist

## 2014-06-25 ENCOUNTER — Encounter (HOSPITAL_COMMUNITY): Payer: Self-pay | Admitting: Anesthesiology

## 2014-06-25 ENCOUNTER — Inpatient Hospital Stay (HOSPITAL_COMMUNITY): Payer: Commercial Managed Care - HMO

## 2014-06-25 ENCOUNTER — Encounter (HOSPITAL_COMMUNITY)
Admission: EM | Disposition: A | Discharge status: Home Health | Payer: Self-pay | Source: Home / Self Care | Attending: Internal Medicine

## 2014-06-25 DIAGNOSIS — E43 Unspecified severe protein-calorie malnutrition: Secondary | ICD-10-CM

## 2014-06-25 HISTORY — PX: HIP PINNING,CANNULATED: SHX1758

## 2014-06-25 LAB — CBC
HEMATOCRIT: 32.8 % — AB (ref 39.0–52.0)
Hemoglobin: 10.9 g/dL — ABNORMAL LOW (ref 13.0–17.0)
MCH: 28.5 pg (ref 26.0–34.0)
MCHC: 33.2 g/dL (ref 30.0–36.0)
MCV: 85.6 fL (ref 78.0–100.0)
Platelets: 220 10*3/uL (ref 150–400)
RBC: 3.83 MIL/uL — AB (ref 4.22–5.81)
RDW: 13.7 % (ref 11.5–15.5)
WBC: 11 10*3/uL — ABNORMAL HIGH (ref 4.0–10.5)

## 2014-06-25 LAB — GLUCOSE, CAPILLARY
GLUCOSE-CAPILLARY: 158 mg/dL — AB (ref 70–99)
Glucose-Capillary: 180 mg/dL — ABNORMAL HIGH (ref 70–99)
Glucose-Capillary: 200 mg/dL — ABNORMAL HIGH (ref 70–99)
Glucose-Capillary: 227 mg/dL — ABNORMAL HIGH (ref 70–99)
Glucose-Capillary: 239 mg/dL — ABNORMAL HIGH (ref 70–99)
Glucose-Capillary: 333 mg/dL — ABNORMAL HIGH (ref 70–99)
Glucose-Capillary: 369 mg/dL — ABNORMAL HIGH (ref 70–99)

## 2014-06-25 LAB — BASIC METABOLIC PANEL
Anion gap: 8 (ref 5–15)
BUN: 16 mg/dL (ref 6–23)
CO2: 30 mmol/L (ref 19–32)
Calcium: 9.3 mg/dL (ref 8.4–10.5)
Chloride: 97 mmol/L (ref 96–112)
Creatinine, Ser: 0.91 mg/dL (ref 0.50–1.35)
GFR calc non Af Amer: 87 mL/min — ABNORMAL LOW (ref >90)
GLUCOSE: 190 mg/dL — AB (ref 70–99)
Potassium: 3.5 mmol/L (ref 3.5–5.1)
SODIUM: 135 mmol/L (ref 135–145)

## 2014-06-25 SURGERY — FIXATION, FEMUR, NECK, PERCUTANEOUS, USING SCREW
Anesthesia: General | Laterality: Left

## 2014-06-25 MED ORDER — METHOCARBAMOL 500 MG PO TABS
500.0000 mg | ORAL_TABLET | Freq: Four times a day (QID) | ORAL | Status: DC | PRN
  Administered 2014-06-25 – 2014-06-27 (×5): 500 mg via ORAL
  Filled 2014-06-25 (×5): qty 1

## 2014-06-25 MED ORDER — METOCLOPRAMIDE HCL 5 MG/ML IJ SOLN: 5.0000 mg | Freq: Three times a day (TID) | INTRAMUSCULAR | Status: DC | PRN

## 2014-06-25 MED ORDER — FENTANYL CITRATE 0.05 MG/ML IJ SOLN
INTRAMUSCULAR | Status: AC
  Filled 2014-06-25: qty 5

## 2014-06-25 MED ORDER — METHYLPREDNISOLONE SODIUM SUCC 125 MG IJ SOLR
INTRAMUSCULAR | Status: DC | PRN
  Administered 2014-06-25: 125 mg via INTRAVENOUS

## 2014-06-25 MED ORDER — ACETAMINOPHEN 650 MG RE SUPP: 650.0000 mg | Freq: Four times a day (QID) | RECTAL | Status: DC | PRN

## 2014-06-25 MED ORDER — MIDAZOLAM HCL 2 MG/2ML IJ SOLN
INTRAMUSCULAR | Status: AC
  Filled 2014-06-25: qty 2

## 2014-06-25 MED ORDER — CEFAZOLIN SODIUM-DEXTROSE 2-3 GM-% IV SOLR
INTRAVENOUS | Status: AC
  Filled 2014-06-25: qty 50

## 2014-06-25 MED ORDER — HYDROCODONE-ACETAMINOPHEN 5-325 MG PO TABS
1.0000 | ORAL_TABLET | Freq: Four times a day (QID) | ORAL | Status: DC | PRN
Start: 2014-06-25 — End: 2014-06-27
  Administered 2014-06-25 – 2014-06-26 (×3): 2 via ORAL
  Filled 2014-06-25 (×3): qty 2

## 2014-06-25 MED ORDER — MORPHINE SULFATE 2 MG/ML IJ SOLN
0.5000 mg | INTRAMUSCULAR | Status: DC | PRN
  Administered 2014-06-25 – 2014-06-27 (×3): 0.5 mg via INTRAVENOUS
  Filled 2014-06-25 (×2): qty 1

## 2014-06-25 MED ORDER — FENTANYL CITRATE 0.05 MG/ML IJ SOLN: 25.0000 ug | INTRAMUSCULAR | Status: DC | PRN

## 2014-06-25 MED ORDER — CEFAZOLIN SODIUM-DEXTROSE 2-3 GM-% IV SOLR
2.0000 g | Freq: Four times a day (QID) | INTRAVENOUS | Status: AC
  Administered 2014-06-25 – 2014-06-26 (×3): 2 g via INTRAVENOUS
  Filled 2014-06-25 (×3): qty 50

## 2014-06-25 MED ORDER — OXYCODONE HCL 5 MG PO TABS: 5.0000 mg | ORAL_TABLET | Freq: Once | ORAL | Status: DC | PRN

## 2014-06-25 MED ORDER — MIDAZOLAM HCL 5 MG/5ML IJ SOLN
INTRAMUSCULAR | Status: DC | PRN
  Administered 2014-06-25 (×2): 1 mg via INTRAVENOUS

## 2014-06-25 MED ORDER — OXYCODONE HCL 5 MG PO TABS: 5.0000 mg | ORAL_TABLET | ORAL | Status: DC | PRN

## 2014-06-25 MED ORDER — OXYCODONE HCL 5 MG/5ML PO SOLN: 5.0000 mg | Freq: Once | ORAL | Status: DC | PRN

## 2014-06-25 MED ORDER — ENOXAPARIN SODIUM 40 MG/0.4ML ~~LOC~~ SOLN: 40.0000 mg | Freq: Every day | SUBCUTANEOUS | Status: DC

## 2014-06-25 MED ORDER — ALUM & MAG HYDROXIDE-SIMETH 200-200-20 MG/5ML PO SUSP: 30.0000 mL | ORAL | Status: DC | PRN

## 2014-06-25 MED ORDER — PHENYLEPHRINE HCL 10 MG/ML IJ SOLN
10.0000 mg | INTRAMUSCULAR | Status: DC | PRN
  Administered 2014-06-25: 15 ug/min via INTRAVENOUS

## 2014-06-25 MED ORDER — FENTANYL CITRATE 0.05 MG/ML IJ SOLN
INTRAMUSCULAR | Status: DC | PRN
  Administered 2014-06-25 (×5): 50 ug via INTRAVENOUS

## 2014-06-25 MED ORDER — METOCLOPRAMIDE HCL 10 MG PO TABS: 5.0000 mg | ORAL_TABLET | Freq: Three times a day (TID) | ORAL | Status: DC | PRN

## 2014-06-25 MED ORDER — METHOCARBAMOL 1000 MG/10ML IJ SOLN
500.0000 mg | Freq: Four times a day (QID) | INTRAVENOUS | Status: DC | PRN
  Filled 2014-06-25: qty 5

## 2014-06-25 MED ORDER — MENTHOL 3 MG MT LOZG: 1.0000 | LOZENGE | OROMUCOSAL | Status: DC | PRN

## 2014-06-25 MED ORDER — ONDANSETRON HCL 4 MG/2ML IJ SOLN: 4.0000 mg | Freq: Four times a day (QID) | INTRAMUSCULAR | Status: DC | PRN

## 2014-06-25 MED ORDER — ENOXAPARIN SODIUM 40 MG/0.4ML ~~LOC~~ SOLN
40.0000 mg | SUBCUTANEOUS | Status: DC
  Administered 2014-06-26 – 2014-06-27 (×2): 40 mg via SUBCUTANEOUS
  Filled 2014-06-25 (×2): qty 0.4

## 2014-06-25 MED ORDER — ACETAMINOPHEN 325 MG PO TABS
650.0000 mg | ORAL_TABLET | Freq: Four times a day (QID) | ORAL | Status: DC | PRN
Start: 2014-06-25 — End: 2014-06-27

## 2014-06-25 MED ORDER — ONDANSETRON HCL 4 MG/2ML IJ SOLN
INTRAMUSCULAR | Status: AC
  Filled 2014-06-25: qty 2

## 2014-06-25 MED ORDER — LACTATED RINGERS IV SOLN
INTRAVENOUS | Status: DC | PRN
  Administered 2014-06-25: 09:00:00 via INTRAVENOUS

## 2014-06-25 MED ORDER — LIDOCAINE HCL (CARDIAC) 20 MG/ML IV SOLN
INTRAVENOUS | Status: DC | PRN
  Administered 2014-06-25: 80 mg via INTRAVENOUS

## 2014-06-25 MED ORDER — CEFAZOLIN SODIUM-DEXTROSE 2-3 GM-% IV SOLR
INTRAVENOUS | Status: DC | PRN
  Administered 2014-06-25: 2 g via INTRAVENOUS

## 2014-06-25 MED ORDER — PHENOL 1.4 % MT LIQD: 1.0000 | OROMUCOSAL | Status: DC | PRN

## 2014-06-25 MED ORDER — SODIUM CHLORIDE 0.9 % IV SOLN
INTRAVENOUS | Status: DC
Start: 2014-06-25 — End: 2014-06-25
  Administered 2014-06-25: 12:00:00 via INTRAVENOUS

## 2014-06-25 MED ORDER — ONDANSETRON HCL 4 MG PO TABS: 4.0000 mg | ORAL_TABLET | Freq: Four times a day (QID) | ORAL | Status: DC | PRN

## 2014-06-25 MED ORDER — ETOMIDATE 2 MG/ML IV SOLN
INTRAVENOUS | Status: DC | PRN
  Administered 2014-06-25: 20 mg via INTRAVENOUS

## 2014-06-25 SURGICAL SUPPLY — 34 items
BIT DRILL 5.0 QC 6.5 (BIT) ×2 IMPLANT
BIT DRILL 5.0 QC 6.5MM (BIT) ×1
BNDG COHESIVE 4X5 TAN STRL (GAUZE/BANDAGES/DRESSINGS) ×6 IMPLANT
BNDG GAUZE ELAST 4 BULKY (GAUZE/BANDAGES/DRESSINGS) ×3 IMPLANT
COVER PERINEAL POST (MISCELLANEOUS) ×3 IMPLANT
DRAPE STERI IOBAN 125X83 (DRAPES) ×3 IMPLANT
DRSG ADAPTIC 3X8 NADH LF (GAUZE/BANDAGES/DRESSINGS) ×3 IMPLANT
DRSG MEPILEX BORDER 4X4 (GAUZE/BANDAGES/DRESSINGS) ×3 IMPLANT
DURAPREP 26ML APPLICATOR (WOUND CARE) ×3 IMPLANT
ELECT CAUTERY BLADE 6.4 (BLADE) ×3 IMPLANT
ELECT REM PT RETURN 9FT ADLT (ELECTROSURGICAL) ×3
ELECTRODE REM PT RTRN 9FT ADLT (ELECTROSURGICAL) ×1 IMPLANT
GLOVE NEODERM STRL 7.5 LF PF (GLOVE) ×2 IMPLANT
GLOVE SURG NEODERM 7.5  LF PF (GLOVE) ×4
GOWN STRL REIN XL XLG (GOWN DISPOSABLE) ×9 IMPLANT
KIT BASIN OR (CUSTOM PROCEDURE TRAY) ×3 IMPLANT
KIT ROOM TURNOVER OR (KITS) ×3 IMPLANT
LINER BOOT UNIVERSAL DISP (MISCELLANEOUS) ×3 IMPLANT
MANIFOLD NEPTUNE II (INSTRUMENTS) IMPLANT
NS IRRIG 1000ML POUR BTL (IV SOLUTION) ×3 IMPLANT
PACK GENERAL/GYN (CUSTOM PROCEDURE TRAY) ×3 IMPLANT
PAD ARMBOARD 7.5X6 YLW CONV (MISCELLANEOUS) ×6 IMPLANT
PIN GUIDE 3.2X300MM (PIN) ×9 IMPLANT
SCREW CANN 7.0X90 (Screw) ×3 IMPLANT
SCREW FULL THREAD 8.0X95 (Screw) ×3 IMPLANT
SCREW PARTIAL THREAD 7.0X95 (Screw) ×3 IMPLANT
STAPLER VISISTAT 35W (STAPLE) IMPLANT
SUT VIC AB 0 CT1 27 (SUTURE) ×2
SUT VIC AB 0 CT1 27XBRD ANBCTR (SUTURE) ×1 IMPLANT
SUT VIC AB 2-0 CT1 27 (SUTURE) ×2
SUT VIC AB 2-0 CT1 TAPERPNT 27 (SUTURE) ×1 IMPLANT
TOWEL OR 17X24 6PK STRL BLUE (TOWEL DISPOSABLE) ×3 IMPLANT
TOWEL OR 17X26 10 PK STRL BLUE (TOWEL DISPOSABLE) ×3 IMPLANT
WATER STERILE IRR 1000ML POUR (IV SOLUTION) IMPLANT

## 2014-06-25 NOTE — Progress Notes (Signed)
PROGRESS NOTE  Timothy Trujillo NFA:[id number] DOB: January 10, 1951 DOA: 06/23/2014 PCP: Fredirick Maudlin, MD  HPI/Recap of past 24 hours: Returned from OR, no complaints, grandson in room  Assessment/Plan: Principal Problem:   Femoral neck fracture Active Problems:   Ischemic cardiomyopathy   COPD, severe  O2 dependent   Diabetes mellitus type 2, controlled   Hip fracture   Preop cardiovascular exam   Protein-calorie malnutrition, severe  Left hip fracture status post fall  - patient aware of  high risk for surgery given his cardiomyopathy CAD status post CABG and severe COPD. Patient however wish to have surgery. pulmonary and cardiology consults prior to surgery ORIF left hip 3/19, ortho input appreciated.  CAD status post CABG/chronic sinus tachycardia/chronic CHF, last known lvef 25% - denies any chest pain. Continue home meds. Currently euvolemic. In chronic afib, rate stable. D/c ivf. Cards following.  Severe COPD  - On home oxygen 3liter - presently not wheezing. - on chronic steroids at home, IV steroids and stress dose here.  Continue nebulizer.  Diabetes mellitus type 2 uncontrolled - a1c 10.6 (04/2014), repeat a1c pending Adjust insulin prn . H/o Anemia - stable at baseline   DVT Prophylaxis lovenox Code Status: DO NOT RESUSCITATE.  Family Communication: patient and grandson  Disposition Plan: remain inpatient   Consultants:  Ortho  Cards  pulmnology  Procedures: Open treatment of proximal end of femur, neck with internal fixation left hip 3/19  Antibiotics:  Perioperative ancef   Objective: BP 133/59 mmHg  Pulse 103  Temp(Src) 98.2 F (36.8 C) (Oral)  Resp 18  Ht  (1.905 m)  Wt 104.327 kg (230 lb)  BMI 28.75 kg/m2  SpO2 98%  Intake/Output Summary (Last 24 hours) at 06/25/14 1926 Last data filed at 06/25/14 1853  Gross per 24 hour  Intake    520 ml  Output   1625 ml  Net  -1105 ml   Filed Weights   06/23/14 2104  Weight:  104.327 kg (230 lb)    Exam:   General:  AAOx3, NAD  Cardiovascular: tachycardia  Respiratory: CTABL  Abdomen: soft/ND/ND, positive bowel sounds  Musculoskeletal: no edema, post op changes left leg.  Data Reviewed: Basic Metabolic Panel:  Recent Labs Lab 06/23/14 2241 06/24/14 0335 06/25/14 0530  NA 138 140 135  K 4.2 4.0 3.5  CL 101 102 97  CO2 GLUCOSE 208* 181* 190*  BUN CREATININE 0.91 0.81 0.91  CALCIUM 8.8 9.0 9.3   Liver Function Tests:  Recent Labs Lab 06/23/14 2241 06/24/14 0335  AST 15 11  ALT 10 11  ALKPHOS 64 63  BILITOT 0.6 0.6  PROT 5.9* 5.9*  ALBUMIN 2.9* 2.9*   No results for input(s): LIPASE, AMYLASE in the last 168 hours. No results for input(s): AMMONIA in the last 168 hours. CBC:  Recent Labs Lab 06/23/14 2241 06/24/14 0335 06/25/14 0530  WBC 9.6 8.3 11.0*  NEUTROABS 7.5 6.5  --   HGB 10.7* 10.6* 10.9*  HCT 31.8* 31.6* 32.8*  MCV 85.5 85.9 85.6  PLT 182 195 220   Cardiac Enzymes:    Recent Labs Lab 06/24/14 0335  TROPONINI <0.03   BNP (last 3 results)  Recent Labs  05/06/14 2355 06/04/14 0325  BNP 646.0* 531.0*    ProBNP (last 3 results)  Recent Labs  07/30/13 1523 02/25/14 2200  PROBNP 604.2* 1014.0*    CBG:  Recent Labs Lab 06/25/14 0402 06/25/14 0818 06/25/14 1038  06/25/14 1215 06/25/14 1703  GLUCAP 227* 158* 180* 200* 333*    Recent Results (from the past 240 hour(s))  Surgical pcr screen     Status: None   Collection Time: 06/24/14  6:30 PM  Result Value Ref Range Status   MRSA, PCR NEGATIVE NEGATIVE Final   Staphylococcus aureus NEGATIVE NEGATIVE Final    Comment:        The Xpert SA Assay (FDA approved for NASAL specimens in patients over 66 years of age), is one component of a comprehensive surveillance program.  Test performance has been validated by [facility] for patients greater than or equal to 66 year old. It is not intended to diagnose infection nor  to guide or monitor treatment.      Studies: Ct Hip Left Wo Contrast  06/24/2014   CLINICAL DATA:  Status post fall 06/20/2014. The patient presented to the emergency room 06/23/2014 with pain and inability to bear weight fracture by plain films. Initial encounter.  EXAM: CT OF THE LEFT HIP WITHOUT CONTRAST  TECHNIQUE: Multidetector CT imaging of the left hip was performed according to the standard protocol. Multiplanar CT image reconstructions were also generated.  COMPARISON:  Plain films left hip 06/23/2014.  FINDINGS: As seen on the patient's plain films, there is a minimally impacted fracture of the left femoral neck. The fracture extends from a subcapital position superiorly in an inferior and oblique orientation through the mid aspect of the femoral neck. No other fracture is identified. No focal bony lesion is seen. No notable degenerative change is present about the right hip. Visualized lower lumbar spine is unremarkable. Imaged intrapelvic contents demonstrate aortoiliac atherosclerosis but are otherwise unremarkable.  IMPRESSION: Acute appearing, minimally impacted left femoral neck fracture.   Electronically Signed   By: Drusilla Kannerhomas  Dalessio M.D.   On: 06/24/2014 13:51    Scheduled Meds: . atorvastatin  80 mg Oral Daily  . budesonide  0.25 mg Nebulization BID  . carvedilol  6.25 mg Oral BID WC  .  ceFAZolin (ANCEF) IV  2 g Intravenous Q6H  . digoxin  0.25 mg Oral Daily  . [START ON 06/26/2014] enoxaparin (LOVENOX) injection  40 mg Subcutaneous Q24H  . feeding supplement (ENSURE COMPLETE)  237 mL Oral Q1500  . feeding supplement (GLUCERNA SHAKE)  237 mL Oral BID BM  . insulin aspart  0-9 Units Subcutaneous Q4H  . insulin NPH Human  10 Units Subcutaneous BID AC & HS  . isosorbide mononitrate  60 mg Oral Daily  . methylPREDNISolone (SOLU-MEDROL) injection  40 mg Intravenous Daily  . nystatin ointment   Topical BID  . polyethylene glycol  17 g Oral Daily  . sertraline  50 mg Oral Daily    . torsemide  20 mg Oral BID  . vitamin B-12  1,000 mcg Oral Daily    Continuous Infusions:     Emilynn Srinivasan  [facility] Pager [phone]. If 7PM-7AM, please contact night-coverage at [email or web address], password [email or web address] 06/25/2014, 7:26 PM  LOS: 2 days

## 2014-06-25 NOTE — Anesthesia Preprocedure Evaluation (Signed)
Anesthesia Evaluation  Patient identified by MRN, date of birth, ID band Patient awake    Reviewed: Allergy & Precautions, NPO status , Patient's Chart, lab work & pertinent test results  Airway Mallampati: II   Neck ROM: full    Dental   Pulmonary asthma , COPDformer smoker,  Pt has interstitial lung disease.  On chronic steroids.  Pulmonary MD recommends stress dose of steroids perioperatively.  Pt on 3L [place] at all times.  Increases to 4L [place] with activity.         Cardiovascular hypertension, + angina + CAD, + CABG, + Peripheral Vascular Disease and +CHF  Pt has occluded coronary grafts.  EF 20%.  Cardiology states that he is very high risk for cardiac complications with surgery.   Neuro/Psych Anxiety Depression CVA    GI/Hepatic   Endo/Other  diabetes, Type 2obese  Renal/GU      Musculoskeletal  (+) Arthritis -,   Abdominal   Peds  Hematology   Anesthesia Other Findings   Reproductive/Obstetrics                             Anesthesia Physical Anesthesia Plan  ASA: IV  Anesthesia Plan: General   Post-op Pain Management:    Induction: Intravenous  Airway Management Planned: LMA  Additional Equipment: Arterial line  Intra-op Plan:   Post-operative Plan: Possible Post-op intubation/ventilation  Informed Consent: I have reviewed the patients History and Physical, chart, labs and discussed the procedure including the risks, benefits and alternatives for the proposed anesthesia with the patient or authorized representative who has indicated his/her understanding and acceptance.     Plan Discussed with: CRNA, Anesthesiologist and Surgeon  Anesthesia Plan Comments: (Risks of anesthesia were discussed with the patient at length.  Both the critical care and cardiology doctors have indicated in their notes and to the patient that he is at a very high risk of complications (including death)  associated with having anesthesia and surgery.  The patient says that he and his family understand this risk and wish to proceed with surgery.  The patient wishes to suspend his DNR order perioperatively.  If necessary, we will provide full recussitative measures.  )        Anesthesia Quick Evaluation

## 2014-06-25 NOTE — Op Note (Signed)
   Date of Surgery: 06/25/2014  INDICATIONS: [name] is a 64 y.o.-year-old male who sustained a hip fracture; he was indicated for open reduction and internal fixation due to the displaced nature of the fracture and came to the operating room today for this procedure. The patient did consent to the procedure after discussion of the risks and benefits.   PREOPERATIVE DIAGNOSIS: left hip fracture  POSTOPERATIVE DIAGNOSIS: Same.  PROCEDURE: Open treatment of proximal end of femur, neck with internal fixation. CPT [id number].  SURGEON: N. Glee ArvinMichael Comer Devins, M.D.  ASSIST: Leighton ParodyAnn Marie Wilson, RNFA,  ANESTHESIA: general  IV FLUIDS AND URINE: See anesthesia.  ESTIMATED BLOOD LOSS: minimal mL.  IMPLANTS: smith and nephew 8.0, 7.0 mm cannulated screws  DRAINS: None.  COMPLICATIONS: None.  DESCRIPTION OF PROCEDURE: The patient was brought to the operating room and placed supine on the operating table.  The patient had been signed prior to the procedure and this was documented. The patient had the anesthesia placed by the anesthesiologist.  The prep verification and incision time-outs were performed to confirm that this was the correct patient, site, side and location. The patient had SCDs in place. The patient did receive antibiotics prior to the incision and was redosed during the procedure as needed at indicated intervals.  The patient's well leg was placed in a scissored position and carefully padded.  The operative leg was placed in traction and also well-padded.  Care was taken to confirm radiographs before prepping and draping. The hip was prepped and draped in the standard fashion.  Screws were placed using the following technique.  The 0.062" Kirschner wire was first placed and confirmed to be in the proper location on both AP and lateral views. After the guidewire was placed, the skin incision was made over the guidewire, then the 4.5 mm cannulated drill was started over the wire.  The drill was used  to drill the bony corridor, again confirming during drilling on both views. The final cannulated screw guidewire was then placed and again confirmed in position on both views.  The measuring stick was used to measure the length of screws.  This was repeated for all screws.  The approach-withdraw phenomenon was visualized under live fluoroscopy to confirm that all screws were of the appropriate length and in the head.   All wounds were copiously irrigated with saline and then the skin was reapproximated with staples. The wounds were cleaned and dried a final time and a sterile dressing. The patient was then transferred back to the bed and left the operating room in stable condition.  All sponge and instrument counts were correct.  POSTOPERATIVE PLAN: [name] will be weight bearing as tolerated; he will return for staple removal 2 weeks. [name] will receive DVT prophylaxis.

## 2014-06-25 NOTE — Discharge Instructions (Signed)
° ° °  1. Change dressings as needed °2. May shower but keep incisions covered and dry °3. Take lovenox to prevent blood clots °4. Take stool softeners as needed °5. Take pain meds as needed ° °

## 2014-06-25 NOTE — Transfer of Care (Signed)
Immediate Anesthesia Transfer of Care Note  Patient: Timothy Trujillo  Procedure(s) Performed: Procedure(s): CANNULATED HIP PINNING (Left)  Patient Location: PACU  Anesthesia Type:General  Level of Consciousness: awake, alert , oriented and patient cooperative  Airway & Oxygen Therapy: Patient Spontanous Breathing and Patient connected to nasal cannula oxygen  Post-op Assessment: Report given to RN and Post -op Vital signs reviewed and stable  Post vital signs: Reviewed and stable  Last Vitals:  Filed Vitals:   06/25/14 0500  BP: 126/77  Pulse: 106  Temp: 36.7 C  Resp: 16    Complications: No apparent anesthesia complications

## 2014-06-25 NOTE — Anesthesia Postprocedure Evaluation (Signed)
Anesthesia Post Note  Patient: Timothy Trujillo  Procedure(s) Performed: Procedure(s) (LRB): CANNULATED HIP PINNING (Left)  Anesthesia type: General  Patient location: PACU  Post pain: Pain level controlled and Adequate analgesia  Post assessment: Post-op Vital signs reviewed, Patient's Cardiovascular Status Stable, Respiratory Function Stable, Patent Airway and Pain level controlled  Last Vitals:  Filed Vitals:   06/25/14 1100  BP: 146/78  Pulse: 107  Temp: 36.7 C  Resp: 18    Post vital signs: Reviewed and stable  Level of consciousness: awake, alert  and oriented  Complications: No apparent anesthesia complications

## 2014-06-26 DIAGNOSIS — E119 Type 2 diabetes mellitus without complications: Secondary | ICD-10-CM

## 2014-06-26 LAB — CBC
HEMATOCRIT: 31.9 % — AB (ref 39.0–52.0)
Hemoglobin: 10.5 g/dL — ABNORMAL LOW (ref 13.0–17.0)
MCH: 28.8 pg (ref 26.0–34.0)
MCHC: 32.9 g/dL (ref 30.0–36.0)
MCV: 87.4 fL (ref 78.0–100.0)
Platelets: 216 10*3/uL (ref 150–400)
RBC: 3.65 MIL/uL — ABNORMAL LOW (ref 4.22–5.81)
RDW: 14 % (ref 11.5–15.5)
WBC: 13.5 10*3/uL — AB (ref 4.0–10.5)

## 2014-06-26 LAB — GLUCOSE, CAPILLARY
GLUCOSE-CAPILLARY: 244 mg/dL — AB (ref 70–99)
Glucose-Capillary: 267 mg/dL — ABNORMAL HIGH (ref 70–99)
Glucose-Capillary: 242 mg/dL — ABNORMAL HIGH (ref 70–99)
Glucose-Capillary: 205 mg/dL — ABNORMAL HIGH (ref 70–99)
Glucose-Capillary: 180 mg/dL — ABNORMAL HIGH (ref 70–99)
Glucose-Capillary: 281 mg/dL — ABNORMAL HIGH (ref 70–99)
Glucose-Capillary: 404 mg/dL — ABNORMAL HIGH (ref 70–99)

## 2014-06-26 LAB — BASIC METABOLIC PANEL
ANION GAP: 8 (ref 5–15)
BUN: 20 mg/dL (ref 6–23)
CO2: 30 mmol/L (ref 19–32)
CREATININE: 0.86 mg/dL (ref 0.50–1.35)
Calcium: 8.9 mg/dL (ref 8.4–10.5)
Chloride: 98 mmol/L (ref 96–112)
GFR calc Af Amer: >90 mL/min (ref >90)
GFR calc non Af Amer: 89 mL/min — ABNORMAL LOW (ref >90)
GLUCOSE: 241 mg/dL — AB (ref 70–99)
POTASSIUM: 4.2 mmol/L (ref 3.5–5.1)
Sodium: 136 mmol/L (ref 135–145)

## 2014-06-26 LAB — MAGNESIUM: Magnesium: 1.4 mg/dL — ABNORMAL LOW (ref 1.5–2.5)

## 2014-06-26 MED ORDER — MAGNESIUM OXIDE 400 (241.3 MG) MG PO TABS
400.0000 mg | ORAL_TABLET | Freq: Every day | ORAL | Status: AC
  Administered 2014-06-26 – 2014-06-27 (×2): 400 mg via ORAL
  Filled 2014-06-26 (×2): qty 1

## 2014-06-26 MED ORDER — MAGNESIUM SULFATE 2 GM/50ML IV SOLN
2.0000 g | Freq: Once | INTRAVENOUS | Status: AC
  Administered 2014-06-26: 2 g via INTRAVENOUS
  Filled 2014-06-26: qty 50

## 2014-06-26 MED ORDER — PREDNISONE 20 MG PO TABS
30.0000 mg | ORAL_TABLET | Freq: Every day | ORAL | Status: DC
  Administered 2014-06-27: 30 mg via ORAL
  Filled 2014-06-26 (×2): qty 1

## 2014-06-26 MED ORDER — INSULIN ASPART 100 UNIT/ML ~~LOC~~ SOLN
10.0000 [IU] | Freq: Once | SUBCUTANEOUS | Status: AC
  Administered 2014-06-26: 10 [IU] via SUBCUTANEOUS

## 2014-06-26 MED ORDER — SENNOSIDES-DOCUSATE SODIUM 8.6-50 MG PO TABS
2.0000 | ORAL_TABLET | Freq: Two times a day (BID) | ORAL | Status: DC
Start: 2014-06-26 — End: 2014-06-27
  Administered 2014-06-26 – 2014-06-27 (×2): 2 via ORAL
  Filled 2014-06-26 (×3): qty 2

## 2014-06-26 NOTE — Progress Notes (Signed)
PROGRESS NOTE  Timothy Trujillo WUJ:[id number] DOB: 10-21-1950 DOA: 06/23/2014 PCP: Fredirick MaudlinHAWKINS,EDWARD L, MD  HPI/Recap of past 24 hours: Uneventful night, no new complaints,  Reported worked with physical therapy this am. son in room  Assessment/Plan: Principal Problem:   Femoral neck fracture Active Problems:   Ischemic cardiomyopathy   COPD, severe  O2 dependent   Diabetes mellitus type 2, controlled   Hip fracture   Preop cardiovascular exam   Protein-calorie malnutrition, severe  Left hip fracture status post fall  - patient aware of  high risk for surgery given his cardiomyopathy CAD status post CABG and severe COPD. Patient however wish to have surgery. pulmonary and cardiology consults prior to surgery ORIF left hip 3/19, ortho input appreciated.  CAD status post CABG/chronic sinus tachycardia/chronic CHF, last known lvef 25% - denies any chest pain. Continue home meds. Currently euvolemic. Heart rate stable. Euvolemic, Cards following.  Severe COPD  - On home oxygen 3liter - presently not wheezing. - on chronic steroids at home,  -received stress dose IV steroids, doing well, taper steroid from 3/20 ,  Continue nebulizer.  Diabetes mellitus type 2 uncontrolled - a1c 10.6 (04/2014), repeat a1c pending Adjust insulin prn . H/o Anemia - stable at baseline  Hypomagnesemia: replace mag,  Constipation: stool softener.   DVT Prophylaxis lovenox Code Status: DO NOT RESUSCITATE.  Family Communication: patient and son  Disposition Plan: remain inpatient   Consultants:  Ortho  Cards  pulmnology  Procedures: Open treatment of proximal end of femur, neck with internal fixation left hip 3/19  Antibiotics:  Perioperative ancef   Objective: BP 123/80 mmHg  Pulse 91  Temp(Src) 97.7 F (36.5 C) (Oral)  Resp 18  Ht 6\' 3"  (1.905 m)  Wt 104.327 kg (230 lb)  BMI 28.75 kg/m2  SpO2 95%  Intake/Output Summary (Last 24 hours) at 06/26/14 1539 Last data filed  at 06/26/14 0952  Gross per 24 hour  Intake    360 ml  Output   1250 ml  Net   -890 ml   Filed Weights   06/23/14 2104  Weight: 104.327 kg (230 lb)    Exam:   General:  AAOx3, NAD  Cardiovascular: less sinus tachycardia  Respiratory: CTABL  Abdomen: soft/ND/ND, positive bowel sounds  Musculoskeletal: no edema, post op changes left leg.  Data Reviewed: Basic Metabolic Panel:  Recent Labs Lab 06/23/14 2241 06/24/14 0335 06/25/14 0530 06/26/14 0530  NA 138 140 135 136  K 4.2 4.0 3.5 4.2  CL 101 102 97 98  CO2 28 31 30 30   GLUCOSE 208* 181* 190* 241*  BUN 14 12 16 20   CREATININE 0.91 0.81 0.91 0.86  CALCIUM 8.8 9.0 9.3 8.9  MG  --   --   --  1.4*   Liver Function Tests:  Recent Labs Lab 06/23/14 2241 06/24/14 0335  AST 15 11  ALT 10 11  ALKPHOS 64 63  BILITOT 0.6 0.6  PROT 5.9* 5.9*  ALBUMIN 2.9* 2.9*   No results for input(s): LIPASE, AMYLASE in the last 168 hours. No results for input(s): AMMONIA in the last 168 hours. CBC:  Recent Labs Lab 06/23/14 2241 06/24/14 0335 06/25/14 0530 06/26/14 0530  WBC 9.6 8.3 11.0* 13.5*  NEUTROABS 7.5 6.5  --   --   HGB 10.7* 10.6* 10.9* 10.5*  HCT 31.8* 31.6* 32.8* 31.9*  MCV 85.5 85.9 85.6 87.4  PLT 182 195 220 216   Cardiac Enzymes:    Recent Labs Lab 06/24/14  0335  TROPONINI <0.03   BNP (last 3 results)  Recent Labs  05/06/14 2355 06/04/14 0325  BNP 646.0* 531.0*    ProBNP (last 3 results)  Recent Labs  07/30/13 1523 02/25/14 2200  PROBNP 604.2* 1014.0*    CBG:  Recent Labs Lab 06/26/14 0222 06/26/14 0500 06/26/14 0812 06/26/14 0949 06/26/14 1315  GLUCAP 267* 242* 244* 205* 180*    Recent Results (from the past 240 hour(s))  Surgical pcr screen     Status: None   Collection Time: 06/24/14  6:30 PM  Result Value Ref Range Status   MRSA, PCR NEGATIVE NEGATIVE Final   Staphylococcus aureus NEGATIVE NEGATIVE Final    Comment:        The Xpert SA Assay (FDA approved for  NASAL specimens in patients over 17 years of age), is one component of a comprehensive surveillance program.  Test performance has been validated by [facility] for patients greater than or equal to 26 year old. It is not intended to diagnose infection nor to guide or monitor treatment.      Studies: Dg Hip Operative Unilat With Pelvis Left  06/25/2014   CLINICAL DATA:  Left hip cannulation.  EXAM: OPERATIVE left HIP (WITH PELVIS IF PERFORMED) 2 VIEWS  TECHNIQUE: Fluoroscopic spot image(s) were submitted for interpretation post-operatively.  COMPARISON:  Plain film 06/23/2014  FINDINGS: Three cannulated screws span the left femoral neck fracture. No dislocation.  IMPRESSION: No complication following internal fixation of left femoral neck fracture.   Electronically Signed   By: Genevive Bi M.D.   On: 06/25/2014 11:22    Scheduled Meds: . atorvastatin  80 mg Oral Daily  . budesonide  0.25 mg Nebulization BID  . carvedilol  6.25 mg Oral BID WC  . digoxin  0.25 mg Oral Daily  . enoxaparin (LOVENOX) injection  40 mg Subcutaneous Q24H  . feeding supplement (ENSURE COMPLETE)  237 mL Oral Q1500  . feeding supplement (GLUCERNA SHAKE)  237 mL Oral BID BM  . insulin aspart  0-9 Units Subcutaneous Q4H  . insulin NPH Human  10 Units Subcutaneous BID AC & HS  . isosorbide mononitrate  60 mg Oral Daily  . methylPREDNISolone (SOLU-MEDROL) injection  40 mg Intravenous Daily  . nystatin ointment   Topical BID  . polyethylene glycol  17 g Oral Daily  . sertraline  50 mg Oral Daily  . torsemide  20 mg Oral BID  . vitamin B-12  1,000 mcg Oral Daily    Continuous Infusions:     Lorie Cleckley  [facility] Pager [phone]. If 7PM-7AM, please contact night-coverage at [email or web address], password [facility] 06/26/2014, 3:39 PM  LOS: 3 days

## 2014-06-26 NOTE — Clinical Social Work Note (Signed)
CSW received consult for SNF placement. CSW reviewed patient's chart and OT recommendation for no OT follow-up and PT recommendation for [facility] PT; Supervision. CSW met with patient and son who was present at bedside. CSW introduced self and explained role. CSW discussed d/c plan with patient. Per patient, he lives alone, but his oldest son lives near by. Patient states youngest son lives behind him and his sister lives next door to him. Patient further states his daughter-in-law lives next door to him as well. Per patient, his grandson checks in on him every morning before school and everyday after school. Patient states grandson brings him food after school and stays with him every weekend. Patient reports he has an electric wheelchair to ambulate around the home and has a handicap accessible bathroom and toilet which allows him to transition from wheelchair to toilet. Per patient, he has a cell phone that he carries around, but when he fell, it took "me an hour to get to the room and to my phone." Patient's son stated "everyone works and he's basically home alone." Patient's son asked about an emergency alert necklace so patient could easily assess assistance when in a crisis. Patient also requested a walker with a seat. Patient states he has enough support to assist him. Patient's son agrees with patient. Patient declines SNF placement at this time. CSW informed patient, RNCM would be made aware of requests for equipment. CSW contacted [facility] Judson Roch, and made her aware of the above. No further needs. CSW signing off.  [place], [place]ella [place] Weekend Clinical Social Worker [phone]

## 2014-06-26 NOTE — Progress Notes (Signed)
Pt stable Pain controlled - mild left hip Currently mobilizing with PT Plan for dc when medically stable

## 2014-06-26 NOTE — Evaluation (Addendum)
Physical Therapy Evaluation Patient Details Name: Timothy DecJames L Sorg MRN: [id number] DOB: December 27, 1950 Today's Date: 06/26/2014   History of Present Illness  Timothy Trujillo is a 64 y.o. male with history of end-stage cardiomyopathy last EF measured was 20-25% in January 2016, CAD status post CABG, severe COPD on home oxygen, chronic tachycardia, diabetes mellitus type 2 had a fall 2 days ago when patient was trying to go to the bathroom. Patient does not recall the exact circumstances of the fall. Since the patient's pain did not improve patient came to the ER 06/23/14 where xrays revealed L hip fx. Pt underwent ORIF 06/25/14  Clinical Impression  Pt admitted with above diagnosis. Pt currently with functional limitations due to the deficits listed below (see PT Problem List). On eval, he requires min assist for transfers and min guard assist gait 25 feet with RW. Pt will benefit from skilled PT to increase their independence and safety with mobility to allow discharge to the venue listed below.  Pt is declining return stay in SNF. He lives alone and has power w/c at home. He will be appropriate for d/c home with HHPT if he progresses to supervision at w/c level.     Follow Up Recommendations Home health PT;Supervision - Intermittent    Equipment Recommendations  Rollator    Recommendations for Other Services       Precautions / Restrictions Precautions Precautions: Fall Restrictions LLE Weight Bearing: Weight bearing as tolerated      Mobility  Bed Mobility Overal bed mobility: Needs Assistance Bed Mobility: Supine to Sit     Supine to sit: Min assist     General bed mobility comments: assist with LLE, use of bed rail  Transfers Overall transfer level: Needs assistance Equipment used: Rolling walker (2 wheeled) Transfers: Sit to/from Stand Sit to Stand: Min assist         General transfer comment: assist to power up  Ambulation/Gait Ambulation/Gait assistance: Min  guard Ambulation Distance (Feet): 25 Feet Assistive device: Rolling walker (2 wheeled) Gait Pattern/deviations: Step-through pattern;Decreased stride length;Antalgic   Gait velocity interpretation: Below normal speed for age/gender General Gait Details: continuous O2 with gait  Stairs            Wheelchair Mobility    Modified Rankin (Stroke Patients Only)       Balance Overall balance assessment: History of Falls                                           Pertinent Vitals/Pain Pain Assessment: 0-10 Pain Score: 4  Pain Location: L hip and low back Pain Intervention(s): Monitored during session    Home Living Family/patient expects to be discharged to:: Private residence Living Arrangements: Alone Available Help at Discharge: Family;Available PRN/intermittently Type of Home: House Home Access: Ramped entrance     Home Layout: One level Home Equipment: Bedside commode;Walker - 2 wheels;Wheelchair - power;Wheelchair - manual;Hand held shower head;Shower seat Additional Comments: Pt's grandson comes by to check on him 2x/day and stays every weekend, Friday-Sunday.     Prior Function Level of Independence: Independent with assistive device(s)   Gait / Transfers Assistance Needed: Uses power w/c for functional mobility or holds onto wall/furniture when moving around  ADL's / Homemaking Assistance Needed: Pt is able to perform some BADLs, needs assistance with IADLs.   Comments: Pt is on home O2.  Hand Dominance        Extremity/Trunk Assessment   Upper Extremity Assessment: Generalized weakness       LUE Deficits / Details: limited shoulder ROM due to a torn rotator cuff   Lower Extremity Assessment: Generalized weakness;LLE deficits/detail      Cervical / Trunk Assessment: Normal  Communication   Communication: No difficulties  Cognition Arousal/Alertness: Awake/alert Behavior During Therapy: WFL for tasks  assessed/performed Overall Cognitive Status: Within Functional Limits for tasks assessed                      General Comments      Exercises        Assessment/Plan    PT Assessment Patient needs continued PT services  PT Diagnosis Difficulty walking;Acute pain   PT Problem List Decreased strength;Decreased activity tolerance;Decreased balance;Decreased mobility;Pain;Decreased knowledge of precautions  PT Treatment Interventions Gait training;Functional mobility training;Therapeutic activities;Therapeutic exercise;Patient/family education;Balance training   PT Goals (Current goals can be found in the Care Plan section) Acute Rehab PT Goals Patient Stated Goal: home PT Goal Formulation: With patient Time For Goal Achievement: 07/03/14 Potential to Achieve Goals: Fair    Frequency Min 6X/week   Barriers to discharge        Co-evaluation               End of Session Equipment Utilized During Treatment: Gait belt;Oxygen Activity Tolerance: Patient tolerated treatment well Patient left: in chair;with call bell/phone within reach Nurse Communication: Mobility status         Time: 1032-1100 PT Time Calculation (min) (ACUTE ONLY): 28 min   Charges:   PT Evaluation $Initial PT Evaluation Tier I: 1 Procedure PT Treatments $Gait Training: 8-22 mins   PT G Codes:        Ilda Foil 06/26/2014, 1:14 PM

## 2014-06-26 NOTE — Evaluation (Signed)
Occupational Therapy Evaluation Patient Details Name: RODRIQUEZ THORNER MRN: [id number] DOB: 10/28/1950 Today's Date: 06/26/2014    History of Present Illness HANIEL FIX is a 64 y.o. male with history of end-stage cardiomyopathy last EF measured was 20-25% in January 2016, CAD status post CABG, severe COPD on home oxygen, chronic tachycardia, diabetes mellitus type 2 had a fall 2 days ago when patient was trying to go to the bathroom. Patient does not recall the exact circumstances of the fall. Since the patient's pain did not improve patient came to the ER 06/23/14 where xrays revealed L hip fx. Pt underwent ORIF 06/25/14   Clinical Impression   PTA pt lived at home alone and was independent with ADLs with use of power w/c or "furniture cruising" for functional mobility. Pt recently d/c home from SNF and wants to return home. Pt required only close min guard assist for bed mobility and transfers today and feel that he will progress quickly with functional mobility. Pt educated on compensatory techniques for LB ADLs. Pt will benefit from acute OT to address independence with LB ADLs and functional mobility to facilitate return home with family support.     Follow Up Recommendations  No OT follow up    Equipment Recommendations  None recommended by OT    Recommendations for Other Services       Precautions / Restrictions Precautions Precautions: Fall Restrictions Weight Bearing Restrictions: Yes LLE Weight Bearing: Weight bearing as tolerated      Mobility Bed Mobility Overal bed mobility: Needs Assistance Bed Mobility: Supine to Sit;Sit to Supine     Supine to sit: Min guard;HOB elevated Sit to supine: Min guard;HOB elevated   General bed mobility comments: Min guard with HOB elevated and use of bed rail. Pt with good management of LLE. No physical assist needed. No c/o dizziness.   Transfers Overall transfer level: Needs assistance Equipment used: Rolling walker (2  wheeled) Transfers: Sit to/from Stand Sit to Stand: Min guard         General transfer comment: Close min guard during transition. Pt slow to stand but did not require physical assistance.     Balance Overall balance assessment: History of Falls                                          ADL Overall ADL's : Needs assistance/impaired Eating/Feeding: Independent;Sitting   Grooming: Min guard;Standing   Upper Body Bathing: Set up;Sitting   Lower Body Bathing: Minimal assistance;Sit to/from stand   Upper Body Dressing : Set up;Sitting   Lower Body Dressing: Minimal assistance;Sit to/from stand Lower Body Dressing Details (indicate cue type and reason): A for socks and shoes. Pt plans to wear slip on shoes at home Toilet Transfer: Min guard;Ambulation;RW Toilet Transfer Details (indicate cue type and reason): sit<>stand from EOB Toileting- Clothing Manipulation and Hygiene: Min guard;Sit to/from stand       Functional mobility during ADLs: Min guard;Rolling walker General ADL Comments: Pt wants to d/c home. Has all necessary DME. No physical assist needed for bed mobility or sit<>stand. Educated on LB compensatory techniques.      Vision Vision Assessment?: No apparent visual deficits          Pertinent Vitals/Pain Pain Assessment: No/denies pain Pain Score: 4  Pain Location: L hip and low back Pain Intervention(s): Monitored during session     Hand Dominance  Extremity/Trunk Assessment Upper Extremity Assessment Upper Extremity Assessment: Generalized weakness LUE Deficits / Details: limited shoulder ROM due to a torn rotator cuff   Lower Extremity Assessment Lower Extremity Assessment: Defer to PT evaluation LLE: Unable to fully assess due to pain   Cervical / Trunk Assessment Cervical / Trunk Assessment: Normal   Communication Communication Communication: No difficulties   Cognition Arousal/Alertness: Awake/alert Behavior During  Therapy: WFL for tasks assessed/performed Overall Cognitive Status: Within Functional Limits for tasks assessed                                Home Living Family/patient expects to be discharged to:: Private residence Living Arrangements: Alone Available Help at Discharge: Family;Available PRN/intermittently Type of Home: House Home Access: Ramped entrance     Home Layout: One level     Bathroom Shower/Tub: Chief Strategy OfficerTub/shower unit   Bathroom Toilet: Handicapped height Bathroom Accessibility: Yes How Accessible: Accessible via wheelchair Home Equipment: Bedside commode;Walker - 2 wheels;Wheelchair - power;Wheelchair - manual;Hand held shower head;Tub bench   Additional Comments: Pt's grandson comes by to check on him 2x/day and stays every weekend, Friday-Sunday. He may stay overnight with him during the week now.       Prior Functioning/Environment Level of Independence: Independent with assistive device(s)  Gait / Transfers Assistance Needed: Uses power w/c for functional mobility or holds onto wall/furniture when moving around ADL's / Homemaking Assistance Needed: Pt is able to perform some BADLs, needs assistance with IADLs.    Comments: Pt is on home O2.    OT Diagnosis: Generalized weakness;Acute pain   OT Problem List: Decreased strength;Decreased range of motion;Decreased activity tolerance;Impaired balance (sitting and/or standing);Pain   OT Treatment/Interventions: Self-care/ADL training;Therapeutic exercise;Energy conservation;DME and/or AE instruction;Therapeutic activities;Patient/family education;Balance training    OT Goals(Current goals can be found in the care plan section) Acute Rehab OT Goals Patient Stated Goal: to get home to my independence OT Goal Formulation: With patient Time For Goal Achievement: 07/10/14 Potential to Achieve Goals: Good  OT Frequency: Min 2X/week    End of Session Equipment Utilized During Treatment: Rolling  walker;Oxygen  Activity Tolerance: Patient tolerated treatment well Patient left: in bed;with call bell/phone within reach;with SCD's reapplied   Time: 1337-1400 OT Time Calculation (min): 23 min Charges:  OT General Charges $OT Visit: 1 Procedure OT Evaluation $Initial OT Evaluation Tier I: 1 Procedure OT Treatments $Self Care/Home Management : 8-22 mins G-Codes:    Nena JordanMiller, Cariah Salatino M 06/26/2014, 2:13 PM  Carney LivingLeeAnn Marie Prakriti Carignan, OTR/L Occupational Therapist [phone] (pager)

## 2014-06-26 NOTE — Progress Notes (Addendum)
Patient ID: Timothy Trujillo, male   DOB: [birth year], 64 y.o.   MRN: [id number]    Subjective:    No chest pain, no SOB  Objective:   Temp:  [97.3 F (36.3 C)-98.2 F (36.8 C)] 97.7 F (36.5 C) (03/20 0500) Pulse Rate:  [91-116] 91 (03/20 0500) Resp:  [16-25] 18 (03/20 0500) BP: (115-149)/(59-83) 123/80 mmHg (03/20 0500) SpO2:  [90 %-98 %] 97 % (03/20 0500) Arterial Line BP: (101-115)/(62-70) 101/62 mmHg (03/19 1045) Last BM Date: 06/23/14  Filed Weights   06/23/14 2104  Weight: 230 lb (104.327 kg)    Intake/Output Summary (Last 24 hours) at 06/26/14 0818 Last data filed at 06/26/14 0502  Gross per 24 hour  Intake    520 ml  Output   1325 ml  Net   -805 ml    Telemetry: SR and mild sinus tach  Exam:  General: NAD  Resp: CTAB  Cardiac: RRR, no m/r/g, no JVD  NW:GNFAOZH soft, NT, ND  MSK:no LE edema  Neuro: no focal deficits   Lab Results:  Basic Metabolic Panel:  Recent Labs Lab 06/23/14 2241 06/24/14 0335 06/25/14 0530  NA 138 140 135  K 4.2 4.0 3.5  CL 101 102 97  CO2 GLUCOSE 208* 181* 190*  BUN CREATININE 0.91 0.81 0.91  CALCIUM 8.8 9.0 9.3    Liver Function Tests:  Recent Labs Lab 06/23/14 2241 06/24/14 0335  AST 15 11  ALT 10 11  ALKPHOS 64 63  BILITOT 0.6 0.6  PROT 5.9* 5.9*  ALBUMIN 2.9* 2.9*    CBC:  Recent Labs Lab 06/23/14 2241 06/24/14 0335 06/25/14 0530  WBC 9.6 8.3 11.0*  HGB 10.7* 10.6* 10.9*  HCT 31.8* 31.6* 32.8*  MCV 85.5 85.9 85.6  PLT 182 195 220    Cardiac Enzymes:  Recent Labs Lab 06/24/14 0335  TROPONINI <0.03    BNP:  Recent Labs  07/30/13 1523 02/25/14 2200  PROBNP 604.2* 1014.0*    Coagulation:  Recent Labs Lab 06/23/14 2345  INR 1.06    ECG:   Medications:   Scheduled Medications: . atorvastatin  80 mg Oral Daily  . budesonide  0.25 mg Nebulization BID  . carvedilol  6.25 mg Oral BID WC  . digoxin  0.25 mg Oral Daily  . enoxaparin (LOVENOX)  injection  40 mg Subcutaneous Q24H  . feeding supplement (ENSURE COMPLETE)  237 mL Oral Q1500  . feeding supplement (GLUCERNA SHAKE)  237 mL Oral BID BM  . insulin aspart  0-9 Units Subcutaneous Q4H  . insulin NPH Human  10 Units Subcutaneous BID AC & HS  . isosorbide mononitrate  60 mg Oral Daily  . methylPREDNISolone (SOLU-MEDROL) injection  40 mg Intravenous Daily  . nystatin ointment   Topical BID  . polyethylene glycol  17 g Oral Daily  . sertraline  50 mg Oral Daily  . torsemide  20 mg Oral BID  . vitamin B-12  1,000 mcg Oral Daily     Infusions:     PRN Medications:  acetaminophen **OR** acetaminophen, ALPRAZolam, alum & mag hydroxide-simeth, HYDROcodone-acetaminophen, levalbuterol, menthol-cetylpyridinium **OR** phenol, methocarbamol **OR** methocarbamol (ROBAXIN)  IV, metoCLOPramide **OR** metoCLOPramide (REGLAN) injection, morphine injection, nitroGLYCERIN, ondansetron **OR** ondansetron (ZOFRAN) IV, oxyCODONE-acetaminophen **AND** oxyCODONE     Assessment/Plan    1. Chronic systolic heart failure/ICM - Jan 2016 echo LVEF 20-25%,  - medical therapy in general has been limited by orthostatic symptoms - stable on current regiment,  stable hemodynamics s/p surgery. Appears euvolemic, continue current meds - ASA if ok with surgery and primary team  2. Hip fracture - s/p surgery yesterday, tolerated well.        Dina RichJonathan Mika Trujillo, M.D.

## 2014-06-27 LAB — GLUCOSE, CAPILLARY
GLUCOSE-CAPILLARY: 312 mg/dL — AB (ref 70–99)
GLUCOSE-CAPILLARY: 152 mg/dL — AB (ref 70–99)
Glucose-Capillary: 184 mg/dL — ABNORMAL HIGH (ref 70–99)
Glucose-Capillary: 157 mg/dL — ABNORMAL HIGH (ref 70–99)
Glucose-Capillary: 333 mg/dL — ABNORMAL HIGH (ref 70–99)

## 2014-06-27 LAB — BASIC METABOLIC PANEL
Anion gap: 7 (ref 5–15)
BUN: 26 mg/dL — AB (ref 6–23)
CO2: 32 mmol/L (ref 19–32)
Calcium: 9.1 mg/dL (ref 8.4–10.5)
Chloride: 101 mmol/L (ref 96–112)
Creatinine, Ser: 0.95 mg/dL (ref 0.50–1.35)
GFR calc Af Amer: >90 mL/min (ref >90)
GFR calc non Af Amer: 85 mL/min — ABNORMAL LOW (ref >90)
GLUCOSE: 159 mg/dL — AB (ref 70–99)
POTASSIUM: 4 mmol/L (ref 3.5–5.1)
Sodium: 140 mmol/L (ref 135–145)

## 2014-06-27 LAB — CBC
HEMATOCRIT: 33.4 % — AB (ref 39.0–52.0)
HEMOGLOBIN: 11 g/dL — AB (ref 13.0–17.0)
MCH: 28.9 pg (ref 26.0–34.0)
MCHC: 32.9 g/dL (ref 30.0–36.0)
MCV: 87.7 fL (ref 78.0–100.0)
Platelets: 219 10*3/uL (ref 150–400)
RBC: 3.81 MIL/uL — ABNORMAL LOW (ref 4.22–5.81)
RDW: 14 % (ref 11.5–15.5)
WBC: 9.8 10*3/uL (ref 4.0–10.5)

## 2014-06-27 LAB — MAGNESIUM: Magnesium: 1.4 mg/dL — ABNORMAL LOW (ref 1.5–2.5)

## 2014-06-27 MED ORDER — PREDNISONE 20 MG PO TABS: 20.0000 mg | ORAL_TABLET | Freq: Every day | ORAL | Status: DC

## 2014-06-27 NOTE — Discharge Summary (Signed)
Discharge Summary  Timothy Trujillo JYN:[id number] DOB: 05/22/1950  PCP: Fredirick Maudlin, MD  Admit date: 06/23/2014 Discharge date: 06/27/2014  Time spent: >45mins  Recommendations for Outpatient Follow-up:  1. F/u with PMD for health maintenance 2. F/u with orthopedics Dr. Roda Shutters in two week 3. F/u with [facility] on 4/12 4. Home health/PT  Discharge Diagnoses:  Active Hospital Problems   Diagnosis Date Noted  . Femoral neck fracture 06/23/2014  . Diabetes mellitus type 2, controlled 06/24/2014  . Hip fracture 06/24/2014  . Preop cardiovascular exam 06/24/2014  . Protein-calorie malnutrition, severe 06/24/2014  . Ischemic cardiomyopathy 01/16/2013  . COPD, severe  O2 dependent 01/16/2013    Resolved Hospital Problems   Diagnosis Date Noted Date Resolved  No resolved problems to display.    Discharge Condition: stable  Diet recommendation: heart healthy/carb modified  Filed Weights   06/23/14 2104  Weight: 104.327 kg (230 lb)    History of present illness:  Timothy Trujillo is a 64 y.o. male with history of end-stage cardiomyopathy last EF measured was 20-25% in January 2016, CAD status post CABG, severe COPD on home oxygen, chronic tachycardia, diabetes mellitus type 2 had a fall 2 days ago when patient was trying to go to the bathroom. Patient does not recall the exact circumstances of the fall. Since the patient's pain did not improve patient came to the ER last night. X-rays revealed a left hip fracture and on-call orthopedic surgeon has been consulted. Patient at this time denies any chest pain. Patient states he has chronic shortness of breath and is at baseline. On exam patient is not in distress. Denies any nausea vomiting headache visual symptoms fever chills diarrhea.   Hospital Course:  Principal Problem:   Femoral neck fracture Active Problems:   Ischemic cardiomyopathy   COPD, severe  O2 dependent   Diabetes mellitus type 2, controlled   Hip  fracture   Preop cardiovascular exam   Protein-calorie malnutrition, severe  Left hip fracture status post fall - patient aware of high risk for surgery given his cardiomyopathy CAD status post CABG and severe COPD. Patient however wish to have surgery. pulmonary and cardiology consults prior to surgery ORIF left hip 3/19, ortho input appreciated. Discussed with orthopedics DR. Vihana Kydd, patient is cleared to discharge with resume plavix, no need of lovenox.  CAD status post CABG/chronic sinus tachycardia/chronic CHF, last known lvef 25% - denies any chest pain. Continue home meds. Currently euvolemic. Heart rate stable. Euvolemic, Cards following.  Severe COPD  - On home oxygen 3liter - presently not wheezing. - on chronic steroids at home,  -received stress dose IV steroids, doing well, taper steroid from 3/20 ,  Continue nebulizer.  Diabetes mellitus type 2 uncontrolled - a1c 10.6 (04/2014), repeat a1c pending Adjust insulin prn . H/o Anemia - stable at baseline  Hypomagnesemia: replace mag,  Constipation: stool softener.    Code Status: DO NOT RESUSCITATE.  Family Communication: patient and son  Disposition Plan: patient decline SNF offer, home with HH/PT   Consultants:  Ortho  Cards  pulmnology  Procedures: Open treatment of proximal end of femur, neck with internal fixation left hip 3/19  Antibiotics:  Perioperative ancef  Discharge Exam: BP 120/71 mmHg  Pulse 96  Temp(Src) 98.2 F (36.8 C) (Oral)  Resp 16  Ht 6\' 3"  (1.905 m)  Wt 104.327 kg (230 lb)  BMI 28.75 kg/m2  SpO2 100%   General: AAOx3, NAD  Cardiovascular: less sinus tachycardia  Respiratory: CTABL  Abdomen: soft/ND/ND, positive bowel sounds  Musculoskeletal: no edema, post op changes left leg   Discharge Instructions You were cared for by a hospitalist during your hospital stay. If you have any questions about your discharge medications or the care you received while you were  in the hospital after you are discharged, you can call the unit and asked to speak with the hospitalist on call if the hospitalist that took care of you is not available. Once you are discharged, your primary care physician will handle any further medical issues. Please note that NO REFILLS for any discharge medications will be authorized once you are discharged, as it is imperative that you return to your primary care physician (or establish a relationship with a primary care physician if you do not have one) for your aftercare needs so that they can reassess your need for medications and monitor your lab values.  Discharge Instructions    DME Other see comment    Complete by:  As directed   [id number]     Diet - low sodium heart healthy    Complete by:  As directed      Face-to-face encounter (required for Medicare/Medicaid patients)    Complete by:  As directed   I Timothy Trujillo certify that this patient is under my care and that I, or a nurse practitioner or physician's assistant working with me, had a face-to-face encounter that meets the physician face-to-face encounter requirements with this patient on 06/27/2014. The encounter with the patient was in whole, or in part for the following medical condition(s) which is the primary reason for home health care (List medical condition): fall/hip fracture  The encounter with the patient was in whole, or in part, for the following medical condition, which is the primary reason for home health care:  fall/fracture  I certify that, based on my findings, the following services are medically necessary home health services:  Physical therapy  Reason for Medically Necessary Home Health Services:  Skilled Nursing- Change/Decline in Patient Status  My clinical findings support the need for the above services:  Unable to leave home safely without assistance and/or assistive device  Further, I certify that my clinical findings support that this patient is homebound due  to:  Unable to leave home safely without assistance     Home Health    Complete by:  As directed   To provide the following care/treatments:  PT     Increase activity slowly    Complete by:  As directed      Weight bearing as tolerated    Complete by:  As directed             Medication List    TAKE these medications        acetaminophen 325 MG tablet  Commonly known as:  TYLENOL  Take 2 tablets (650 mg total) by mouth every 6 (six) hours as needed for mild pain (or Fever >/= 101).     ALPRAZolam 0.25 MG tablet  Commonly known as:  XANAX  Take 1 tablet (0.25 mg total) by mouth 2 (two) times daily as needed for anxiety.     aspirin 325 MG tablet  Take 325 mg by mouth daily.     atorvastatin 80 MG tablet  Commonly known as:  LIPITOR  Take 1 tablet (80 mg total) by mouth daily.     budesonide 0.25 MG/2ML nebulizer solution  Commonly known as:  PULMICORT  Take 2 mLs (0.25 mg total) by  nebulization 2 (two) times daily.     carvedilol 6.25 MG tablet  Commonly known as:  COREG  Take 1 tablet (6.25 mg total) by mouth 2 (two) times daily.     clopidogrel 75 MG tablet  Commonly known as:  PLAVIX  Take 1 tablet (75 mg total) by mouth daily.     digoxin 0.25 MG tablet  Commonly known as:  LANOXIN  Take 1 tablet (0.25 mg total) by mouth daily.     diphenhydrAMINE 50 MG tablet  Commonly known as:  BENADRYL  Take 1 tablet (50 mg total) by mouth every 6 (six) hours as needed for itching.     feeding supplement (ENSURE COMPLETE) Liqd  Take 237 mLs by mouth 2 (two) times daily between meals.     hydrocortisone 2.5 % cream  Apply 1 application topically daily as needed (itching).     insulin aspart 100 UNIT/ML injection  Commonly known as:  novoLOG  Inject 0-15 Units into the skin 3 (three) times daily with meals.     insulin aspart 100 UNIT/ML injection  Commonly known as:  novoLOG  Inject 0-5 Units into the skin at bedtime.     insulin NPH Human 100 UNIT/ML injection    Commonly known as:  HUMULIN N,NOVOLIN N  Inject 0.4 mLs (40 Units total) into the skin daily before breakfast.     insulin NPH Human 100 UNIT/ML injection  Commonly known as:  HUMULIN N,NOVOLIN N  Inject 0.2 mLs (20 Units total) into the skin at bedtime.     isosorbide mononitrate 60 MG 24 hr tablet  Commonly known as:  IMDUR  Take 1 tablet (60 mg total) by mouth daily.     levalbuterol 0.63 MG/3ML nebulizer solution  Commonly known as:  XOPENEX  Take 3 mLs (0.63 mg total) by nebulization every 6 (six) hours as needed for wheezing or shortness of breath.     nitroGLYCERIN 0.4 MG SL tablet  Commonly known as:  NITROSTAT  Place 1 tablet (0.4 mg total) under the tongue every 5 (five) minutes as needed for chest pain.     ondansetron 4 MG tablet  Commonly known as:  ZOFRAN  Take 1 tablet (4 mg total) by mouth every 6 (six) hours as needed for nausea.     oxyCODONE 5 MG immediate release tablet  Commonly known as:  Oxy IR/ROXICODONE  Take 1-3 tablets (5-15 mg total) by mouth every 4 (four) hours as needed.     oxyCODONE-acetaminophen 10-325 MG per tablet  Commonly known as:  PERCOCET  Take 1 tablet by mouth every 4 (four) hours as needed for pain.     polyethylene glycol packet  Commonly known as:  MIRALAX / GLYCOLAX  Take 17 g by mouth daily.     potassium chloride SA 20 MEQ tablet  Commonly known as:  K-DUR,KLOR-CON  Take 1 tablet (20 mEq total) by mouth 2 (two) times daily.     predniSONE 10 MG tablet  Commonly known as:  DELTASONE  Take 1 tablet (10 mg total) by mouth daily with breakfast.     sertraline 50 MG tablet  Commonly known as:  ZOLOFT  Take 1 tablet (50 mg total) by mouth daily.     torsemide 20 MG tablet  Commonly known as:  DEMADEX  Take 1 tablet (20 mg total) by mouth 2 (two) times daily.     vitamin B-12 1000 MCG tablet  Commonly known as:  CYANOCOBALAMIN  Take 1,000 mcg  by mouth daily.     vitamin C 500 MG tablet  Commonly known as:  ASCORBIC  ACID  Take 1,000 mg by mouth at bedtime.     vitamin E 400 UNIT capsule  Take 400 Units by mouth at bedtime.       Allergies  Allergen Reactions  . Baycol [Cerivastatin] Other (See Comments)    Patient states it "about killed me" kidney shut down. Black stools.  . Stadol [Butorphanol Tartrate] Other (See Comments)    "makes him crazy"       Follow-up Information    Follow up with Cheral Almas, MD In 2 weeks.   Specialty:  Orthopedic Surgery   Why:  For suture removal, For wound re-check   Contact information:   [address] [place] [address] [phone]       Follow up with Joni Reining, NP.   Specialty:  Nurse Practitioner   Why:  [facility]) - 07/19/14 at 1:10pm   Contact information:   [address] [place] [address] [phone]       Follow up with [facility].   Specialty:  Home Health Services   Why:  Someone from [facility] information:   [address] [place] [address] [phone]       Follow up with [facility].   Specialty:  Home Health Services   Why:  Someone from [facility] will contact you concerning start date and time for physical therapy.   Contact information:   [address] [place] [address] [phone]        The results of significant diagnostics from this hospitalization (including imaging, microbiology, ancillary and laboratory) are listed below for reference.    Significant Diagnostic Studies: Dg Ribs Unilateral W/chest Left  06/23/2014   CLINICAL DATA:  Fall 3 days ago. Flank pain. Left hip pain. Left rib pain.  EXAM: LEFT RIBS AND CHEST - 3+ VIEW  COMPARISON:  06/04/2014  FINDINGS: No fracture or other bone lesions are seen involving the ribs. There is no evidence of pneumothorax or pleural effusion. There is stable cardiomegaly. Prior CABG. Bilateral diffuse interstitial thickening, likely  chronic.  IMPRESSION: 1. No acute osseous injury of the left ribs. 2. Bilateral diffuse chronic interstitial lung disease. Mild superimposed interstitial edema cannot be excluded.   Electronically Signed   By: Elige Ko   On: 06/23/2014 22:31   Ct Head Wo Contrast  06/24/2014   CLINICAL DATA:  Larey Seat on Tuesday, fracturing left hip.  EXAM: CT HEAD WITHOUT CONTRAST  TECHNIQUE: Contiguous axial images were obtained from the base of the skull through the vertex without intravenous contrast.  COMPARISON:  04/01/2008  FINDINGS: There is no intracranial hemorrhage, mass or evidence of acute infarction. There is a 2.0 x 3.5 cm CSF density abnormality in the inferior right basal ganglia, perhaps a very large perivascular space. There is a 7 mm enlarged perivascular space in the inferior left basal ganglia. These findings are unchanged from 04/01/2008. There is mild generalized atrophy and mild white matter hypodensity which may represent chronic small vessel ischemic disease. Calvarium and skullbase are intact.  IMPRESSION: No evidence of acute intracranial traumatic injury. Unchanged CSF density abnormalities in the inferior basal ganglia, probably very large perivascular spaces. Mild atrophy and chronic microvascular disease.   Electronically Signed   By: Ellery Plunk M.D.   On: 06/24/2014 00:37  Ct Hip Left Wo Contrast  06/24/2014   CLINICAL DATA:  Status post fall 06/20/2014. The patient presented to the emergency room 06/23/2014 with pain and inability to bear weight fracture by plain films. Initial encounter.  EXAM: CT OF THE LEFT HIP WITHOUT CONTRAST  TECHNIQUE: Multidetector CT imaging of the left hip was performed according to the standard protocol. Multiplanar CT image reconstructions were also generated.  COMPARISON:  Plain films left hip 06/23/2014.  FINDINGS: As seen on the patient's plain films, there is a minimally impacted fracture of the left femoral neck. The fracture extends from a subcapital  position superiorly in an inferior and oblique orientation through the mid aspect of the femoral neck. No other fracture is identified. No focal bony lesion is seen. No notable degenerative change is present about the right hip. Visualized lower lumbar spine is unremarkable. Imaged intrapelvic contents demonstrate aortoiliac atherosclerosis but are otherwise unremarkable.  IMPRESSION: Acute appearing, minimally impacted left femoral neck fracture.   Electronically Signed   By: Drusilla Kanner M.D.   On: 06/24/2014 13:51   Dg Chest Portable 1 View  06/04/2014   CLINICAL DATA:  Shortness of breath and sharp constant chest pain. Symptoms for 1 day.  EXAM: PORTABLE CHEST - 1 VIEW  COMPARISON:  Radiographs and CT 05/07/2014  FINDINGS: Patient is post median sternotomy. Cardiomegaly is slightly decreased. Pulmonary edema has progressed from prior. Blunting of both costophrenic angles, suspect small pleural effusions. No pneumothorax. No confluent airspace disease.  IMPRESSION: Moderate CHF.   Electronically Signed   By: Rubye Oaks M.D.   On: 06/04/2014 04:58   Dg Hip Operative Unilat With Pelvis Left  06/25/2014   CLINICAL DATA:  Left hip cannulation.  EXAM: OPERATIVE left HIP (WITH PELVIS IF PERFORMED) 2 VIEWS  TECHNIQUE: Fluoroscopic spot image(s) were submitted for interpretation post-operatively.  COMPARISON:  Plain film 06/23/2014  FINDINGS: Three cannulated screws span the left femoral neck fracture. No dislocation.  IMPRESSION: No complication following internal fixation of left femoral neck fracture.   Electronically Signed   By: Genevive Bi M.D.   On: 06/25/2014 11:22   Dg Hip Unilat With Pelvis 2-3 Views Left  06/23/2014   CLINICAL DATA:  Larey Seat 3 days ago.  Unable to walk.  EXAM: LEFT HIP (WITH PELVIS) 2-3 VIEWS  COMPARISON:  None.  FINDINGS: There is a left femoral neck fracture with mild valgus angulation. No bone lesion is evident. No other fractures are evident.  IMPRESSION: Left femoral  neck fracture   Electronically Signed   By: Ellery Plunk M.D.   On: 06/23/2014 22:31    Microbiology: Recent Results (from the past 240 hour(s))  Surgical pcr screen     Status: None   Collection Time: 06/24/14  6:30 PM  Result Value Ref Range Status   MRSA, PCR NEGATIVE NEGATIVE Final   Staphylococcus aureus NEGATIVE NEGATIVE Final    Comment:        The Xpert SA Assay (FDA approved for NASAL specimens in patients over 80 years of age), is one component of a comprehensive surveillance program.  Test performance has been validated by [facility] for patients greater than or equal to 31 year old. It is not intended to diagnose infection nor to guide or monitor treatment.      Labs: Basic Metabolic Panel:  Recent Labs Lab 06/23/14 2241 06/24/14 0335 06/25/14 0530 06/26/14 0530 06/27/14 0705  NA 138 140 135 136 140  K 4.2 4.0 3.5 4.2 4.0  CL  101 102 97 98 101  CO2 32  GLUCOSE 208* 181* 190* 241* 159*  BUN 26*  CREATININE 0.91 0.81 0.91 0.86 0.95  CALCIUM 8.8 9.0 9.3 8.9 9.1  MG  --   --   --  1.4*  --    Liver Function Tests:  Recent Labs Lab 06/23/14 2241 06/24/14 0335  AST 15 11  ALT 10 11  ALKPHOS 64 63  BILITOT 0.6 0.6  PROT 5.9* 5.9*  ALBUMIN 2.9* 2.9*   No results for input(s): LIPASE, AMYLASE in the last 168 hours. No results for input(s): AMMONIA in the last 168 hours. CBC:  Recent Labs Lab 06/23/14 2241 06/24/14 0335 06/25/14 0530 06/26/14 0530 06/27/14 0705  WBC 9.6 8.3 11.0* 13.5* 9.8  NEUTROABS 7.5 6.5  --   --   --   HGB 10.7* 10.6* 10.9* 10.5* 11.0*  HCT 31.8* 31.6* 32.8* 31.9* 33.4*  MCV 85.5 85.9 85.6 87.4 87.7  PLT 182 195 220 216 219   Cardiac Enzymes:  Recent Labs Lab 06/24/14 0335  TROPONINI <0.03   BNP: BNP (last 3 results)  Recent Labs  05/06/14 2355 06/04/14 0325  BNP 646.0* 531.0*    ProBNP (last 3 results)  Recent Labs  07/30/13 1523 02/25/14 2200  PROBNP 604.2*  1014.0*    CBG:  Recent Labs Lab 06/26/14 2206 06/27/14 0100 06/27/14 0419 06/27/14 0748 06/27/14 1133  GLUCAP 404* 312* 184* 157* 152*       Signed:  Jaegar Croft MD, PhD  [facility] 06/27/2014, 2:41 PM

## 2014-06-27 NOTE — Progress Notes (Signed)
Physical Therapy Treatment Patient Details Name: Timothy Trujillo MRN: [id number] DOB: April 02, 1951 Today's Date: 06/27/2014    History of Present Illness Timothy Trujillo is a 64 y.o. male with history of end-stage cardiomyopathy last EF measured was 20-25% in January 2016, CAD status post CABG, severe COPD on home oxygen, chronic tachycardia, diabetes mellitus type 2 had a fall 2 days ago when patient was trying to go to the bathroom. Patient does not recall the exact circumstances of the fall. Since the patient's pain did not improve patient came to the ER 06/23/14 where xrays revealed L hip fx. Pt underwent ORIF 06/25/14    PT Comments    Pt demonstrated ability to ambulate 40 ft today w/ sx of lightheadedness (4 LPM) which pt reports he has at baseline.  Pt says he only ambulates ~20-30 ft at a time at home to go to the bathroom or walk across the room and he feels comfortable doing this independently w/ his RW at this time.  Pt remains appropriate for return home w/ HHPT following d/c from a PT perspective.    Follow Up Recommendations  Home health PT;Supervision - Intermittent     Equipment Recommendations  Other (comment) (rollator)    Recommendations for Other Services       Precautions / Restrictions Precautions Precautions: Fall Restrictions Weight Bearing Restrictions: Yes LLE Weight Bearing: Weight bearing as tolerated    Mobility  Bed Mobility Overal bed mobility: Needs Assistance Bed Mobility: Supine to Sit     Supine to sit: Supervision     General bed mobility comments: Supervision for safety  Transfers Overall transfer level: Needs assistance Equipment used: Rolling walker (2 wheeled) Transfers: Sit to/from Stand Sit to Stand: Supervision         General transfer comment: Supervision for safety  Ambulation/Gait Ambulation/Gait assistance: Supervision Ambulation Distance (Feet): 40 Feet Assistive device: Rolling walker (2 wheeled) Gait  Pattern/deviations: Step-through pattern;Antalgic;Decreased stance time - right;Decreased stride length   Gait velocity interpretation: Below normal speed for age/gender General Gait Details: continuous O2 with gait; Pt reports lightheadedness after amb 20 ft which he said is his baseline, "I get lightheaded whenver I am up and moving".  Pt took standing rest break at this time and PT emphasized importance of safety and not challenging himself when he is lightheaded.   Stairs            Wheelchair Mobility    Modified Rankin (Stroke Patients Only)       Balance Overall balance assessment: History of Falls;Needs assistance Sitting-balance support: Feet supported;No upper extremity supported Sitting balance-Leahy Scale: Good     Standing balance support: Bilateral upper extremity supported Standing balance-Leahy Scale: Fair                      Cognition Arousal/Alertness: Awake/alert Behavior During Therapy: WFL for tasks assessed/performed Overall Cognitive Status: Within Functional Limits for tasks assessed                      Exercises General Exercises - Lower Extremity Quad Sets: AROM;Both;10 reps;Supine Long Arc Quad: Both;10 reps;Seated;AROM Straight Leg Raises: AROM;Left;5 reps;Supine    General Comments        Pertinent Vitals/Pain Pain Assessment: 0-10 Pain Score: 3  Pain Location: L hip Pain Descriptors / Indicators: Aching;Dull Pain Intervention(s): Limited activity within patient's tolerance;Monitored during session;Repositioned    Home Living  Prior Function            PT Goals (current goals can now be found in the care plan section) Acute Rehab PT Goals Patient Stated Goal: to get home to my independence Progress towards PT goals: Progressing toward goals    Frequency  Min 6X/week    PT Plan Current plan remains appropriate    Co-evaluation             End of Session Equipment  Utilized During Treatment: Gait belt;Oxygen (4 LPM) Activity Tolerance: Patient limited by fatigue (lightheaded) Patient left: in chair;with call bell/phone within reach (w/ OT in room)     Time: [id number] PT Time Calculation (min) (ACUTE ONLY): 23 min  Charges:  $Gait Training: 8-22 mins $Therapeutic Exercise: 8-22 mins                    G CodesMichail Jewels:      Ashley Parr PT, [place] [phone] 06/27/2014, 11:10 AM

## 2014-06-27 NOTE — Progress Notes (Signed)
    Subjective:  No chest pain or shortness of breath.  Objective:  Vital Signs in the last 24 hours: Temp:  [97.4 F (36.3 C)-97.7 F (36.5 C)] 97.6 F (36.4 C) (03/21 0744) Pulse Rate:  [90-107] 93 (03/21 1223) Resp:  [16-18] 16 (03/21 0744) BP: (100-129)/(64-76) 129/75 mmHg (03/21 0922) SpO2:  [92 %-99 %] 97 % (03/21 1019)  Intake/Output from previous day: 03/20 0701 - 03/21 0700 In: 360 [P.O.:360] Out: 1600 [Urine:1600]  Physical Exam: Pt is alert and oriented, NAD HEENT: normal Neck: JVP - normal Lungs: CTA bilaterally CV: RRR with 2/6 SEM at the RUSB Abd: soft, NT, Positive BS, no hepatomegaly Ext: no C/C/E, distal pulses intact and equal Skin: warm/dry no rash   Lab Results:  Recent Labs  06/26/14 0530 06/27/14 0705  WBC 13.5* 9.8  HGB 10.5* 11.0*  PLT 216 219    Recent Labs  06/26/14 0530 06/27/14 0705  NA 136 140  K 4.2 4.0  CL 98 101  CO2 30 32  GLUCOSE 241* 159*  BUN 20 26*  CREATININE 0.86 0.95   No results for input(s): TROPONINI in the last 72 hours.  Invalid input(s): CK, MB  Tele: Sinus rhythm, sinus tach  Assessment/Plan:  1. Chronic systolic CHF, compensated 2. Ischemic cardiomyopathy, severe 3. S/p Hip fx  Appears stable post-operatively. Tolerating home regimen of ASA, digoxin, carvedilol, and isosorbide. Resume clopidogrel if ok with surgical team. Should follow-up with Dr Wyline MoodBranch or [place]ryn [place] - will arrange. thx  Tonny BollmanMichael Sabah Zucco, M.D. 06/27/2014, 12:45 PM

## 2014-06-27 NOTE — Progress Notes (Signed)
   Subjective:  Patient reports pain as mild.  No events.  Objective:   VITALS:   Filed Vitals:   06/26/14 2205 06/27/14 0000 06/27/14 0400 06/27/14 0744  BP: 100/76   129/76  Pulse: 107   95  Temp: 97.4 F (36.3 C)   97.6 F (36.4 C)  TempSrc: Oral   Oral  Resp: 18 18 18 16   Height:      Weight:      SpO2: 92% 92% 92% 98%    Neurologically intact Neurovascular intact Sensation intact distally Intact pulses distally Dorsiflexion/Plantar flexion intact Incision: dressing C/D/I and no drainage No cellulitis present Compartment soft   Lab Results  Component Value Date   WBC 13.5* 06/26/2014   HGB 10.5* 06/26/2014   HCT 31.9* 06/26/2014   MCV 87.4 06/26/2014   PLT 216 06/26/2014     Assessment/Plan:  2 Days Post-Op   - Expected postop acute blood loss anemia - will monitor for symptoms - Up with PT/OT - DVT ppx - SCDs, ambulation, lovenox - WBAT left lower extremity - Pain controlled  Cheral AlmasXu, Tiyah Zelenak Michael 06/27/2014, 7:51 AM [phone]

## 2014-06-27 NOTE — Progress Notes (Signed)
Occupational Therapy Treatment Patient Details Name: Timothy Trujillo MRN: [id number] DOB: 1950-12-12 Today's Date: 06/27/2014    History of present illness Timothy Trujillo is a 64 y.o. male with history of end-stage cardiomyopathy last EF measured was 20-25% in January 2016, CAD status post CABG, severe COPD on home oxygen, chronic tachycardia, diabetes mellitus type 2 had a fall 2 days ago when patient was trying to go to the bathroom. Patient does not recall the exact circumstances of the fall. Since the patient's pain did not improve patient came to the ER 06/23/14 where xrays revealed L hip fx. Pt underwent ORIF 06/25/14   OT comments  Patient progressing nicely towards goals, continue plan of care for now. Practiced tub/shower transfer using tub transfer bench with patient, patient supervision for this. Patient states that his grandson will be present during bathing and dressing tasks. Patient is okay to perform toilet transfers/toileting at mod I level at home. Patient states he uses a wheelchair for kitchen tasks when he is alone, feel this is very appropriate.    Follow Up Recommendations  No OT follow up;Supervision - Intermittent    Equipment Recommendations  None recommended by OT    Recommendations for Other Services  None at this time.   Precautions / Restrictions Precautions Precautions: Fall Restrictions Weight Bearing Restrictions: Yes LLE Weight Bearing: Weight bearing as tolerated    Mobility Bed Mobility Overal bed mobility: Needs Assistance Bed Mobility: Supine to Sit     Supine to sit: Supervision     General bed mobility comments: Supervision for safety  Transfers Overall transfer level: Needs assistance Equipment used: Rolling walker (2 wheeled) Transfers: Sit to/from Stand Sit to Stand: Supervision         General transfer comment: Supervision for safety    Balance Overall balance assessment: History of Falls Sitting-balance support: Feet  supported;No upper extremity supported Sitting balance-Leahy Scale: Good     Standing balance support: Bilateral upper extremity supported Standing balance-Leahy Scale: Fair    ADL  General ADL Comments: Patient able to reach BLEs for LB ADLs. Patient states he has intermittent supervision at home, discussed importance of supervision during ADL tasks. Patient states he uses a wheelchair for IADL tasks, agree that this is his best option. He is on up to 4 liters of 02(chronic).       Cognition   Behavior During Therapy: WFL for tasks assessed/performed Overall Cognitive Status: Within Functional Limits for tasks assessed                 Pertinent Vitals/ Pain       Pain Assessment: 0-10 Pain Score: 4  Pain Location: left hip and lower back Pain Descriptors / Indicators: Aching;Dull Pain Intervention(s): Monitored during session;Repositioned         Frequency Min 2X/week     Progress Toward Goals  OT Goals(current goals can now be found in the care plan section)  Progress towards OT goals: Progressing toward goals  Acute Rehab OT Goals Patient Stated Goal: to get home to my independence  Plan Discharge plan remains appropriate       End of Session Equipment Utilized During Treatment: Rolling walker;Oxygen   Activity Tolerance Patient tolerated treatment well   Patient Left in chair;with call bell/phone within reach     Time: 3244-0102 OT Time Calculation (min): 23 min  Charges: OT General Charges $OT Visit: 1 Procedure OT Treatments $Self Care/Home Management : 23-37 mins  Marvon Shillingburg , MS, OTR/L, CLT Pager:  [phone]  06/27/2014, 11:27 AM

## 2014-06-27 NOTE — Care Management Note (Signed)
CARE MANAGEMENT NOTE 06/27/2014  Patient:  Timothy Trujillo,Livan Trujillo   Account Number:  [id number]  Date Initiated:  06/27/2014  Documentation initiated by:  [facility]RIEG,MARY  Subjective/Objective Assessment:   fell, left femoral neck fracture, s/p left femur pinning 03/19     Action/Plan:   PT/OT evals-recommended HHPT  CM [facility] Patient has confirmed he has been previously setup with them, no changes.   Anticipated DC Date:  06/28/2014   Anticipated DC Plan:  HOME W HOME HEALTH SERVICES      DC Planning Services  CM consult      [facility]   Choice offered to / List presented to:  C-1 Patient        HH arranged  HH-2 PT      [facility]   Status of service:  Completed, signed off Medicare Important Message given?  YES (If response is "NO", the following Medicare IM given date fields will be blank) Date Medicare IM given:  06/27/2014 Medicare IM given by:  Vance PeperBRADY,Twyla Dais Date Additional Medicare IM given:   Additional Medicare IM given by:    Discharge Disposition:  HOME W HOME HEALTH SERVICES  Per UR Regulation:  Reviewed for med. necessity/level of care/duration of stay

## 2014-06-28 ENCOUNTER — Other Ambulatory Visit: Payer: Self-pay | Admitting: *Deleted

## 2014-06-28 ENCOUNTER — Encounter (HOSPITAL_COMMUNITY): Payer: Self-pay | Admitting: Orthopaedic Surgery

## 2014-06-28 LAB — HEMOGLOBIN A1C
HEMOGLOBIN A1C: 10.8 % — AB (ref 4.8–5.6)
Mean Plasma Glucose: 263 mg/dL

## 2014-06-30 ENCOUNTER — Encounter: Payer: Self-pay | Admitting: Adult Health

## 2014-07-05 ENCOUNTER — Inpatient Hospital Stay (HOSPITAL_COMMUNITY)
Admission: EM | Admit: 2014-07-05 | Discharge: 2014-08-07 | DRG: 291 | Disposition: E | Payer: Commercial Managed Care - HMO | Attending: Pulmonary Disease | Admitting: Pulmonary Disease

## 2014-07-05 ENCOUNTER — Emergency Department (HOSPITAL_COMMUNITY): Payer: Commercial Managed Care - HMO

## 2014-07-05 ENCOUNTER — Encounter (HOSPITAL_COMMUNITY): Payer: Self-pay | Admitting: Emergency Medicine

## 2014-07-05 DIAGNOSIS — Z79899 Other long term (current) drug therapy: Secondary | ICD-10-CM

## 2014-07-05 DIAGNOSIS — E1165 Type 2 diabetes mellitus with hyperglycemia: Secondary | ICD-10-CM

## 2014-07-05 DIAGNOSIS — S72009A Fracture of unspecified part of neck of unspecified femur, initial encounter for closed fracture: Secondary | ICD-10-CM | POA: Diagnosis present

## 2014-07-05 DIAGNOSIS — J9600 Acute respiratory failure, unspecified whether with hypoxia or hypercapnia: Secondary | ICD-10-CM | POA: Diagnosis not present

## 2014-07-05 DIAGNOSIS — J849 Interstitial pulmonary disease, unspecified: Secondary | ICD-10-CM | POA: Diagnosis present

## 2014-07-05 DIAGNOSIS — E1129 Type 2 diabetes mellitus with other diabetic kidney complication: Secondary | ICD-10-CM | POA: Diagnosis present

## 2014-07-05 DIAGNOSIS — J449 Chronic obstructive pulmonary disease, unspecified: Secondary | ICD-10-CM | POA: Diagnosis not present

## 2014-07-05 DIAGNOSIS — E11649 Type 2 diabetes mellitus with hypoglycemia without coma: Secondary | ICD-10-CM | POA: Diagnosis not present

## 2014-07-05 DIAGNOSIS — E2740 Unspecified adrenocortical insufficiency: Secondary | ICD-10-CM | POA: Diagnosis present

## 2014-07-05 DIAGNOSIS — F418 Other specified anxiety disorders: Secondary | ICD-10-CM | POA: Diagnosis present

## 2014-07-05 DIAGNOSIS — E876 Hypokalemia: Secondary | ICD-10-CM | POA: Diagnosis not present

## 2014-07-05 DIAGNOSIS — J45909 Unspecified asthma, uncomplicated: Secondary | ICD-10-CM | POA: Diagnosis present

## 2014-07-05 DIAGNOSIS — J189 Pneumonia, unspecified organism: Secondary | ICD-10-CM | POA: Diagnosis present

## 2014-07-05 DIAGNOSIS — Z7952 Long term (current) use of systemic steroids: Secondary | ICD-10-CM | POA: Diagnosis not present

## 2014-07-05 DIAGNOSIS — Z794 Long term (current) use of insulin: Secondary | ICD-10-CM | POA: Diagnosis not present

## 2014-07-05 DIAGNOSIS — Z87891 Personal history of nicotine dependence: Secondary | ICD-10-CM | POA: Diagnosis not present

## 2014-07-05 DIAGNOSIS — I739 Peripheral vascular disease, unspecified: Secondary | ICD-10-CM | POA: Diagnosis present

## 2014-07-05 DIAGNOSIS — D638 Anemia in other chronic diseases classified elsewhere: Secondary | ICD-10-CM | POA: Diagnosis present

## 2014-07-05 DIAGNOSIS — Z885 Allergy status to narcotic agent status: Secondary | ICD-10-CM

## 2014-07-05 DIAGNOSIS — E785 Hyperlipidemia, unspecified: Secondary | ICD-10-CM | POA: Diagnosis present

## 2014-07-05 DIAGNOSIS — Z8249 Family history of ischemic heart disease and other diseases of the circulatory system: Secondary | ICD-10-CM

## 2014-07-05 DIAGNOSIS — IMO0002 Reserved for concepts with insufficient information to code with codable children: Secondary | ICD-10-CM | POA: Diagnosis present

## 2014-07-05 DIAGNOSIS — Z955 Presence of coronary angioplasty implant and graft: Secondary | ICD-10-CM

## 2014-07-05 DIAGNOSIS — T380X5A Adverse effect of glucocorticoids and synthetic analogues, initial encounter: Secondary | ICD-10-CM | POA: Diagnosis present

## 2014-07-05 DIAGNOSIS — I5023 Acute on chronic systolic (congestive) heart failure: Secondary | ICD-10-CM | POA: Diagnosis not present

## 2014-07-05 DIAGNOSIS — Z7902 Long term (current) use of antithrombotics/antiplatelets: Secondary | ICD-10-CM

## 2014-07-05 DIAGNOSIS — Z951 Presence of aortocoronary bypass graft: Secondary | ICD-10-CM

## 2014-07-05 DIAGNOSIS — Z515 Encounter for palliative care: Secondary | ICD-10-CM

## 2014-07-05 DIAGNOSIS — Z7951 Long term (current) use of inhaled steroids: Secondary | ICD-10-CM | POA: Diagnosis not present

## 2014-07-05 DIAGNOSIS — G9341 Metabolic encephalopathy: Secondary | ICD-10-CM | POA: Diagnosis not present

## 2014-07-05 DIAGNOSIS — S72002D Fracture of unspecified part of neck of left femur, subsequent encounter for closed fracture with routine healing: Secondary | ICD-10-CM

## 2014-07-05 DIAGNOSIS — I499 Cardiac arrhythmia, unspecified: Secondary | ICD-10-CM | POA: Diagnosis not present

## 2014-07-05 DIAGNOSIS — R079 Chest pain, unspecified: Secondary | ICD-10-CM

## 2014-07-05 DIAGNOSIS — Z4659 Encounter for fitting and adjustment of other gastrointestinal appliance and device: Secondary | ICD-10-CM

## 2014-07-05 DIAGNOSIS — I509 Heart failure, unspecified: Secondary | ICD-10-CM | POA: Diagnosis not present

## 2014-07-05 DIAGNOSIS — M1990 Unspecified osteoarthritis, unspecified site: Secondary | ICD-10-CM | POA: Diagnosis present

## 2014-07-05 DIAGNOSIS — Z9981 Dependence on supplemental oxygen: Secondary | ICD-10-CM | POA: Diagnosis not present

## 2014-07-05 DIAGNOSIS — J9601 Acute respiratory failure with hypoxia: Secondary | ICD-10-CM | POA: Diagnosis present

## 2014-07-05 DIAGNOSIS — I255 Ischemic cardiomyopathy: Secondary | ICD-10-CM | POA: Diagnosis present

## 2014-07-05 DIAGNOSIS — I2510 Atherosclerotic heart disease of native coronary artery without angina pectoris: Secondary | ICD-10-CM | POA: Diagnosis present

## 2014-07-05 DIAGNOSIS — Z4689 Encounter for fitting and adjustment of other specified devices: Secondary | ICD-10-CM

## 2014-07-05 DIAGNOSIS — I10 Essential (primary) hypertension: Secondary | ICD-10-CM | POA: Diagnosis present

## 2014-07-05 DIAGNOSIS — Z79891 Long term (current) use of opiate analgesic: Secondary | ICD-10-CM | POA: Diagnosis not present

## 2014-07-05 DIAGNOSIS — I480 Paroxysmal atrial fibrillation: Secondary | ICD-10-CM | POA: Diagnosis present

## 2014-07-05 DIAGNOSIS — E873 Alkalosis: Secondary | ICD-10-CM | POA: Diagnosis not present

## 2014-07-05 DIAGNOSIS — Z66 Do not resuscitate: Secondary | ICD-10-CM | POA: Diagnosis present

## 2014-07-05 DIAGNOSIS — I472 Ventricular tachycardia: Secondary | ICD-10-CM | POA: Diagnosis not present

## 2014-07-05 DIAGNOSIS — R748 Abnormal levels of other serum enzymes: Secondary | ICD-10-CM | POA: Diagnosis present

## 2014-07-05 DIAGNOSIS — Z888 Allergy status to other drugs, medicaments and biological substances status: Secondary | ICD-10-CM | POA: Diagnosis not present

## 2014-07-05 DIAGNOSIS — R0602 Shortness of breath: Secondary | ICD-10-CM

## 2014-07-05 DIAGNOSIS — R0600 Dyspnea, unspecified: Secondary | ICD-10-CM

## 2014-07-05 DIAGNOSIS — Z01818 Encounter for other preprocedural examination: Secondary | ICD-10-CM

## 2014-07-05 DIAGNOSIS — T148XXA Other injury of unspecified body region, initial encounter: Secondary | ICD-10-CM

## 2014-07-05 DIAGNOSIS — Z7982 Long term (current) use of aspirin: Secondary | ICD-10-CM | POA: Diagnosis not present

## 2014-07-05 DIAGNOSIS — Y95 Nosocomial condition: Secondary | ICD-10-CM | POA: Diagnosis present

## 2014-07-05 DIAGNOSIS — J9690 Respiratory failure, unspecified, unspecified whether with hypoxia or hypercapnia: Secondary | ICD-10-CM

## 2014-07-05 DIAGNOSIS — I959 Hypotension, unspecified: Secondary | ICD-10-CM | POA: Diagnosis not present

## 2014-07-05 DIAGNOSIS — R570 Cardiogenic shock: Secondary | ICD-10-CM | POA: Diagnosis not present

## 2014-07-05 DIAGNOSIS — J9621 Acute and chronic respiratory failure with hypoxia: Secondary | ICD-10-CM | POA: Diagnosis present

## 2014-07-05 DIAGNOSIS — Z8673 Personal history of transient ischemic attack (TIA), and cerebral infarction without residual deficits: Secondary | ICD-10-CM | POA: Diagnosis not present

## 2014-07-05 DIAGNOSIS — Z9889 Other specified postprocedural states: Secondary | ICD-10-CM

## 2014-07-05 LAB — CBC WITH DIFFERENTIAL/PLATELET
Basophils Absolute: 0 10*3/uL (ref 0.0–0.1)
Basophils Relative: 0 % (ref 0–1)
EOS ABS: 0.3 10*3/uL (ref 0.0–0.7)
Eosinophils Relative: 3 % (ref 0–5)
HCT: 31.8 % — ABNORMAL LOW (ref 39.0–52.0)
HEMOGLOBIN: 10.3 g/dL — AB (ref 13.0–17.0)
Lymphocytes Relative: 9 % — ABNORMAL LOW (ref 12–46)
Lymphs Abs: 1.1 10*3/uL (ref 0.7–4.0)
MCH: 28.9 pg (ref 26.0–34.0)
MCHC: 32.4 g/dL (ref 30.0–36.0)
MCV: 89.1 fL (ref 78.0–100.0)
MONOS PCT: 6 % (ref 3–12)
Monocytes Absolute: 0.7 10*3/uL (ref 0.1–1.0)
NEUTROS ABS: 10.3 10*3/uL — AB (ref 1.7–7.7)
NEUTROS PCT: 82 % — AB (ref 43–77)
Platelets: 269 10*3/uL (ref 150–400)
RBC: 3.57 MIL/uL — ABNORMAL LOW (ref 4.22–5.81)
RDW: 14.5 % (ref 11.5–15.5)
WBC: 12.5 10*3/uL — AB (ref 4.0–10.5)

## 2014-07-05 LAB — DIGOXIN LEVEL: Digoxin Level: <0.2 ng/mL — ABNORMAL LOW (ref 0.8–2.0)

## 2014-07-05 LAB — BASIC METABOLIC PANEL
Anion gap: 11 (ref 5–15)
BUN: 18 mg/dL (ref 6–23)
CALCIUM: 8.1 mg/dL — AB (ref 8.4–10.5)
CHLORIDE: 95 mmol/L — AB (ref 96–112)
CO2: 31 mmol/L (ref 19–32)
Creatinine, Ser: 1.04 mg/dL (ref 0.50–1.35)
GFR calc Af Amer: 85 mL/min — ABNORMAL LOW (ref >90)
GFR calc non Af Amer: 73 mL/min — ABNORMAL LOW (ref >90)
Glucose, Bld: 255 mg/dL — ABNORMAL HIGH (ref 70–99)
POTASSIUM: 4.4 mmol/L (ref 3.5–5.1)
Sodium: 137 mmol/L (ref 135–145)

## 2014-07-05 LAB — TROPONIN I
TROPONIN I: 0.12 ng/mL — AB (ref <0.031)
Troponin I: 0.06 ng/mL — ABNORMAL HIGH (ref <0.031)

## 2014-07-05 LAB — PROTIME-INR
INR: 1.06 (ref 0.00–1.49)
PROTHROMBIN TIME: 13.9 s (ref 11.6–15.2)

## 2014-07-05 LAB — MAGNESIUM: Magnesium: 1.1 mg/dL — ABNORMAL LOW (ref 1.5–2.5)

## 2014-07-05 LAB — BRAIN NATRIURETIC PEPTIDE: B Natriuretic Peptide: 934 pg/mL — ABNORMAL HIGH (ref 0.0–100.0)

## 2014-07-05 LAB — GLUCOSE, CAPILLARY: Glucose-Capillary: 179 mg/dL — ABNORMAL HIGH (ref 70–99)

## 2014-07-05 MED ORDER — ASPIRIN 325 MG PO TABS
325.0000 mg | ORAL_TABLET | Freq: Every day | ORAL | Status: DC
  Administered 2014-07-05 – 2014-07-11 (×7): 325 mg via ORAL
  Filled 2014-07-05 (×7): qty 1

## 2014-07-05 MED ORDER — OXYCODONE HCL 5 MG PO TABS
5.0000 mg | ORAL_TABLET | ORAL | Status: DC | PRN
  Administered 2014-07-05 – 2014-07-10 (×12): 5 mg via ORAL
  Filled 2014-07-05 (×12): qty 1

## 2014-07-05 MED ORDER — ACETAMINOPHEN 650 MG RE SUPP: 650.0000 mg | Freq: Four times a day (QID) | RECTAL | Status: DC | PRN

## 2014-07-05 MED ORDER — INSULIN ASPART 100 UNIT/ML ~~LOC~~ SOLN
0.0000 [IU] | Freq: Three times a day (TID) | SUBCUTANEOUS | Status: DC
  Administered 2014-07-06: 7 [IU] via SUBCUTANEOUS
  Administered 2014-07-06: 11 [IU] via SUBCUTANEOUS
  Administered 2014-07-06 – 2014-07-07 (×2): 4 [IU] via SUBCUTANEOUS
  Administered 2014-07-07: 15 [IU] via SUBCUTANEOUS
  Administered 2014-07-07: 11 [IU] via SUBCUTANEOUS

## 2014-07-05 MED ORDER — BUDESONIDE 0.25 MG/2ML IN SUSP
0.2500 mg | Freq: Two times a day (BID) | RESPIRATORY_TRACT | Status: DC
  Administered 2014-07-05 – 2014-07-08 (×6): 0.25 mg via RESPIRATORY_TRACT
  Filled 2014-07-05 (×8): qty 2

## 2014-07-05 MED ORDER — LEVALBUTEROL HCL 0.63 MG/3ML IN NEBU: 0.6300 mg | INHALATION_SOLUTION | Freq: Four times a day (QID) | RESPIRATORY_TRACT | Status: DC | PRN

## 2014-07-05 MED ORDER — OXYCODONE-ACETAMINOPHEN 5-325 MG PO TABS
1.0000 | ORAL_TABLET | ORAL | Status: DC | PRN
  Administered 2014-07-06 – 2014-07-09 (×8): 1 via ORAL
  Filled 2014-07-05 (×8): qty 1

## 2014-07-05 MED ORDER — BUDESONIDE 0.25 MG/2ML IN SUSP
RESPIRATORY_TRACT | Status: AC
  Filled 2014-07-05: qty 2

## 2014-07-05 MED ORDER — ACETAMINOPHEN 325 MG PO TABS: 650.0000 mg | ORAL_TABLET | ORAL | Status: DC | PRN

## 2014-07-05 MED ORDER — LISINOPRIL 5 MG PO TABS: 2.5000 mg | ORAL_TABLET | Freq: Every day | ORAL | Status: DC

## 2014-07-05 MED ORDER — ONDANSETRON HCL 4 MG PO TABS: 4.0000 mg | ORAL_TABLET | Freq: Four times a day (QID) | ORAL | Status: DC | PRN

## 2014-07-05 MED ORDER — OXYCODONE-ACETAMINOPHEN 10-325 MG PO TABS: 0.5000 | ORAL_TABLET | ORAL | Status: DC | PRN

## 2014-07-05 MED ORDER — ENSURE ENLIVE PO LIQD
237.0000 mL | Freq: Two times a day (BID) | ORAL | Status: DC
  Administered 2014-07-06: 237 mL via ORAL

## 2014-07-05 MED ORDER — HEPARIN SODIUM (PORCINE) 5000 UNIT/ML IJ SOLN: 5000.0000 [IU] | Freq: Three times a day (TID) | INTRAMUSCULAR | Status: DC

## 2014-07-05 MED ORDER — GI COCKTAIL ~~LOC~~: 30.0000 mL | Freq: Four times a day (QID) | ORAL | Status: DC | PRN

## 2014-07-05 MED ORDER — FUROSEMIDE 10 MG/ML IJ SOLN
40.0000 mg | Freq: Once | INTRAMUSCULAR | Status: AC
  Administered 2014-07-05: 40 mg via INTRAVENOUS
  Filled 2014-07-05: qty 4

## 2014-07-05 MED ORDER — ISOSORBIDE MONONITRATE ER 60 MG PO TB24
60.0000 mg | ORAL_TABLET | Freq: Every day | ORAL | Status: DC
  Administered 2014-07-06 – 2014-07-09 (×4): 60 mg via ORAL
  Filled 2014-07-05 (×4): qty 1

## 2014-07-05 MED ORDER — SERTRALINE HCL 50 MG PO TABS
50.0000 mg | ORAL_TABLET | Freq: Every day | ORAL | Status: DC
  Administered 2014-07-05 – 2014-07-10 (×6): 50 mg via ORAL
  Filled 2014-07-05 (×6): qty 1

## 2014-07-05 MED ORDER — ALPRAZOLAM 0.25 MG PO TABS
0.2500 mg | ORAL_TABLET | Freq: Two times a day (BID) | ORAL | Status: DC | PRN
  Administered 2014-07-05 – 2014-07-08 (×4): 0.25 mg via ORAL
  Filled 2014-07-05 (×4): qty 1

## 2014-07-05 MED ORDER — DIGOXIN 250 MCG PO TABS
0.2500 mg | ORAL_TABLET | Freq: Every day | ORAL | Status: DC
  Administered 2014-07-06 – 2014-07-07 (×2): 0.25 mg via ORAL
  Filled 2014-07-05 (×4): qty 1

## 2014-07-05 MED ORDER — HEPARIN (PORCINE) IN NACL 100-0.45 UNIT/ML-% IJ SOLN
1800.0000 [IU]/h | INTRAMUSCULAR | Status: DC
Start: 2014-07-05 — End: 2014-07-06
  Administered 2014-07-05: 1500 [IU]/h via INTRAVENOUS
  Administered 2014-07-06: 1800 [IU]/h via INTRAVENOUS
  Filled 2014-07-05 (×3): qty 250

## 2014-07-05 MED ORDER — CARVEDILOL 6.25 MG PO TABS
6.2500 mg | ORAL_TABLET | Freq: Two times a day (BID) | ORAL | Status: DC
  Administered 2014-07-05 – 2014-07-06 (×2): 6.25 mg via ORAL
  Filled 2014-07-05 (×2): qty 1
  Filled 2014-07-05: qty 2
  Filled 2014-07-05: qty 1

## 2014-07-05 MED ORDER — CLOPIDOGREL BISULFATE 75 MG PO TABS
75.0000 mg | ORAL_TABLET | Freq: Every day | ORAL | Status: DC
  Administered 2014-07-06 – 2014-07-11 (×6): 75 mg via ORAL
  Filled 2014-07-05 (×6): qty 1

## 2014-07-05 MED ORDER — ACETAMINOPHEN 325 MG PO TABS: 650.0000 mg | ORAL_TABLET | Freq: Four times a day (QID) | ORAL | Status: DC | PRN

## 2014-07-05 MED ORDER — SODIUM CHLORIDE 0.9 % IJ SOLN
3.0000 mL | Freq: Two times a day (BID) | INTRAMUSCULAR | Status: DC
  Administered 2014-07-05 – 2014-07-09 (×8): 3 mL via INTRAVENOUS

## 2014-07-05 MED ORDER — IOHEXOL 350 MG/ML SOLN
100.0000 mL | Freq: Once | INTRAVENOUS | Status: AC | PRN
  Administered 2014-07-05: 100 mL via INTRAVENOUS

## 2014-07-05 MED ORDER — MORPHINE SULFATE 4 MG/ML IJ SOLN
4.0000 mg | Freq: Once | INTRAMUSCULAR | Status: AC
  Administered 2014-07-05: 4 mg via INTRAVENOUS
  Filled 2014-07-05: qty 1

## 2014-07-05 MED ORDER — ONDANSETRON HCL 4 MG/2ML IJ SOLN: 4.0000 mg | Freq: Four times a day (QID) | INTRAMUSCULAR | Status: DC | PRN

## 2014-07-05 MED ORDER — FUROSEMIDE 10 MG/ML IJ SOLN: 80.0000 mg | Freq: Two times a day (BID) | INTRAMUSCULAR | Status: DC

## 2014-07-05 MED ORDER — CARVEDILOL 3.125 MG PO TABS: 6.2500 mg | ORAL_TABLET | Freq: Two times a day (BID) | ORAL | Status: DC

## 2014-07-05 MED ORDER — NITROGLYCERIN 0.4 MG SL SUBL
SUBLINGUAL_TABLET | SUBLINGUAL | Status: AC
  Administered 2014-07-05: 0.4 mg
  Filled 2014-07-05: qty 1

## 2014-07-05 MED ORDER — POTASSIUM CHLORIDE CRYS ER 20 MEQ PO TBCR
20.0000 meq | EXTENDED_RELEASE_TABLET | Freq: Two times a day (BID) | ORAL | Status: DC
  Administered 2014-07-05 – 2014-07-06 (×3): 20 meq via ORAL
  Filled 2014-07-05 (×5): qty 1

## 2014-07-05 MED ORDER — INSULIN ASPART 100 UNIT/ML ~~LOC~~ SOLN
0.0000 [IU] | Freq: Every day | SUBCUTANEOUS | Status: DC
  Administered 2014-07-06: 4 [IU] via SUBCUTANEOUS
  Administered 2014-07-07: 3 [IU] via SUBCUTANEOUS

## 2014-07-05 MED ORDER — HEPARIN BOLUS VIA INFUSION
4000.0000 [IU] | Freq: Once | INTRAVENOUS | Status: AC
  Administered 2014-07-05: 4000 [IU] via INTRAVENOUS

## 2014-07-05 MED ORDER — ATORVASTATIN CALCIUM 40 MG PO TABS
80.0000 mg | ORAL_TABLET | Freq: Every day | ORAL | Status: DC
  Administered 2014-07-05 – 2014-07-10 (×6): 80 mg via ORAL
  Filled 2014-07-05 (×7): qty 2

## 2014-07-05 NOTE — ED Notes (Signed)
Pt's IV's wrapped at pt request

## 2014-07-05 NOTE — H&P (Signed)
[facility] History and Physical  Timothy Trujillo UEA:[id number] DOB: 05-08-1950 DOA: 06/21/2014  Referring physician: Dr Rubin Payor - [facility] PCP: Fredirick Maudlin, MD   Chief Complaint: CP and SOB  HPI: Timothy Trujillo is a 64 y.o. male  Chest pain. Started around 12:00 Described as chest tightness. Associated with shortness of breath, lightheaded, chilled and w/ radiation to the neck. SOB for the previous 2 days. Home O2 as low as 71% and very irregular heart beat noted at home. Over the past couple of days pt feels like he has been accumulating fluid since the surgery. On 4L at hom but has increased O2 over past couple of days.  Nitroglycerin and aspirin by EMS in route to ED with some relief. Patient reports left hip surgery one week ago  Lasix 80 Qday w/o much benefit. Increased to BID w/o much increase in UOP  Dis[facility] from [facility] on [facility]14 after surgical repair of hip from fracture.   Review of Systems:  Constitutional: No weight loss, night sweats, Fevers, chills,  HEENT:  No headaches, Difficulty swallowing,Tooth/dental problems,Sore throat,  No sneezing, itching, ear ache, nasal congestion, post nasal drip,  Cardio-vascular: Per HPI GI:  No heartburn, indigestion, abdominal pain, vomiting, diarrhea, change in bowel habits, loss of appetite  Resp: Per HPI Skin:  no rash or lesions.  GU:  no dysuria, change in color of urine, no urgency or frequency. No flank pain.  Musculoskeletal:   No joint pain or swelling. No decreased range of motion. No back pain.  Psych:  No change in mood or affect. No depression or anxiety. No memory loss.   Past Medical History  Diagnosis Date  . Uncontrolled diabetes mellitus     a. A1C 12.8 in 01/2013.  [name] Chronic systolic heart failure   . Obesity   . Hyperlipidemia   . Peripheral vascular disease   . Hypertension   . Asthma   . CAD (coronary artery disease)     a. Prior CABG 20 yrs ago (?~1994). b. Hx of multiple stents,  prev followed in [place]. c. [place] 01/2013: cath moderate disease in LAD, distal LCx, prox RCA occluded, 2 SVGs occluded, felt to be stable from prior cath -> for medical therapy.  . Ischemic cardiomyopathy     a. EF 2012: 55-60. b. EF 12/2011: 25-30%. c. Echo 01/2013: EF 15-20%.  . Stroke     15 years ago  . Arthritis   . Anxiety   . Depression   . 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 Chronic respiratory failure     a. Due to COPD.  . Osteomyelitis 2013    was planned for chronic suppressive antibiotics but cannot afford this any longer  . CHF (congestive heart failure)   . Diabetes mellitus without complication    Past Surgical History  Procedure Laterality Date  . Coronary artery bypass graft    . Coronary stent placement  2008    CABG grafts closed  . Joint replacement    . Cholecystectomy    . Eye surgery    . I&d extremity  06/12/2011    Procedure: IRRIGATION AND DEBRIDEMENT EXTREMITY;  Surgeon: Javier Docker, MD;  Location: [facility] OR;  Service: Orthopedics;  Laterality: Right;  I&D Right [place]  . Left and right heart catheterization with coronary/graft angiogram N/A 01/15/2013    Procedure: LEFT AND RIGHT HEART CATHETERIZATION WITH CORONARY/GRAFT ANGIOGRAM;  Surgeon:  Alvia Grove, MD;  Location: [facility] CATH LAB;  Service: Cardiovascular;  Laterality: N/A;  . Hip pinning,cannulated Left 06/25/2014    Procedure: CANNULATED HIP PINNING;  Surgeon: Tarry Kos, MD;  Location: [facility] OR;  Service: Orthopedics;  Laterality: Left;   Social History:  reports that he quit smoking about 31 years ago. He started smoking about 57 years ago. He has never used smokeless tobacco. He reports that he does not drink alcohol or use illicit drugs.  Allergies  Allergen Reactions  . Baycol [Cerivastatin] Other (See Comments)    Patient states it "about killed me" kidney shut down. Black stools.  . Stadol [Butorphanol Tartrate] Other (See Comments)     "makes him crazy"    Family History  Problem Relation Age of Onset  . Dementia Mother   . Arthritis Mother   . Heart disease Father   . Heart disease Brother      Prior to Admission medications   Medication Sig Start Date End Date Taking? Authorizing Provider  acetaminophen (TYLENOL) 325 MG tablet Take 2 tablets (650 mg total) by mouth every 6 (six) hours as needed for mild pain (or Fever >/= 101). 06/09/14   Kari Baars, MD  ALPRAZolam Prudy Feeler) 0.25 MG tablet Take 1 tablet (0.25 mg total) by mouth 2 (two) times daily as needed for anxiety. 05/10/14   Kari Baars, MD  aspirin 325 MG tablet Take 325 mg by mouth daily.    Historical Provider, MD  atorvastatin (LIPITOR) 80 MG tablet Take 1 tablet (80 mg total) by mouth daily. 06/09/14   Kari Baars, MD  budesonide (PULMICORT) 0.25 MG/2ML nebulizer solution Take 2 mLs (0.25 mg total) by nebulization 2 (two) times daily. 06/09/14   Kari Baars, MD  carvedilol (COREG) 6.25 MG tablet Take 1 tablet (6.25 mg total) by mouth 2 (two) times daily. 04/25/14   Jodelle Gross, NP  clopidogrel (PLAVIX) 75 MG tablet Take 1 tablet (75 mg total) by mouth daily. 04/25/14   Jodelle Gross, NP  digoxin (LANOXIN) 0.25 MG tablet Take 1 tablet (0.25 mg total) by mouth daily. 06/09/14   Kari Baars, MD  diphenhydrAMINE (BENADRYL) 50 MG tablet Take 1 tablet (50 mg total) by mouth every 6 (six) hours as needed for itching. 05/10/14   Kari Baars, MD  feeding supplement, ENSURE COMPLETE, (ENSURE COMPLETE) LIQD Take 237 mLs by mouth 2 (two) times daily between meals. 06/09/14   Kari Baars, MD  hydrocortisone 2.5 % cream Apply 1 application topically daily as needed (itching).  04/25/14   Historical Provider, MD  insulin NPH Human (HUMULIN N,NOVOLIN N) 100 UNIT/ML injection Inject 0.4 mLs (40 Units total) into the skin daily before breakfast. Patient taking differently: Inject 110 Units into the skin 2 (two) times daily.  06/09/14   Kari Baars, MD  insulin  NPH Human (HUMULIN N,NOVOLIN N) 100 UNIT/ML injection Inject 0.2 mLs (20 Units total) into the skin at bedtime. Patient not taking: Reported on 06/23/2014 06/09/14   Kari Baars, MD  isosorbide mononitrate (IMDUR) 60 MG 24 hr tablet Take 1 tablet (60 mg total) by mouth daily. 04/25/14   Jodelle Gross, NP  levalbuterol Pauline Aus) 0.63 MG/3ML nebulizer solution Take 3 mLs (0.63 mg total) by nebulization every 6 (six) hours as needed for wheezing or shortness of breath. 06/09/14   Kari Baars, MD  nitroGLYCERIN (NITROSTAT) 0.4 MG SL tablet Place 1 tablet (0.4 mg total) under the tongue every 5 (five) minutes as needed for chest  pain. 04/25/14   Jodelle Gross, NP  ondansetron (ZOFRAN) 4 MG tablet Take 1 tablet (4 mg total) by mouth every 6 (six) hours as needed for nausea. 06/09/14   Kari Baars, MD  oxyCODONE (OXY IR/ROXICODONE) 5 MG immediate release tablet Take 1-3 tablets (5-15 mg total) by mouth every 4 (four) hours as needed. 06/25/14   Naiping Donnelly Stager, MD  oxyCODONE-acetaminophen (PERCOCET) 10-325 MG per tablet Take 1 tablet by mouth every 4 (four) hours as needed for pain.    Historical Provider, MD  polyethylene glycol (MIRALAX / GLYCOLAX) packet Take 17 g by mouth daily. 06/09/14   Kari Baars, MD  potassium chloride SA (K-DUR,KLOR-CON) 20 MEQ tablet Take 1 tablet (20 mEq total) by mouth 2 (two) times daily. 06/09/14   Kari Baars, MD  predniSONE (DELTASONE) 10 MG tablet Take 1 tablet (10 mg total) by mouth daily with breakfast. 06/09/14   Kari Baars, MD  sertraline (ZOLOFT) 50 MG tablet Take 1 tablet (50 mg total) by mouth daily. 05/10/14   Kari Baars, MD  torsemide (DEMADEX) 20 MG tablet Take 1 tablet (20 mg total) by mouth 2 (two) times daily. 06/09/14   Kari Baars, MD  vitamin B-12 (CYANOCOBALAMIN) 1000 MCG tablet Take 1,000 mcg by mouth daily.    Historical Provider, MD  vitamin C (ASCORBIC ACID) 500 MG tablet Take 1,000 mg by mouth at bedtime.    Historical Provider, MD    vitamin E 400 UNIT capsule Take 400 Units by mouth at bedtime.     Historical Provider, MD   Physical Exam: Filed Vitals:   2014-07-09 1830 2014-07-09 1900 07/09/14 1930 July 09, 2014 2000  BP: 142/117 137/86 128/88 114/79  Pulse: 114 105  116  Temp:      TempSrc:      Resp: 20 20 20 31   Height:      Weight:      SpO2: 100% 96%  100%    Wt Readings from Last 3 Encounters:  Jul 09, 2014 117.935 kg (260 lb)  06/23/14 104.327 kg (230 lb)  06/08/14 102.331 kg (225 lb 9.6 oz)    General:  Appears calm and comfortable Eyes:  PERRL, normal lids, irises & conjunctiva ENT:  grossly normal hearing, lips & tongue Neck:  no LAD, masses or thyromegaly Cardiovascular:  RRR, no m/r/g. Trace LE edema R, 1+ LE edema L Telemetry:  SR, no arrhythmias  Respiratory:  Crackles throughout lung bases bilaterally, normal effort, on 4 L nasal cannula, no wheezes, good air movement. Abdomen:  soft, ntnd Skin: Left hip surgical site with staples in place, wound clean dry and intact. Musculoskeletal:  grossly normal tone BUE/BLE Psychiatric:  grossly normal mood and affect, speech fluent and appropriate Neurologic:  grossly non-focal.          Labs on Admission:  Basic Metabolic Panel:  Recent Labs Lab July 09, 2014 1644  NA 137  K 4.4  CL 95*  CO2 31  GLUCOSE 255*  BUN 18  CREATININE 1.04  CALCIUM 8.1*   Liver Function Tests: No results for input(s): AST, ALT, ALKPHOS, BILITOT, PROT, ALBUMIN in the last 168 hours. No results for input(s): LIPASE, AMYLASE in the last 168 hours. No results for input(s): AMMONIA in the last 168 hours. CBC:  Recent Labs Lab Jul 09, 2014 1644  WBC 12.5*  NEUTROABS 10.3*  HGB 10.3*  HCT 31.8*  MCV 89.1  PLT 269   Cardiac Enzymes:  Recent Labs Lab 07-09-2014 1644  TROPONINI 0.06*    BNP (last 3  results)  Recent Labs  05/06/14 2355 06/04/14 0325 06/19/2014 1644  BNP 646.0* 531.0* 934.0*    ProBNP (last 3 results)  Recent Labs  07/30/13 1523  02/25/14 2200  PROBNP 604.2* 1014.0*    CBG: No results for input(s): GLUCAP in the last 168 hours.  Radiological Exams on Admission: Dg Chest 2 View  07/02/2014   CLINICAL DATA:  Chest pain today, shortness of breath for 2 days. Left hip surgery 1 week ago.  EXAM: CHEST  2 VIEW  COMPARISON:  06/23/2014  FINDINGS: Prior CABG. Heart is mildly enlarged. Diffuse chronic interstitial opacities throughout the lungs. Increasing opacities throughout the right lung. Trace bilateral effusions noted. No acute bony abnormality.  IMPRESSION: Cardiomegaly.  Severe underlying chronic interstitial lung disease.  New increasing opacity throughout the right lower lobe and right upper lobe. Cannot exclude superimposed pneumonia.  Trace bilateral effusions.   Electronically Signed   By: Charlett NoseKevin  Dover M.D.   On: 06/07/2014 17:41   Ct Angio Chest Pe W/cm &/or Wo Cm  06/15/2014   CLINICAL DATA:  Chest pain, shortness of Breath  EXAM: CT ANGIOGRAPHY CHEST WITH CONTRAST  TECHNIQUE: Multidetector CT imaging of the chest was performed using the standard protocol during bolus administration of intravenous contrast. Multiplanar CT image reconstructions and MIPs were obtained to evaluate the vascular anatomy.  CONTRAST:  100mL OMNIPAQUE IOHEXOL 350 MG/ML SOLN  COMPARISON:  05/08/2014  FINDINGS: Sagittal images of the spine shows degenerative changes mid and lower thoracic spine.  Central airways are patent. Atherosclerotic calcifications of thoracic aorta. Again noted status post median sternotomy. Coronary arteries calcifications.  Borderline cardiomegaly. The visualized upper abdomen is unremarkable. Bilateral small pleural effusion. There is bilateral interstitial thickening and peripheral thickening of interlobular septa. There is some fluid along the right major fissure. Patchy central ground-glass parenchymal attenuation bilateral upper and lower lobes. Findings are highly suspicious for moderate pulmonary edema superimposed  on chronic interstitial lung disease. Less likely bilateral diffuse pulmonary infiltrates. Clinical correlation is necessary. There is sparing of the right middle lobe. There is bilateral mild perihilar peribronchial thickening suspicious for mild superimposed bronchitic changes.  A right precarinal lymph node measures 1.5 by 1 cm. Pretracheal lymph node measures 1 by 1 cm. No significant hilar adenopathy.  Review of the MIP images confirms the above findings.  IMPRESSION: 1. No pulmonary embolus. 2. There is bilateral interstitial prominence especially in upper lobes and lower lobes with hazy bilateral parenchymal opacification highly suspicious for pulmonary edema. Findings most likely superimposed on chronic interstitial lung disease. Bilateral small pleural effusion. Small amount of fluid along the right major fissure. 3. Bilateral mild perihilar peribronchial thickening suspicious for superimposed bronchitic changes. 4. Mild mediastinal adenopathy.  No significant hilar adenopathy. 5.   Electronically Signed   By: Natasha MeadLiviu  Pop M.D.   On: 07/04/2014 18:59    Assessment/Plan Principal Problem:   Acute on chronic systolic heart failure Active Problems:   Hypertension   COPD, severe  O2 dependent   Chest pain   Diabetes mellitus type 2, uncontrolled   Femoral neck fracture   Depression with anxiety  Acute on chronic systolic heart failure: BNP 934. Positive troponin 0.06, EKG without acute signs of acute corner syndrome. Dr. Anne FuSkains of cardiology consulted by ED and aware of pt. limited troponin likely secondary to demand from heart failure. CHF likely from ischemic cardio myopathy. Patient with history of CAD. Lasix 80 daily and twice a day not working for patient. Torsemide listed patient's home medications  but patient states he does not take this. - Telemetry - Strict I's and O's - Daily weights - Lasix IV 80 twice a day - Magnesium level  - Cycle troponins - O2 when necessary - Continue  digoxin - Continue home potassium - Continue beta blocker - Continue home aspirin and Plavix - Consider starting ACEi  L Hip fracture: pt w/ appt for staple removal on Thu. Wound intact - remove staples on Thu if still inpt - PT/OT  Leukocytosis WBC 12.5. Patient afebrile vital signs stable. Likely secondary to recent hip surgery and increased stress due to shortness of breath, hypoxia over the last couple of days. - CBC in a.m.  Diabetes: Poorly controlled. A1c 10.8 as of March 2016. Home dose Humulin N 110 units twice a day - SSI  Hypertension:  -Continue carvedilol, Imdur   COPD: No acute exacerbation - Continue Pulmicort - Albuterol when necessary  Depression/Anxiety: - Continue home Zoloft and Xanax  Hyperlipidemia: - Continue statin  Code Status: FULL DVT Prophylaxis: Hep Family Communication: Sons Disposition Plan: pending improvement  Ryleigh Esqueda J, MD Family Medicine [facility] [email or web address] Password [email or web address]

## 2014-07-05 NOTE — ED Notes (Signed)
Pt rates his chest pain 1/10 and describes the pain as a tightness

## 2014-07-05 NOTE — Progress Notes (Signed)
ANTICOAGULATION CONSULT NOTE - Initial Consult  Pharmacy Consult for Heparin Indication: ACS / Stemi   Allergies  Allergen Reactions  . Baycol [Cerivastatin] Other (See Comments)    Patient states it "about killed me" kidney shut down. Black stools.  . Stadol [Butorphanol Tartrate] Other (See Comments)    "makes him crazy"    Patient Measurements: Height: 6\' 3"  (190.5 cm) Weight: 260 lb (117.935 kg) IBW/kg (Calculated) : 84.5 Heparin Dosing Weight: 109 kg  Vital Signs: Temp: 98.6 F (37 C) (03/29 1610) Temp Source: Oral (03/29 1610) BP: 129/81 mmHg (03/29 1800) Pulse Rate: 116 (03/29 1800)  Labs:  Recent Labs  07/07/2014 1644  HGB 10.3*  HCT 31.8*  PLT 269  CREATININE 1.04  TROPONINI 0.06*    Estimated Creatinine Clearance: 98.1 mL/min (by C-G formula based on Cr of 1.04).   Medical History: Past Medical History  Diagnosis Date  . Uncontrolled diabetes mellitus     a. A1C 12.8 in 01/2013.  [name]. Chronic systolic heart failure   . Obesity   . Hyperlipidemia   . Peripheral vascular disease   . Hypertension   . Asthma   . CAD (coronary artery disease)     a. Prior CABG 20 yrs ago (?~1994). b. Hx of multiple stents, [place]. c. [place] 01[place]: cath moderate disease in LAD, distal LCx, prox RCA occluded, 2 SVGs occluded, felt to be stable from prior cath -> for medical therapy.  . Ischemic cardiomyopathy     a. EF 2012: 55-60. b. EF 12/2011: 25-30%. c. Echo 01/2013: EF 15-20%.  . Stroke     15 years ago  . Arthritis   . Anxiety   . Depression   . 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 Chronic respiratory failure     a. Due to COPD.  . Osteomyelitis 2013    was planned for chronic suppressive antibiotics but cannot afford this any longer  . DNR no code (do not resuscitate) 07/2013  . CHF (congestive heart failure)   . Diabetes mellitus without complication     Medications:   Assessment: ED  patient, chest pain Labs reviewed PTA medications reviewed Obese   Goal of Therapy:  Unfractionated heparin level 0.3 - 0.7 Monitor platelets by anticoagulation protocol   Plan:  Heparin bolus 4000  Units, then heparin infusion 1500 units per hour Unfractionated heparin level in 6 hours and daily Monitor CBC , platelets Labs per protocol   Raquel JamesPittman, Jhalen Eley Bennett 06/28/2014,7:42 PM

## 2014-07-05 NOTE — ED Notes (Signed)
Having chest pain at lunch time.  Was having tightness in chest.  EMS gave one nitro SL.  Was given 2 more nitro sl in route to ER..Given ASA 324 mg via [facility] in route to ER.  Pt c/o left hip pain (left Hip surgery one week ago), given fentanyl 25 mcg via EMS.  Currently rates left hip pain 5/10 and chest pain 2/10 with some tightness and stiff neck.

## 2014-07-05 NOTE — ED Provider Notes (Signed)
CSN: [id number]     Arrival date & time 06/20/2014  1557 History  This chart was scribed for Benjiman Core, MD by Evon Slack, ED Scribe. This patient was seen in room APA05/APA05 and the patient's care was started at 4:16 PM.     Chief Complaint  Patient presents with  . Chest Pain  . Shortness of Breath   Patient is a 64 y.o. male presenting with chest pain and shortness of breath. The history is provided by the patient. No language interpreter was used.  Chest Pain Associated symptoms: shortness of breath   Associated symptoms: no fever   Shortness of Breath Associated symptoms: chest pain   Associated symptoms: no fever     HPI Comments: Timothy Trujillo is a 64 y.o. male brought in by ambulance, who presents to the Emergency Department complaining of CP onset today. Pt describes the chest pain as tightness. Pt reports SOB as well for the past 2 days. Pt states that he feels as if he has accumulated lot of fluid on his chest. Pt was given nitroglycerin and aspirin in route by EMS which has provided some relief. Pt states that deep breathing his chest hurts but he relates this to a previous fall. Pt reports left hip pain. Pt reports recently having hip surgery 1 week ago. Pt doesn't report any other symptoms.      Past Medical History  Diagnosis Date  . Uncontrolled diabetes mellitus     a. A1C 12.8 in 01/2013.  [name] Chronic systolic heart failure   . Obesity   . Hyperlipidemia   . Peripheral vascular disease   . Hypertension   . Asthma   . CAD (coronary artery disease)     a. Prior CABG 20 yrs ago (?~1994). b. Hx of multiple stents, prev followed in [place]. c. [place] 01/2013: cath moderate disease in LAD, distal LCx, prox RCA occluded, 2 SVGs occluded, felt to be stable from prior cath -> for medical therapy.  . Ischemic cardiomyopathy     a. EF 2012: 55-60. b. EF 12/2011: 25-30%. c. Echo 01/2013: EF 15-20%.  . Stroke     15 years ago  . Arthritis   . Anxiety   . Depression    . 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 Chronic respiratory failure     a. Due to COPD.  . Osteomyelitis 2013    was planned for chronic suppressive antibiotics but cannot afford this any longer  . CHF (congestive heart failure)   . Diabetes mellitus without complication    Past Surgical History  Procedure Laterality Date  . Coronary artery bypass graft    . Coronary stent placement  2008    CABG grafts closed  . Joint replacement    . Cholecystectomy    . Eye surgery    . I&d extremity  06/12/2011    Procedure: IRRIGATION AND DEBRIDEMENT EXTREMITY;  Surgeon: Javier Docker, MD;  Location: [facility] OR;  Service: Orthopedics;  Laterality: Right;  I&D Right [place]  . Left and right heart catheterization with coronary/graft angiogram N/A 01/15/2013    Procedure: LEFT AND RIGHT HEART CATHETERIZATION WITH Isabel Caprice;  Surgeon: Alvia Grove, MD;  Location: [facility] CATH LAB;  Service: Cardiovascular;  Laterality: N/A;  . Hip pinning,cannulated Left 06/25/2014    Procedure: CANNULATED HIP PINNING;  Surgeon: Tarry Kos, MD;  Location: [facility] OR;  Service: Orthopedics;  Laterality: Left;   Family History  Problem Relation Age of Onset  . Dementia Mother   . Arthritis Mother   . Heart disease Father   . Heart disease Brother    History  Substance Use Topics  . Smoking status: Former Smoker -- 3.00 packs/day for 26 years    Start date: 04/08/1957    Quit date: 04/09/1983  . Smokeless tobacco: Never Used  . Alcohol Use: No    Review of Systems  Constitutional: Negative for fever.  Respiratory: Positive for shortness of breath.   Cardiovascular: Positive for chest pain.  Musculoskeletal: Positive for arthralgias.  All other systems reviewed and are negative.    Allergies  Baycol and Stadol  Home Medications   Prior to Admission medications   Medication Sig Start Date End Date Taking? Authorizing Provider  ALPRAZolam  (XANAX) 0.25 MG tablet Take 1 tablet (0.25 mg total) by mouth 2 (two) times daily as needed for anxiety. 05/10/14  Yes Kari Baars, MD  aspirin 325 MG tablet Take 325 mg by mouth daily.   Yes Historical Provider, MD  atorvastatin (LIPITOR) 80 MG tablet Take 1 tablet (80 mg total) by mouth daily. 06/09/14  Yes Kari Baars, MD  budesonide (PULMICORT) 0.25 MG/2ML nebulizer solution Take 2 mLs (0.25 mg total) by nebulization 2 (two) times daily. 06/09/14  Yes Kari Baars, MD  carvedilol (COREG) 6.25 MG tablet Take 1 tablet (6.25 mg total) by mouth 2 (two) times daily. 04/25/14  Yes Jodelle Gross, NP  clopidogrel (PLAVIX) 75 MG tablet Take 1 tablet (75 mg total) by mouth daily. 04/25/14  Yes Jodelle Gross, NP  digoxin (LANOXIN) 0.25 MG tablet Take 1 tablet (0.25 mg total) by mouth daily. 06/09/14  Yes Kari Baars, MD  insulin NPH Human (HUMULIN N,NOVOLIN N) 100 UNIT/ML injection Inject 0.4 mLs (40 Units total) into the skin daily before breakfast. Patient taking differently: Inject 110 Units into the skin 2 (two) times daily.  06/09/14  Yes Kari Baars, MD  isosorbide mononitrate (IMDUR) 60 MG 24 hr tablet Take 1 tablet (60 mg total) by mouth daily. 04/25/14  Yes Jodelle Gross, NP  nitroGLYCERIN (NITROSTAT) 0.4 MG SL tablet Place 1 tablet (0.4 mg total) under the tongue every 5 (five) minutes as needed for chest pain. 04/25/14  Yes Jodelle Gross, NP  Nutritional Supplements (ENSURE LIGHT PO) Take 1 Can by mouth 2 (two) times daily.   Yes Historical Provider, MD  ondansetron (ZOFRAN) 4 MG tablet Take 1 tablet (4 mg total) by mouth every 6 (six) hours as needed for nausea. 06/09/14  Yes Kari Baars, MD  oxyCODONE-acetaminophen (PERCOCET) 10-325 MG per tablet Take 0.5-1 tablets by mouth every 4 (four) hours as needed for pain.    Yes Historical Provider, MD  polyethylene glycol (MIRALAX / GLYCOLAX) packet Take 17 g by mouth daily. 06/09/14  Yes Kari Baars, MD  potassium chloride SA  (K-DUR,KLOR-CON) 20 MEQ tablet Take 1 tablet (20 mEq total) by mouth 2 (two) times daily. 06/09/14  Yes Kari Baars, MD  sertraline (ZOLOFT) 50 MG tablet Take 1 tablet (50 mg total) by mouth daily. 05/10/14  Yes Kari Baars, MD  torsemide (DEMADEX) 20 MG tablet Take 1 tablet (20 mg total) by mouth 2 (two) times daily. 06/09/14  Yes Kari Baars, MD  vitamin B-12 (CYANOCOBALAMIN) 1000 MCG tablet Take 1,000 mcg by mouth daily.   Yes Historical Provider, MD  vitamin C (ASCORBIC ACID) 500 MG tablet Take 1,000 mg by mouth  at bedtime.   Yes Historical Provider, MD  vitamin E 400 UNIT capsule Take 400 Units by mouth at bedtime.    Yes Historical Provider, MD  acetaminophen (TYLENOL) 325 MG tablet Take 2 tablets (650 mg total) by mouth every 6 (six) hours as needed for mild pain (or Fever >/= 101). 06/09/14   Kari BaarsEdward Hawkins, MD  diphenhydrAMINE (BENADRYL) 50 MG tablet Take 1 tablet (50 mg total) by mouth every 6 (six) hours as needed for itching. 05/10/14   Kari BaarsEdward Hawkins, MD  feeding supplement, ENSURE COMPLETE, (ENSURE COMPLETE) LIQD Take 237 mLs by mouth 2 (two) times daily between meals. Patient not taking: Reported on 2014-09-21 06/09/14   Kari BaarsEdward Hawkins, MD  hydrocortisone 2.5 % cream Apply 1 application topically daily as needed (itching).  04/25/14   Historical Provider, MD  levalbuterol Pauline Aus(XOPENEX) 0.63 MG/3ML nebulizer solution Take 3 mLs (0.63 mg total) by nebulization every 6 (six) hours as needed for wheezing or shortness of breath. 06/09/14   Kari BaarsEdward Hawkins, MD   BP 113/72 mmHg  Pulse 64  Temp(Src) 98.7 F (37.1 C) (Oral)  Resp 22  Ht 6\' 3"  (1.905 m)  Wt 237 lb (107.502 kg)  BMI 29.62 kg/m2  SpO2 97%   Physical Exam  Constitutional: He is oriented to person, place, and time. He appears well-developed and well-nourished. No distress.  HENT:  Head: Normocephalic and atraumatic.  Eyes: Conjunctivae and EOM are normal.  Neck: Neck supple. JVD present. No tracheal deviation present.  Mild left  sided JVD.   Cardiovascular: Tachycardia present.   Pulmonary/Chest: Effort normal. No respiratory distress. He has rhonchi.  mildy dyspneic but talking in full sentences, Rales heard in both fields.    Musculoskeletal: Normal range of motion. He exhibits edema and tenderness.  Tender of left hip. Trace to no pitting edema.   Neurological: He is alert and oriented to person, place, and time.  Skin: Skin is warm and dry.  Psychiatric: He has a normal mood and affect. His behavior is normal.  Nursing note and vitals reviewed.   ED Course  Procedures (including critical care time) Labs Review Labs Reviewed  CBC WITH DIFFERENTIAL/PLATELET - Abnormal; Notable for the following:    WBC 12.5 (*)    RBC 3.57 (*)    Hemoglobin 10.3 (*)    HCT 31.8 (*)    Neutrophils Relative % 82 (*)    Neutro Abs 10.3 (*)    Lymphocytes Relative 9 (*)    All other components within normal limits  TROPONIN I - Abnormal; Notable for the following:    Troponin I 0.06 (*)    All other components within normal limits  BRAIN NATRIURETIC PEPTIDE - Abnormal; Notable for the following:    B Natriuretic Peptide 934.0 (*)    All other components within normal limits  BASIC METABOLIC PANEL - Abnormal; Notable for the following:    Chloride 95 (*)    Glucose, Bld 255 (*)    Calcium 8.1 (*)    GFR calc non Af Amer 73 (*)    GFR calc Af Amer 85 (*)    All other components within normal limits  DIGOXIN LEVEL - Abnormal; Notable for the following:    Digoxin Level <0.2 (*)    All other components within normal limits  TROPONIN I - Abnormal; Notable for the following:    Troponin I 0.12 (*)    All other components within normal limits  TROPONIN I - Abnormal; Notable for the following:  Troponin I 0.12 (*)    All other components within normal limits  MAGNESIUM - Abnormal; Notable for the following:    Magnesium 1.1 (*)    All other components within normal limits  GLUCOSE, CAPILLARY - Abnormal; Notable  for the following:    Glucose-Capillary 179 (*)    All other components within normal limits  PROTIME-INR  CBC  COMPREHENSIVE METABOLIC PANEL  TROPONIN I  HEPARIN LEVEL (UNFRACTIONATED)    Imaging Review Dg Chest 2 View  08-04-14   CLINICAL DATA:  Chest pain today, shortness of breath for 2 days. Left hip surgery 1 week ago.  EXAM: CHEST  2 VIEW  COMPARISON:  06/23/2014  FINDINGS: Prior CABG. Heart is mildly enlarged. Diffuse chronic interstitial opacities throughout the lungs. Increasing opacities throughout the right lung. Trace bilateral effusions noted. No acute bony abnormality.  IMPRESSION: Cardiomegaly.  Severe underlying chronic interstitial lung disease.  New increasing opacity throughout the right lower lobe and right upper lobe. Cannot exclude superimposed pneumonia.  Trace bilateral effusions.   Electronically Signed   By: Charlett Nose M.D.   On: 2014/08/04 17:41   Ct Angio Chest Pe W/cm &/or Wo Cm  08/04/2014   CLINICAL DATA:  Chest pain, shortness of Breath  EXAM: CT ANGIOGRAPHY CHEST WITH CONTRAST  TECHNIQUE: Multidetector CT imaging of the chest was performed using the standard protocol during bolus administration of intravenous contrast. Multiplanar CT image reconstructions and MIPs were obtained to evaluate the vascular anatomy.  CONTRAST:  OMNIPAQUE IOHEXOL 350 MG/ML SOLN  COMPARISON:  05/08/2014  FINDINGS: Sagittal images of the spine shows degenerative changes mid and lower thoracic spine.  Central airways are patent. Atherosclerotic calcifications of thoracic aorta. Again noted status post median sternotomy. Coronary arteries calcifications.  Borderline cardiomegaly. The visualized upper abdomen is unremarkable. Bilateral small pleural effusion. There is bilateral interstitial thickening and peripheral thickening of interlobular septa. There is some fluid along the right major fissure. Patchy central ground-glass parenchymal attenuation bilateral upper and lower lobes.  Findings are highly suspicious for moderate pulmonary edema superimposed on chronic interstitial lung disease. Less likely bilateral diffuse pulmonary infiltrates. Clinical correlation is necessary. There is sparing of the right middle lobe. There is bilateral mild perihilar peribronchial thickening suspicious for mild superimposed bronchitic changes.  A right precarinal lymph node measures 1.5 by 1 cm. Pretracheal lymph node measures 1 by 1 cm. No significant hilar adenopathy.  Review of the MIP images confirms the above findings.  IMPRESSION: 1. No pulmonary embolus. 2. There is bilateral interstitial prominence especially in upper lobes and lower lobes with hazy bilateral parenchymal opacification highly suspicious for pulmonary edema. Findings most likely superimposed on chronic interstitial lung disease. Bilateral small pleural effusion. Small amount of fluid along the right major fissure. 3. Bilateral mild perihilar peribronchial thickening suspicious for superimposed bronchitic changes. 4. Mild mediastinal adenopathy.  No significant hilar adenopathy. 5.   Electronically Signed   By: Natasha Mead M.D.   On: 2014/08/04 18:59     EKG Interpretation None     ED ECG REPORT   Date: 07/06/2014  Rate: 118  Rhythm: sinus tachycardia  QRS Axis: right  Intervals: normal  ST/T Wave abnormalities: nonspecific ST/T changes  Conduction Disutrbances:nonspecific intraventricular conduction delay  Narrative Interpretation:   Old EKG Reviewed: changes noted Rate increased      MDM   Final diagnoses:  Acute on chronic systolic congestive heart failure   Patient with chest pain shortness of breath after recent hip surgery.  He appears to be in a CHF. Has persistent tachycardia. BNP is elevated. Troponin is slightly above normal. EKG shows nonspecific changes. CT scan done due to recent surgery and showed CHF. BNP is elevated. Started on heparin and will admit to internal medicine. Discussed with  cardiology at [facility] who thinks the patient can stay up at [facility].  I personally performed the services described in this documentation, which was scribed in my presence. The recorded information has been reviewed and is accurate.  CRITICAL CARE Performed by: Billee Cashing Total critical care time: 30 Critical care time was exclusive of separately billable procedures and treating other patients. Critical care was necessary to treat or prevent imminent or life-threatening deterioration. Critical care was time spent personally by me on the following activities: development of treatment plan with patient and/or surrogate as well as nursing, discussions with consultants, evaluation of patient's response to treatment, examination of patient, obtaining history from patient or surrogate, ordering and performing treatments and interventions, ordering and review of laboratory studies, ordering and review of radiographic studies, pulse oximetry and re-evaluation of patient's condition.      Benjiman Core, MD 07/06/14 [id number]

## 2014-07-05 NOTE — Progress Notes (Signed)
Paged Dr Maisie Fushomas regarding latest resulted Troponin of 0.12. No c/o chest pain at this time. Sinus Tachycardia on the telemetry monitor. VSS at this time. Continuing to monitor. Bed remains in lowest position and calll bell is within reach.

## 2014-07-06 ENCOUNTER — Inpatient Hospital Stay (HOSPITAL_COMMUNITY): Payer: Commercial Managed Care - HMO

## 2014-07-06 DIAGNOSIS — J9601 Acute respiratory failure with hypoxia: Secondary | ICD-10-CM | POA: Diagnosis present

## 2014-07-06 DIAGNOSIS — I5023 Acute on chronic systolic (congestive) heart failure: Secondary | ICD-10-CM | POA: Diagnosis present

## 2014-07-06 LAB — COMPREHENSIVE METABOLIC PANEL
ALBUMIN: 3.2 g/dL — AB (ref 3.5–5.2)
ALT: 9 U/L (ref 0–53)
AST: 13 U/L (ref 0–37)
Alkaline Phosphatase: 99 U/L (ref 39–117)
Anion gap: 12 (ref 5–15)
BUN: 16 mg/dL (ref 6–23)
CHLORIDE: 99 mmol/L (ref 96–112)
CO2: 27 mmol/L (ref 19–32)
CREATININE: 0.96 mg/dL (ref 0.50–1.35)
Calcium: 8.3 mg/dL — ABNORMAL LOW (ref 8.4–10.5)
GFR calc Af Amer: >90 mL/min (ref >90)
GFR calc non Af Amer: 85 mL/min — ABNORMAL LOW (ref >90)
Glucose, Bld: 203 mg/dL — ABNORMAL HIGH (ref 70–99)
Potassium: 3.7 mmol/L (ref 3.5–5.1)
SODIUM: 138 mmol/L (ref 135–145)
Total Bilirubin: 0.6 mg/dL (ref 0.3–1.2)
Total Protein: 6.8 g/dL (ref 6.0–8.3)

## 2014-07-06 LAB — CBC
HEMATOCRIT: 35 % — AB (ref 39.0–52.0)
Hemoglobin: 11.2 g/dL — ABNORMAL LOW (ref 13.0–17.0)
MCH: 28.9 pg (ref 26.0–34.0)
MCHC: 32 g/dL (ref 30.0–36.0)
MCV: 90.2 fL (ref 78.0–100.0)
PLATELETS: 287 10*3/uL (ref 150–400)
RBC: 3.88 MIL/uL — ABNORMAL LOW (ref 4.22–5.81)
RDW: 14.8 % (ref 11.5–15.5)
WBC: 12.8 10*3/uL — AB (ref 4.0–10.5)

## 2014-07-06 LAB — HEPARIN LEVEL (UNFRACTIONATED): Heparin Unfractionated: 0.2 IU/mL — ABNORMAL LOW (ref 0.30–0.70)

## 2014-07-06 LAB — POCT I-STAT 3, ART BLOOD GAS (G3+)
ACID-BASE EXCESS: 5 mmol/L — AB (ref 0.0–2.0)
BICARBONATE: 28.9 meq/L — AB (ref 20.0–24.0)
O2 Saturation: 97 %
PH ART: 7.468 — AB (ref 7.350–7.450)
TCO2: 30 mmol/L (ref 0–100)
pCO2 arterial: 39.8 mmHg (ref 35.0–45.0)
pO2, Arterial: 81 mmHg (ref 80.0–100.0)

## 2014-07-06 LAB — INFLUENZA PANEL BY PCR (TYPE A & B)
H1N1 flu by pcr: NOT DETECTED
Influenza A By PCR: NEGATIVE
Influenza B By PCR: NEGATIVE

## 2014-07-06 LAB — STREP PNEUMONIAE URINARY ANTIGEN: Strep Pneumo Urinary Antigen: NEGATIVE

## 2014-07-06 LAB — GLUCOSE, CAPILLARY
GLUCOSE-CAPILLARY: 206 mg/dL — AB (ref 70–99)
GLUCOSE-CAPILLARY: 288 mg/dL — AB (ref 70–99)
GLUCOSE-CAPILLARY: 338 mg/dL — AB (ref 70–99)
Glucose-Capillary: 184 mg/dL — ABNORMAL HIGH (ref 70–99)

## 2014-07-06 LAB — PROCALCITONIN: PROCALCITONIN: 0.14 ng/mL

## 2014-07-06 LAB — TROPONIN I
Troponin I: 0.12 ng/mL — ABNORMAL HIGH (ref <0.031)
Troponin I: 0.13 ng/mL — ABNORMAL HIGH (ref <0.031)
Troponin I: 0.15 ng/mL — ABNORMAL HIGH (ref <0.031)
Troponin I: 0.15 ng/mL — ABNORMAL HIGH (ref <0.031)
Troponin I: 0.17 ng/mL — ABNORMAL HIGH (ref <0.031)

## 2014-07-06 LAB — LACTIC ACID, PLASMA: LACTIC ACID, VENOUS: 1.8 mmol/L (ref 0.5–2.0)

## 2014-07-06 LAB — MRSA PCR SCREENING: MRSA by PCR: NEGATIVE

## 2014-07-06 MED ORDER — ONDANSETRON HCL 4 MG PO TABS
4.0000 mg | ORAL_TABLET | Freq: Four times a day (QID) | ORAL | Status: DC | PRN
Start: 2014-07-06 — End: 2014-07-11
  Filled 2014-07-06: qty 1

## 2014-07-06 MED ORDER — PIPERACILLIN-TAZOBACTAM 3.375 G IVPB
3.3750 g | Freq: Three times a day (TID) | INTRAVENOUS | Status: DC
  Administered 2014-07-07 – 2014-07-08 (×4): 3.375 g via INTRAVENOUS
  Filled 2014-07-06 (×6): qty 50

## 2014-07-06 MED ORDER — OSELTAMIVIR PHOSPHATE 75 MG PO CAPS
75.0000 mg | ORAL_CAPSULE | Freq: Two times a day (BID) | ORAL | Status: DC
  Administered 2014-07-06: 75 mg via ORAL
  Filled 2014-07-06 (×2): qty 1

## 2014-07-06 MED ORDER — SODIUM CHLORIDE 0.9 % IV SOLN
INTRAVENOUS | Status: AC
  Administered 2014-07-06: 17:00:00 via INTRAVENOUS

## 2014-07-06 MED ORDER — VITAMIN B-12 1000 MCG PO TABS
1000.0000 ug | ORAL_TABLET | Freq: Every day | ORAL | Status: DC
  Administered 2014-07-06 – 2014-07-12 (×7): 1000 ug via ORAL
  Filled 2014-07-06 (×7): qty 1

## 2014-07-06 MED ORDER — DEXTROSE 5 % IV SOLN
500.0000 mg | INTRAVENOUS | Status: DC
  Administered 2014-07-06 – 2014-07-07 (×2): 500 mg via INTRAVENOUS
  Filled 2014-07-06 (×3): qty 500

## 2014-07-06 MED ORDER — LEVALBUTEROL HCL 0.63 MG/3ML IN NEBU
0.6300 mg | INHALATION_SOLUTION | Freq: Four times a day (QID) | RESPIRATORY_TRACT | Status: DC
  Administered 2014-07-06 – 2014-07-08 (×8): 0.63 mg via RESPIRATORY_TRACT
  Filled 2014-07-06 (×17): qty 3

## 2014-07-06 MED ORDER — DEXTROSE 5 % IV SOLN
1.0000 g | Freq: Three times a day (TID) | INTRAVENOUS | Status: DC
  Administered 2014-07-06 (×2): 1 g via INTRAVENOUS
  Filled 2014-07-06 (×3): qty 1

## 2014-07-06 MED ORDER — HYDROCORTISONE NA SUCCINATE PF 100 MG IJ SOLR
50.0000 mg | Freq: Four times a day (QID) | INTRAMUSCULAR | Status: DC
  Administered 2014-07-06 – 2014-07-08 (×7): 50 mg via INTRAVENOUS
  Filled 2014-07-06 (×13): qty 1

## 2014-07-06 MED ORDER — POLYETHYLENE GLYCOL 3350 17 G PO PACK
17.0000 g | PACK | Freq: Every day | ORAL | Status: DC
  Administered 2014-07-06 – 2014-07-12 (×4): 17 g via ORAL
  Filled 2014-07-06 (×7): qty 1

## 2014-07-06 MED ORDER — VANCOMYCIN HCL IN DEXTROSE 1-5 GM/200ML-% IV SOLN
1000.0000 mg | Freq: Two times a day (BID) | INTRAVENOUS | Status: DC
  Administered 2014-07-06 – 2014-07-08 (×4): 1000 mg via INTRAVENOUS
  Filled 2014-07-06 (×6): qty 200

## 2014-07-06 MED ORDER — GLUCERNA SHAKE PO LIQD
237.0000 mL | Freq: Three times a day (TID) | ORAL | Status: DC
  Administered 2014-07-06 – 2014-07-09 (×7): 237 mL via ORAL

## 2014-07-06 MED ORDER — HEPARIN SODIUM (PORCINE) 5000 UNIT/ML IJ SOLN
5000.0000 [IU] | Freq: Three times a day (TID) | INTRAMUSCULAR | Status: DC
  Administered 2014-07-06 – 2014-07-12 (×18): 5000 [IU] via SUBCUTANEOUS
  Filled 2014-07-06 (×20): qty 1

## 2014-07-06 MED ORDER — VANCOMYCIN HCL 10 G IV SOLR
2000.0000 mg | Freq: Once | INTRAVENOUS | Status: AC
  Administered 2014-07-06: 2000 mg via INTRAVENOUS
  Filled 2014-07-06 (×2): qty 2000

## 2014-07-06 MED ORDER — METHYLPREDNISOLONE SODIUM SUCC 125 MG IJ SOLR
60.0000 mg | Freq: Two times a day (BID) | INTRAMUSCULAR | Status: DC
  Administered 2014-07-06: 60 mg via INTRAVENOUS
  Filled 2014-07-06: qty 2

## 2014-07-06 MED ORDER — HYDROXYZINE HCL 25 MG PO TABS
25.0000 mg | ORAL_TABLET | Freq: Three times a day (TID) | ORAL | Status: DC | PRN
  Administered 2014-07-06: 25 mg via ORAL
  Filled 2014-07-06: qty 1

## 2014-07-06 MED ORDER — INSULIN DETEMIR 100 UNIT/ML ~~LOC~~ SOLN
20.0000 [IU] | Freq: Two times a day (BID) | SUBCUTANEOUS | Status: DC
  Administered 2014-07-06 – 2014-07-12 (×13): 20 [IU] via SUBCUTANEOUS
  Filled 2014-07-06 (×15): qty 0.2

## 2014-07-06 MED ORDER — PIPERACILLIN-TAZOBACTAM 3.375 G IVPB 30 MIN
3.3750 g | Freq: Once | INTRAVENOUS | Status: AC
  Administered 2014-07-06: 3.375 g via INTRAVENOUS
  Filled 2014-07-06: qty 50

## 2014-07-06 MED ORDER — SODIUM CHLORIDE 0.9 % IV SOLN: INTRAVENOUS | Status: DC

## 2014-07-06 MED ORDER — SODIUM CHLORIDE 0.9 % IV BOLUS (SEPSIS)
500.0000 mL | Freq: Once | INTRAVENOUS | Status: AC
  Administered 2014-07-06: 500 mL via INTRAVENOUS

## 2014-07-06 MED ORDER — NITROGLYCERIN 2 % TD OINT
0.5000 [in_us] | TOPICAL_OINTMENT | TRANSDERMAL | Status: AC
  Administered 2014-07-06: 0.5 [in_us] via TOPICAL
  Filled 2014-07-06: qty 1

## 2014-07-06 MED ORDER — IPRATROPIUM BROMIDE 0.02 % IN SOLN
0.5000 mg | Freq: Four times a day (QID) | RESPIRATORY_TRACT | Status: DC
  Administered 2014-07-06 – 2014-07-08 (×8): 0.5 mg via RESPIRATORY_TRACT
  Filled 2014-07-06 (×9): qty 2.5

## 2014-07-06 MED ORDER — CETYLPYRIDINIUM CHLORIDE 0.05 % MT LIQD
7.0000 mL | Freq: Two times a day (BID) | OROMUCOSAL | Status: DC
  Administered 2014-07-06 – 2014-07-08 (×5): 7 mL via OROMUCOSAL

## 2014-07-06 NOTE — Progress Notes (Signed)
ANTICOAGULATION CONSULT NOTE - Follow Up Consult  Pharmacy Consult for Heparin  Indication: chest pain/ACS  Allergies  Allergen Reactions  . Baycol [Cerivastatin] Other (See Comments)    Patient states it "about killed me" kidney shut down. Black stools.  . Stadol [Butorphanol Tartrate] Other (See Comments)    "makes him crazy"    Patient Measurements: Height: 6\' 3"  (190.5 cm) Weight: 232 lb 12.9 oz (105.6 kg) IBW/kg (Calculated) : 84.5   Vital Signs: Temp: 98.3 F (36.8 C) (03/30 0135) Temp Source: Oral (03/30 0135) BP: 101/60 mmHg (03/30 0135) Pulse Rate: 102 (03/30 0135)  Labs:  Recent Labs  07/06/2014 1644  06/26/2014 2331 07/06/14 0215 07/06/14 0436  HGB 10.3*  --   --   --  11.2*  HCT 31.8*  --   --   --  35.0*  PLT 269  --   --   --  287  LABPROT 13.9  --   --   --   --   INR 1.06  --   --   --   --   HEPARINUNFRC  --   --   --   --  0.20*  CREATININE 1.04  --   --   --  0.96  TROPONINI 0.06*  < > 0.12* 0.15* 0.17*  < > = values in this interval not displayed.  Estimated Creatinine Clearance: 100.8 mL/min (by C-G formula based on Cr of 0.96).  Assessment: Tx from [facility], sub-therapeutic heparin level x 1, no issues per RN.  Goal of Therapy:  Heparin level 0.3-0.7 units/ml Monitor platelets by anticoagulation protocol: Yes   Plan:  -Increase heparin to 1800 units/hr -1400 HL -Daily CBC/HL -Monitor for bleeding  Abran DukeLedford, Vineet 07/06/2014,6:12 AM

## 2014-07-06 NOTE — Progress Notes (Addendum)
[facility] PROGRESS NOTE Interim History: 64 y.o. male with ischemic cardiomyopathy with an EF of 20-25%, status post CABG, severe COPD oxygen dependent diabetes mellitus type 2 comes in with chest pain Chest pain. Started around 12:00 Described as chest tightness. Associated with shortness of breath, lightheaded, chilled and w/ radiation to the neck. SOB for the previous 2 days. Home O2 as low as 71% and very irregular heart beat noted at home. Admit to SDU> on IV heparin and nitropaste.  Filed Weights   06/30/2014 1610 06/20/2014 2116 07/06/14 0539  Weight: 117.935 kg (260 lb) 107.502 kg (237 lb) 105.6 kg (232 lb 12.9 oz)        Intake/Output Summary (Last 24 hours) at 07/06/14 0725 Last data filed at 07/06/14 0700  Gross per 24 hour  Intake    405 ml  Output   1100 ml  Net   -695 ml     Assessment/Plan: Acute on chronic c respiratory failure with hypoxia: - Unclear etiology of his shortness of breath and chest pain, he relates no orthopnea, some shortness of breath with exertion. He relates that his pain is with moving around the bed. He seems to be euvolemic on physical exam. - Chest x-ray shows asymmetrical interstitial infiltrates, CT injected chest showed no PE with asymmetrical infiltrates in upper and lower lobe specially on the right. BNP at baseline. - EKG shows possible MAT. - He relates him cough at home has a mild leukocytosis has remained afebrile here in the hospital. He has been diuresed is negative about a liter, on admission we was close to his estimated dry weight of 105 kg. His blood pressure is low I will hold the IV Lasix DC and the Nitropaste. - It is unlikely that this is acute decompensated heart failure, I am more concerned of an infectious process like aspiration pneumonia, versus healthcare associated pneumonia with superimposed COPD exacerbation.  - I will hold his IV Lasix, start him on empirically vancomycin and Zosyn and Tamiflu, check an influenza  PCR. Check blood cultures and sputum cultures. - He is on chronic steroids at home and start him IV Solu-Medrol.   Elevated troponins: - Cardiology has been consulted he is currently on IV heparin An Nitropaste. - He is chest pain-free. Further management per cardiology. - Continue Coreg.  Depression with anxiety: - Continue Zoloft and Xanax.  History of Femoral neck fracture: - PT OT consult in the morning.  Diabetes mellitus type 2, uncontrolled: - Poorly controlled we'll start him on Levemir twice a day continue sliding scale insulin.  Essential  Hypertension: - His blood pressure significantly low, he is on Coreg iMDUR and IV furosemide. - He is close to his dry weight will hold IV Lasix.    Code Status: full Family Communication: none  Disposition Plan: inpatient   Consultants:  cardiology  Procedures: ECHO: none  Antibiotics:  vanc and cefepime 3.30.2016  HPI/Subjective: CP free, cont to be SOB  Objective: Filed Vitals:   07/06/14 0600 07/06/14 0615 07/06/14 0630 07/06/14 0645  BP: 102/66 110/55 84/60 93/69   Pulse:      Temp:      TempSrc:      Resp: 22 16 24 13   Height:      Weight:      SpO2: 99%  99% 100%     Exam:  General: Alert, awake, oriented x3, in no acute distress.  HEENT: No bruits, no goiter. -JVD Heart: Regular rate and rhythm, no edema. Lungs: Good  air movement, crackles on his right upper and upper lung.  Abdomen: Soft, nontender, nondistended, positive bowel sounds.    Data Reviewed: Basic Metabolic Panel:  Recent Labs Lab 02-Aug-2014 1644 02-Aug-2014 2050 07/06/14 0436  NA 137  --  138  K 4.4  --  3.7  CL 95*  --  99  CO2 31  --  27  GLUCOSE 255*  --  203*  BUN 18  --  16  CREATININE 1.04  --  0.96  CALCIUM 8.1*  --  8.3*  MG  --  1.1*  --    Liver Function Tests:  Recent Labs Lab 07/06/14 0436  AST 13  ALT 9  ALKPHOS 99  BILITOT 0.6  PROT 6.8  ALBUMIN 3.2*   No results for input(s): LIPASE, AMYLASE in the  last 168 hours. No results for input(s): AMMONIA in the last 168 hours. CBC:  Recent Labs Lab 08-02-14 1644 07/06/14 0436  WBC 12.5* 12.8*  NEUTROABS 10.3*  --   HGB 10.3* 11.2*  HCT 31.8* 35.0*  MCV 89.1 90.2  PLT 269 287   Cardiac Enzymes:  Recent Labs Lab 08/02/14 1644 2014/08/02 2050 02-Aug-2014 2331 07/06/14 0215 07/06/14 0436  TROPONINI 0.06* 0.12* 0.12* 0.15* 0.17*   BNP (last 3 results)  Recent Labs  05/06/14 2355 06/04/14 0325 08-02-2014 1644  BNP 646.0* 531.0* 934.0*    ProBNP (last 3 results)  Recent Labs  07/30/13 1523 02/25/14 2200  PROBNP 604.2* 1014.0*    CBG:  Recent Labs Lab 08-02-14 2147  GLUCAP 179*    No results found for this or any previous visit (from the past 240 hour(s)).   Studies: Dg Chest 2 View  August 02, 2014   CLINICAL DATA:  Chest pain today, shortness of breath for 2 days. Left hip surgery 1 week ago.  EXAM: CHEST  2 VIEW  COMPARISON:  06/23/2014  FINDINGS: Prior CABG. Heart is mildly enlarged. Diffuse chronic interstitial opacities throughout the lungs. Increasing opacities throughout the right lung. Trace bilateral effusions noted. No acute bony abnormality.  IMPRESSION: Cardiomegaly.  Severe underlying chronic interstitial lung disease.  New increasing opacity throughout the right lower lobe and right upper lobe. Cannot exclude superimposed pneumonia.  Trace bilateral effusions.   Electronically Signed   By: Charlett Nose M.D.   On: 08-02-2014 17:41   Ct Angio Chest Pe W/cm &/or Wo Cm  2014-08-02   CLINICAL DATA:  Chest pain, shortness of Breath  EXAM: CT ANGIOGRAPHY CHEST WITH CONTRAST  TECHNIQUE: Multidetector CT imaging of the chest was performed using the standard protocol during bolus administration of intravenous contrast. Multiplanar CT image reconstructions and MIPs were obtained to evaluate the vascular anatomy.  CONTRAST:  OMNIPAQUE IOHEXOL 350 MG/ML SOLN  COMPARISON:  05/08/2014  FINDINGS: Sagittal images of the  spine shows degenerative changes mid and lower thoracic spine.  Central airways are patent. Atherosclerotic calcifications of thoracic aorta. Again noted status post median sternotomy. Coronary arteries calcifications.  Borderline cardiomegaly. The visualized upper abdomen is unremarkable. Bilateral small pleural effusion. There is bilateral interstitial thickening and peripheral thickening of interlobular septa. There is some fluid along the right major fissure. Patchy central ground-glass parenchymal attenuation bilateral upper and lower lobes. Findings are highly suspicious for moderate pulmonary edema superimposed on chronic interstitial lung disease. Less likely bilateral diffuse pulmonary infiltrates. Clinical correlation is necessary. There is sparing of the right middle lobe. There is bilateral mild perihilar peribronchial thickening suspicious for mild superimposed bronchitic changes.  A right  precarinal lymph node measures 1.5 by 1 cm. Pretracheal lymph node measures 1 by 1 cm. No significant hilar adenopathy.  Review of the MIP images confirms the above findings.  IMPRESSION: 1. No pulmonary embolus. 2. There is bilateral interstitial prominence especially in upper lobes and lower lobes with hazy bilateral parenchymal opacification highly suspicious for pulmonary edema. Findings most likely superimposed on chronic interstitial lung disease. Bilateral small pleural effusion. Small amount of fluid along the right major fissure. 3. Bilateral mild perihilar peribronchial thickening suspicious for superimposed bronchitic changes. 4. Mild mediastinal adenopathy.  No significant hilar adenopathy. 5.   Electronically Signed   By: Natasha Mead M.D.   On: 08/04/2014 18:59    Scheduled Meds: . antiseptic oral rinse  7 mL Mouth Rinse BID  . aspirin  325 mg Oral Daily  . atorvastatin  80 mg Oral q1800  . budesonide  0.25 mg Nebulization BID  . carvedilol  6.25 mg Oral BID WC  . clopidogrel  75 mg Oral Daily  .  digoxin  0.25 mg Oral Daily  . feeding supplement (ENSURE ENLIVE)  237 mL Oral BID BM  . furosemide  80 mg Intravenous BID  . insulin aspart  0-20 Units Subcutaneous TID WC  . insulin aspart  0-5 Units Subcutaneous QHS  . isosorbide mononitrate  60 mg Oral Daily  . potassium chloride SA  20 mEq Oral BID  . sertraline  50 mg Oral Daily  . sodium chloride  3 mL Intravenous Q12H   Continuous Infusions: . heparin 1,800 Units/hr (07/06/14 0620)     Marinda Elk  [facility] Pager [phone]. If 7PM-7AM, please contact night-coverage at [email or web address], password [facility] 07/06/2014, 7:25 AM  LOS: 1 day

## 2014-07-06 NOTE — Progress Notes (Signed)
STAT EKG obtained earlier. Notified Dr Alvester MorinNewton of results. Pt c/o generalized weakness and sob. Nurse practiced deep breathing exercises that helped with sob. O2 saturation remain in high 90's.  VS obtained and stable. Dr Alvester MorinNewton [facility]. Notified Son, Onalee HuaDavid of change in condition and of transfer.    [id number] [facility] by [facility]. VS remained stable. Pt A&O.

## 2014-07-06 NOTE — Progress Notes (Signed)
OT Cancellation Note  Patient Details Name: Timothy Trujillo MRN: [id number] DOB: September 24, 1950   Cancelled Treatment:    Reason Eval/Treat Not Completed: Medical issues which prohibited therapy (troponins trending up) Will continue to follow.  Evern BioMayberry, Jonaven Hilgers Lynn 07/06/2014, 3:13 PM

## 2014-07-06 NOTE — Progress Notes (Signed)
[facility] Dr. Okey DupreEnd notified, cardiology to consult during rounds this morning. [facility] notiified. No new orders at this time Vital signs stable., will continue to montior.

## 2014-07-06 NOTE — Progress Notes (Signed)
INITIAL NUTRITION ASSESSMENT  DOCUMENTATION CODES Per approved criteria  -Not Applicable   INTERVENTION: -RD will follow for diet advancement -Once diet is advanced, add Glucerna Shake po TID, each supplement provides 220 kcal and 10 grams of protein -D/c Ensure Enlive po BID, each supplement provides 350 kcal and 20 grams of protein in attempt to optimize glycemic control  NUTRITION DIAGNOSIS: Inadequate oral intake related to inability to eat as evidenced by NPO.   Goal: Pt will meet >90% of estimated nutritional needs  Monitor:  Diet advancement, PO/supplement intake, labs, weight changes, I/O's  Reason for Assessment: MST=5  64 y.o. male  Admitting Dx: <principal problem not specified>  64 y.o. male with ischemic cardiomyopathy with an EF of 20-25%, status post CABG, severe COPD oxygen dependent diabetes mellitus type 2 comes in with chest pain Chest pain. Started around 12:00 Described as chest tightness. Associated with shortness of breath, lightheaded, chilled and w/ radiation to the neck. SOB for the previous 2 days. Home O2 as low as 71% and very irregular heart beat noted at home. Admit to SDU> on IV heparin and nitropaste.  ASSESSMENT: [facility]. [facility] hip fx (pt was consuming 50-100% of meals during previous admission).  Attempted to speak with pt multiple times, however, was in with numerous providers at times of visits. Nutrition-focused physical exam deferred at this time.  Pt was followed by RD during previous hospitalization. Pt has hx of poor appetite x 1 year. Wt hx also reveals a 11.4% wt loss in the past 6 months. Wt changes are difficult to assess due to pt hx of CHF, on diuretics.  Pt is currently NPO. Noted Ensure Enlive supplement ordered. RD will substitute order with Glucerna shake, due to elevated blood sugars.  Labs reviewed. Calcium: 8.3, Mg: 1.1, Glucose: 203,  CBGS: 179-206. Latest Hgb A1c: 10.8. Noted pt on steroids PTA and currently, which may be a contributing factor to poorly controlled blood sugars.   Height: Ht Readings from Last 1 Encounters:  07/06/14 6\' 3"  (1.905 m)    Weight: Wt Readings from Last 1 Encounters:  07/06/14 232 lb 12.9 oz (105.6 kg)    Ideal Body Weight: 196#  % Ideal Body Weight: 118%  Wt Readings from Last 20 Encounters:  07/06/14 232 lb 12.9 oz (105.6 kg)  06/23/14 230 lb (104.327 kg)  06/08/14 225 lb 9.6 oz (102.331 kg)  05/10/14 227 lb 8 oz (103.193 kg)  04/25/14 232 lb (105.235 kg)  03/21/14 242 lb (109.77 kg)  02/26/14 240 lb 9.6 oz (109.135 kg)  12/14/13 262 lb (118.842 kg)  09/10/13 252 lb (114.306 kg)  08/10/13 260 lb (117.935 kg)  08/01/13 265 lb 10.5 oz (120.5 kg)  07/05/13 254 lb (115.214 kg)  05/03/13 261 lb (118.389 kg)  04/29/13 266 lb (120.657 kg)  04/19/13 275 lb (124.739 kg)  02/24/13 276 lb (125.193 kg)  01/26/13 281 lb (127.461 kg)  01/18/13 276 lb 7.3 oz (125.4 kg)  09/04/11 318 lb (144.244 kg)  08/06/11 315 lb (142.883 kg)   Usual Body Weight: 260#  % Usual Body Weight: 89%  BMI:  Body mass index is 29.1 kg/(m^2). Overweight  Estimated Nutritional Needs: Kcal: 2100-2300 Protein: 100-110 grams Fluid: 2.1-2.3 L  Skin: closed lt hip incision  Diet Order:  NPO  EDUCATION NEEDS: -Education not appropriate at this time   Intake/Output Summary (Last 24 hours) at 07/06/14  1610 Last data filed at 07/06/14 0700  Gross per 24 hour  Intake    405 ml  Output   1100 ml  Net   -695 ml    Last BM: 07/04/14  Labs:   Recent Labs Lab 06/28/2014 1644 06/09/2014 2050 07/06/14 0436  NA 137  --  138  K 4.4  --  3.7  CL 95*  --  99  CO2 31  --  27  BUN 18  --  16  CREATININE 1.04  --  0.96  CALCIUM 8.1*  --  8.3*  MG  --  1.1*  --   GLUCOSE 255*  --  203*    CBG (last 3)   Recent Labs  06/07/2014 2147 07/06/14 0809  GLUCAP 179* 206*   Lab Results  Component Value  Date   HGBA1C 10.8* 06/25/2014   Scheduled Meds: . antiseptic oral rinse  7 mL Mouth Rinse BID  . aspirin  325 mg Oral Daily  . atorvastatin  80 mg Oral q1800  . budesonide  0.25 mg Nebulization BID  . carvedilol  6.25 mg Oral BID WC  . ceFEPime (MAXIPIME) IV  1 g Intravenous 3 times per day  . clopidogrel  75 mg Oral Daily  . digoxin  0.25 mg Oral Daily  . feeding supplement (ENSURE ENLIVE)  237 mL Oral BID BM  . insulin aspart  0-20 Units Subcutaneous TID WC  . insulin aspart  0-5 Units Subcutaneous QHS  . insulin detemir  20 Units Subcutaneous BID  . isosorbide mononitrate  60 mg Oral Daily  . methylPREDNISolone (SOLU-MEDROL) injection  60 mg Intravenous Q12H  . oseltamivir  75 mg Oral BID  . polyethylene glycol  17 g Oral Daily  . potassium chloride SA  20 mEq Oral BID  . sertraline  50 mg Oral Daily  . sodium chloride  3 mL Intravenous Q12H  . vancomycin  2,000 mg Intravenous Once  . vancomycin  1,000 mg Intravenous Q12H  . vitamin B-12  1,000 mcg Oral Daily    Continuous Infusions: . heparin 1,800 Units/hr (07/06/14 0847)    Past Medical History  Diagnosis Date  . Uncontrolled diabetes mellitus     a. A1C 12.8 in 01/2013.  [name] Chronic systolic heart failure   . Obesity   . Hyperlipidemia   . Peripheral vascular disease   . Hypertension   . Asthma   . CAD (coronary artery disease)     a. Prior CABG 20 yrs ago (?~1994). b. Hx of multiple stents, prev followed in [place]. c. [place] 01/2013: cath moderate disease in LAD, distal LCx, prox RCA occluded, 2 SVGs occluded, felt to be stable from prior cath -> for medical therapy.  . Ischemic cardiomyopathy     a. EF 2012: 55-60. b. EF 12/2011: 25-30%. c. Echo 01/2013: EF 15-20%.  . Stroke     15 years ago  . Arthritis   . Anxiety   . Depression   . 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 Chronic respiratory failure     a. Due to COPD.  . Osteomyelitis 2013    was  planned for chronic suppressive antibiotics but cannot afford this any longer  . CHF (congestive heart failure)   . Diabetes mellitus without complication     Past Surgical History  Procedure Laterality Date  . Coronary artery bypass graft    . Coronary stent  placement  2008    CABG grafts closed  . Joint replacement    . Cholecystectomy    . Eye surgery    . I&d extremity  06/12/2011    Procedure: IRRIGATION AND DEBRIDEMENT EXTREMITY;  Surgeon: Javier Docker, MD;  Location: [facility] OR;  Service: Orthopedics;  Laterality: Right;  I&D Right [place]  . Left and right heart catheterization with coronary/graft angiogram N/A 01/15/2013    Procedure: LEFT AND RIGHT HEART CATHETERIZATION WITH Isabel Caprice;  Surgeon: Alvia Grove, MD;  Location: [facility] CATH LAB;  Service: Cardiovascular;  Laterality: N/A;  . Hip pinning,cannulated Left 06/25/2014    Procedure: CANNULATED HIP PINNING;  Surgeon: Tarry Kos, MD;  Location: [facility] OR;  Service: Orthopedics;  Laterality: Left;    Keshon Markovitz A. Mayford Knife, RD, LDN, CDE Pager: [phone] After hours Pager: [phone]

## 2014-07-06 NOTE — Consult Note (Signed)
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  Was d/c and returned 3/29 with c/o SOB and chest tightness, lightheadedness.  Admitted by [facility] and treated for acute on chronic sCHF with diuresis.  On 3/30 pt developed worsening SOB, hypotension and PCCM consulted.   STUDIES:  CTA chest 3/30 >>> I personally reviewed, neg PE, B GGI superimposed on chronic ILD suspicious for pulmonary edema, bilat small effusion   SIGNIFICANT EVENTS:   HISTORY OF PRESENT ILLNESS:  64 yo male with hx DM, chronic sCHF, end-stage cardiomyopathy (EF 15-20%), severe O2 dependent COPD recent admission for hip fx with surgical repair on 3/19.  Was d/c and returned 3/29 with c/o SOB and chest tightness, lightheadedness.  Admitted by [facility] and treated for acute on chronic sCHF with diuresis.  On 3/30 pt developed worsening SOB, hypotension and PCCM consulted.  Currently c/o gen malaise, SOB, occasional cough but unable to expectorate.  Denies chest pain, orthopnea, BLE edema, PND.    PAST MEDICAL HISTORY :   has a past medical history of Uncontrolled diabetes mellitus; Chronic systolic heart failure; Obesity; Hyperlipidemia; Peripheral vascular disease; Hypertension; Asthma; CAD (coronary artery disease); Ischemic cardiomyopathy; Stroke; Arthritis; Anxiety; Depression; COPD, severe  O2 dependent (01/16/2013); Chronic respiratory failure; Osteomyelitis (2013); CHF (congestive heart failure); and Diabetes mellitus without complication.  has past surgical history that includes Coronary artery bypass graft; Coronary stent placement (2008); Joint replacement; Cholecystectomy; Eye  surgery; I&D extremity (06/12/2011); left and right heart catheterization with coronary/graft angiogram (N/A, 01/15/2013); and Hip pinning, cannulated (Left, 06/25/2014). Prior to Admission medications   Medication Sig Start Date End Date Taking? Authorizing Provider  ALPRAZolam (XANAX) 0.25 MG tablet Take 1 tablet (0.25 mg total) by mouth 2 (two) times daily as needed for anxiety. 05/10/14  Yes Kari Baars, MD  aspirin 325 MG tablet Take 325 mg by mouth daily.   Yes Historical Provider, MD  atorvastatin (LIPITOR) 80 MG tablet Take 1 tablet (80 mg total) by mouth daily. 06/09/14  Yes Kari Baars, MD  budesonide (PULMICORT) 0.25 MG/2ML nebulizer solution Take 2 mLs (0.25 mg total) by nebulization 2 (two) times daily. 06/09/14  Yes Kari Baars, MD  carvedilol (COREG) 6.25 MG tablet Take 1 tablet (6.25 mg total) by mouth 2 (two) times daily. 04/25/14  Yes Jodelle Gross, NP  clopidogrel (PLAVIX) 75 MG tablet Take 1 tablet (75 mg total) by mouth daily. 04/25/14  Yes Jodelle Gross, NP  digoxin (LANOXIN) 0.25 MG tablet Take 1 tablet (0.25 mg total) by mouth daily. 06/09/14  Yes Kari Baars, MD  insulin NPH Human (HUMULIN N,NOVOLIN N) 100 UNIT/ML injection Inject 0.4 mLs (40 Units total) into the skin daily before breakfast. Patient taking differently: Inject 110 Units into the skin 2 (two) times daily.  06/09/14  Yes Kari Baars, MD  isosorbide mononitrate (IMDUR) 60 MG 24 hr tablet Take 1 tablet (60 mg total) by mouth daily. 04/25/14  Yes Jodelle Gross, NP  nitroGLYCERIN (NITROSTAT) 0.4 MG SL tablet Place 1 tablet (0.4 mg total) under the tongue every 5 (five) minutes as needed for chest pain. 04/25/14  Yes Jodelle Gross, NP  Nutritional  Supplements (ENSURE LIGHT PO) Take 1 Can by mouth 2 (two) times daily.   Yes Historical Provider, MD  ondansetron (ZOFRAN) 4 MG tablet Take 1 tablet (4 mg total) by mouth every 6 (six) hours as needed for nausea. 06/09/14  Yes Kari Baars, MD   oxyCODONE-acetaminophen (PERCOCET) 10-325 MG per tablet Take 0.5-1 tablets by mouth every 4 (four) hours as needed for pain.    Yes Historical Provider, MD  polyethylene glycol (MIRALAX / GLYCOLAX) packet Take 17 g by mouth daily. 06/09/14  Yes Kari Baars, MD  potassium chloride SA (K-DUR,KLOR-CON) 20 MEQ tablet Take 1 tablet (20 mEq total) by mouth 2 (two) times daily. 06/09/14  Yes Kari Baars, MD  sertraline (ZOLOFT) 50 MG tablet Take 1 tablet (50 mg total) by mouth daily. 05/10/14  Yes Kari Baars, MD  torsemide (DEMADEX) 20 MG tablet Take 1 tablet (20 mg total) by mouth 2 (two) times daily. 06/09/14  Yes Kari Baars, MD  vitamin B-12 (CYANOCOBALAMIN) 1000 MCG tablet Take 1,000 mcg by mouth daily.   Yes Historical Provider, MD  vitamin C (ASCORBIC ACID) 500 MG tablet Take 1,000 mg by mouth at bedtime.   Yes Historical Provider, MD  vitamin E 400 UNIT capsule Take 400 Units by mouth at bedtime.    Yes Historical Provider, MD  acetaminophen (TYLENOL) 325 MG tablet Take 2 tablets (650 mg total) by mouth every 6 (six) hours as needed for mild pain (or Fever >/= 101). 06/09/14   Kari Baars, MD  diphenhydrAMINE (BENADRYL) 50 MG tablet Take 1 tablet (50 mg total) by mouth every 6 (six) hours as needed for itching. 05/10/14   Kari Baars, MD  feeding supplement, ENSURE COMPLETE, (ENSURE COMPLETE) LIQD Take 237 mLs by mouth 2 (two) times daily between meals. Patient not taking: Reported on 06/11/2014 06/09/14   Kari Baars, MD  hydrocortisone 2.5 % cream Apply 1 application topically daily as needed (itching).  04/25/14   Historical Provider, MD  levalbuterol Pauline Aus) 0.63 MG/3ML nebulizer solution Take 3 mLs (0.63 mg total) by nebulization every 6 (six) hours as needed for wheezing or shortness of breath. 06/09/14   Kari Baars, MD   Allergies  Allergen Reactions  . Baycol [Cerivastatin] Other (See Comments)    Patient states it "about killed me" kidney shut down. Black stools.  . Stadol  [Butorphanol Tartrate] Other (See Comments)    "makes him crazy"    FAMILY HISTORY:  indicated that his mother is deceased. He indicated that his father is deceased. He indicated that his brother is deceased.  SOCIAL HISTORY:  reports that he quit smoking about 31 years ago. He started smoking about 57 years ago. He has never used smokeless tobacco. He reports that he does not drink alcohol or use illicit drugs.  REVIEW OF SYSTEMS:   As per HPI - All other systems reviewed and were neg.    SUBJECTIVE:   VITAL SIGNS: Temp:  [97.8 F (36.6 C)-98.7 F (37.1 C)] 97.8 F (36.6 C) (03/30 1155) Pulse Rate:  [64-127] 97 (03/30 1155) Resp:  [13-31] 22 (03/30 1300) BP: (73-142)/(41-117) 78/57 mmHg (03/30 1300) SpO2:  [93 %-100 %] 96 % (03/30 1300) Weight:  [232 lb 12.9 oz (105.6 kg)-260 lb (117.935 kg)] 232 lb 12.9 oz (105.6 kg) (03/30 0539) HEMODYNAMICS:   VENTILATOR SETTINGS:   INTAKE / OUTPUT:  Intake/Output Summary (Last 24 hours) at 07/06/14 1351 Last data filed at 07/06/14 1300  Gross per 24 hour  Intake 1282.83 ml  Output  1300 ml  Net -17.17 ml    PHYSICAL EXAMINATION: General:  Chronically ill appearing male, NAD  Neuro:  Awake, alert, gen weakness  HEENT:  Mm dry, pale, no JVD  Cardiovascular:  s1s2 rrr 90's, NSR  Lungs: resps even, mildly labored on [place], diminished throughout, no audible wheeze  Abdomen:  Round, soft, non tender, +bs  Musculoskeletal:  Warm and dry, no edema   LABS:  CBC  Recent Labs Lab 06/18/2014 1644 07/06/14 0436  WBC 12.5* 12.8*  HGB 10.3* 11.2*  HCT 31.8* 35.0*  PLT 269 287   Coag's  Recent Labs Lab 06/18/2014 1644  INR 1.06   BMET  Recent Labs Lab 06/14/2014 1644 07/06/14 0436  NA 137 138  K 4.4 3.7  CL 95* 99  CO2 31 27  BUN 18 16  CREATININE 1.04 0.96  GLUCOSE 255* 203*   Electrolytes  Recent Labs Lab 07/04/2014 1644 07/03/2014 2050 07/06/14 0436  CALCIUM 8.1*  --  8.3*  MG  --  1.1*  --    Sepsis Markers No  results for input(s): LATICACIDVEN, PROCALCITON, O2SATVEN in the last 168 hours. ABG No results for input(s): PHART, PCO2ART, PO2ART in the last 168 hours. Liver Enzymes  Recent Labs Lab 07/06/14 0436  AST 13  ALT 9  ALKPHOS 99  BILITOT 0.6  ALBUMIN 3.2*   Cardiac Enzymes  Recent Labs Lab 07/04/2014 2331 07/06/14 0215 07/06/14 0436  TROPONINI 0.12* 0.15* 0.17*   Glucose  Recent Labs Lab 07/04/2014 2147 07/06/14 0809 07/06/14 1200  GLUCAP 179* 206* 184*    Imaging Dg Chest 2 View  07/02/2014   CLINICAL DATA:  Chest pain today, shortness of breath for 2 days. Left hip surgery 1 week ago.  EXAM: CHEST  2 VIEW  COMPARISON:  06/23/2014  FINDINGS: Prior CABG. Heart is mildly enlarged. Diffuse chronic interstitial opacities throughout the lungs. Increasing opacities throughout the right lung. Trace bilateral effusions noted. No acute bony abnormality.  IMPRESSION: Cardiomegaly.  Severe underlying chronic interstitial lung disease.  New increasing opacity throughout the right lower lobe and right upper lobe. Cannot exclude superimposed pneumonia.  Trace bilateral effusions.   Electronically Signed   By: Charlett NoseKevin  Dover M.D.   On: 06/08/2014 17:41   Ct Angio Chest Pe W/cm &/or Wo Cm  06/13/2014   CLINICAL DATA:  Chest pain, shortness of Breath  EXAM: CT ANGIOGRAPHY CHEST WITH CONTRAST  TECHNIQUE: Multidetector CT imaging of the chest was performed using the standard protocol during bolus administration of intravenous contrast. Multiplanar CT image reconstructions and MIPs were obtained to evaluate the vascular anatomy.  CONTRAST:  100mL OMNIPAQUE IOHEXOL 350 MG/ML SOLN  COMPARISON:  05/08/2014  FINDINGS: Sagittal images of the spine shows degenerative changes mid and lower thoracic spine.  Central airways are patent. Atherosclerotic calcifications of thoracic aorta. Again noted status post median sternotomy. Coronary arteries calcifications.  Borderline cardiomegaly. The visualized upper  abdomen is unremarkable. Bilateral small pleural effusion. There is bilateral interstitial thickening and peripheral thickening of interlobular septa. There is some fluid along the right major fissure. Patchy central ground-glass parenchymal attenuation bilateral upper and lower lobes. Findings are highly suspicious for moderate pulmonary edema superimposed on chronic interstitial lung disease. Less likely bilateral diffuse pulmonary infiltrates. Clinical correlation is necessary. There is sparing of the right middle lobe. There is bilateral mild perihilar peribronchial thickening suspicious for mild superimposed bronchitic changes.  A right precarinal lymph node measures 1.5 by 1 cm. Pretracheal lymph node measures 1 by 1  cm. No significant hilar adenopathy.  Review of the MIP images confirms the above findings.  IMPRESSION: 1. No pulmonary embolus. 2. There is bilateral interstitial prominence especially in upper lobes and lower lobes with hazy bilateral parenchymal opacification highly suspicious for pulmonary edema. Findings most likely superimposed on chronic interstitial lung disease. Bilateral small pleural effusion. Small amount of fluid along the right major fissure. 3. Bilateral mild perihilar peribronchial thickening suspicious for superimposed bronchitic changes. 4. Mild mediastinal adenopathy.  No significant hilar adenopathy. 5.   Electronically Signed   By: Natasha Mead M.D.   On: 07/04/2014 18:59     ASSESSMENT / PLAN:  PULMONARY Acute hypoxemic respiratory failure - r/t HCAP, decompensated sCHF with underlying severe COPD. This presentation may very well be explained by cardiogenic edema but inability to tolerate aggressive diuresis. Consider also an underlying ILD (less likely) Severe COPD  Apparent HCAP P:   D/c tamiflu - neg flu PCR Empiric abx as below  BD's - atrovent, xopenex, pulmicort  Check ABG  D/c solumedrol -- takes pred 10mg  daily maintenance. Could consider a steroid trial  to treat non-infectious pneumonitis. I would recommend treating for other probable causes before committing to this.  Supplemental O2 as needed to keep sats >92% Sputum culture if able  If he gets intubated then should have FOB for cx data  CARDIOVASCULAR Hypotension  Acute on chronic sCHF  End stage cardiomyopathy - EF 20-25% Hx CAD s/p CABG  Elevated troponin - mild, possible stress NSTEMI P:  Stress steroids  Hold further diuresis for now as he has developed hypotension ASA  D/c heparin gtt  D/c nitropaste with hypotension  F/u BNP  F/u troponin  Hold coreg for now  Cont imdur, digoxin. If he remains hypotensive then would stop the Imdur Check stat lactate, pct   RENAL No active issue  P:   F/u chem   GASTROINTESTINAL No active issue  P:   NPO for now   HEMATOLOGIC Leukocytosis  P:  Sq heparin   INFECTIOUS ?HCAP  P:   BCx2 3/30>>> UC 3/30>>> Sputum 3/30>>>  Vancomycin 3/30>>> Zosyn 3/30>>> azithro 3/30 >>   ENDOCRINE DM P:   SSI, lantus   NEUROLOGIC No active issue  P:   Supportive care   ORTHO  L Hip fx  P:  Remove staples 3/31 PT/OT when stable    FAMILY  - Updates:  No family available 3/30  Previously DNR with palliative care referral as outpt. Now full code at the urging of his family (per RN).  Need to have ongoing discussions regarding goals of care given severe cardiomyopathy, severe O2 dependent COPD, significant sCHF.  He would not survive CPR, is extremely high risk for vent dependence if he were to be intubated. Transfer to ICU   Dirk Dress, NP 07/06/2014  1:51 PM Pager: [phone] or [phone]  Attending Note:  I have examined patient, reviewed labs, studies and notes. I have discussed the case with Jasper Riling, and I agree with the data and plans as amended above. Patient has acute decompensation of his multifactorial chronic respiratory failure. Most concerning potential contributor is multilobar HCAP vs CAP.  He may indeed need diuresis but is not currently tolerating. High risk for sepsis and septic shock. on exam he is currently comfortable but earlier was in distress, dyspneic, hypotensive, diaphoretic. Will plan to hold diuresis temporarily to assess BP trend, broaden abx, move to ICU for close monitoring. Independent critical care time is 40 minutes.  Levy Pupa, MD, PhD 07/06/2014, 3:17 PM [facility] [phone] or if no answer [phone]

## 2014-07-06 NOTE — Care Management Note (Signed)
    Page 1 of 1   07/06/2014     10:05:52 AM CARE MANAGEMENT NOTE 07/06/2014  Patient:  Timothy Trujillo,Timothy Trujillo   Account Number:  [id number]  Date Initiated:  07/06/2014  Documentation initiated by:  Timothy Trujillo,Timothy Trujillo  Subjective/Objective Assessment:   adm w chf     Action/Plan:   lives at home, act w [facility]   Anticipated DC Date:     Anticipated DC Plan:  HOME W HOME HEALTH SERVICES         [facility]  Resumption Of Svcs/PTA Provider   Choice offered to / List presented to:          [facility]  [facility]  HH[facility]2 PT      [facility]  [facility]   Status of service:   Medicare Important Message given?   (If response is "NO", the following Medicare IM given date fields will be blank) Date Medicare IM given:   Medicare IM given by:   Date Additional Medicare IM given:   Additional Medicare IM given by:    Discharge Disposition:    Per UR Regulation:  Reviewed for med. necessity/level of care/duration of stay  If discussed at Long Length of Stay Meetings, dates discussed:    Comments:  3/30 1004a Timothy Trujillo Angla Delahunt rn,bsn alerted [facility] [place] of adm.

## 2014-07-06 NOTE — Progress Notes (Signed)
Telemetry notified nurse of 16 beat run of VTAC. Pt A&O, laying in bed. Pt denies chest pain or any other pain at this time. VSS BP 100/56 HR 99.  Notified Dr Alvester MorinNewton, who ordered STAT EKG. Continuing to monitor pt.

## 2014-07-06 NOTE — Progress Notes (Signed)
PT Cancellation Note  Patient Details Name: Timothy Trujillo MRN: [id number] DOB: [birth year]   Cancelled Treatment:    Reason Eval/Treat Not Completed: Medical issues which prohibited therapy (Pt with elevated troponins and cardiology consult pending.)   [facility] Psych[facility] Children'S CenterMAYCOCK,Timothy Shirer 07/06/2014, 9:28 AM  [facility] Versie Soave PT [phone]

## 2014-07-06 NOTE — Progress Notes (Signed)
Inpatient Diabetes Program Recommendations  AACE/ADA: New Consensus Statement on Inpatient Glycemic Control (2013)  Target Ranges:  Prepandial:   less than 140 mg/dL      Peak postprandial:   less than 180 mg/dL (1-2 hours)      Critically ill patients:  140 - 180 mg/dL   Reason for Visit: Acute on Chronic Systolic Heart Failure  Diabetes history: DM 2 Outpatient Diabetes medications: Humalin N (NPH) 110 units BID Current orders for Inpatient glycemic control: Levemir 20 units BID, Novolog 0-20 units TID, Novolog 0-5 units QHS  Note: Spoke with patient to verify home dose of NPH and what he was discharged on on previous admissions. Also spoke to patient about A1c level. Patient does indeed take NPH 110 units BID. Patient discussed going to Dr. Juanetta GoslingHawkins (PCP) as an outpatient and stated that Dr. Juanetta GoslingHawkins still wanted him to take the NPH 110 units BID at time of discharge. Patient discusses distrust in doctors. Patient also told me he had lost weight from being 400lbs to current weight in the 230's. He mentioned how doctors had told him how his diabetes and other health issues would go away with the weight loss and here he is having been in the hospital three times since the beginning of the year. I praised him for his success in his weight loss. We spoke briefly about diet and how much he liked fruit and mostly ate lean chicken. Patient also mentioned how he liked to eat beans. I discussed his current inpatient dosing of Lantus. The patient had no other questions about DM at this time.  Thanks,  Christena DeemShannon Benoit Meech RN, MSN, [facility] Center[facility] Inpatient Diabetes Coordinator Team Pager [phone]

## 2014-07-06 NOTE — Progress Notes (Addendum)
Patient complaining of increased weakness and lightheadedness. Patient appears pale and diaphoretic. BP remains soft, 87/59, HR 99. Dr. David StallFeliz Ortiz made aware, new orders for 500cc NS bolus. Will continue to monitor.   Update 12:00: Patient still hypotensive after NS bolus. Patient states he feels about the same. Dr. David StallFeliz Ortiz aware, another 500cc bolus ordered. Will continue to monitor closely.  Toula MoosABERION, Kashmir Lysaght J

## 2014-07-06 NOTE — Progress Notes (Addendum)
ANTIBIOTIC CONSULT NOTE - INITIAL  Pharmacy Consult for vancomycin Indication: PNA  Allergies  Allergen Reactions  . Baycol [Cerivastatin] Other (See Comments)    Patient states it "about killed me" kidney shut down. Black stools.  . Stadol [Butorphanol Tartrate] Other (See Comments)    "makes him crazy"    Patient Measurements: Height: 6\' 3"  (190.5 cm) Weight: 232 lb 12.9 oz (105.6 kg) IBW/kg (Calculated) : 84.5  Vital Signs: Temp: 97.8 F (36.6 C) (03/30 1155) Temp Source: Oral (03/30 1155) BP: 78/57 mmHg (03/30 1300) Pulse Rate: 97 (03/30 1155) Intake/Output from previous day: 03/29 0701 - 03/30 0700 In: 405 [P.O.:240; I.V.:165] Out: 1100 [Urine:1100] Intake/Output from this shift: Total I/O In: 877.8 [P.O.:717; I.V.:110.8; IV Piggyback:50] Out: 200 [Urine:200]  Labs:  Recent Labs  06/24/2014 1644 07/06/14 0436  WBC 12.5* 12.8*  HGB 10.3* 11.2*  PLT 269 287  CREATININE 1.04 0.96   Estimated Creatinine Clearance: 100.8 mL/min (by C-G formula based on Cr of 0.96). No results for input(s): VANCOTROUGH, VANCOPEAK, VANCORANDOM, GENTTROUGH, GENTPEAK, GENTRANDOM, TOBRATROUGH, TOBRAPEAK, TOBRARND, AMIKACINPEAK, AMIKACINTROU, AMIKACIN in the last 72 hours.   Microbiology: Recent Results (from the past 720 hour(s))  Surgical pcr screen     Status: None   Collection Time: 06/24/14  6:30 PM  Result Value Ref Range Status   MRSA, PCR NEGATIVE NEGATIVE Final   Staphylococcus aureus NEGATIVE NEGATIVE Final    Comment:        The Xpert SA Assay (FDA approved for NASAL specimens in patients over 321 years of age), is one component of a comprehensive surveillance program.  Test performance has been validated by [facility] for patients greater than or equal to 66 year old. It is not intended to diagnose infection nor to guide or monitor treatment.   MRSA PCR Screening     Status: None   Collection Time: 07/06/14  5:57 AM  Result Value Ref Range Status   MRSA by PCR  NEGATIVE NEGATIVE Final    Comment:        The GeneXpert MRSA Assay (FDA approved for NASAL specimens only), is one component of a comprehensive MRSA colonization surveillance program. It is not intended to diagnose MRSA infection nor to guide or monitor treatment for MRSA infections.     Medical History: Past Medical History  Diagnosis Date  . Uncontrolled diabetes mellitus     a. A1C 12.8 in 01/2013.  [name]. Chronic systolic heart failure   . Obesity   . Hyperlipidemia   . Peripheral vascular disease   . Hypertension   . Asthma   . CAD (coronary artery disease)     a. Prior CABG 20 yrs ago (?~1994). b. Hx of multiple stents, prev [place]. c. [place] 01[place]: cath moderate disease in LAD, distal LCx, prox RCA occluded, 2 SVGs occluded, felt to be stable from prior cath -> for medical therapy.  . Ischemic cardiomyopathy     a. EF 2012: 55-60. b. EF 12/2011: 25-30%. c. Echo 01/2013: EF 15-20%.  . Stroke     15 years ago  . Arthritis   . Anxiety   . Depression   . 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 Chronic respiratory failure     a. Due to COPD.  . Osteomyelitis 2013    was planned for chronic suppressive antibiotics but cannot afford this any longer  . CHF (congestive heart failure)   .  Diabetes mellitus without complication     Medications:  Prescriptions prior to admission  Medication Sig Dispense Refill Last Dose  . ALPRAZolam (XANAX) 0.25 MG tablet Take 1 tablet (0.25 mg total) by mouth 2 (two) times daily as needed for anxiety. 60 tablet 5 Past Month at Unknown time  . aspirin 325 MG tablet Take 325 mg by mouth daily.   07/04/2014 at Unknown time  . atorvastatin (LIPITOR) 80 MG tablet Take 1 tablet (80 mg total) by mouth daily. 90 tablet 3 07/04/2014 at Unknown time  . budesonide (PULMICORT) 0.25 MG/2ML nebulizer solution Take 2 mLs (0.25 mg total) by nebulization 2 (two) times daily. 60 mL 12 07/04/2014 at  Unknown time  . carvedilol (COREG) 6.25 MG tablet Take 1 tablet (6.25 mg total) by mouth 2 (two) times daily. 180 tablet 3 07/04/2014 at Unknown time  . clopidogrel (PLAVIX) 75 MG tablet Take 1 tablet (75 mg total) by mouth daily. 90 tablet 3 07/04/2014 at Unknown time  . digoxin (LANOXIN) 0.25 MG tablet Take 1 tablet (0.25 mg total) by mouth daily. 30 tablet 12 07/04/2014 at Unknown time  . insulin NPH Human (HUMULIN N,NOVOLIN N) 100 UNIT/ML injection Inject 0.4 mLs (40 Units total) into the skin daily before breakfast. (Patient taking differently: Inject 110 Units into the skin 2 (two) times daily. ) 10 mL 11 07/04/2014 at Unknown time  . isosorbide mononitrate (IMDUR) 60 MG 24 hr tablet Take 1 tablet (60 mg total) by mouth daily. 90 tablet 3 07/04/2014 at Unknown time  . nitroGLYCERIN (NITROSTAT) 0.4 MG SL tablet Place 1 tablet (0.4 mg total) under the tongue every 5 (five) minutes as needed for chest pain. 25 tablet 3 2014-07-11 at Unknown time  . Nutritional Supplements (ENSURE LIGHT PO) Take 1 Can by mouth 2 (two) times daily.   Past Month at Unknown time  . ondansetron (ZOFRAN) 4 MG tablet Take 1 tablet (4 mg total) by mouth every 6 (six) hours as needed for nausea. 20 tablet 0 Past Month at Unknown time  . oxyCODONE-acetaminophen (PERCOCET) 10-325 MG per tablet Take 0.5-1 tablets by mouth every 4 (four) hours as needed for pain.    Past Week at Unknown time  . polyethylene glycol (MIRALAX / GLYCOLAX) packet Take 17 g by mouth daily. 14 each 0 Past Month at Unknown time  . potassium chloride SA (K-DUR,KLOR-CON) 20 MEQ tablet Take 1 tablet (20 mEq total) by mouth 2 (two) times daily.   11-Jul-2014 at Unknown time  . sertraline (ZOLOFT) 50 MG tablet Take 1 tablet (50 mg total) by mouth daily. 30 tablet 12 07/04/2014 at Unknown time  . torsemide (DEMADEX) 20 MG tablet Take 1 tablet (20 mg total) by mouth 2 (two) times daily.   07/04/2014 at Unknown time  . vitamin B-12 (CYANOCOBALAMIN) 1000 MCG tablet Take  1,000 mcg by mouth daily.   07/04/2014 at Unknown time  . vitamin C (ASCORBIC ACID) 500 MG tablet Take 1,000 mg by mouth at bedtime.   07/04/2014 at Unknown time  . vitamin E 400 UNIT capsule Take 400 Units by mouth at bedtime.    07/04/2014 at Unknown time  . acetaminophen (TYLENOL) 325 MG tablet Take 2 tablets (650 mg total) by mouth every 6 (six) hours as needed for mild pain (or Fever >/= 101).   unk  . diphenhydrAMINE (BENADRYL) 50 MG tablet Take 1 tablet (50 mg total) by mouth every 6 (six) hours as needed for itching. 120 tablet 5 unknown  .  feeding supplement, ENSURE COMPLETE, (ENSURE COMPLETE) LIQD Take 237 mLs by mouth 2 (two) times daily between meals. (Patient not taking: Reported on Jul 16, 2014)   Past Month at Unknown time  . hydrocortisone 2.5 % cream Apply 1 application topically daily as needed (itching).    unknown  . levalbuterol (XOPENEX) 0.63 MG/3ML nebulizer solution Take 3 mLs (0.63 mg total) by nebulization every 6 (six) hours as needed for wheezing or shortness of breath. 3 mL 12    Scheduled:  . antiseptic oral rinse  7 mL Mouth Rinse BID  . aspirin  325 mg Oral Daily  . atorvastatin  80 mg Oral q1800  . budesonide  0.25 mg Nebulization BID  . carvedilol  6.25 mg Oral BID WC  . ceFEPime (MAXIPIME) IV  1 g Intravenous 3 times per day  . clopidogrel  75 mg Oral Daily  . digoxin  0.25 mg Oral Daily  . feeding supplement (GLUCERNA SHAKE)  237 mL Oral TID BM  . hydrocortisone sod succinate (SOLU-CORTEF) inj  50 mg Intravenous Q6H  . insulin aspart  0-20 Units Subcutaneous TID WC  . insulin aspart  0-5 Units Subcutaneous QHS  . insulin detemir  20 Units Subcutaneous BID  . ipratropium  0.5 mg Nebulization Q6H  . isosorbide mononitrate  60 mg Oral Daily  . levalbuterol  0.63 mg Nebulization Q6H  . polyethylene glycol  17 g Oral Daily  . potassium chloride SA  20 mEq Oral BID  . sertraline  50 mg Oral Daily  . sodium chloride  3 mL Intravenous Q12H  . vancomycin  2,000 mg  Intravenous Once  . vancomycin  1,000 mg Intravenous Q12H  . vitamin B-12  1,000 mcg Oral Daily     Assessment: 64 yo male transferred from [facility] with CP and SOB. Pharmacy has been consulted to dose vancomycin (he is also noted on cefepime and to transition to zosyn) for possible PNA. WBC= 12.8, afebrile, SCr= 0.96 and CrCl ~ 100.   3/30 zosyn>> 3/30 vanc 3/30 cefepime>> 3/30 3/30 tamiflu >> 3/30  3/30 blood x2 3/30 resp  Goal of Therapy:  Vancomycin trough level 15-20 mcg/ml  Plan:  -Vancomycin  IV followed by  IV q12h -Zosyn 3.375gm IV q8h -Will follow renal function, cultures and clinical progress  Harland German, Pharm D 07/06/2014 1:46 PM

## 2014-07-06 NOTE — Evaluation (Addendum)
Patient with progressively worsening demand ischemia in the setting of treatment for acute on chronic systolic heart failure. Also with nonsustained V. tach. Currently on heparin drip as well as Nitropaste. Heart rate sustained into the 110s. EKG w/ sinus tach and ? Lateral ischemia. Patient symptomatically feels weak. No chest pain currently. Discussed case with on-call cardiology fellow at [facility].  Plan transfer [facility] stepdown bed IV metoprolol 2.5 mg 1 Cont heparin gtt and nitropaste

## 2014-07-07 ENCOUNTER — Inpatient Hospital Stay (HOSPITAL_COMMUNITY): Payer: Commercial Managed Care - HMO

## 2014-07-07 DIAGNOSIS — I959 Hypotension, unspecified: Secondary | ICD-10-CM

## 2014-07-07 DIAGNOSIS — R0600 Dyspnea, unspecified: Secondary | ICD-10-CM

## 2014-07-07 LAB — CBC
HEMATOCRIT: 31.4 % — AB (ref 39.0–52.0)
Hemoglobin: 10.2 g/dL — ABNORMAL LOW (ref 13.0–17.0)
MCH: 29.1 pg (ref 26.0–34.0)
MCHC: 32.5 g/dL (ref 30.0–36.0)
MCV: 89.7 fL (ref 78.0–100.0)
Platelets: 282 10*3/uL (ref 150–400)
RBC: 3.5 MIL/uL — AB (ref 4.22–5.81)
RDW: 14.8 % (ref 11.5–15.5)
WBC: 15.7 10*3/uL — AB (ref 4.0–10.5)

## 2014-07-07 LAB — BASIC METABOLIC PANEL
ANION GAP: 10 (ref 5–15)
BUN: 18 mg/dL (ref 6–23)
CALCIUM: 8.6 mg/dL (ref 8.4–10.5)
CHLORIDE: 97 mmol/L (ref 96–112)
CO2: 28 mmol/L (ref 19–32)
Creatinine, Ser: 1.02 mg/dL (ref 0.50–1.35)
GFR calc Af Amer: 87 mL/min — ABNORMAL LOW (ref >90)
GFR calc non Af Amer: 75 mL/min — ABNORMAL LOW (ref >90)
GLUCOSE: 304 mg/dL — AB (ref 70–99)
POTASSIUM: 4.3 mmol/L (ref 3.5–5.1)
SODIUM: 135 mmol/L (ref 135–145)

## 2014-07-07 LAB — GLUCOSE, CAPILLARY
GLUCOSE-CAPILLARY: 310 mg/dL — AB (ref 70–99)
GLUCOSE-CAPILLARY: 155 mg/dL — AB (ref 70–99)
GLUCOSE-CAPILLARY: 259 mg/dL — AB (ref 70–99)
Glucose-Capillary: 256 mg/dL — ABNORMAL HIGH (ref 70–99)

## 2014-07-07 LAB — PROCALCITONIN: Procalcitonin: <0.1 ng/mL

## 2014-07-07 LAB — LEGIONELLA ANTIGEN, URINE

## 2014-07-07 LAB — HIV ANTIBODY (ROUTINE TESTING W REFLEX): HIV Screen 4th Generation wRfx: NONREACTIVE

## 2014-07-07 LAB — TROPONIN I: Troponin I: 0.1 ng/mL — ABNORMAL HIGH (ref <0.031)

## 2014-07-07 MED ORDER — SODIUM CHLORIDE 0.9 % IJ SOLN
10.0000 mL | INTRAMUSCULAR | Status: DC | PRN
  Administered 2014-07-09: 10 mL
  Filled 2014-07-07: qty 40

## 2014-07-07 MED ORDER — SODIUM CHLORIDE 0.9 % IJ SOLN
10.0000 mL | Freq: Two times a day (BID) | INTRAMUSCULAR | Status: DC
  Administered 2014-07-07: 30 mL
  Administered 2014-07-07 – 2014-07-12 (×8): 10 mL
  Administered 2014-07-13: 20 mL

## 2014-07-07 MED ORDER — FUROSEMIDE 10 MG/ML IJ SOLN
INTRAMUSCULAR | Status: AC
  Filled 2014-07-07: qty 2

## 2014-07-07 MED ORDER — FUROSEMIDE 10 MG/ML IJ SOLN
20.0000 mg | Freq: Two times a day (BID) | INTRAMUSCULAR | Status: DC
  Administered 2014-07-07 – 2014-07-08 (×2): 20 mg via INTRAVENOUS
  Filled 2014-07-07 (×3): qty 2

## 2014-07-07 NOTE — Progress Notes (Signed)
PT Cancellation Note  Patient Details Name: Huntley DecJames L Stead MRN: [id number] DOB: Jan 12, 1951   Cancelled Treatment:    Reason Eval/Treat Not Completed: Medical issues which prohibited therapy (Pt feels too dyspneic to attempt mobility)   Leaira Fullam 07/07/2014, 3:07 PM  [facility] PT [phone]

## 2014-07-07 NOTE — Progress Notes (Signed)
Results for [name], Draden L (MRN [id number]) as of 07/07/2014 09:11  Ref. Range 07/06/2014 08:09 07/06/2014 12:00 07/06/2014 17:00 07/06/2014 21:26 07/07/2014 08:11  Glucose-Capillary Latest Range: 70-99 mg/dL 725206 (H) 366184 (H) 440288 (H) 338 (H) 256 (H)  CBGs continue to be elevated. Recommend increasing Levemir to 25 units daily if CBGs greater than 180 mg/dl and continue Novolog RESISTANT correction scale TID & HS. Will continue to follow while in hospital. Smith MinceKendra Drena Ham RN BSN CDE

## 2014-07-07 NOTE — Progress Notes (Signed)
OT Cancellation Note  Patient Details Name: Timothy Trujillo MRN: [id number] DOB: [birth year]   Cancelled Treatment:    Reason Eval/Treat Not Completed: Medical issues which prohibited therapy (Pt with chest tightness, will continue to follow.)  Evern BioMayberry, Hansini Clodfelter Lynn 07/07/2014, 11:12 AM

## 2014-07-07 NOTE — Progress Notes (Signed)
NUTRITION FOLLOW-UP  INTERVENTION: Glucerna Shake po TID, each supplement provides 220 kcal and 10 grams of protein  NUTRITION DIAGNOSIS: Inadequate oral intake related to hx of decreased appetite as evidenced by reported hx; ongoing.   Goal: Pt will meet >90% of estimated nutritional needs; progressing.   Monitor:  PO/supplement intake, labs, weight changes, I/O's  ASSESSMENT: [facility] on chronic systolic heart failure. [facility] was [facility] for lt hip fx (pt was consuming 50-100% of meals during previous admission).   Discussed pt with RN. Pt consumed 90% of his breakfast this am and drank a Glucerna Shake.  Pt sleeping soundly at time of visit.  Height: Ht Readings from Last 1 Encounters:  07/06/14 6\' 3"  (1.905 m)    Weight: Wt Readings from Last 1 Encounters:  07/07/14 241 lb 2.9 oz (109.4 kg)    BMI:  Body mass index is 30.15 kg/(m^2). Overweight  Estimated Nutritional Needs: Kcal: 2100-2300 Protein: 100-110 grams Fluid: 2.1-2.3 L  Skin: closed lt hip incision  Diet Order: Diet Heart Room service appropriate?: Yes; Fluid consistency:: ThinNPO   Intake/Output Summary (Last 24 hours) at 07/07/14 1351 Last data filed at 07/07/14 1200  Gross per 24 hour  Intake 1127.5 ml  Output   1108 ml  Net   19.5 ml    Last BM: 07/04/14  Labs:   Recent Labs Lab 06/22/2014 1644 06/23/2014 2050 07/06/14 0436 07/07/14 0937  NA 137  --  138 135  K 4.4  --  3.7 4.3  CL 95*  --  99 97  CO2 31  --  27 28  BUN 18  --  16 18  CREATININE 1.04  --  0.96 1.02  CALCIUM 8.1*  --  8.3* 8.6  MG  --  1.1*  --   --   GLUCOSE 255*  --  203* 304*    CBG (last 3)   Recent Labs  07/06/14 2126 07/07/14 0811 07/07/14 1124  GLUCAP 338* 256* 310*   Lab Results  Component Value Date   HGBA1C 10.8* 06/25/2014   Scheduled Meds: . antiseptic oral rinse  7 mL Mouth Rinse BID  . aspirin  325 mg Oral Daily  . atorvastatin  80 mg Oral q1800   . azithromycin  500 mg Intravenous Q24H  . budesonide  0.25 mg Nebulization BID  . clopidogrel  75 mg Oral Daily  . digoxin  0.25 mg Oral Daily  . feeding supplement (GLUCERNA SHAKE)  237 mL Oral TID BM  . heparin subcutaneous  5,000 Units Subcutaneous 3 times per day  . hydrocortisone sod succinate (SOLU-CORTEF) inj  50 mg Intravenous Q6H  . insulin aspart  0-20 Units Subcutaneous TID WC  . insulin aspart  0-5 Units Subcutaneous QHS  . insulin detemir  20 Units Subcutaneous BID  . ipratropium  0.5 mg Nebulization Q6H  . isosorbide mononitrate  60 mg Oral Daily  . levalbuterol  0.63 mg Nebulization Q6H  . piperacillin-tazobactam (ZOSYN)  IV  3.375 g Intravenous Q8H  . polyethylene glycol  17 g Oral Daily  . sertraline  50 mg Oral Daily  . sodium chloride  3 mL Intravenous Q12H  . vancomycin  1,000 mg Intravenous Q12H  . vitamin B-12  1,000 mcg Oral Daily    Continuous Infusions: . sodium chloride 10 mL/hr at 07/06/14 [address]   Ajanee Buren RD, LDN, CNSC [phone] Pager [phone] After Hours Pager

## 2014-07-07 NOTE — Progress Notes (Signed)
Pt c/o 3/10 chest tightness when breathing in.  12 lead ecg obtained.  Pt just received breathing treatment about 45 min ago.  Dr. Robb Matarrtiz notified.  Will continue to monitor.

## 2014-07-07 NOTE — Progress Notes (Signed)
Peripherally Inserted Central Catheter/Midline Placement  The IV Nurse has discussed with the patient and/or persons authorized to consent for the patient, the purpose of this procedure and the potential benefits and risks involved with this procedure.  The benefits include less needle sticks, lab draws from the catheter and patient may be discharged home with the catheter.  Risks include, but not limited to, infection, bleeding, blood clot (thrombus formation), and puncture of an artery; nerve damage and irregular heat beat.  Alternatives to this procedure were also discussed.  PICC/Midline Placement Documentation        Vevelyn PatDuncan, [name] [name] 07/07/2014, 2:19 PM

## 2014-07-07 NOTE — Progress Notes (Signed)
Pt states "I can't breath." C/o of "gurgling" in lungs.  O2 sat 96% on 5L.  Expiratory wheezing present in bilateral lungs.  CVP 13.  Dr. Robb Matarrtiz notified.  Orders received for 20mg  iv lasix, chest xray, and cancel transfer order.  Will continue to monitor.

## 2014-07-07 NOTE — Progress Notes (Signed)
PULMONARY / CRITICAL CARE MEDICINE   Name: Timothy Trujillo MRN: [id number] DOB: 09/16/1950    ADMISSION DATE:  03-Aug-2014 CONSULTATION DATE:  3/30  REFERRING MD :  Feliz-ortiz ([facility])   CHIEF COMPLAINT:  Hypotension   INITIAL PRESENTATION: 64 yo male with hx DM, chronic sCHF, end-stage cardiomyopathy (EF 20%), severe O2 dependent COPD recent admission for hip fx with surgical repair on 3/19.  Was d/c and returned 3/29 with c/o SOB and chest tightness, lightheadedness.  Admitted by [facility] and treated for acute on chronic sCHF with diuresis.  On 3/30 pt developed worsening SOB, hypotension and PCCM consulted.   STUDIES:  CTA chest 3/30 >>> I personally reviewed, neg PE, B GGI superimposed on chronic ILD suspicious for pulmonary edema, bilat small effusion   SIGNIFICANT EVENTS:  3/30 transfer to the ICU for hypotension and worsening SOB.  SUBJECTIVE: No events overnight.  VITAL SIGNS: Temp:  [97.3 F (36.3 C)-98.2 F (36.8 C)] 97.3 F (36.3 C) (03/31 1100) Pulse Rate:  [52-116] 112 (03/31 1200) Resp:  [9-27] 19 (03/31 1200) BP: (54-140)/(30-114) 126/71 mmHg (03/31 1200) SpO2:  [81 %-100 %] 100 % (03/31 1200) Weight:  [109.4 kg (241 lb 2.9 oz)] 109.4 kg (241 lb 2.9 oz) (03/31 0500) HEMODYNAMICS:   VENTILATOR SETTINGS:   INTAKE / OUTPUT:  Intake/Output Summary (Last 24 hours) at 07/07/14 1459 Last data filed at 07/07/14 1200  Gross per 24 hour  Intake 1127.5 ml  Output   1108 ml  Net   19.5 ml    PHYSICAL EXAMINATION: General:  Chronically ill appearing male, NAD  Neuro:  Awake, alert, gen weakness  HEENT:  Mm dry, pale, no JVD  Cardiovascular:  s1s2 rrr 90's, NSR  Lungs: resps even, mildly labored on [place], diminished throughout, no audible wheeze  Abdomen:  Round, soft, non tender, +bs  Musculoskeletal:  Warm and dry, no edema   LABS:  CBC  Recent Labs Lab 2014-08-03 1644 07/06/14 0436 07/07/14 0937  WBC 12.5* 12.8* 15.7*  HGB 10.3* 11.2* 10.2*  HCT 31.8* 35.0*  31.4*  PLT 269 287 282   Coag's  Recent Labs Lab 03-Aug-2014 1644  INR 1.06   BMET  Recent Labs Lab August 03, 2014 1644 07/06/14 0436 07/07/14 0937  NA 137 138 135  K 4.4 3.7 4.3  CL 95* 99 97  CO2 BUN CREATININE 1.04 0.96 1.02  GLUCOSE 255* 203* 304*   Electrolytes  Recent Labs Lab 08-03-14 1644 2014-08-03 2050 07/06/14 0436 07/07/14 0937  CALCIUM 8.1*  --  8.3* 8.6  MG  --  1.1*  --   --    Sepsis Markers  Recent Labs Lab 07/06/14 1326 07/06/14 1328 07/07/14 0308  LATICACIDVEN  --  1.8  --   PROCALCITON 0.14  --  <0.10   ABG  Recent Labs Lab 07/06/14 1714  PHART 7.468*  PCO2ART 39.8  PO2ART 81.0   Liver Enzymes  Recent Labs Lab 07/06/14 0436  AST 13  ALT 9  ALKPHOS 99  BILITOT 0.6  ALBUMIN 3.2*   Cardiac Enzymes  Recent Labs Lab 07/06/14 1326 07/06/14 2009 07/07/14 0045  TROPONINI 0.15* 0.13* 0.10*   Glucose  Recent Labs Lab 07/06/14 0809 07/06/14 1200 07/06/14 1700 07/06/14 2126 07/07/14 0811 07/07/14 1124  GLUCAP 206* 184* 288* 338* 256* 310*    Imaging Dg Chest Port 1 View  07/06/2014   CLINICAL DATA:  Shortness of breath.  EXAM: PORTABLE CHEST - 1 VIEW  COMPARISON:  CT 07/27/2014. Chest x-ray 07/30/2014, 05/07/2014, 02/25/2014.  FINDINGS: Mediastinum hilar structures are stable. Prior CABG. Stable cardiomegaly. Persistent diffuse pulmonary interstitial prominence likely secondary to congestive heart failure pulmonary interstitial edema. Underlying chronic interstitial lung disease most likely present. Small bilateral effusions. No pneumothorax.  IMPRESSION: 1. Prior CABG. Persistent cardiomegaly with diffuse bilateral pulmonary interstitial prominence and small pleural effusions consistent with persistent congestive heart failure. 2. Underlying chronic interstitial lung disease. Active pneumonitis cannot be excluded.   Electronically Signed   By: Maisie Fushomas  Register   On: 07/06/2014 08:07     ASSESSMENT /  PLAN:  PULMONARY Acute hypoxemic respiratory failure - r/t HCAP, decompensated sCHF with underlying severe COPD. This presentation may very well be explained by cardiogenic edema but inability to tolerate aggressive diuresis. Consider also an underlying ILD (less likely) Severe COPD  Apparent HCAP C/O new anxiety and inability to catch his breath which is new. P:   D/c tamiflu - neg flu PCR Empiric abx as below  BD's - atrovent, xopenex, pulmicort  ABG noted, resp alkalosis. D/c solumedrol -- takes pred 10mg  daily maintenance. Could consider a steroid trial to treat non-infectious pneumonitis. I would recommend treating for other probable causes before committing to this.  Supplemental O2 as needed to keep sats >92% Sputum culture if able  If he gets intubated then should have FOB for cx data  CARDIOVASCULAR Hypotension  Acute on chronic sCHF  End stage cardiomyopathy - EF 20-25% Hx CAD s/p CABG  Elevated troponin - mild, possible stress NSTEMI P:  Stress steroids  Hold further diuresis for now as he has developed hypotension ASA  D/c heparin gtt  D/c nitropaste with hypotension  BNP 1014 Troponin decreasing to 0.10 Hold coreg for now  Cont imdur, digoxin. Pct  <0.10 making infection less likely. Lactic acid 1.8  RENAL No active issue  P:   F/u chem  Replace electrolytes as indicated.  GASTROINTESTINAL No active issue  P:   Begin heart healthy diet.  HEMATOLOGIC Leukocytosis  P:  Sq heparin   INFECTIOUS ?HCAP  P:   BCx2 3/30>>> UC 3/30>>> Sputum 3/30>>>  Vancomycin 3/30>>> Zosyn 3/30>>> azithro 3/30 >>   ENDOCRINE DM P:   SSI, lantus   NEUROLOGIC No active issue  P:   Supportive care   ORTHO  L Hip fx  P:  Remove staples 3/31 PT/OT when stable   FAMILY  - Updates:  No family available 3/30  I reviewed the CT myself, likely pulmonary edema on top of previously existing pneumonitis consistent with history of silicosis.  [facility]  [facility]  Continue to increase his O2 for now, hold off BiPAP.  KVO IVF. And continue to monitor.  Previously DNR with palliative care referral as outpt. Now full code at the urging of his family (per RN).  Need to have ongoing discussions regarding goals of care given severe cardiomyopathy, severe O2 dependent COPD, significant sCHF.  He would not survive CPR, is extremely high risk for vent dependence if he were to be intubated. In the meantime, will transfer to SDU and back to [facility] service.  PCCM will sign off, please call back if needed.  Alyson ReedyWesam G. Yacoub, M.D. [facility]. Pager: [phone]. After hours pager: [phone].  07/07/2014, 2:59 PM

## 2014-07-07 NOTE — Progress Notes (Addendum)
[facility] PROGRESS NOTE Interim History: 64 y.o. male with ischemic cardiomyopathy with an EF of 20-25%, status post CABG, severe COPD oxygen dependent diabetes mellitus type 2 comes in with chest pain Chest pain. Started around 12:00 Described as chest tightness. Associated with shortness of breath, lightheaded, chilled and w/ radiation to the neck. SOB for the previous 2 days. Home O2 as low as 71% and very irregular heart beat noted at home. Admit to SDU> on IV heparin and nitropaste. Became hypotensive on 3.30.2016 stopped coreg, lasix and imdur, gave bolus of NS with persistent hypotension consult PCCM.  Filed Weights   07/01/2014 2116 07/06/14 0539 07/07/14 0500  Weight: 107.502 kg (237 lb) 105.6 kg (232 lb 12.9 oz) 109.4 kg (241 lb 2.9 oz)        Intake/Output Summary (Last 24 hours) at 07/07/14 4098 Last data filed at 07/07/14 0600  Gross per 24 hour  Intake 1385.33 ml  Output   1158 ml  Net 227.33 ml     Assessment/Plan: Acute on chronic c respiratory failure with hypoxia: - Unclear etiology of his shortness of breath and chest pain, ddx acute decompensated heart failure versus healthcare associated pneumonia.  - Held Bp medications lactate 1.8, pro calcitonin low. b-met pending. - Appreciate PCCM assistance in this difficult case. - might to measure CVP measurment hard to evaluate fluid status. - did not tolerate diuresis. - Insert PICC line.  Elevated troponins: - markers trended down. - coreg, and nitro held.  Depression with anxiety: - Continue Zoloft and Xanax.  History of Femoral neck fracture: - PT OT consult in the morning.  Diabetes mellitus type 2, uncontrolled: - Poorly controlled we'll start him on Levemir twice a day continue sliding scale insulin. - bg sugar high due to stress dose,   Essential  Hypertension/hypotension: - Hypotension resolved with folding antihypertensive and with Fluid bolus. - Multifactorial due to infection,  medications and relative adrenal insufficiency.  Sinus tachycardia: - Beta blocker held on 3.30.2016 due to hypotensive episode.   Code Status: full Family Communication: none  Disposition Plan: inpatient   Consultants:  cardiology  Procedures: ECHO: none  Antibiotics:  vanc and cefepime 3.30.2016  HPI/Subjective: He relates he feels slightly better today. SOb improved.  Objective: Filed Vitals:   07/07/14 0400 07/07/14 0500 07/07/14 0600 07/07/14 0700  BP: 140/109 120/81 123/81 108/72  Pulse: 105 110    Temp: 97.6 F (36.4 C)     TempSrc: Oral     Resp: 20 17 19 16   Height:      Weight:  109.4 kg (241 lb 2.9 oz)    SpO2: 100% 99%       Exam:  General: Alert, awake, oriented x3, in no acute distress.  HEENT: No bruits, no goiter. -JVD Heart: Regular rate and rhythm, no edema. Lungs: Good air movement, crackles on his right upper and upper lung.  Abdomen: Soft, nontender, nondistended, positive bowel sounds.    Data Reviewed: Basic Metabolic Panel:  Recent Labs Lab 06/28/2014 1644 06/27/2014 2050 07/06/14 0436  NA 137  --  138  K 4.4  --  3.7  CL 95*  --  99  CO2 31  --  27  GLUCOSE 255*  --  203*  BUN 18  --  16  CREATININE 1.04  --  0.96  CALCIUM 8.1*  --  8.3*  MG  --  1.1*  --    Liver Function Tests:  Recent Labs Lab 07/06/14 0436  AST  13  ALT 9  ALKPHOS 99  BILITOT 0.6  PROT 6.8  ALBUMIN 3.2*   No results for input(s): LIPASE, AMYLASE in the last 168 hours. No results for input(s): AMMONIA in the last 168 hours. CBC:  Recent Labs Lab 06/28/2014 1644 07/06/14 0436  WBC 12.5* 12.8*  NEUTROABS 10.3*  --   HGB 10.3* 11.2*  HCT 31.8* 35.0*  MCV 89.1 90.2  PLT 269 287   Cardiac Enzymes:  Recent Labs Lab 07/06/14 0215 07/06/14 0436 07/06/14 1326 07/06/14 2009 07/07/14 0045  TROPONINI 0.15* 0.17* 0.15* 0.13* 0.10*   BNP (last 3 results)  Recent Labs  05/06/14 2355 06/04/14 0325 06/15/2014 1644  BNP 646.0* 531.0*  934.0*    ProBNP (last 3 results)  Recent Labs  07/30/13 1523 02/25/14 2200  PROBNP 604.2* 1014.0*    CBG:  Recent Labs Lab 07/06/2014 2147 07/06/14 0809 07/06/14 1200 07/06/14 1700 07/06/14 2126  GLUCAP 179* 206* 184* 288* 338*    Recent Results (from the past 240 hour(s))  MRSA PCR Screening     Status: None   Collection Time: 07/06/14  5:57 AM  Result Value Ref Range Status   MRSA by PCR NEGATIVE NEGATIVE Final    Comment:        The GeneXpert MRSA Assay (FDA approved for NASAL specimens only), is one component of a comprehensive MRSA colonization surveillance program. It is not intended to diagnose MRSA infection nor to guide or monitor treatment for MRSA infections.   Culture, blood (routine x 2) Call MD if unable to obtain prior to antibiotics being given     Status: None (Preliminary result)   Collection Time: 07/06/14  8:09 PM  Result Value Ref Range Status   Specimen Description BLOOD LEFT HAND  Final   Special Requests BOTTLES DRAWN AEROBIC ONLY 4CC  Final   Culture PENDING  Incomplete   Report Status PENDING  Incomplete     Studies: Dg Chest 2 View  06/11/2014   CLINICAL DATA:  Chest pain today, shortness of breath for 2 days. Left hip surgery 1 week ago.  EXAM: CHEST  2 VIEW  COMPARISON:  06/23/2014  FINDINGS: Prior CABG. Heart is mildly enlarged. Diffuse chronic interstitial opacities throughout the lungs. Increasing opacities throughout the right lung. Trace bilateral effusions noted. No acute bony abnormality.  IMPRESSION: Cardiomegaly.  Severe underlying chronic interstitial lung disease.  New increasing opacity throughout the right lower lobe and right upper lobe. Cannot exclude superimposed pneumonia.  Trace bilateral effusions.   Electronically Signed   By: Rolm Baptise M.D.   On: 06/11/2014 17:41   Ct Angio Chest Pe W/cm &/or Wo Cm  06/13/2014   CLINICAL DATA:  Chest pain, shortness of Breath  EXAM: CT ANGIOGRAPHY CHEST WITH CONTRAST  TECHNIQUE:  Multidetector CT imaging of the chest was performed using the standard protocol during bolus administration of intravenous contrast. Multiplanar CT image reconstructions and MIPs were obtained to evaluate the vascular anatomy.  CONTRAST:  116m OMNIPAQUE IOHEXOL 350 MG/ML SOLN  COMPARISON:  05/08/2014  FINDINGS: Sagittal images of the spine shows degenerative changes mid and lower thoracic spine.  Central airways are patent. Atherosclerotic calcifications of thoracic aorta. Again noted status post median sternotomy. Coronary arteries calcifications.  Borderline cardiomegaly. The visualized upper abdomen is unremarkable. Bilateral small pleural effusion. There is bilateral interstitial thickening and peripheral thickening of interlobular septa. There is some fluid along the right major fissure. Patchy central ground-glass parenchymal attenuation bilateral upper and lower lobes. Findings are  highly suspicious for moderate pulmonary edema superimposed on chronic interstitial lung disease. Less likely bilateral diffuse pulmonary infiltrates. Clinical correlation is necessary. There is sparing of the right middle lobe. There is bilateral mild perihilar peribronchial thickening suspicious for mild superimposed bronchitic changes.  A right precarinal lymph node measures 1.5 by 1 cm. Pretracheal lymph node measures 1 by 1 cm. No significant hilar adenopathy.  Review of the MIP images confirms the above findings.  IMPRESSION: 1. No pulmonary embolus. 2. There is bilateral interstitial prominence especially in upper lobes and lower lobes with hazy bilateral parenchymal opacification highly suspicious for pulmonary edema. Findings most likely superimposed on chronic interstitial lung disease. Bilateral small pleural effusion. Small amount of fluid along the right major fissure. 3. Bilateral mild perihilar peribronchial thickening suspicious for superimposed bronchitic changes. 4. Mild mediastinal adenopathy.  No significant  hilar adenopathy. 5.   Electronically Signed   By: Lahoma Crocker M.D.   On: 07/02/2014 18:59   Dg Chest Port 1 View  07/06/2014   CLINICAL DATA:  Shortness of breath.  EXAM: PORTABLE CHEST - 1 VIEW  COMPARISON:  CT 07/07/2014. Chest x-ray 06/16/2014, 05/07/2014, 02/25/2014.  FINDINGS: Mediastinum hilar structures are stable. Prior CABG. Stable cardiomegaly. Persistent diffuse pulmonary interstitial prominence likely secondary to congestive heart failure pulmonary interstitial edema. Underlying chronic interstitial lung disease most likely present. Small bilateral effusions. No pneumothorax.  IMPRESSION: 1. Prior CABG. Persistent cardiomegaly with diffuse bilateral pulmonary interstitial prominence and small pleural effusions consistent with persistent congestive heart failure. 2. Underlying chronic interstitial lung disease. Active pneumonitis cannot be excluded.   Electronically Signed   By: Marcello Moores  Register   On: 07/06/2014 08:07    Scheduled Meds: . antiseptic oral rinse  7 mL Mouth Rinse BID  . aspirin  325 mg Oral Daily  . atorvastatin  80 mg Oral q1800  . azithromycin  500 mg Intravenous Q24H  . budesonide  0.25 mg Nebulization BID  . clopidogrel  75 mg Oral Daily  . digoxin  0.25 mg Oral Daily  . feeding supplement (GLUCERNA SHAKE)  237 mL Oral TID BM  . heparin subcutaneous  5,000 Units Subcutaneous 3 times per day  . hydrocortisone sod succinate (SOLU-CORTEF) inj  50 mg Intravenous Q6H  . insulin aspart  0-20 Units Subcutaneous TID WC  . insulin aspart  0-5 Units Subcutaneous QHS  . insulin detemir  20 Units Subcutaneous BID  . ipratropium  0.5 mg Nebulization Q6H  . isosorbide mononitrate  60 mg Oral Daily  . levalbuterol  0.63 mg Nebulization Q6H  . piperacillin-tazobactam (ZOSYN)  IV  3.375 g Intravenous Q8H  . polyethylene glycol  17 g Oral Daily  . potassium chloride SA  20 mEq Oral BID  . sertraline  50 mg Oral Daily  . sodium chloride  3 mL Intravenous Q12H  . vancomycin   1,000 mg Intravenous Q12H  . vitamin B-12  1,000 mcg Oral Daily   Continuous Infusions: . sodium chloride 10 mL/hr at 07/06/14 2311     Charlynne Cousins  [facility] Pager [phone]. If 7PM-7AM, please contact night-coverage at [email or web address], password [facility] 07/07/2014, 7:28 AM  LOS: 2 days

## 2014-07-07 NOTE — Progress Notes (Signed)
CRITICAL VALUE ALERT  Critical value received:  Aerobic blood culture positive for gram + cocci and clusters  Date of notification:  07/07/14  Time of notification:  1830  Critical value read back:Yes.    Nurse who received alert:  Dayna BarkerLauren Williom Cedar, RN  MD notified (1st page):  Dr. David StallFeliz Ortiz  Time of first page:  1835  MD notified (2nd page):  Time of second page:  Responding MD:  Dr. David StallFeliz Ortiz  Time MD responded:  Sent over pager text message

## 2014-07-08 ENCOUNTER — Inpatient Hospital Stay (HOSPITAL_COMMUNITY): Payer: Commercial Managed Care - HMO

## 2014-07-08 LAB — TSH: TSH: 1.779 u[IU]/mL (ref 0.350–4.500)

## 2014-07-08 LAB — GLUCOSE, CAPILLARY
GLUCOSE-CAPILLARY: 195 mg/dL — AB (ref 70–99)
Glucose-Capillary: 199 mg/dL — ABNORMAL HIGH (ref 70–99)
Glucose-Capillary: 218 mg/dL — ABNORMAL HIGH (ref 70–99)
Glucose-Capillary: 230 mg/dL — ABNORMAL HIGH (ref 70–99)
Glucose-Capillary: 356 mg/dL — ABNORMAL HIGH (ref 70–99)

## 2014-07-08 LAB — CULTURE, BLOOD (ROUTINE X 2)

## 2014-07-08 LAB — PHOSPHORUS: PHOSPHORUS: 3.8 mg/dL (ref 2.3–4.6)

## 2014-07-08 LAB — CBC
HCT: 30.5 % — ABNORMAL LOW (ref 39.0–52.0)
Hemoglobin: 10 g/dL — ABNORMAL LOW (ref 13.0–17.0)
MCH: 29 pg (ref 26.0–34.0)
MCHC: 32.8 g/dL (ref 30.0–36.0)
MCV: 88.4 fL (ref 78.0–100.0)
Platelets: 303 10*3/uL (ref 150–400)
RBC: 3.45 MIL/uL — AB (ref 4.22–5.81)
RDW: 15.1 % (ref 11.5–15.5)
WBC: 16.4 10*3/uL — AB (ref 4.0–10.5)

## 2014-07-08 LAB — BASIC METABOLIC PANEL
ANION GAP: 7 (ref 5–15)
Anion gap: 11 (ref 5–15)
BUN: 17 mg/dL (ref 6–23)
BUN: 20 mg/dL (ref 6–23)
CALCIUM: 8.8 mg/dL (ref 8.4–10.5)
CHLORIDE: 94 mmol/L — AB (ref 96–112)
CO2: 32 mmol/L (ref 19–32)
CO2: 29 mmol/L (ref 19–32)
CREATININE: 0.95 mg/dL (ref 0.50–1.35)
Calcium: 8.4 mg/dL (ref 8.4–10.5)
Chloride: 99 mmol/L (ref 96–112)
Creatinine, Ser: 0.78 mg/dL (ref 0.50–1.35)
GFR calc Af Amer: >90 mL/min (ref >90)
GFR calc Af Amer: >90 mL/min (ref >90)
GFR calc non Af Amer: 85 mL/min — ABNORMAL LOW (ref >90)
GLUCOSE: 389 mg/dL — AB (ref 70–99)
Glucose, Bld: 197 mg/dL — ABNORMAL HIGH (ref 70–99)
Potassium: 3.8 mmol/L (ref 3.5–5.1)
Potassium: 4 mmol/L (ref 3.5–5.1)
SODIUM: 138 mmol/L (ref 135–145)
Sodium: 134 mmol/L — ABNORMAL LOW (ref 135–145)

## 2014-07-08 LAB — PROCALCITONIN: Procalcitonin: <0.1 ng/mL

## 2014-07-08 LAB — CARBOXYHEMOGLOBIN
CARBOXYHEMOGLOBIN: 1 % (ref 0.5–1.5)
Carboxyhemoglobin: 1.1 % (ref 0.5–1.5)
METHEMOGLOBIN: 0.9 % (ref 0.0–1.5)
Methemoglobin: 0.9 % (ref 0.0–1.5)
O2 Saturation: 33.4 %
O2 Saturation: 77.7 %
TOTAL HEMOGLOBIN: 8.8 g/dL — AB (ref 13.5–18.0)
Total hemoglobin: 10.8 g/dL — ABNORMAL LOW (ref 13.5–18.0)

## 2014-07-08 LAB — SEDIMENTATION RATE: Sed Rate: 53 mm/hr — ABNORMAL HIGH (ref 0–16)

## 2014-07-08 LAB — MAGNESIUM: MAGNESIUM: 1.3 mg/dL — AB (ref 1.5–2.5)

## 2014-07-08 MED ORDER — CHLORHEXIDINE GLUCONATE 0.12 % MT SOLN
15.0000 mL | Freq: Two times a day (BID) | OROMUCOSAL | Status: DC
  Administered 2014-07-08 – 2014-07-10 (×4): 15 mL via OROMUCOSAL
  Filled 2014-07-08 (×3): qty 15

## 2014-07-08 MED ORDER — PREDNISONE 10 MG PO TABS
10.0000 mg | ORAL_TABLET | Freq: Every day | ORAL | Status: DC
  Administered 2014-07-08: 10 mg via ORAL
  Filled 2014-07-08 (×2): qty 1

## 2014-07-08 MED ORDER — NOREPINEPHRINE BITARTRATE 1 MG/ML IV SOLN
0.0000 ug/min | INTRAVENOUS | Status: DC
  Administered 2014-07-08: 0.8 ug/min via INTRAVENOUS
  Filled 2014-07-08 (×4): qty 4

## 2014-07-08 MED ORDER — LORAZEPAM 2 MG/ML IJ SOLN
INTRAMUSCULAR | Status: AC
  Administered 2014-07-08: 0.5 mg via INTRAVENOUS
  Filled 2014-07-08: qty 1

## 2014-07-08 MED ORDER — LEVALBUTEROL HCL 0.63 MG/3ML IN NEBU
0.6300 mg | INHALATION_SOLUTION | Freq: Four times a day (QID) | RESPIRATORY_TRACT | Status: DC
  Administered 2014-07-08 – 2014-07-09 (×4): 0.63 mg via RESPIRATORY_TRACT
  Filled 2014-07-08 (×9): qty 3

## 2014-07-08 MED ORDER — FUROSEMIDE 10 MG/ML IJ SOLN
INTRAMUSCULAR | Status: AC
  Administered 2014-07-08: 40 mg via INTRAVENOUS
  Filled 2014-07-08: qty 4

## 2014-07-08 MED ORDER — CETYLPYRIDINIUM CHLORIDE 0.05 % MT LIQD
7.0000 mL | Freq: Two times a day (BID) | OROMUCOSAL | Status: DC
  Administered 2014-07-09 (×2): 7 mL via OROMUCOSAL

## 2014-07-08 MED ORDER — DILTIAZEM LOAD VIA INFUSION
15.0000 mg | Freq: Once | INTRAVENOUS | Status: AC
  Administered 2014-07-08: 15 mg via INTRAVENOUS
  Filled 2014-07-08: qty 15

## 2014-07-08 MED ORDER — FUROSEMIDE 10 MG/ML IJ SOLN
80.0000 mg | Freq: Once | INTRAMUSCULAR | Status: AC
  Administered 2014-07-08: 80 mg via INTRAVENOUS
  Filled 2014-07-08: qty 8

## 2014-07-08 MED ORDER — DIGOXIN 125 MCG PO TABS
0.1250 mg | ORAL_TABLET | Freq: Every day | ORAL | Status: DC
  Administered 2014-07-08 – 2014-07-11 (×4): 0.125 mg via ORAL
  Filled 2014-07-08 (×4): qty 1

## 2014-07-08 MED ORDER — CARVEDILOL 3.125 MG PO TABS
3.1250 mg | ORAL_TABLET | Freq: Two times a day (BID) | ORAL | Status: DC
  Filled 2014-07-08 (×3): qty 1

## 2014-07-08 MED ORDER — FUROSEMIDE 10 MG/ML IJ SOLN
40.0000 mg | Freq: Once | INTRAMUSCULAR | Status: AC
  Administered 2014-07-08: 40 mg via INTRAVENOUS
  Filled 2014-07-08: qty 4

## 2014-07-08 MED ORDER — AMIODARONE HCL IN DEXTROSE 360-4.14 MG/200ML-% IV SOLN
30.0000 mg/h | INTRAVENOUS | Status: DC
  Administered 2014-07-08 – 2014-07-12 (×7): 30 mg/h via INTRAVENOUS
  Filled 2014-07-08 (×21): qty 200

## 2014-07-08 MED ORDER — AMIODARONE HCL IN DEXTROSE 360-4.14 MG/200ML-% IV SOLN
INTRAVENOUS | Status: AC
  Administered 2014-07-08: 200 mL
  Filled 2014-07-08: qty 200

## 2014-07-08 MED ORDER — CEFAZOLIN SODIUM-DEXTROSE 2-3 GM-% IV SOLR
2.0000 g | Freq: Three times a day (TID) | INTRAVENOUS | Status: DC
  Administered 2014-07-08 – 2014-07-09 (×3): 2 g via INTRAVENOUS
  Filled 2014-07-08 (×4): qty 50

## 2014-07-08 MED ORDER — AMIODARONE LOAD VIA INFUSION
150.0000 mg | Freq: Once | INTRAVENOUS | Status: AC
  Administered 2014-07-08: 150 mg via INTRAVENOUS

## 2014-07-08 MED ORDER — INSULIN ASPART 100 UNIT/ML ~~LOC~~ SOLN
0.0000 [IU] | SUBCUTANEOUS | Status: DC
  Administered 2014-07-08: 9 [IU] via SUBCUTANEOUS
  Administered 2014-07-08: 2 [IU] via SUBCUTANEOUS
  Administered 2014-07-08 (×2): 3 [IU] via SUBCUTANEOUS
  Administered 2014-07-09 (×2): 7 [IU] via SUBCUTANEOUS
  Administered 2014-07-09: 3 [IU] via SUBCUTANEOUS
  Administered 2014-07-09: 5 [IU] via SUBCUTANEOUS
  Administered 2014-07-09: 3 [IU] via SUBCUTANEOUS
  Administered 2014-07-09: 2 [IU] via SUBCUTANEOUS
  Administered 2014-07-09: 5 [IU] via SUBCUTANEOUS
  Administered 2014-07-10 (×3): 1 [IU] via SUBCUTANEOUS
  Administered 2014-07-11: 3 [IU] via SUBCUTANEOUS
  Administered 2014-07-12 (×2): 7 [IU] via SUBCUTANEOUS
  Administered 2014-07-12: 5 [IU] via SUBCUTANEOUS
  Administered 2014-07-12: 3 [IU] via SUBCUTANEOUS

## 2014-07-08 MED ORDER — ALPRAZOLAM 0.25 MG PO TABS
0.2500 mg | ORAL_TABLET | Freq: Three times a day (TID) | ORAL | Status: DC | PRN
  Administered 2014-07-08 – 2014-07-10 (×5): 0.25 mg via ORAL
  Filled 2014-07-08 (×5): qty 1

## 2014-07-08 MED ORDER — CARVEDILOL 6.25 MG PO TABS
6.2500 mg | ORAL_TABLET | Freq: Two times a day (BID) | ORAL | Status: DC
  Filled 2014-07-08 (×3): qty 1

## 2014-07-08 MED ORDER — FUROSEMIDE 10 MG/ML IJ SOLN: 40.0000 mg | Freq: Two times a day (BID) | INTRAMUSCULAR | Status: DC

## 2014-07-08 MED ORDER — NITROGLYCERIN 0.4 MG SL SUBL
0.4000 mg | SUBLINGUAL_TABLET | SUBLINGUAL | Status: DC | PRN
  Administered 2014-07-08: 0.4 mg via SUBLINGUAL
  Filled 2014-07-08: qty 1

## 2014-07-08 MED ORDER — AMIODARONE HCL IN DEXTROSE 360-4.14 MG/200ML-% IV SOLN
60.0000 mg/h | INTRAVENOUS | Status: AC
  Administered 2014-07-08 (×2): 60 mg/h via INTRAVENOUS
  Filled 2014-07-08: qty 200

## 2014-07-08 MED ORDER — LEVALBUTEROL HCL 0.63 MG/3ML IN NEBU: 0.6300 mg | INHALATION_SOLUTION | RESPIRATORY_TRACT | Status: DC | PRN

## 2014-07-08 MED ORDER — DEXTROSE 5 % IV SOLN
5.0000 mg/h | INTRAVENOUS | Status: DC
  Filled 2014-07-08: qty 100

## 2014-07-08 MED ORDER — NITROGLYCERIN 0.4 MG SL SUBL
SUBLINGUAL_TABLET | SUBLINGUAL | Status: AC
  Filled 2014-07-08: qty 1

## 2014-07-08 MED ORDER — LORAZEPAM 2 MG/ML IJ SOLN
0.5000 mg | Freq: Once | INTRAMUSCULAR | Status: AC
  Administered 2014-07-08: 0.5 mg via INTRAVENOUS

## 2014-07-08 MED ORDER — IPRATROPIUM BROMIDE 0.02 % IN SOLN
0.5000 mg | Freq: Four times a day (QID) | RESPIRATORY_TRACT | Status: DC
  Administered 2014-07-08 – 2014-07-09 (×4): 0.5 mg via RESPIRATORY_TRACT
  Filled 2014-07-08 (×5): qty 2.5

## 2014-07-08 MED ORDER — AMIODARONE LOAD VIA INFUSION
150.0000 mg | Freq: Once | INTRAVENOUS | Status: AC
  Administered 2014-07-08: 150 mg via INTRAVENOUS
  Filled 2014-07-08: qty 83.34

## 2014-07-08 MED ORDER — FUROSEMIDE 10 MG/ML IJ SOLN
40.0000 mg | Freq: Once | INTRAMUSCULAR | Status: AC
  Administered 2014-07-08: 40 mg via INTRAVENOUS

## 2014-07-08 MED ORDER — METHYLPREDNISOLONE SODIUM SUCC 40 MG IJ SOLR
40.0000 mg | Freq: Four times a day (QID) | INTRAMUSCULAR | Status: DC
  Administered 2014-07-08 – 2014-07-09 (×4): 40 mg via INTRAVENOUS
  Filled 2014-07-08 (×8): qty 1

## 2014-07-08 MED ORDER — MAGNESIUM SULFATE 50 % IJ SOLN
6.0000 g | Freq: Once | INTRAVENOUS | Status: AC
  Administered 2014-07-08: 6 g via INTRAVENOUS
  Filled 2014-07-08: qty 12

## 2014-07-08 MED ORDER — MILRINONE IN DEXTROSE 20 MG/100ML IV SOLN
0.1250 ug/kg/min | INTRAVENOUS | Status: DC
  Administered 2014-07-08: 0.25 ug/kg/min via INTRAVENOUS
  Administered 2014-07-09 – 2014-07-12 (×4): 0.125 ug/kg/min via INTRAVENOUS
  Filled 2014-07-08 (×5): qty 100

## 2014-07-08 MED ORDER — METOPROLOL TARTRATE 1 MG/ML IV SOLN
INTRAVENOUS | Status: AC
  Administered 2014-07-08: 5 mg
  Filled 2014-07-08: qty 5

## 2014-07-08 NOTE — Progress Notes (Signed)
  Had good diuresis with lasix and milrinone. > 2L. Maintaining NSR on amio.  Co-ox now > 70%. CVP 9. Has gotten evening lasix.   Unfortunately remains bipap dependent for now due to severe underlying COPD.  Will continue to follow.   Abdalla Naramore,MD 7:11 PM

## 2014-07-08 NOTE — Progress Notes (Signed)
Pt HR 150s-200s, Very short of breath.  SPO2 60s-70s.  Dr. David StallFeliz Ortiz at bedside.  Pt placed on bipap.  Cardizem, metoprolol, amiodarone, nitro, and ativan given per orders.  See MD's note.  After medications and bipap pts HR slowed down to low 100s and O2 returned to 90s.   Will continue to monitor.

## 2014-07-08 NOTE — Progress Notes (Signed)
ANTIBIOTIC CONSULT NOTE - FOLLOW UP  Pharmacy Consult for ancef Indication: PNA  Allergies  Allergen Reactions  . Baycol [Cerivastatin] Other (See Comments)    Patient states it "about killed me" kidney shut down. Black stools.  . Stadol [Butorphanol Tartrate] Other (See Comments)    "makes him crazy"    Patient Measurements: Height:  (190.5 cm) Weight: 239 lb 3.2 oz (108.5 kg) IBW/kg (Calculated) : 84.5  Vital Signs: Temp: 97.4 F (36.3 C) (04/01 0800) Temp Source: Oral (04/01 0800) BP: 135/82 mmHg (04/01 1100) Pulse Rate: 125 (04/01 1100) Intake/Output from previous day: 03/31 0701 - 04/01 0700 In: 1590 [P.O.:400; I.V.:90; IV Piggyback:1100] Out: 2945 [Urine:2945] Intake/Output from this shift: Total I/O In: 1002.3 [I.V.:690.3; IV Piggyback:312] Out: 1750 [Urine:1750]  Labs:  Recent Labs  07/06/14 0436 07/07/14 0937 07/08/14 0600  WBC 12.8* 15.7* 16.4*  HGB 11.2* 10.2* 10.0*  PLT 287 282 303  CREATININE 0.96 1.02 0.78   Estimated Creatinine Clearance: 122.5 mL/min (by C-G formula based on Cr of 0.78). No results for input(s): VANCOTROUGH, VANCOPEAK, VANCORANDOM, GENTTROUGH, GENTPEAK, GENTRANDOM, TOBRATROUGH, TOBRAPEAK, TOBRARND, AMIKACINPEAK, AMIKACINTROU, AMIKACIN in the last 72 hours.   Microbiology: Recent Results (from the past 720 hour(s))  Surgical pcr screen     Status: None   Collection Time: 06/24/14  6:30 PM  Result Value Ref Range Status   MRSA, PCR NEGATIVE NEGATIVE Final   Staphylococcus aureus NEGATIVE NEGATIVE Final    Comment:        The Xpert SA Assay (FDA approved for NASAL specimens in patients over 26 years of age), is one component of a comprehensive surveillance program.  Test performance has been validated by [facility] for patients greater than or equal to 61 year old. It is not intended to diagnose infection nor to guide or monitor treatment.   MRSA PCR Screening     Status: None   Collection Time: 07/06/14  5:57 AM   Result Value Ref Range Status   MRSA by PCR NEGATIVE NEGATIVE Final    Comment:        The GeneXpert MRSA Assay (FDA approved for NASAL specimens only), is one component of a comprehensive MRSA colonization surveillance program. It is not intended to diagnose MRSA infection nor to guide or monitor treatment for MRSA infections.   Culture, blood (routine x 2) Call MD if unable to obtain prior to antibiotics being given     Status: None   Collection Time: 07/06/14 10:05 AM  Result Value Ref Range Status   Specimen Description BLOOD LEFT HAND  Final   Special Requests BOTTLES DRAWN AEROBIC ONLY 3CC  Final   Culture   Final    STAPHYLOCOCCUS SPECIES (COAGULASE NEGATIVE) Note: THE SIGNIFICANCE OF ISOLATING THIS ORGANISM FROM A SINGLE SET OF BLOOD CULTURES WHEN MULTIPLE SETS ARE DRAWN IS UNCERTAIN. PLEASE NOTIFY THE MICROBIOLOGY DEPARTMENT WITHIN ONE WEEK IF SPECIATION AND SENSITIVITIES ARE REQUIRED. Note: Gram Stain Report Called to,Read Back By and Verified With: LAUREN ON 2HC AT 1800 ON [id number] BY CASTC Performed at [facility]    Report Status 07/08/2014 FINAL  Final  Culture, blood (routine x 2) Call MD if unable to obtain prior to antibiotics being given     Status: None (Preliminary result)   Collection Time: 07/06/14  8:09 PM  Result Value Ref Range Status   Specimen Description BLOOD LEFT HAND  Final   Special Requests BOTTLES DRAWN AEROBIC ONLY 4CC  Final   Culture   Final  BLOOD CULTURE RECEIVED NO GROWTH TO DATE CULTURE WILL BE HELD FOR 5 DAYS BEFORE ISSUING A FINAL NEGATIVE REPORT [facility]    Report Status PENDING  Incomplete    Anti-infectives    Start     Dose/Rate Route Frequency Ordered Stop   07/07/14 0000  piperacillin-tazobactam (ZOSYN) IVPB 3.375 g  Status:  Discontinued     3.375 g 12.5 mL/hr over 240 Minutes Intravenous Every 8 hours 07/06/14 1505 07/08/14 0820   07/06/14 2200  vancomycin (VANCOCIN) IVPB 1000  mg/200 mL premix  Status:  Discontinued     1,000 mg 200 mL/hr over 60 Minutes Intravenous Every 12 hours 07/06/14 0852 07/08/14 1102   07/06/14 1800  azithromycin (ZITHROMAX) 500 mg in dextrose 5 % 250 mL IVPB  Status:  Discontinued     500 mg 250 mL/hr over 60 Minutes Intravenous Every 24 hours 07/06/14 1517 07/08/14 0820   07/06/14 1600  piperacillin-tazobactam (ZOSYN) IVPB 3.375 g     3.375 g 100 mL/hr over 30 Minutes Intravenous  Once 07/06/14 1505 07/06/14 1720   07/06/14 1000  vancomycin (VANCOCIN) 2,000 mg in sodium chloride 0.9 % 500 mL IVPB     2,000 mg 250 mL/hr over 120 Minutes Intravenous  Once 07/06/14 0852 07/06/14 1348   07/06/14 0800  ceFEPIme (MAXIPIME) 1 g in dextrose 5 % 50 mL IVPB  Status:  Discontinued     1 g 100 mL/hr over 30 Minutes Intravenous 3 times per day 07/06/14 0743 07/06/14 1349   07/06/14 0800  oseltamivir (TAMIFLU) capsule 75 mg  Status:  Discontinued     75 mg Oral 2 times daily 07/06/14 0743 07/06/14 1339      Assessment: 64 yo male on zosyn/vancomycin/azithromycin for possible PNA and to change to ancef for CONS in 1/2 blood cultures. WBC= 16.4, afeb, SCr= 0.78 and CrCl > 100.  4/1 ancef>> 3/30 zosyn>> 4/1 3/30 vanc 4/1 3/20 azithromycin> 4/1 3/30 cefepime >> 3/30 3/30 tamiflu >> 3/30  4/1 blood x2 3/30 blood x2 - CONS 1/2 3/30 resp   Plan:  -Ancef 2gm IV q8h -Will follow renal function, cultures and clinical progress  Harland Germanndrew Kenika Sahm, Pharm D 07/08/2014 12:34 PM

## 2014-07-08 NOTE — Progress Notes (Signed)
OT Cancellation Note  Patient Details Name: Timothy Trujillo MRN: [id number] DOB: 05/23/1950   Cancelled Treatment:    Reason Eval/Treat Not Completed: Medical issues which prohibited therapy (Pt now on bipap. Will continue to follow.)  Evern BioMayberry, Kayliah Tindol Lynn 07/08/2014, 10:17 AM

## 2014-07-08 NOTE — Progress Notes (Signed)
  Amiodarone Drug - Drug Interaction Consult Note  Recommendations: Amiodarone started for AFib with RVR in setting of EF 20%.  Statin thereapt - atorvastatin 80mg  daily is ok with amiodarone.  Digoxin 0.25 mg daily started 3/30 - will empirically decrease dose by 1/2 as amiodarone decreases digoxin clearance.    Amiodarone is metabolized by the cytochrome P450 system and therefore has the potential to cause many drug interactions. Amiodarone has an average plasma half-life of 50 days (range 20 to 100 days).   There is potential for drug interactions to occur several weeks or months after stopping treatment and the onset of drug interactions may be slow after initiating amiodarone.   [x]  Statins: Increased risk of myopathy. Simvastatin- restrict dose to 20mg  daily. Other statins: counsel patients to report any muscle pain or weakness immediately.  []  Anticoagulants: Amiodarone can increase anticoagulant effect. Consider warfarin dose reduction. Patients should be monitored closely and the dose of anticoagulant altered accordingly, remembering that amiodarone levels take several weeks to stabilize.  []  Antiepileptics: Amiodarone can increase plasma concentration of phenytoin, the dose should be reduced. Note that small changes in phenytoin dose can result in large changes in levels. Monitor patient and counsel on signs of toxicity.  []  Beta blockers: increased risk of bradycardia, AV block and myocardial depression. Sotalol - avoid concomitant use.  []   Calcium channel blockers (diltiazem and verapamil): increased risk of bradycardia, AV block and myocardial depression.  []   Cyclosporine: Amiodarone increases levels of cyclosporine. Reduced dose of cyclosporine is recommended.  [x]  Digoxin dose should be halved when amiodarone is started.  []  Diuretics: increased risk of cardiotoxicity if hypokalemia occurs.  []  Oral hypoglycemic agents (glyburide, glipizide, glimepiride): increased risk of  hypoglycemia. Patient's glucose levels should be monitored closely when initiating amiodarone therapy.   []  Drugs that prolong the QT interval:  Torsades de pointes risk may be increased with concurrent use - avoid if possible.  Monitor QTc, also keep magnesium/potassium WNL if concurrent therapy can't be avoided. [name]. Antibiotics: e.g. fluoroquinolones, erythromycin. . Antiarrhythmics: e.g. quinidine, procainamide, disopyramide, sotalol. . Antipsychotics: e.g. phenothiazines, haloperidol.  . Lithium, tricyclic antidepressants, and methadone. Thank You,  Marcelino ScotCurran, Avary Eichenberger Marie  07/08/2014 8:15 AM

## 2014-07-08 NOTE — Progress Notes (Signed)
[facility] PROGRESS NOTE Interim History: 64 y.o. male with ischemic cardiomyopathy with an EF of 20-25%, status post CABG, severe COPD oxygen dependent diabetes mellitus type 2 comes in with chest pain Chest pain. Started around 12:00 Described as chest tightness. Associated with shortness of breath, lightheaded, chilled and w/ radiation to the neck. SOB for the previous 2 days. Home O2 as low as 71% and very irregular heart beat noted at home. Admit to SDU> on IV heparin, IV lasix and nitropaste. Became hypotensive on 3.30.2016 stopped coreg, lasix and imdur, gave bolus of NS with persistent hypotension consult PCCM started broad-spectrum antibiotics history is dose steroids. Now Bp improved. Patient's went into A. fib with RVR in the 200s on 07/08/2014 with hypoxia was given IV Lasix, nitroglycerin and placed on BiPAP. His hypoxia improved  Filed Weights   07/06/14 0539 07/07/14 0500 07/08/14 0500  Weight: 105.6 kg (232 lb 12.9 oz) 109.4 kg (241 lb 2.9 oz) 108.5 kg (239 lb 3.2 oz)        Intake/Output Summary (Last 24 hours) at 07/08/14 0721 Last data filed at 07/08/14 0630  Gross per 24 hour  Intake   1590 ml  Output   2945 ml  Net  -1355 ml     Assessment/Plan: Acute on chronic c respiratory failure with hypoxia due Acute systolic heart failure: - BP has improved. On arrival this morning he was hypoxic with A. fib with RVR in the 200s. Was given IV Lasix, nitroglycerin, diltiazem and IV metoprolol his heart rate improved. He had to be placed on BiPAP with improvement in his saturations. - CVP was 13, he was started on Lasix low dose on 07/07/2014, tolerating it well overnight. - Started on empiric antibiotics vanc zosyn and azithro on 3.31.2016 stopped on 07/08/2014, has remained afebrile. Pro-calcitonin low. - Appreciate cardiology assistance. Co-ox is low.  Afib with RVR: - Gave diltiazem and metoprolol and HR improved to < 100. - Likely due to vol overload. -  Consulted cardiology repeat a chest x-ray that shows frank pulmonary edema.  Elevated troponins: - markers trended down.  Depression with anxiety: - Continue Zoloft and Xanax.  History of Femoral neck fracture: - PT OT consult in the morning.  Diabetes mellitus type 2, uncontrolled: - Poorly controlled, increased Levemir twice a day continue sliding scale insulin. - bg sugar high due to stress dose.   Essential  Hypertension/hypotension: - Hypotension resolved with holding antihypertensive and with Fluid bolus. - Multifactorial due to infection, medications and relative adrenal insufficiency.  Sinus tachycardia: - Beta blocker held on 3.30.2016 due to hypotensive episode.   Code Status: full Family Communication: none  Disposition Plan: inpatient   Consultants:  cardiology  Procedures: ECHO: none  Antibiotics:  vanc and cefepime 3.30.2016  HPI/Subjective: SOB worst this am.  Objective: Filed Vitals:   07/08/14 0401 07/08/14 0413 07/08/14 0500 07/08/14 0600  BP:   108/80 130/88  Pulse:   107 114  Temp:      TempSrc:      Resp:   17 19  Height:      Weight:   108.5 kg (239 lb 3.2 oz)   SpO2: 98% 99% 97% 96%     Exam:  General: Alert, awake, oriented x3, in no acute distress.  HEENT: No bruits, no goiter.  Heart: Regular rate and rhythm, no edema. Lungs: Good air movement, crackles on his right upper and upper lung.  Abdomen: Soft, nontender, nondistended, positive bowel sounds.    Data  Reviewed: Basic Metabolic Panel:  Recent Labs Lab 05-29-2014 1644 05-29-2014 2050 07/06/14 0436 07/07/14 0937  NA 137  --  138 135  K 4.4  --  3.7 4.3  CL 95*  --  99 97  CO2 31  --  27 28  GLUCOSE 255*  --  203* 304*  BUN 18  --  16 18  CREATININE 1.04  --  0.96 1.02  CALCIUM 8.1*  --  8.3* 8.6  MG  --  1.1*  --   --    Liver Function Tests:  Recent Labs Lab 07/06/14 0436  AST 13  ALT 9  ALKPHOS 99  BILITOT 0.6  PROT 6.8  ALBUMIN 3.2*   No  results for input(s): LIPASE, AMYLASE in the last 168 hours. No results for input(s): AMMONIA in the last 168 hours. CBC:  Recent Labs Lab 05-29-2014 1644 07/06/14 0436 07/07/14 0937 07/08/14 0600  WBC 12.5* 12.8* 15.7* 16.4*  NEUTROABS 10.3*  --   --   --   HGB 10.3* 11.2* 10.2* 10.0*  HCT 31.8* 35.0* 31.4* 30.5*  MCV 89.1 90.2 89.7 88.4  PLT 269 287 282 303   Cardiac Enzymes:  Recent Labs Lab 07/06/14 0215 07/06/14 0436 07/06/14 1326 07/06/14 2009 07/07/14 0045  TROPONINI 0.15* 0.17* 0.15* 0.13* 0.10*   BNP (last 3 results)  Recent Labs  05/06/14 2355 06/04/14 0325 05-29-2014 1644  BNP 646.0* 531.0* 934.0*    ProBNP (last 3 results)  Recent Labs  07/30/13 1523 02/25/14 2200  PROBNP 604.2* 1014.0*    CBG:  Recent Labs Lab 07/06/14 2126 07/07/14 0811 07/07/14 1124 07/07/14 1709 07/07/14 2129  GLUCAP 338* 256* 310* 155* 259*    Recent Results (from the past 240 hour(s))  MRSA PCR Screening     Status: None   Collection Time: 07/06/14  5:57 AM  Result Value Ref Range Status   MRSA by PCR NEGATIVE NEGATIVE Final    Comment:        The GeneXpert MRSA Assay (FDA approved for NASAL specimens only), is one component of a comprehensive MRSA colonization surveillance program. It is not intended to diagnose MRSA infection nor to guide or monitor treatment for MRSA infections.   Culture, blood (routine x 2) Call MD if unable to obtain prior to antibiotics being given     Status: None (Preliminary result)   Collection Time: 07/06/14 10:05 AM  Result Value Ref Range Status   Specimen Description BLOOD LEFT HAND  Final   Special Requests BOTTLES DRAWN AEROBIC ONLY 3CC  Final   Culture   Final    GRAM POSITIVE COCCI IN CLUSTERS Note: Gram Stain Report Called to,Read Back By and Verified With: LAUREN ON 2HC AT 1800 ON [id number] BY CASTC Performed at [facility]    Report Status PENDING  Incomplete  Culture, blood (routine x 2) Call MD if  unable to obtain prior to antibiotics being given     Status: None (Preliminary result)   Collection Time: 07/06/14  8:09 PM  Result Value Ref Range Status   Specimen Description BLOOD LEFT HAND  Final   Special Requests BOTTLES DRAWN AEROBIC ONLY 4CC  Final   Culture PENDING  Incomplete   Report Status PENDING  Incomplete     Studies: Dg Pelvis Portable  07/07/2014   CLINICAL DATA:  Subsequent evaluation left femur fracture status post surgery 1 week ago  EXAM: PORTABLE PELVIS 1-2 VIEWS  COMPARISON:  06/24/2014  FINDINGS: Three compression screws  placed through the left femoral neck. Left femoral neck fracture again identified, mildly angulated but not significantly displaced. Mild right hip arthritis. Pelvic bones intact.  IMPRESSION: Left femoral neck fracture status post ORIF   Electronically Signed   By: Esperanza Heir M.D.   On: 07/07/2014 10:49   Dg Chest Port 1 View  07/07/2014   CLINICAL DATA:  Chest pain short breath for several days  EXAM: PORTABLE CHEST - 1 VIEW  COMPARISON:  Radiograph 07/06/2014  FINDINGS: Sternotomy wires overlie normal cardiac silhouette. There is diffuse airspace disease not changed from prior. Interval introduction of right PICC line with tip in distal SVC. No pneumothorax  IMPRESSION: 1. Right PICC line in good position. 2. Pulmonary edema pattern not changed prior. 3. Small bilateral pleural effusions.   Electronically Signed   By: Genevive Bi M.D.   On: 07/07/2014 17:45   Dg Chest Port 1 View  07/06/2014   CLINICAL DATA:  Shortness of breath.  EXAM: PORTABLE CHEST - 1 VIEW  COMPARISON:  CT 26-Jul-2014. Chest x-ray 2014/07/26, 05/07/2014, 02/25/2014.  FINDINGS: Mediastinum hilar structures are stable. Prior CABG. Stable cardiomegaly. Persistent diffuse pulmonary interstitial prominence likely secondary to congestive heart failure pulmonary interstitial edema. Underlying chronic interstitial lung disease most likely present. Small bilateral effusions. No  pneumothorax.  IMPRESSION: 1. Prior CABG. Persistent cardiomegaly with diffuse bilateral pulmonary interstitial prominence and small pleural effusions consistent with persistent congestive heart failure. 2. Underlying chronic interstitial lung disease. Active pneumonitis cannot be excluded.   Electronically Signed   By: Maisie Fus  Register   On: 07/06/2014 08:07    Scheduled Meds: . antiseptic oral rinse  7 mL Mouth Rinse BID  . aspirin  325 mg Oral Daily  . atorvastatin  80 mg Oral q1800  . azithromycin  500 mg Intravenous Q24H  . budesonide  0.25 mg Nebulization BID  . carvedilol  3.125 mg Oral BID  . clopidogrel  75 mg Oral Daily  . digoxin  0.25 mg Oral Daily  . feeding supplement (GLUCERNA SHAKE)  237 mL Oral TID BM  . furosemide  20 mg Intravenous Q12H  . heparin subcutaneous  5,000 Units Subcutaneous 3 times per day  . hydrocortisone sod succinate (SOLU-CORTEF) inj  50 mg Intravenous Q6H  . insulin aspart  0-20 Units Subcutaneous TID WC  . insulin aspart  0-5 Units Subcutaneous QHS  . insulin detemir  20 Units Subcutaneous BID  . ipratropium  0.5 mg Nebulization Q6H  . isosorbide mononitrate  60 mg Oral Daily  . levalbuterol  0.63 mg Nebulization Q6H  . piperacillin-tazobactam (ZOSYN)  IV  3.375 g Intravenous Q8H  . polyethylene glycol  17 g Oral Daily  . sertraline  50 mg Oral Daily  . sodium chloride  10-40 mL Intracatheter Q12H  . sodium chloride  3 mL Intravenous Q12H  . vancomycin  1,000 mg Intravenous Q12H  . vitamin B-12  1,000 mcg Oral Daily   Continuous Infusions:     Marinda Elk  [facility] Pager [phone]. If 7PM-7AM, please contact night-coverage at [email or web address], password [facility] 07/08/2014, 7:21 AM  LOS: 3 days

## 2014-07-08 NOTE — Progress Notes (Signed)
PT Cancellation Note  Patient Details Name: Timothy DecJames L Trujillo MRN: [id number] DOB: 09-11-1950   Cancelled Treatment:    Reason Eval/Treat Not Completed: Medical issues which prohibited therapy (respiratory issues and requiring bipap).   [name] 07/08/2014, 9:14 AM  [name] PT [phone]

## 2014-07-08 NOTE — Progress Notes (Signed)
PULMONARY / CRITICAL CARE MEDICINE   Name: Timothy Trujillo MRN: [id number] DOB: [birth year]    ADMISSION DATE:  06/13/2014 CONSULTATION DATE:  3/30  REFERRING MD :  Feliz-ortiz ([facility])   CHIEF COMPLAINT:  Hypotension   INITIAL PRESENTATION: 64 yo male with hx DM, chronic sCHF, end-stage cardiomyopathy (EF 20%), severe O2 dependent COPD recent admission for hip fx with surgical repair on 3/19.  Was d/c and returned 3/29 with c/o SOB and chest tightness, lightheadedness.  Admitted by [facility] and treated for acute on chronic sCHF with diuresis.  On 3/30 pt developed worsening SOB, hypotension and PCCM consulted.   STUDIES:  CTA chest 3/29 >>> pulmonary edema with chronic interstitial changes  SIGNIFICANT EVENTS: 4/01 Add milrinone  SUBJECTIVE:  Feels bipap helps, but still short of breath  VITAL SIGNS: Temp:  [97.2 F (36.2 C)-97.7 F (36.5 C)] 97.2 F (36.2 C) (04/01 0344) Pulse Rate:  [33-116] 94 (04/01 0803) Resp:  [14-30] 23 (04/01 0803) BP: (91-130)/(39-97) 130/88 mmHg (04/01 0600) SpO2:  [63 %-100 %] 96 % (04/01 0803) FiO2 (%):  [65 %] 65 % (04/01 0803) Weight:  [239 lb 3.2 oz (108.5 kg)] 239 lb 3.2 oz (108.5 kg) (04/01 0500) HEMODYNAMICS: CVP:  [10 mmHg-14 mmHg] 10 mmHg VENTILATOR SETTINGS: Vent Mode:  [-]  FiO2 (%):  [65 %] 65 % INTAKE / OUTPUT:  Intake/Output Summary (Last 24 hours) at 07/08/14 1048 Last data filed at 07/08/14 0630  Gross per 24 hour  Intake   1050 ml  Output   2855 ml  Net  -1805 ml    PHYSICAL EXAMINATION: General:  Chronically ill appearing male, NAD  Neuro:  Awake, alert, gen weakness  HEENT:  Mm dry, pale, no JVD  Cardiovascular:  s1s2 rrr 90's, NSR  Lungs: resps even, mildly labored on [place], diminished throughout, no audible wheeze  Abdomen:  Round, soft, non tender, +bs  Musculoskeletal:  Warm and dry, no edema   LABS:  CBC  Recent Labs Lab 07/06/14 0436 07/07/14 0937 07/08/14 0600  WBC 12.8* 15.7* 16.4*  HGB 11.2* 10.2* 10.0*   HCT 35.0* 31.4* 30.5*  PLT 287 282 303   Coag's  Recent Labs Lab 06/15/2014 1644  INR 1.06   BMET  Recent Labs Lab 07/06/14 0436 07/07/14 0937 07/08/14 0600  NA 138 135 138  K 3.7 4.3 3.8  CL 99 97 99  CO2 27 28 32  BUN 16 18 17   CREATININE 0.96 1.02 0.78  GLUCOSE 203* 304* 197*   Electrolytes  Recent Labs Lab 06/11/2014 2050 07/06/14 0436 07/07/14 0937 07/08/14 0600  CALCIUM  --  8.3* 8.6 8.8  MG 1.1*  --   --  1.3*  PHOS  --   --   --  3.8   Sepsis Markers  Recent Labs Lab 07/06/14 1326 07/06/14 1328 07/07/14 0308 07/08/14 0600  LATICACIDVEN  --  1.8  --   --   PROCALCITON 0.14  --  <0.10 <0.10   ABG  Recent Labs Lab 07/06/14 1714  PHART 7.468*  PCO2ART 39.8  PO2ART 81.0   Liver Enzymes  Recent Labs Lab 07/06/14 0436  AST 13  ALT 9  ALKPHOS 99  BILITOT 0.6  ALBUMIN 3.2*   Cardiac Enzymes  Recent Labs Lab 07/06/14 1326 07/06/14 2009 07/07/14 0045  TROPONINI 0.15* 0.13* 0.10*   Glucose  Recent Labs Lab 07/06/14 2126 07/07/14 0811 07/07/14 1124 07/07/14 1709 07/07/14 2129 07/08/14 0751  GLUCAP 338* 256* 310* 155* 259* 195*  Imaging Dg Pelvis Portable  07/07/2014   CLINICAL DATA:  Subsequent evaluation left femur fracture status post surgery 1 week ago  EXAM: PORTABLE PELVIS 1-2 VIEWS  COMPARISON:  06/24/2014  FINDINGS: Three compression screws placed through the left femoral neck. Left femoral neck fracture again identified, mildly angulated but not significantly displaced. Mild right hip arthritis. Pelvic bones intact.  IMPRESSION: Left femoral neck fracture status post ORIF   Electronically Signed   By: Skipper Cliche M.D.   On: 07/07/2014 10:49   Dg Chest Port 1 View  07/07/2014   CLINICAL DATA:  Chest pain short breath for several days  EXAM: PORTABLE CHEST - 1 VIEW  COMPARISON:  Radiograph 07/06/2014  FINDINGS: Sternotomy wires overlie normal cardiac silhouette. There is diffuse airspace disease not changed from  prior. Interval introduction of right PICC line with tip in distal SVC. No pneumothorax  IMPRESSION: 1. Right PICC line in good position. 2. Pulmonary edema pattern not changed prior. 3. Small bilateral pleural effusions.   Electronically Signed   By: Suzy Bouchard M.D.   On: 07/07/2014 17:45     ASSESSMENT / PLAN:  PULMONARY A: Acute hypoxic respiratory failure 2nd to acute pulmonary edema >> CT chest shows chronic changes ?if he also has ILD. Reported hx of severe COPD. P:   Oxygen to keep SpO2 88 to 95% F/u CXR BiPAP D/c pulmicort Scheduled atrovent/xopenex Check ESR Change prednisone to solumedrol 4/01  CARDIOVASCULAR A: Acute on chronic systolic CHF >> baseline EF 20 to 25%. Hx of CAD s/p CABG. Cardiogenic shock. P:  Continue ASA, lipitor, plavix, digoxin, lasix Milrinone per cardiology  RENAL A: No acute issues. P:   Monitor renal fx  GASTROINTESTINAL A: Nutrition. P:   Begin heart healthy diet once off BiPAP protonix for SUP  HEMATOLOGIC A: Leukocytosis. P:  F/u CBC SQ heparin for DVT prophylaxis  INFECTIOUS A: Concern for HCAP >> less likely given low procalcitonin. Coag neg staph in blood cx from 3/30 ?real vs contaminate. P:   Change Abx to ancef 4/01 and repeat blood cx  Blood 3/30 >> coag neg staph  ENDOCRINE A: DM P:   SSI, lantus   NEUROLOGIC A: Deconditioning. P:   Supportive care   ORTHO  A: L Hip fx  P:  PT/OT when stable   Goals of care >> he was previously DNR with palliative care.  Family requested to change to full code.  CC time 40 minutes.  Chesley Mires, MD [facility] Pulmonary/Critical Care 07/08/2014, 10:58 AM Pager:  [phone] After 3pm call: [phone]

## 2014-07-08 NOTE — Consult Note (Signed)
   [facility]   07/08/2014  Timothy DecJames L Trujillo November 18, 1950 [id number]   [facility]. [facility] [place]. Came to see patient at bedside. However, he was resting on bipap. Made inpatient RNCM aware. Will continue to follow.   Raiford NobleAtika Hall, MSN-Ed, RN,BSN [facility] Hospital Liaison [phone]

## 2014-07-08 NOTE — Progress Notes (Signed)
Incisions c/d/i.  Healed. RN to remove staples. WBAT LLE F/u in office in 4 weeks.  Timothy ReelN. Michael Xu, MD [facility] [phone] 1:55 PM

## 2014-07-08 NOTE — Consult Note (Signed)
Referring Physician: Radonna Ricker Primary Cardiologist: Wyline Mood Reason for Consultation: Respiratory failure   HPI:  Ms. Solem is a 64 yo male with hx DM2, chronic systolic HF EF 20-25%,  severe O2 dependent COPD. Had recent admission for hip fx with surgical repair on 3/19. Was d/c and returned 3/29 with c/o SOB and chest tightness, lightheadedness. Admitted by [facility] and treated for acute on chronic sCHF with diuresis. Yesterday developed worsening SOB and hypotension. CTA chest 3/30: I personally reviewed, negative PE, diffuse infiltratessuspicious for pulmonary edema, bilat small effusions. Treated with lasix with minimal response.   This am developed respiratory failure and rapid AFIB at 170. Treated with diltiazem and started BIPAP. Became hypotensive. Sluggish reponse to IV lasix. CVP 13 Co-ox 33%  + cough. Non-productive. No CP currently. PCT negative. ECG with non-specific abnomalities.    Review of Systems:     Cardiac Review of Systems: {Y] = yes  = no  Chest Pain [    ]  Resting SOB [   ] Exertional SOB  Cove.Etienne  ]  Orthopnea [ y ]   Pedal Edema [   ]    Palpitations Cove.Etienne  ] Syncope  [  ]   Presyncope [   ]  General Review of Systems: [Y] = yes [  ]=no Constitional: recent weight change [  ]; anorexia [  ]; fatigue [  ]; nausea [  ]; night sweats [  ]; fever [  ]; or chills [  ];                                                        Eyes : blurred vision [  ]; diplopia [   ]; vision changes [  ];  Amaurosis fugax[  ]; Resp: cough [ y ];  wheezing[  ];  hemoptysis[  ];  PND [  ];  GI:  gallstones[  ], vomiting[  ];  dysphagia[  ]; melena[  ];  hematochezia [  ]; heartburn[  ];   GU: kidney stones [  ]; hematuria[  ];   dysuria [  ];  nocturia[  ]; incontinence [  ];             Skin: rash, swelling[  ];, hair loss[  ];  peripheral edema[  ];  or itching[  ]; Musculosketetal: myalgias[  ];  joint swelling[  ];  joint erythema[  ];  joint pain[ y ];  back pain[  ];  Heme/Lymph:  bruising[  ];  bleeding[  ];  anemia[  ];  Neuro: TIA[  ];  headaches[  ];  stroke[  ];  vertigo[  ];  seizures[  ];   paresthesias[  ];  difficulty walking[  ];  Psych:depression[  ]; anxiety[  ];  Endocrine: diabetes[ y ];  thyroid dysfunction[  ];  Other:  Past Medical History  Diagnosis Date  . Uncontrolled diabetes mellitus     a. A1C 12.8 in 01/2013.  [name] Chronic systolic heart failure   . Obesity   . Hyperlipidemia   . Peripheral vascular disease   . Hypertension   . Asthma   . CAD (coronary artery disease)     a. Prior CABG 20 yrs ago (?~1994). b. Hx of multiple stents, prev [place]. c.  [place]: cath moderate disease in LAD, distal LCx, prox RCA occluded, 2 SVGs occluded, felt to be stable from prior cath -> for medical therapy.  . Ischemic cardiomyopathy     a. EF 2012: 55-60. b. EF 12/2011: 25-30%. c. Echo 01/2013: EF 15-20%.  . Stroke     15 years ago  . Arthritis   . Anxiety   . Depression   . 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 Chronic respiratory failure     a. Due to COPD.  . Osteomyelitis 2013    was planned for chronic suppressive antibiotics but cannot afford this any longer  . CHF (congestive heart failure)   . Diabetes mellitus without complication     Medications Prior to Admission  Medication Sig Dispense Refill  . ALPRAZolam (XANAX) 0.25 MG tablet Take 1 tablet (0.25 mg total) by mouth 2 (two) times daily as needed for anxiety. 60 tablet 5  . aspirin 325 MG tablet Take 325 mg by mouth daily.    [name]. atorvastatin (LIPITOR) 80 MG tablet Take 1 tablet (80 mg total) by mouth daily. 90 tablet 3  . budesonide (PULMICORT) 0.25 MG/2ML nebulizer solution Take 2 mLs (0.25 mg total) by nebulization 2 (two) times daily. 60 mL 12  . carvedilol (COREG) 6.25 MG tablet Take 1 tablet (6.25 mg total) by mouth 2 (two) times daily. 180 tablet 3  . clopidogrel (PLAVIX) 75 MG tablet Take 1 tablet (75 mg total)  by mouth daily. 90 tablet 3  . digoxin (LANOXIN) 0.25 MG tablet Take 1 tablet (0.25 mg total) by mouth daily. 30 tablet 12  . insulin NPH Human (HUMULIN N,NOVOLIN N) 100 UNIT/ML injection Inject 0.4 mLs (40 Units total) into the skin daily before breakfast. (Patient taking differently: Inject 110 Units into the skin 2 (two) times daily. ) 10 mL 11  . isosorbide mononitrate (IMDUR) 60 MG 24 hr tablet Take 1 tablet (60 mg total) by mouth daily. 90 tablet 3  . nitroGLYCERIN (NITROSTAT) 0.4 MG SL tablet Place 1 tablet (0.4 mg total) under the tongue every 5 (five) minutes as needed for chest pain. 25 tablet 3  . Nutritional Supplements (ENSURE LIGHT PO) Take 1 Can by mouth 2 (two) times daily.    . ondansetron (ZOFRAN) 4 MG tablet Take 1 tablet (4 mg total) by mouth every 6 (six) hours as needed for nausea. 20 tablet 0  . oxyCODONE-acetaminophen (PERCOCET) 10-325 MG per tablet Take 0.5-1 tablets by mouth every 4 (four) hours as needed for pain.     . polyethylene glycol (MIRALAX / GLYCOLAX) packet Take 17 g by mouth daily. 14 each 0  . potassium chloride SA (K-DUR,KLOR-CON) 20 MEQ tablet Take 1 tablet (20 mEq total) by mouth 2 (two) times daily.    . sertraline (ZOLOFT) 50 MG tablet Take 1 tablet (50 mg total) by mouth daily. 30 tablet 12  . torsemide (DEMADEX) 20 MG tablet Take 1 tablet (20 mg total) by mouth 2 (two) times daily.    . vitamin B-12 (CYANOCOBALAMIN) 1000 MCG tablet Take 1,000 mcg by mouth daily.    . vitamin C (ASCORBIC ACID) 500 MG tablet Take 1,000 mg by mouth at bedtime.    . vitamin E 400 UNIT capsule Take 400 Units by mouth at bedtime.     [name]. acetaminophen (TYLENOL) 325 MG tablet Take 2 tablets (650 mg total) by mouth every 6 (six) hours as  needed for mild pain (or Fever >/= 101).    . diphenhydrAMINE (BENADRYL) 50 MG tablet Take 1 tablet (50 mg total) by mouth every 6 (six) hours as needed for itching. 120 tablet 5  . feeding supplement, ENSURE COMPLETE, (ENSURE COMPLETE) LIQD Take  237 mLs by mouth 2 (two) times daily between meals. (Patient not taking: Reported on 06/09/2014)    . hydrocortisone 2.5 % cream Apply 1 application topically daily as needed (itching).     [name] levalbuterol (XOPENEX) 0.63 MG/3ML nebulizer solution Take 3 mLs (0.63 mg total) by nebulization every 6 (six) hours as needed for wheezing or shortness of breath. 3 mL 12     . amiodarone  150 mg Intravenous Once  . antiseptic oral rinse  7 mL Mouth Rinse BID  . aspirin  325 mg Oral Daily  . atorvastatin  80 mg Oral q1800  . budesonide  0.25 mg Nebulization BID  . clopidogrel  75 mg Oral Daily  . digoxin  0.125 mg Oral Daily  . feeding supplement (GLUCERNA SHAKE)  237 mL Oral TID BM  . furosemide  40 mg Intravenous Once  . heparin subcutaneous  5,000 Units Subcutaneous 3 times per day  . insulin aspart  0-20 Units Subcutaneous TID WC  . insulin aspart  0-5 Units Subcutaneous QHS  . insulin detemir  20 Units Subcutaneous BID  . ipratropium  0.5 mg Nebulization Q6H  . isosorbide mononitrate  60 mg Oral Daily  . levalbuterol  0.63 mg Nebulization Q6H  . magnesium sulfate 1 - 4 g bolus IVPB  6 g Intravenous Once  . polyethylene glycol  17 g Oral Daily  . predniSONE  10 mg Oral Q breakfast  . sertraline  50 mg Oral Daily  . sodium chloride  10-40 mL Intracatheter Q12H  . sodium chloride  3 mL Intravenous Q12H  . vancomycin  1,000 mg Intravenous Q12H  . vitamin B-12  1,000 mcg Oral Daily    Infusions: . amiodarone 60 mg/hr (07/08/14 0819)   Followed by  . amiodarone    . milrinone    . norepinephrine (LEVOPHED) Adult infusion      Allergies  Allergen Reactions  . Baycol [Cerivastatin] Other (See Comments)    Patient states it "about killed me" kidney shut down. Black stools.  . Stadol [Butorphanol Tartrate] Other (See Comments)    "makes him crazy"    History   Social History  . Marital Status: Widowed    Spouse Name: N/A  . Number of Children: N/A  . Years of Education: N/A    Occupational History  . Not on file.   Social History Main Topics  . Smoking status: Former Smoker -- 3.00 packs/day for 26 years    Start date: 04/08/1957    Quit date: 04/09/1983  . Smokeless tobacco: Never Used  . Alcohol Use: No  . Drug Use: No  . Sexual Activity: No   Other Topics Concern  . Not on file   Social History Narrative   Pt lives in [place] alone.   Disabled   Previously worked as a Naval architect    Family History  Problem Relation Age of Onset  . Dementia Mother   . Arthritis Mother   . Heart disease Father   . Heart disease Brother     PHYSICAL EXAM: Filed Vitals:   07/08/14 0803  BP:   Pulse: 94  Temp:   Resp: 23     Intake/Output Summary (Last 24  hours) at 07/08/14 0823 Last data filed at 07/08/14 0630  Gross per 24 hour  Intake   1270 ml  Output   2915 ml  Net  -1645 ml    General:  Lying in bed on BIPAP. + tachyneic HEENT: normal. BIPAP in place Neck: supple.JVP to jaw Carotids 2+ bilat; no bruits. No lymphadenopathy or thryomegaly appreciated. Cor: PMI laterally displaced. Tachy regular Lungs: diffuse crackles Abdomen: soft, nontender, nondistended. No hepatosplenomegaly. No bruits or masses. Good bowel sounds. Extremities: no cyanosis, clubbing, rash, edema + cool Neuro: alert & oriented x 3, cranial nerves grossly intact. moves all 4 extremities w/o difficulty. Affect pleasant.  ECG: ST 100 .non specific ST-T wave changes   Results for orders placed or performed during the hospital encounter of 06/22/2014 (from the past 24 hour(s))  CBC     Status: Abnormal   Collection Time: 07/07/14  9:37 AM  Result Value Ref Range   WBC 15.7 (H) 4.0 - 10.5 K/uL   RBC 3.50 (L) 4.22 - 5.81 MIL/uL   Hemoglobin 10.2 (L) 13.0 - 17.0 g/dL   HCT 40.9 (L) 81.1 - 91.4 %   MCV 89.7 78.0 - 100.0 fL   MCH 29.1 26.0 - 34.0 pg   MCHC 32.5 30.0 - 36.0 g/dL   RDW 78.2 95.6 - 21.3 %   Platelets 282 150 - 400 K/uL  Basic metabolic panel     Status:  Abnormal   Collection Time: 07/07/14  9:37 AM  Result Value Ref Range   Sodium 135 135 - 145 mmol/L   Potassium 4.3 3.5 - 5.1 mmol/L   Chloride 97 96 - 112 mmol/L   CO2 28 19 - 32 mmol/L   Glucose, Bld 304 (H) 70 - 99 mg/dL   BUN 18 6 - 23 mg/dL   Creatinine, Ser 0.86 0.50 - 1.35 mg/dL   Calcium 8.6 8.4 - 57.8 mg/dL   GFR calc non Af Amer 75 (L) >90 mL/min   GFR calc Af Amer 87 (L) >90 mL/min   Anion gap 10 5 - 15  Glucose, capillary     Status: Abnormal   Collection Time: 07/07/14 11:24 AM  Result Value Ref Range   Glucose-Capillary 310 (H) 70 - 99 mg/dL   Comment 1 Capillary Specimen   Glucose, capillary     Status: Abnormal   Collection Time: 07/07/14  5:09 PM  Result Value Ref Range   Glucose-Capillary 155 (H) 70 - 99 mg/dL  Glucose, capillary     Status: Abnormal   Collection Time: 07/07/14  9:29 PM  Result Value Ref Range   Glucose-Capillary 259 (H) 70 - 99 mg/dL   Comment 1 Capillary Specimen   CBC     Status: Abnormal   Collection Time: 07/08/14  6:00 AM  Result Value Ref Range   WBC 16.4 (H) 4.0 - 10.5 K/uL   RBC 3.45 (L) 4.22 - 5.81 MIL/uL   Hemoglobin 10.0 (L) 13.0 - 17.0 g/dL   HCT 46.9 (L) 62.9 - 52.8 %   MCV 88.4 78.0 - 100.0 fL   MCH 29.0 26.0 - 34.0 pg   MCHC 32.8 30.0 - 36.0 g/dL   RDW 41.3 24.4 - 01.0 %   Platelets 303 150 - 400 K/uL  Basic metabolic panel     Status: Abnormal   Collection Time: 07/08/14  6:00 AM  Result Value Ref Range   Sodium 138 135 - 145 mmol/L   Potassium 3.8 3.5 - 5.1 mmol/L  Chloride 99 96 - 112 mmol/L   CO2 32 19 - 32 mmol/L   Glucose, Bld 197 (H) 70 - 99 mg/dL   BUN 17 6 - 23 mg/dL   Creatinine, Ser 1.61 0.50 - 1.35 mg/dL   Calcium 8.8 8.4 - 09.6 mg/dL   GFR calc non Af Amer >90 >90 mL/min   GFR calc Af Amer >90 >90 mL/min   Anion gap 7 5 - 15  Magnesium     Status: Abnormal   Collection Time: 07/08/14  6:00 AM  Result Value Ref Range   Magnesium 1.3 (L) 1.5 - 2.5 mg/dL  Phosphorus     Status: None   Collection  Time: 07/08/14  6:00 AM  Result Value Ref Range   Phosphorus 3.8 2.3 - 4.6 mg/dL  Carboxyhemoglobin     Status: Abnormal   Collection Time: 07/08/14  7:55 AM  Result Value Ref Range   Total hemoglobin 10.8 (L) 13.5 - 18.0 g/dL   O2 Saturation 04.5 %   Carboxyhemoglobin 1.0 0.5 - 1.5 %   Methemoglobin 0.9 0.0 - 1.5 %   Dg Chest 1 View  07/08/2014   CLINICAL DATA:  Dyspnea and nonproductive cough. History of COPD, CHF, diabetes, and hypertension.  EXAM: CHEST  1 VIEW  COMPARISON:  07/07/2014  FINDINGS: Right PICC remains in place with tip projecting over the lower SVC. Sequelae of prior CABG are again identified. The cardiac silhouette remains upper limits of normal to mildly enlarged in size. Diffuse bilateral lung opacities and small bilateral pleural effusions do not appear significantly changed. No pneumothorax is identified.  IMPRESSION: Pulmonary edema and small bilateral pleural effusions, unchanged.   Electronically Signed   By: Sebastian Ache   On: 07/08/2014 08:11   Dg Pelvis Portable  07/07/2014   CLINICAL DATA:  Subsequent evaluation left femur fracture status post surgery 1 week ago  EXAM: PORTABLE PELVIS 1-2 VIEWS  COMPARISON:  06/24/2014  FINDINGS: Three compression screws placed through the left femoral neck. Left femoral neck fracture again identified, mildly angulated but not significantly displaced. Mild right hip arthritis. Pelvic bones intact.  IMPRESSION: Left femoral neck fracture status post ORIF   Electronically Signed   By: Esperanza Heir M.D.   On: 07/07/2014 10:49   Dg Chest Port 1 View  07/07/2014   CLINICAL DATA:  Chest pain short breath for several days  EXAM: PORTABLE CHEST - 1 VIEW  COMPARISON:  Radiograph 07/06/2014  FINDINGS: Sternotomy wires overlie normal cardiac silhouette. There is diffuse airspace disease not changed from prior. Interval introduction of right PICC line with tip in distal SVC. No pneumothorax  IMPRESSION: 1. Right PICC line in good position. 2.  Pulmonary edema pattern not changed prior. 3. Small bilateral pleural effusions.   Electronically Signed   By: Genevive Bi M.D.   On: 07/07/2014 17:45     ASSESSMENT: 1. Acute on chronic respiratory failure 2. Cardiogenic shock 3. Acute on chronic systolic CM 4. Ischemic CM EF 20-25% 5. Severe COPD on home O2 6. CAD s/p CABG   Last cath 10/14 LM 60%, LAD 60% mid, LCX prox patent stent with distal occluded LCX, RCA occluded. SVG-RCA occluded, SVG-OM occluded. No LIMA. 7. Recent hip replacement 8. Paroxysmal AF 9. BCx 1/2 GPC  PLAN/DISCUSSION:  His CXR and CT scan were both reviewed personally - they show severe pulmonary edema with a co-ox 33% consistent with cardiogenic shock. He is now on BiPAP. CVP 13. He is responding sluggishly to lasix.  Will start milrinone. If develops recurrent hypotension can use levophed for BP support. Had transient bout of AF in setting of severe CHF. Will follow with CVPs and co-ox. Hold carvedilol for now. Continue digoxin.   Would start amio for now and if no further recurrences can stop prior to d/c. Would not anticoagulate unless he has recurrent AF.  No clear evidence of PNA. BCX 1/2 GPC. Will cover with vancomycin until speciated.   HF team will follow.  The patient is critically ill with multiple organ systems failure and requires high complexity decision making for assessment and support, frequent evaluation and titration of therapies, application of advanced monitoring technologies and extensive interpretation of multiple databases.   Critical Care Time devoted to patient care services described in this note is 45 Minutes.   Daniel Bensimhon,MD 8:30 AM

## 2014-07-08 DEATH — deceased

## 2014-07-09 ENCOUNTER — Inpatient Hospital Stay (HOSPITAL_COMMUNITY): Payer: Commercial Managed Care - HMO

## 2014-07-09 DIAGNOSIS — J9600 Acute respiratory failure, unspecified whether with hypoxia or hypercapnia: Secondary | ICD-10-CM

## 2014-07-09 DIAGNOSIS — J849 Interstitial pulmonary disease, unspecified: Secondary | ICD-10-CM | POA: Insufficient documentation

## 2014-07-09 DIAGNOSIS — I509 Heart failure, unspecified: Secondary | ICD-10-CM

## 2014-07-09 DIAGNOSIS — R570 Cardiogenic shock: Secondary | ICD-10-CM | POA: Diagnosis present

## 2014-07-09 LAB — GLUCOSE, CAPILLARY
GLUCOSE-CAPILLARY: 289 mg/dL — AB (ref 70–99)
GLUCOSE-CAPILLARY: 339 mg/dL — AB (ref 70–99)
Glucose-Capillary: 249 mg/dL — ABNORMAL HIGH (ref 70–99)
Glucose-Capillary: 245 mg/dL — ABNORMAL HIGH (ref 70–99)
Glucose-Capillary: 309 mg/dL — ABNORMAL HIGH (ref 70–99)
Glucose-Capillary: 275 mg/dL — ABNORMAL HIGH (ref 70–99)

## 2014-07-09 LAB — CBC
HEMATOCRIT: 27.7 % — AB (ref 39.0–52.0)
Hemoglobin: 9.1 g/dL — ABNORMAL LOW (ref 13.0–17.0)
MCH: 28.7 pg (ref 26.0–34.0)
MCHC: 32.9 g/dL (ref 30.0–36.0)
MCV: 87.4 fL (ref 78.0–100.0)
PLATELETS: 264 10*3/uL (ref 150–400)
RBC: 3.17 MIL/uL — AB (ref 4.22–5.81)
RDW: 14.8 % (ref 11.5–15.5)
WBC: 12.1 10*3/uL — AB (ref 4.0–10.5)

## 2014-07-09 LAB — BASIC METABOLIC PANEL
ANION GAP: 5 (ref 5–15)
BUN: 21 mg/dL (ref 6–23)
CALCIUM: 8.4 mg/dL (ref 8.4–10.5)
CO2: 35 mmol/L — ABNORMAL HIGH (ref 19–32)
Chloride: 94 mmol/L — ABNORMAL LOW (ref 96–112)
Creatinine, Ser: 0.91 mg/dL (ref 0.50–1.35)
GFR calc non Af Amer: 87 mL/min — ABNORMAL LOW (ref >90)
GLUCOSE: 291 mg/dL — AB (ref 70–99)
POTASSIUM: 3.9 mmol/L (ref 3.5–5.1)
Sodium: 134 mmol/L — ABNORMAL LOW (ref 135–145)

## 2014-07-09 LAB — CARBOXYHEMOGLOBIN
CARBOXYHEMOGLOBIN: 0.9 % (ref 0.5–1.5)
Methemoglobin: 0.8 % (ref 0.0–1.5)
O2 SAT: 69.3 %
Total hemoglobin: 9.3 g/dL — ABNORMAL LOW (ref 13.5–18.0)

## 2014-07-09 LAB — MAGNESIUM: MAGNESIUM: 2 mg/dL (ref 1.5–2.5)

## 2014-07-09 MED ORDER — LEVALBUTEROL HCL 0.63 MG/3ML IN NEBU
0.6300 mg | INHALATION_SOLUTION | RESPIRATORY_TRACT | Status: DC
  Administered 2014-07-09: 0.63 mg via RESPIRATORY_TRACT
  Filled 2014-07-09: qty 3

## 2014-07-09 MED ORDER — IPRATROPIUM BROMIDE 0.02 % IN SOLN
0.5000 mg | Freq: Four times a day (QID) | RESPIRATORY_TRACT | Status: DC
  Administered 2014-07-09 – 2014-07-11 (×7): 0.5 mg via RESPIRATORY_TRACT
  Filled 2014-07-09 (×7): qty 2.5

## 2014-07-09 MED ORDER — LEVALBUTEROL HCL 0.63 MG/3ML IN NEBU
0.6300 mg | INHALATION_SOLUTION | Freq: Four times a day (QID) | RESPIRATORY_TRACT | Status: DC
  Administered 2014-07-09 – 2014-07-11 (×7): 0.63 mg via RESPIRATORY_TRACT
  Filled 2014-07-09 (×15): qty 3

## 2014-07-09 MED ORDER — DEXTROSE 5 % IV SOLN
4.0000 mg/h | INTRAVENOUS | Status: DC
  Administered 2014-07-09: 4 mg/h via INTRAVENOUS
  Filled 2014-07-09: qty 25

## 2014-07-09 MED ORDER — IPRATROPIUM BROMIDE 0.02 % IN SOLN
0.5000 mg | RESPIRATORY_TRACT | Status: DC
  Administered 2014-07-09: 0.5 mg via RESPIRATORY_TRACT
  Filled 2014-07-09: qty 2.5

## 2014-07-09 NOTE — Progress Notes (Addendum)
[facility] PROGRESS NOTE Interim History: 64 y.o. male with ischemic cardiomyopathy with an EF of 20-25%, status post CABG, severe COPD oxygen dependent diabetes mellitus type 2 comes in with chest pain Chest pain. Started around 12:00 Described as chest tightness. Associated with shortness of breath, lightheaded, chilled and w/ radiation to the neck. SOB for the previous 2 days. Home O2 as low as 71% and very irregular heart beat noted at home. Admit to SDU> on IV heparin, IV lasix and nitropaste. Became hypotensive on 3.30.2016 stopped coreg, lasix and imdur, gave bolus of NS with persistent hypotension consult PCCM started broad-spectrum antibiotics history is dose steroids. Now Bp improved. Patient's went into A. fib with RVR in the 200s on 07/08/2014 with hypoxia was given IV Lasix, nitroglycerin and placed on BiPAP. His hypoxia improved. Now in SR. consulted cards  started on Amio and milrinone as CO-ox low and CVP high, BP & HR improved. Filed Weights   07/07/14 0500 07/08/14 0500 07/09/14 0413  Weight: 109.4 kg (241 lb 2.9 oz) 108.5 kg (239 lb 3.2 oz) 107.5 kg (236 lb 15.9 oz)        Intake/Output Summary (Last 24 hours) at 07/09/14 0714 Last data filed at 07/09/14 0600  Gross per 24 hour  Intake 1726.69 ml  Output   3065 ml  Net -1338.31 ml     Assessment/Plan: Acute on chronic c respiratory failure with hypoxia due Acute systolic heart failure: - CVP was  co-ox 9. Responded well to lasix bolus and milrinone. I have personally reviewed CXR still shows pulmonary edema. - Pro-calcitonin low. Blood cultures 1/2 staph coagulase negative probably a contaminate d/c all antibiotics. - Appreciate cardiology assistance.  - good  Diuresis. Start lasix gtt after d/w cards, D/c steroids.  Afib with RVR: - Switch to Amio now in SR. - Likely due to vol overload. - Appriciate cardiology.  Elevated troponins: - markers trended down.  Depression with anxiety: - Continue Zoloft  and Xanax.  History of Femoral neck fracture: - PT OT once better pulmonary wise.  Diabetes mellitus type 2, uncontrolled: - Poorly controlled, increased Levemir twice a day continue sliding scale insulin. - bg sugar high due to stress dose.   Essential  Hypertension/hypotension: - Hypotension resolved with holding antihypertensive and with Fluid bolus. - Multifactorial due to infection, medications and relative adrenal insufficiency.  Sinus tachycardia: - Beta blocker held on 3.30.2016 due to hypotensive episode.   Code Status: full Family Communication: none  Disposition Plan: inpatient   Consultants:  cardiology  Procedures: ECHO: none  Antibiotics:  vanc and cefepime 3.30.2016  HPI/Subjective: Relates SOB is improved.  Objective: Filed Vitals:   07/09/14 0100 07/09/14 0119 07/09/14 0200 07/09/14 0413  BP: 110/72  120/80   Pulse: 90  87   Temp:    97.1 F (36.2 C)  TempSrc:    Oral  Resp: 10  18   Height:      Weight:    107.5 kg (236 lb 15.9 oz)  SpO2: 100% 100% 100%      Exam:  General: Alert, awake, oriented x3, in no acute distress.  HEENT: No bruits, no goiter.  Heart: Regular rate and rhythm, no edema. Lungs: Good air movement, crackles on his right lower lobe Abdomen: Soft, nontender, nondistended, positive bowel sounds.    Data Reviewed: Basic Metabolic Panel:  Recent Labs Lab 10/19/14 2050 07/06/14 0436 07/07/14 0937 07/08/14 0600 07/08/14 2000 07/09/14 0340  NA  --  138 135 138 134*  134*  K  --  3.7 4.3 3.8 4.0 3.9  CL  --  99 97 99 94* 94*  CO2  --  27 28 32 29 35*  GLUCOSE  --  203* 304* 197* 389* 291*  BUN  --  CREATININE  --  0.96 1.02 0.78 0.95 0.91  CALCIUM  --  8.3* 8.6 8.8 8.4 8.4  MG 1.1*  --   --  1.3*  --  2.0  PHOS  --   --   --  3.8  --   --    Liver Function Tests:  Recent Labs Lab 07/06/14 0436  AST 13  ALT 9  ALKPHOS 99  BILITOT 0.6  PROT 6.8  ALBUMIN 3.2*   No results for  input(s): LIPASE, AMYLASE in the last 168 hours. No results for input(s): AMMONIA in the last 168 hours. CBC:  Recent Labs Lab 06/27/2014 1644 07/06/14 0436 07/07/14 0937 07/08/14 0600 07/09/14 0340  WBC 12.5* 12.8* 15.7* 16.4* 12.1*  NEUTROABS 10.3*  --   --   --   --   HGB 10.3* 11.2* 10.2* 10.0* 9.1*  HCT 31.8* 35.0* 31.4* 30.5* 27.7*  MCV 89.1 90.2 89.7 88.4 87.4  PLT 269 287 282 303 264   Cardiac Enzymes:  Recent Labs Lab 07/06/14 0215 07/06/14 0436 07/06/14 1326 07/06/14 2009 07/07/14 0045  TROPONINI 0.15* 0.17* 0.15* 0.13* 0.10*   BNP (last 3 results)  Recent Labs  05/06/14 2355 06/04/14 0325 07/07/2014 1644  BNP 646.0* 531.0* 934.0*    ProBNP (last 3 results)  Recent Labs  07/30/13 1523 02/25/14 2200  PROBNP 604.2* 1014.0*    CBG:  Recent Labs Lab 07/08/14 1239 07/08/14 1548 07/08/14 2014 07/08/14 2349 07/09/14 0411  GLUCAP 218* 230* 356* 289* 249*    Recent Results (from the past 240 hour(s))  MRSA PCR Screening     Status: None   Collection Time: 07/06/14  5:57 AM  Result Value Ref Range Status   MRSA by PCR NEGATIVE NEGATIVE Final    Comment:        The GeneXpert MRSA Assay (FDA approved for NASAL specimens only), is one component of a comprehensive MRSA colonization surveillance program. It is not intended to diagnose MRSA infection nor to guide or monitor treatment for MRSA infections.   Culture, blood (routine x 2) Call MD if unable to obtain prior to antibiotics being given     Status: None   Collection Time: 07/06/14 10:05 AM  Result Value Ref Range Status   Specimen Description BLOOD LEFT HAND  Final   Special Requests BOTTLES DRAWN AEROBIC ONLY 3CC  Final   Culture   Final    STAPHYLOCOCCUS SPECIES (COAGULASE NEGATIVE) Note: THE SIGNIFICANCE OF ISOLATING THIS ORGANISM FROM A SINGLE SET OF BLOOD CULTURES WHEN MULTIPLE SETS ARE DRAWN IS UNCERTAIN. PLEASE NOTIFY THE MICROBIOLOGY DEPARTMENT WITHIN ONE WEEK IF SPECIATION AND  SENSITIVITIES ARE REQUIRED. Note: Gram Stain Report Called to,Read Back By and Verified With: LAUREN ON 2HC AT 1800 ON [id number] BY CASTC Performed at [facility]    Report Status 07/08/2014 FINAL  Final  Culture, blood (routine x 2) Call MD if unable to obtain prior to antibiotics being given     Status: None (Preliminary result)   Collection Time: 07/06/14  8:09 PM  Result Value Ref Range Status   Specimen Description BLOOD LEFT HAND  Final   Special Requests BOTTLES DRAWN AEROBIC ONLY 4CC  Final  Culture   Final           BLOOD CULTURE RECEIVED NO GROWTH TO DATE CULTURE WILL BE HELD FOR 5 DAYS BEFORE ISSUING A FINAL NEGATIVE REPORT Performed at [facility]    Report Status PENDING  Incomplete     Studies: Dg Chest 1 View  07/08/2014   CLINICAL DATA:  Dyspnea and nonproductive cough. History of COPD, CHF, diabetes, and hypertension.  EXAM: CHEST  1 VIEW  COMPARISON:  07/07/2014  FINDINGS: Right PICC remains in place with tip projecting over the lower SVC. Sequelae of prior CABG are again identified. The cardiac silhouette remains upper limits of normal to mildly enlarged in size. Diffuse bilateral lung opacities and small bilateral pleural effusions do not appear significantly changed. No pneumothorax is identified.  IMPRESSION: Pulmonary edema and small bilateral pleural effusions, unchanged.   Electronically Signed   By: Sebastian Ache   On: 07/08/2014 08:11   Dg Pelvis Portable  07/07/2014   CLINICAL DATA:  Subsequent evaluation left femur fracture status post surgery 1 week ago  EXAM: PORTABLE PELVIS 1-2 VIEWS  COMPARISON:  06/24/2014  FINDINGS: Three compression screws placed through the left femoral neck. Left femoral neck fracture again identified, mildly angulated but not significantly displaced. Mild right hip arthritis. Pelvic bones intact.  IMPRESSION: Left femoral neck fracture status post ORIF   Electronically Signed   By: Esperanza Heir M.D.   On: 07/07/2014  10:49   Dg Chest Port 1 View  07/07/2014   CLINICAL DATA:  Chest pain short breath for several days  EXAM: PORTABLE CHEST - 1 VIEW  COMPARISON:  Radiograph 07/06/2014  FINDINGS: Sternotomy wires overlie normal cardiac silhouette. There is diffuse airspace disease not changed from prior. Interval introduction of right PICC line with tip in distal SVC. No pneumothorax  IMPRESSION: 1. Right PICC line in good position. 2. Pulmonary edema pattern not changed prior. 3. Small bilateral pleural effusions.   Electronically Signed   By: Genevive Bi M.D.   On: 07/07/2014 17:45    Scheduled Meds: . antiseptic oral rinse  7 mL Mouth Rinse q12n4p  . aspirin  325 mg Oral Daily  . atorvastatin  80 mg Oral q1800  .  ceFAZolin (ANCEF) IV  2 g Intravenous 3 times per day  . chlorhexidine  15 mL Mouth Rinse BID  . clopidogrel  75 mg Oral Daily  . digoxin  0.125 mg Oral Daily  . feeding supplement (GLUCERNA SHAKE)  237 mL Oral TID BM  . heparin subcutaneous  5,000 Units Subcutaneous 3 times per day  . insulin aspart  0-9 Units Subcutaneous 6 times per day  . insulin detemir  20 Units Subcutaneous BID  . ipratropium  0.5 mg Nebulization Q6H WA  . isosorbide mononitrate  60 mg Oral Daily  . levalbuterol  0.63 mg Nebulization Q6H WA  . methylPREDNISolone (SOLU-MEDROL) injection  40 mg Intravenous Q6H  . polyethylene glycol  17 g Oral Daily  . sertraline  50 mg Oral Daily  . sodium chloride  10-40 mL Intracatheter Q12H  . sodium chloride  3 mL Intravenous Q12H  . vitamin B-12  1,000 mcg Oral Daily   Continuous Infusions: . amiodarone 30 mg/hr (07/09/14 0547)  . milrinone 0.125 mcg/kg/min (07/09/14 0149)  . norepinephrine (LEVOPHED) Adult infusion       Marinda Elk  [facility] Pager [phone]. If 7PM-7AM, please contact night-coverage at [email or web address], password [facility] 07/09/2014, 7:14 AM  LOS: 4 days

## 2014-07-09 NOTE — Evaluation (Signed)
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 Was d/c and returned 3/29 with c/o SOB and chest tightness, lightheadedness. Admitted by [facility] and treated for acute on chronic sCHF with diuresis.  Clinical Impression  Pt admitted with the above complications. Pt currently with functional limitations due to the deficits listed below (see PT Problem List). Tolerated transfer training well with 15L supplemental O2 on NRB mask this AM. SpO2 maintained mid 90s throughout with a very brief drop to 89%. HR to 110 bpm. Fatigues quickly. He lives alone and has very limited support. Pt will benefit from skilled PT to increase their independence and safety with mobility to allow discharge to the venue listed below.       Follow Up Recommendations SNF;Supervision for mobility/OOB    Equipment Recommendations  None recommended by PT    Recommendations for Other Services OT consult     Precautions / Restrictions Precautions Precautions: Fall Precaution Comments: monitor O2 sats Restrictions Weight Bearing Restrictions: Yes LLE Weight Bearing: Weight bearing as tolerated      Mobility  Bed Mobility Overal bed mobility: Modified Independent             General bed mobility comments: Heavy use of rail  Transfers Overall transfer level: Needs assistance Equipment used: Rolling walker (2 wheeled) Transfers: Sit to/from UGI CorporationStand;Stand Pivot Transfers Sit to Stand: Min guard Stand pivot transfers: Min guard       General transfer comment: Min guard for safety. VC for hand placement. Slow to rise. Performed from lowest bed setting. Good control of RW with pivot transfer to chair; no buckling noted.  Ambulation/Gait                 Stairs            Wheelchair Mobility    Modified Rankin (Stroke Patients Only)       Balance Overall balance assessment: Needs assistance Sitting-balance support: No upper extremity supported;Feet supported Sitting balance-Leahy Scale: Good     Standing balance support: No upper extremity supported;During functional activity Standing balance-Leahy Scale: Fair                               Pertinent Vitals/Pain Pain Assessment: 0-10 Pain Score: 5  Pain Location: lower back- has hurt since 64 y.o. Pain Descriptors / Indicators: Aching Pain Intervention(s): Monitored during session;Repositioned    Home Living Family/patient expects to be discharged to:: Private residence Living Arrangements: Alone;Children (grandson stays with pt some times) Available Help at Discharge: Family (grandson checks on pt in AM and after school in PM) Type of Home: House Home Access: Ramped entrance     Home Layout: One level Home Equipment: Bedside commode;Walker - 2 wheels;Wheelchair - power;Wheelchair - manual;Hand held shower head;Tub bench (home O2) Additional Comments: Pt's grandson comes by to check on him 2x/day and stays every weekend, Friday-Sunday. HHRN 1x/week.  Wears 3L O2 at rest, 4L supplemental O2 with activity.    Prior Function Level of Independence: Independent with assistive device(s)   Gait / Transfers Assistance Needed: has been ambulating with RW in home. Occasionally uses power w/c           Hand Dominance   Dominant  Hand: Right    Extremity/Trunk Assessment   Upper Extremity Assessment: Defer to OT evaluation           Lower Extremity Assessment: Generalized weakness         Communication   Communication: No difficulties  Cognition Arousal/Alertness: Awake/alert Behavior During Therapy: WFL for tasks assessed/performed Overall Cognitive Status: Within Functional Limits for tasks assessed                       General Comments      Exercises General Exercises - Lower Extremity Ankle Circles/Pumps: AROM;Both;10 reps;Supine Quad Sets: Strengthening;Both;10 reps;Supine Hip Flexion/Marching: Strengthening;Both;10 reps;Standing      Assessment/Plan    PT Assessment Patient needs continued PT services  PT Diagnosis Difficulty walking;Generalized weakness   PT Problem List Decreased strength;Decreased activity tolerance;Decreased balance;Decreased mobility;Decreased knowledge of use of DME;Cardiopulmonary status limiting activity;Pain;Decreased range of motion  PT Treatment Interventions DME instruction;Gait training;Functional mobility training;Therapeutic activities;Therapeutic exercise;Balance training;Neuromuscular re-education;Patient/family education;Modalities   PT Goals (Current goals can be found in the Care Plan section) Acute Rehab PT Goals Patient Stated Goal: Be able to breath better PT Goal Formulation: With patient Time For Goal Achievement: 07/23/14 Potential to Achieve Goals: Fair    Frequency Min 3X/week   Barriers to discharge Decreased caregiver support lives alone    Co-evaluation               End of Session Equipment Utilized During Treatment: Gait belt;Oxygen (15 [place] NRB mask) Activity Tolerance: Patient limited by fatigue Patient left: in chair;with call bell/phone within reach;with nursing/sitter in room Nurse Communication: Mobility status         Time: 1610-9604 PT Time Calculation (min) (ACUTE ONLY): 35 min   Charges:   PT Evaluation $Initial PT Evaluation Tier I: 1 Procedure PT Treatments $Therapeutic Activity: 8-22 mins   PT G Codes:        Berton Mount 07/09/2014, 11:14 AM Charlsie Merles, PT [phone]

## 2014-07-09 NOTE — Progress Notes (Signed)
PULMONARY / CRITICAL CARE MEDICINE   Name: Timothy Trujillo MRN: [id number] DOB: 02-Apr-1951    ADMISSION DATE:  06/10/2014 CONSULTATION DATE:  3/30  REFERRING MD :  Feliz-ortiz ([facility])   CHIEF COMPLAINT:  Hypotension   INITIAL PRESENTATION: 64 yo male with hx DM, chronic sCHF, end-stage cardiomyopathy (EF 20%), severe O2 dependent COPD recent admission for hip fx with surgical repair on 3/19.  Was d/c and returned 3/29 with c/o SOB and chest tightness, lightheadedness.  Admitted by [facility] and treated for acute on chronic sCHF with diuresis.  On 3/30 pt developed worsening SOB, hypotension and PCCM consulted.   STUDIES:  CTA chest 3/29 >>> pulmonary edema with chronic interstitial changes  SIGNIFICANT EVENTS: 4/01 Add milrinone  SUBJECTIVE:  SOB and wheezing   VITAL SIGNS: Temp:  [97.1 F (36.2 C)-98 F (36.7 C)] 97.4 F (36.3 C) (04/02 0800) Pulse Rate:  [42-140] 100 (04/02 0800) Resp:  [10-37] 23 (04/02 0800) BP: (82-135)/(50-102) 113/81 mmHg (04/02 0800) SpO2:  [87 %-100 %] 87 % (04/02 0800) FiO2 (%):  [65 %] 65 % (04/02 0119) Weight:  [236 lb 15.9 oz (107.5 kg)] 236 lb 15.9 oz (107.5 kg) (04/02 0413) HEMODYNAMICS: CVP:  [8 mmHg-12 mmHg] 12 mmHg VENTILATOR SETTINGS: Vent Mode:  [-]  FiO2 (%):  [65 %] 65 % INTAKE / OUTPUT:  Intake/Output Summary (Last 24 hours) at 07/09/14 0845 Last data filed at 07/09/14 0600  Gross per 24 hour  Intake 1651.69 ml  Output   3065 ml  Net -1413.31 ml    PHYSICAL EXAMINATION: General:  Chronically ill appearing male, NAD , on v masks, low sats Neuro:  Awake, alert, gen weakness  HEENT:  Mm dry, pale, no JVD  Cardiovascular:  s1s2 rrr 90's, NSR  Lungs: resps shallow, + exp wheezes ,mild increase wob Abdomen:  Round, soft, non tender, +bs  Musculoskeletal:  Warm and dry, no edema   LABS:  CBC  Recent Labs Lab 07/07/14 0937 07/08/14 0600 07/09/14 0340  WBC 15.7* 16.4* 12.1*  HGB 10.2* 10.0* 9.1*  HCT 31.4* 30.5* 27.7*  PLT  282 303 264   Coag's  Recent Labs Lab 06/16/2014 1644  INR 1.06   BMET  Recent Labs Lab 07/08/14 0600 07/08/14 2000 07/09/14 0340  NA 138 134* 134*  K 3.8 4.0 3.9  CL 99 94* 94*  CO2 32 29 35*  BUN 17 20 21   CREATININE 0.78 0.95 0.91  GLUCOSE 197* 389* 291*   Electrolytes  Recent Labs Lab 06/22/2014 2050  07/08/14 0600 07/08/14 2000 07/09/14 0340  CALCIUM  --   < > 8.8 8.4 8.4  MG 1.1*  --  1.3*  --  2.0  PHOS  --   --  3.8  --   --   < > = values in this interval not displayed. Sepsis Markers  Recent Labs Lab 07/06/14 1326 07/06/14 1328 07/07/14 0308 07/08/14 0600  LATICACIDVEN  --  1.8  --   --   PROCALCITON 0.14  --  <0.10 <0.10   ABG  Recent Labs Lab 07/06/14 1714  PHART 7.468*  PCO2ART 39.8  PO2ART 81.0   Liver Enzymes  Recent Labs Lab 07/06/14 0436  AST 13  ALT 9  ALKPHOS 99  BILITOT 0.6  ALBUMIN 3.2*   Cardiac Enzymes  Recent Labs Lab 07/06/14 1326 07/06/14 2009 07/07/14 0045  TROPONINI 0.15* 0.13* 0.10*   Glucose  Recent Labs Lab 07/08/14 1239 07/08/14 1548 07/08/14 2014 07/08/14 2349 07/09/14 0411 07/09/14  0804  GLUCAP 218* 230* 356* 289* 249* 245*    Imaging Dg Chest 1 View  07/08/2014   CLINICAL DATA:  Dyspnea and nonproductive cough. History of COPD, CHF, diabetes, and hypertension.  EXAM: CHEST  1 VIEW  COMPARISON:  07/07/2014  FINDINGS: Right PICC remains in place with tip projecting over the lower SVC. Sequelae of prior CABG are again identified. The cardiac silhouette remains upper limits of normal to mildly enlarged in size. Diffuse bilateral lung opacities and small bilateral pleural effusions do not appear significantly changed. No pneumothorax is identified.  IMPRESSION: Pulmonary edema and small bilateral pleural effusions, unchanged.   Electronically Signed   By: Logan Bores   On: 07/08/2014 08:11     ASSESSMENT / PLAN:  PULMONARY A: Acute hypoxic respiratory failure 2nd to acute pulmonary edema >> CT  chest shows chronic changes ?if he also has ILD. Reported hx of severe COPD. P:   Oxygen to keep SpO2 88 to 95% F/u CXR BiPAP D/c pulmicort Scheduled atrovent/xopenex Check ESR 53 Change prednisone to solumedrol 4/01  CARDIOVASCULAR A: Acute on chronic systolic CHF >> baseline EF 20 to 25%. Hx of CAD s/p CABG. Cardiogenic shock. P:  Continue ASA, lipitor, plavix, digoxin, lasix drip Milrinone per cardiology  RENAL Lab Results  Component Value Date   CREATININE 0.91 07/09/2014   CREATININE 0.95 07/08/2014   CREATININE 0.78 07/08/2014   CREATININE 1.89* 07/05/2013   CREATININE 1.29 07/19/2011    A: No acute issues. P:   Monitor renal fx  GASTROINTESTINAL A: Nutrition. P:   Begin heart healthy diet once off BiPAP protonix for SUP  HEMATOLOGIC A: Leukocytosis. P:  F/u CBC SQ heparin for DVT prophylaxis  INFECTIOUS A: Concern for HCAP >> less likely given low procalcitonin. Coag neg staph in blood cx from 3/30 ?real vs contaminate. P:   Change Abx to ancef 4/01 and repeat blood cx  Blood 3/30 >> coag neg staph  ENDOCRINE CBG (last 3)   Recent Labs  07/08/14 2349 07/09/14 0411 07/09/14 0804  GLUCAP 289* 249* 245*   Poor control  A: DM P:   SSI, lantus   NEUROLOGIC A: Deconditioning. P:   Supportive care   ORTHO  A: L Hip fx  P:  PT/OT when stable   Goals of care >> he was previously DNR with palliative care.  Family requested to change to full code.  Richardson Landry Minor ACNP [facility] Pager [phone] till 3 pm If no answer page [phone] 07/09/2014, 8:45 AM

## 2014-07-09 NOTE — Progress Notes (Signed)
DAILY PROGRESS NOTE  Subjective:  Very anxious. Co-ox was high yesterday - seems to be diuresing well.  Maintaining sinus on amiodarone. CXR shows diffuse infiltrates which are stable. Off bipap but remains on NRB.  Noted to be hypotensive this am - MAP about 56.  Objective:  Temp:  [97.1 F (36.2 C)-98 F (36.7 C)] 97.4 F (36.3 C) (04/02 0800) Pulse Rate:  [42-108] 104 (04/02 1035) Resp:  [10-37] 21 (04/02 1035) BP: (82-126)/(50-86) 88/50 mmHg (04/02 1035) SpO2:  [87 %-100 %] 96 % (04/02 1035) FiO2 (%):  [55 %-65 %] 55 % (04/02 0846) Weight:  [236 lb 15.9 oz (107.5 kg)] 236 lb 15.9 oz (107.5 kg) (04/02 0413) Weight change: -2 lb 3.3 oz (-1 kg)  Intake/Output from previous day: 04/01 0701 - 04/02 0700 In: 1830.7 [P.O.:300; I.V.:1118.7; IV Piggyback:412] Out: 3065 [Urine:3065]  Intake/Output from this shift: Total I/O In: 434.4 [P.O.:360; I.V.:74.4] Out: 150 [Urine:150]  Medications: Current Facility-Administered Medications  Medication Dose Route Frequency Provider Last Rate Last Dose  . acetaminophen (TYLENOL) suppository 650 mg  650 mg Rectal Q6H PRN Waldemar Dickens, MD      . acetaminophen (TYLENOL) tablet 650 mg  650 mg Oral Q4H PRN Waldemar Dickens, MD      . ALPRAZolam Duanne Moron) tablet 0.25 mg  0.25 mg Oral TID PRN Charlynne Cousins, MD   0.25 mg at 07/09/14 0804  . amiodarone (NEXTERONE PREMIX) 360 MG/200ML (1.8 mg/mL) IV infusion  30 mg/hr Intravenous Continuous Jolaine Artist, MD 16.7 mL/hr at 07/09/14 0547 30 mg/hr at 07/09/14 0547  . antiseptic oral rinse (CPC / CETYLPYRIDINIUM CHLORIDE 0.05%) solution 7 mL  7 mL Mouth Rinse q12n4p Charlynne Cousins, MD      . aspirin tablet 325 mg  325 mg Oral Daily Waldemar Dickens, MD   325 mg at 07/09/14 0947  . atorvastatin (LIPITOR) tablet 80 mg  80 mg Oral q1800 Waldemar Dickens, MD   80 mg at 07/08/14 1814  . chlorhexidine (PERIDEX) 0.12 % solution 15 mL  15 mL Mouth Rinse BID Charlynne Cousins, MD   15 mL at  07/09/14 5573  . clopidogrel (PLAVIX) tablet 75 mg  75 mg Oral Daily Waldemar Dickens, MD   75 mg at 07/09/14 0947  . digoxin (LANOXIN) tablet 0.125 mg  0.125 mg Oral Daily Jolaine Artist, MD   0.125 mg at 07/09/14 0947  . feeding supplement (GLUCERNA SHAKE) (GLUCERNA SHAKE) liquid 237 mL  237 mL Oral TID BM Jenifer A Williams, RD   237 mL at 07/09/14 0950  . furosemide (LASIX) 250 mg in dextrose 5 % 250 mL (1 mg/mL) infusion  4 mg/hr Intravenous Continuous Charlynne Cousins, MD 4 mL/hr at 07/09/14 0820 4 mg/hr at 07/09/14 0820  . gi cocktail (Maalox,Lidocaine,Donnatal)  30 mL Oral QID PRN Waldemar Dickens, MD      . heparin injection 5,000 Units  5,000 Units Subcutaneous 3 times per day Marijean Heath, NP   5,000 Units at 07/09/14 0520  . hydrOXYzine (ATARAX/VISTARIL) tablet 25 mg  25 mg Oral TID PRN Jeryl Columbia, NP   25 mg at 07/06/14 2130  . insulin aspart (novoLOG) injection 0-9 Units  0-9 Units Subcutaneous 6 times per day Charlynne Cousins, MD   3 Units at 07/09/14 0813  . insulin detemir (LEVEMIR) injection 20 Units  20 Units Subcutaneous BID Charlynne Cousins, MD   20 Units at 07/09/14 [phone]  .  ipratropium (ATROVENT) nebulizer solution 0.5 mg  0.5 mg Nebulization Q4H Charlynne Cousins, MD      . isosorbide mononitrate (IMDUR) 24 hr tablet 60 mg  60 mg Oral Daily Waldemar Dickens, MD   60 mg at 07/09/14 0947  . levalbuterol (XOPENEX) nebulizer solution 0.63 mg  0.63 mg Nebulization Q2H PRN Chesley Mires, MD      . levalbuterol Penne Lash) nebulizer solution 0.63 mg  0.63 mg Nebulization Q4H Charlynne Cousins, MD      . milrinone [facility]) 20 MG/100ML (0.2 mg/mL) infusion  0.125 mcg/kg/min Intravenous Continuous Jolaine Artist, MD 4.1 mL/hr at 07/09/14 0149 0.125 mcg/kg/min at 07/09/14 0149  . nitroGLYCERIN (NITROSTAT) SL tablet 0.4 mg  0.4 mg Sublingual Q5 min PRN Charlynne Cousins, MD   0.4 mg at 07/08/14 0820  . norepinephrine (LEVOPHED) 4 mg in dextrose 5 % 250 mL  (0.016 mg/mL) infusion  0-40 mcg/min Intravenous Titrated Jolaine Artist, MD      . ondansetron [facility]) tablet 4 mg  4 mg Oral Q6H PRN Charlynne Cousins, MD      . oxyCODONE-acetaminophen (PERCOCET/ROXICET) 5-325 MG per tablet 1 tablet  1 tablet Oral Q4H PRN Waldemar Dickens, MD   1 tablet at 07/09/14 0813   And  . oxyCODONE (Oxy IR/ROXICODONE) immediate release tablet 5 mg  5 mg Oral Q4H PRN Waldemar Dickens, MD   5 mg at 07/09/14 0956  . polyethylene glycol (MIRALAX / GLYCOLAX) packet 17 g  17 g Oral Daily Charlynne Cousins, MD   17 g at 07/09/14 0947  . sertraline (ZOLOFT) tablet 50 mg  50 mg Oral Daily Waldemar Dickens, MD   50 mg at 07/09/14 0947  . sodium chloride 0.9 % injection 10-40 mL  10-40 mL Intracatheter Q12H Charlynne Cousins, MD   10 mL at 07/09/14 0950  . sodium chloride 0.9 % injection 10-40 mL  10-40 mL Intracatheter PRN Charlynne Cousins, MD   10 mL at 07/09/14 0950  . sodium chloride 0.9 % injection 3 mL  3 mL Intravenous Q12H Waldemar Dickens, MD   3 mL at 07/09/14 0950  . vitamin B-12 (CYANOCOBALAMIN) tablet 1,000 mcg  1,000 mcg Oral Daily Charlynne Cousins, MD   1,000 mcg at 07/09/14 0865    Physical Exam: General appearance: alert, moderate distress and on NRB Neck: JVD - 3 cm above sternal notch and no carotid bruit Lungs: diminished breath sounds bilaterally and rales bilaterally Heart: regular rate and rhythm, S3 present and occasional PVC's Abdomen: soft, non-tender; bowel sounds normal; no masses,  no organomegaly Extremities: edema trace Pulses: 2+ and symmetric Skin: Skin color, texture, turgor normal. No rashes or lesions Neurologic: Mental status: Alert, oriented, thought content appropriate, very anxious  Lab Results: Results for orders placed or performed during the hospital encounter of 06/27/2014 (from the past 48 hour(s))  Glucose, capillary     Status: Abnormal   Collection Time: 07/07/14 11:24 AM  Result Value Ref Range    Glucose-Capillary 310 (H) 70 - 99 mg/dL   Comment 1 Capillary Specimen   Glucose, capillary     Status: Abnormal   Collection Time: 07/07/14  5:09 PM  Result Value Ref Range   Glucose-Capillary 155 (H) 70 - 99 mg/dL  Glucose, capillary     Status: Abnormal   Collection Time: 07/07/14  9:29 PM  Result Value Ref Range   Glucose-Capillary 259 (H) 70 - 99 mg/dL   Comment  1 Capillary Specimen   Procalcitonin     Status: None   Collection Time: 07/08/14  6:00 AM  Result Value Ref Range   Procalcitonin <0.10 ng/mL    Comment:        Interpretation: PCT (Procalcitonin) <= 0.5 ng/mL: Systemic infection (sepsis) is not likely. Local bacterial infection is possible. (NOTE)         ICU PCT Algorithm               Non ICU PCT Algorithm    ----------------------------     ------------------------------         PCT < 0.25 ng/mL                 PCT < 0.1 ng/mL     Stopping of antibiotics            Stopping of antibiotics       strongly encouraged.               strongly encouraged.    ----------------------------     ------------------------------       PCT level decrease by               PCT < 0.25 ng/mL       >= 80% from peak PCT       OR PCT 0.25 - 0.5 ng/mL          Stopping of antibiotics                                             encouraged.     Stopping of antibiotics           encouraged.    ----------------------------     ------------------------------       PCT level decrease by              PCT >= 0.25 ng/mL       < 80% from peak PCT        AND PCT >= 0.5 ng/mL            Continuin g antibiotics                                              encouraged.       Continuing antibiotics            encouraged.    ----------------------------     ------------------------------     PCT level increase compared          PCT > 0.5 ng/mL         with peak PCT AND          PCT >= 0.5 ng/mL             Escalation of antibiotics                                          strongly  encouraged.      Escalation of antibiotics        strongly encouraged.   CBC     Status: Abnormal   Collection Time: 07/08/14  6:00 AM  Result Value Ref  Range   WBC 16.4 (H) 4.0 - 10.5 K/uL   RBC 3.45 (L) 4.22 - 5.81 MIL/uL   Hemoglobin 10.0 (L) 13.0 - 17.0 g/dL   HCT 30.5 (L) 39.0 - 52.0 %   MCV 88.4 78.0 - 100.0 fL   MCH 29.0 26.0 - 34.0 pg   MCHC 32.8 30.0 - 36.0 g/dL   RDW 15.1 11.5 - 15.5 %   Platelets 303 150 - 400 K/uL  Basic metabolic panel     Status: Abnormal   Collection Time: 07/08/14  6:00 AM  Result Value Ref Range   Sodium 138 135 - 145 mmol/L   Potassium 3.8 3.5 - 5.1 mmol/L   Chloride 99 96 - 112 mmol/L   CO2 32 19 - 32 mmol/L   Glucose, Bld 197 (H) 70 - 99 mg/dL   BUN 17 6 - 23 mg/dL   Creatinine, Ser 0.78 0.50 - 1.35 mg/dL   Calcium 8.8 8.4 - 10.5 mg/dL   GFR calc non Af Amer >90 >90 mL/min   GFR calc Af Amer >90 >90 mL/min    Comment: (NOTE) The eGFR has been calculated using the CKD EPI equation. This calculation has not been validated in all clinical situations. eGFR's persistently <90 mL/min signify possible Chronic Kidney Disease.    Anion gap 7 5 - 15  Magnesium     Status: Abnormal   Collection Time: 07/08/14  6:00 AM  Result Value Ref Range   Magnesium 1.3 (L) 1.5 - 2.5 mg/dL  Phosphorus     Status: None   Collection Time: 07/08/14  6:00 AM  Result Value Ref Range   Phosphorus 3.8 2.3 - 4.6 mg/dL  Glucose, capillary     Status: Abnormal   Collection Time: 07/08/14  7:51 AM  Result Value Ref Range   Glucose-Capillary 195 (H) 70 - 99 mg/dL  Carboxyhemoglobin     Status: Abnormal   Collection Time: 07/08/14  7:55 AM  Result Value Ref Range   Total hemoglobin 10.8 (L) 13.5 - 18.0 g/dL   O2 Saturation 33.4 %   Carboxyhemoglobin 1.0 0.5 - 1.5 %   Methemoglobin 0.9 0.0 - 1.5 %  TSH     Status: None   Collection Time: 07/08/14  8:11 AM  Result Value Ref Range   TSH 1.779 0.350 - 4.500 uIU/mL  Glucose, capillary     Status: Abnormal    Collection Time: 07/08/14 10:37 AM  Result Value Ref Range   Glucose-Capillary 199 (H) 70 - 99 mg/dL   Comment 1 Notify RN   Sedimentation rate     Status: Abnormal   Collection Time: 07/08/14 11:34 AM  Result Value Ref Range   Sed Rate 53 (H) 0 - 16 mm/hr  Culture, blood (routine x 2)     Status: None (Preliminary result)   Collection Time: 07/08/14 11:50 AM  Result Value Ref Range   Specimen Description BLOOD LEFT ANTECUBITAL    Special Requests BOTTLES DRAWN AEROBIC ONLY 10CC    Culture             BLOOD CULTURE RECEIVED NO GROWTH TO DATE CULTURE WILL BE HELD FOR 5 DAYS BEFORE ISSUING A FINAL NEGATIVE REPORT Performed at [facility]    Report Status PENDING   Culture, blood (routine x 2)     Status: None (Preliminary result)   Collection Time: 07/08/14 12:00 PM  Result Value Ref Range   Specimen Description BLOOD LEFT ARM    Special Requests BOTTLES  DRAWN AEROBIC ONLY 10CC    Culture             BLOOD CULTURE RECEIVED NO GROWTH TO DATE CULTURE WILL BE HELD FOR 5 DAYS BEFORE ISSUING A FINAL NEGATIVE REPORT Performed at [facility]    Report Status PENDING   Carboxyhemoglobin     Status: Abnormal   Collection Time: 07/08/14 12:25 PM  Result Value Ref Range   Total hemoglobin 8.8 (L) 13.5 - 18.0 g/dL   O2 Saturation 77.7 %   Carboxyhemoglobin 1.1 0.5 - 1.5 %   Methemoglobin 0.9 0.0 - 1.5 %  Glucose, capillary     Status: Abnormal   Collection Time: 07/08/14 12:39 PM  Result Value Ref Range   Glucose-Capillary 218 (H) 70 - 99 mg/dL   Comment 1 Document in Chart   Glucose, capillary     Status: Abnormal   Collection Time: 07/08/14  3:48 PM  Result Value Ref Range   Glucose-Capillary 230 (H) 70 - 99 mg/dL   Comment 1 Notify RN   Basic metabolic panel     Status: Abnormal   Collection Time: 07/08/14  8:00 PM  Result Value Ref Range   Sodium 134 (L) 135 - 145 mmol/L   Potassium 4.0 3.5 - 5.1 mmol/L   Chloride 94 (L) 96 - 112 mmol/L   CO2 29 19 - 32  mmol/L   Glucose, Bld 389 (H) 70 - 99 mg/dL   BUN 20 6 - 23 mg/dL   Creatinine, Ser 0.95 0.50 - 1.35 mg/dL   Calcium 8.4 8.4 - 10.5 mg/dL   GFR calc non Af Amer 85 (L) >90 mL/min   GFR calc Af Amer >90 >90 mL/min    Comment: (NOTE) The eGFR has been calculated using the CKD EPI equation. This calculation has not been validated in all clinical situations. eGFR's persistently <90 mL/min signify possible Chronic Kidney Disease.    Anion gap 11 5 - 15  Glucose, capillary     Status: Abnormal   Collection Time: 07/08/14  8:14 PM  Result Value Ref Range   Glucose-Capillary 356 (H) 70 - 99 mg/dL   Comment 1 Capillary Specimen   Glucose, capillary     Status: Abnormal   Collection Time: 07/08/14 11:49 PM  Result Value Ref Range   Glucose-Capillary 289 (H) 70 - 99 mg/dL   Comment 1 Capillary Specimen   Basic metabolic panel     Status: Abnormal   Collection Time: 07/09/14  3:40 AM  Result Value Ref Range   Sodium 134 (L) 135 - 145 mmol/L   Potassium 3.9 3.5 - 5.1 mmol/L   Chloride 94 (L) 96 - 112 mmol/L   CO2 35 (H) 19 - 32 mmol/L   Glucose, Bld 291 (H) 70 - 99 mg/dL   BUN 21 6 - 23 mg/dL   Creatinine, Ser 0.91 0.50 - 1.35 mg/dL   Calcium 8.4 8.4 - 10.5 mg/dL   GFR calc non Af Amer 87 (L) >90 mL/min   GFR calc Af Amer >90 >90 mL/min    Comment: (NOTE) The eGFR has been calculated using the CKD EPI equation. This calculation has not been validated in all clinical situations. eGFR's persistently <90 mL/min signify possible Chronic Kidney Disease.    Anion gap 5 5 - 15  Magnesium     Status: None   Collection Time: 07/09/14  3:40 AM  Result Value Ref Range   Magnesium 2.0 1.5 - 2.5 mg/dL  Carboxyhemoglobin  Status: Abnormal   Collection Time: 07/09/14  3:40 AM  Result Value Ref Range   Total hemoglobin 9.3 (L) 13.5 - 18.0 g/dL   O2 Saturation 69.3 %   Carboxyhemoglobin 0.9 0.5 - 1.5 %   Methemoglobin 0.8 0.0 - 1.5 %  CBC     Status: Abnormal   Collection Time: 07/09/14   3:40 AM  Result Value Ref Range   WBC 12.1 (H) 4.0 - 10.5 K/uL   RBC 3.17 (L) 4.22 - 5.81 MIL/uL   Hemoglobin 9.1 (L) 13.0 - 17.0 g/dL   HCT 27.7 (L) 39.0 - 52.0 %   MCV 87.4 78.0 - 100.0 fL   MCH 28.7 26.0 - 34.0 pg   MCHC 32.9 30.0 - 36.0 g/dL   RDW 14.8 11.5 - 15.5 %   Platelets 264 150 - 400 K/uL  Glucose, capillary     Status: Abnormal   Collection Time: 07/09/14  4:11 AM  Result Value Ref Range   Glucose-Capillary 249 (H) 70 - 99 mg/dL   Comment 1 Capillary Specimen   Glucose, capillary     Status: Abnormal   Collection Time: 07/09/14  8:04 AM  Result Value Ref Range   Glucose-Capillary 245 (H) 70 - 99 mg/dL    Imaging: Dg Chest 1 View  07/08/2014   CLINICAL DATA:  Dyspnea and nonproductive cough. History of COPD, CHF, diabetes, and hypertension.  EXAM: CHEST  1 VIEW  COMPARISON:  07/07/2014  FINDINGS: Right PICC remains in place with tip projecting over the lower SVC. Sequelae of prior CABG are again identified. The cardiac silhouette remains upper limits of normal to mildly enlarged in size. Diffuse bilateral lung opacities and small bilateral pleural effusions do not appear significantly changed. No pneumothorax is identified.  IMPRESSION: Pulmonary edema and small bilateral pleural effusions, unchanged.   Electronically Signed   By: Logan Bores   On: 07/08/2014 08:11   Dg Chest Port 1 View  07/09/2014   CLINICAL DATA:  CHF  EXAM: PORTABLE CHEST - 1 VIEW  COMPARISON:  07/08/2014  FINDINGS: Cardiac shadow is again enlarged. Diffuse changes of pulmonary edema are again identified bilaterally. Bilateral pleural effusions are noted. A right-sided PICC line is again noted in the distal superior vena cava. No new focal abnormality is seen.  IMPRESSION: CHF stable from the prior exam.  No acute abnormality is noted.   Electronically Signed   By: Inez Catalina M.D.   On: 07/09/2014 08:06   Dg Chest Port 1 View  07/07/2014   CLINICAL DATA:  Chest pain short breath for several days  EXAM:  PORTABLE CHEST - 1 VIEW  COMPARISON:  Radiograph 07/06/2014  FINDINGS: Sternotomy wires overlie normal cardiac silhouette. There is diffuse airspace disease not changed from prior. Interval introduction of right PICC line with tip in distal SVC. No pneumothorax  IMPRESSION: 1. Right PICC line in good position. 2. Pulmonary edema pattern not changed prior. 3. Small bilateral pleural effusions.   Electronically Signed   By: Suzy Bouchard M.D.   On: 07/07/2014 17:45    Assessment:  1. Active Problems: 2.   Hypertension 3.   COPD, severe  O2 dependent 4.   Chest pain 5.   Diabetes mellitus type 2, uncontrolled 6.   Acute on chronic systolic heart failure 7.   Femoral neck fracture 8.   Depression with anxiety 9.   Acute respiratory failure with hypoxia 10.   Acute on chronic systolic congestive heart failure 11.   Dyspnea  12.   Plan:  1. Still requiring mirinone - more hypotensive this am. Weaned off bipap, but CXR shows diffuse infiltrates. Co-Ox was almost 70 this am. May need to add low dose levophed. CVP this am was 12. On IV amiodarone - appears to be in sinus tachycardia. He is requesting more anxiety medicine - but I'm concerned about that given his low blood pressure.  CRITICAL CARE:  The patient is critically ill with multi-organ system failure and requires high complexity decision making for assessment and support, frequent evaluation and titration of therapies, application of advanced monitoring technologies and extensive interpretation of multiple databases.  Time Spent Directly with Patient:  30 minutes  Length of Stay:  LOS: 4 days   Pixie Casino, MD, [facility] Attending Cardiologist [facility]  Sherle Mello C 07/09/2014, 11:04 AM

## 2014-07-10 ENCOUNTER — Inpatient Hospital Stay (HOSPITAL_COMMUNITY): Payer: Commercial Managed Care - HMO

## 2014-07-10 DIAGNOSIS — J9601 Acute respiratory failure with hypoxia: Secondary | ICD-10-CM

## 2014-07-10 DIAGNOSIS — R570 Cardiogenic shock: Secondary | ICD-10-CM

## 2014-07-10 LAB — GLUCOSE, CAPILLARY
GLUCOSE-CAPILLARY: 127 mg/dL — AB (ref 70–99)
GLUCOSE-CAPILLARY: 136 mg/dL — AB (ref 70–99)
GLUCOSE-CAPILLARY: 127 mg/dL — AB (ref 70–99)
GLUCOSE-CAPILLARY: 100 mg/dL — AB (ref 70–99)
Glucose-Capillary: 179 mg/dL — ABNORMAL HIGH (ref 70–99)
Glucose-Capillary: 132 mg/dL — ABNORMAL HIGH (ref 70–99)

## 2014-07-10 LAB — BASIC METABOLIC PANEL
ANION GAP: 13 (ref 5–15)
BUN: 26 mg/dL — AB (ref 6–23)
CALCIUM: 8.9 mg/dL (ref 8.4–10.5)
CHLORIDE: 94 mmol/L — AB (ref 96–112)
CO2: 29 mmol/L (ref 19–32)
Creatinine, Ser: 0.96 mg/dL (ref 0.50–1.35)
GFR calc Af Amer: >90 mL/min (ref >90)
GFR calc non Af Amer: 85 mL/min — ABNORMAL LOW (ref >90)
Glucose, Bld: 149 mg/dL — ABNORMAL HIGH (ref 70–99)
POTASSIUM: 3.7 mmol/L (ref 3.5–5.1)
Sodium: 136 mmol/L (ref 135–145)

## 2014-07-10 LAB — POCT I-STAT 3, ART BLOOD GAS (G3+)
ACID-BASE EXCESS: 2 mmol/L (ref 0.0–2.0)
Bicarbonate: 26.4 mEq/L — ABNORMAL HIGH (ref 20.0–24.0)
O2 Saturation: 98 %
PH ART: 7.426 (ref 7.350–7.450)
PO2 ART: 108 mmHg — AB (ref 80.0–100.0)
TCO2: 28 mmol/L (ref 0–100)
pCO2 arterial: 39.9 mmHg (ref 35.0–45.0)

## 2014-07-10 LAB — CARBOXYHEMOGLOBIN
Carboxyhemoglobin: 1 % (ref 0.5–1.5)
METHEMOGLOBIN: 0.7 % (ref 0.0–1.5)
O2 SAT: 74.9 %
TOTAL HEMOGLOBIN: 9.2 g/dL — AB (ref 13.5–18.0)

## 2014-07-10 LAB — MAGNESIUM: Magnesium: 1.7 mg/dL (ref 1.5–2.5)

## 2014-07-10 LAB — PHOSPHORUS: Phosphorus: 2.5 mg/dL (ref 2.3–4.6)

## 2014-07-10 MED ORDER — ONDANSETRON HCL 4 MG/2ML IJ SOLN
4.0000 mg | Freq: Four times a day (QID) | INTRAMUSCULAR | Status: DC | PRN
  Filled 2014-07-10: qty 2

## 2014-07-10 MED ORDER — CHLORHEXIDINE GLUCONATE 0.12 % MT SOLN
15.0000 mL | Freq: Two times a day (BID) | OROMUCOSAL | Status: DC
  Administered 2014-07-10 – 2014-07-12 (×5): 15 mL via OROMUCOSAL
  Filled 2014-07-10 (×5): qty 15

## 2014-07-10 MED ORDER — MIDAZOLAM HCL 2 MG/2ML IJ SOLN
INTRAMUSCULAR | Status: AC
  Administered 2014-07-10: 2 mg via INTRAVENOUS
  Filled 2014-07-10: qty 2

## 2014-07-10 MED ORDER — FENTANYL CITRATE 0.05 MG/ML IJ SOLN
INTRAMUSCULAR | Status: AC
  Administered 2014-07-10: 50 ug via INTRAVENOUS
  Filled 2014-07-10: qty 2

## 2014-07-10 MED ORDER — ROCURONIUM BROMIDE 50 MG/5ML IV SOLN
INTRAVENOUS | Status: AC
  Filled 2014-07-10: qty 2

## 2014-07-10 MED ORDER — SALINE SPRAY 0.65 % NA SOLN
1.0000 | NASAL | Status: DC | PRN
  Administered 2014-07-10: 1 via NASAL
  Filled 2014-07-10: qty 44

## 2014-07-10 MED ORDER — MIDAZOLAM HCL 2 MG/2ML IJ SOLN
2.0000 mg | Freq: Once | INTRAMUSCULAR | Status: AC
  Administered 2014-07-10: 2 mg via INTRAVENOUS

## 2014-07-10 MED ORDER — VITAL HIGH PROTEIN PO LIQD
1000.0000 mL | ORAL | Status: DC
  Administered 2014-07-10: 1000 mL
  Administered 2014-07-11: 07:00:00
  Filled 2014-07-10 (×3): qty 1000

## 2014-07-10 MED ORDER — SUCCINYLCHOLINE CHLORIDE 20 MG/ML IJ SOLN
INTRAMUSCULAR | Status: AC
  Filled 2014-07-10: qty 1

## 2014-07-10 MED ORDER — CHLORHEXIDINE GLUCONATE 0.12 % MT SOLN: 15.0000 mL | Freq: Two times a day (BID) | OROMUCOSAL | Status: DC

## 2014-07-10 MED ORDER — AMIODARONE IV BOLUS ONLY 150 MG/100ML
150.0000 mg | Freq: Once | INTRAVENOUS | Status: AC
  Administered 2014-07-10: 150 mg via INTRAVENOUS

## 2014-07-10 MED ORDER — SODIUM CHLORIDE 0.9 % IV SOLN
10.0000 ug/h | INTRAVENOUS | Status: DC
  Administered 2014-07-10: 10 ug/h via INTRAVENOUS
  Administered 2014-07-11: 150 ug/h via INTRAVENOUS
  Administered 2014-07-11: 200 ug/h via INTRAVENOUS
  Administered 2014-07-12: 50 ug/h via INTRAVENOUS
  Filled 2014-07-10 (×4): qty 50

## 2014-07-10 MED ORDER — CETYLPYRIDINIUM CHLORIDE 0.05 % MT LIQD
7.0000 mL | Freq: Four times a day (QID) | OROMUCOSAL | Status: DC
  Administered 2014-07-10 – 2014-07-12 (×10): 7 mL via OROMUCOSAL

## 2014-07-10 MED ORDER — ETOMIDATE 2 MG/ML IV SOLN
INTRAVENOUS | Status: AC
  Administered 2014-07-10: 40 mg via INTRAVENOUS
  Filled 2014-07-10: qty 20

## 2014-07-10 MED ORDER — FENTANYL CITRATE 0.05 MG/ML IJ SOLN
50.0000 ug | Freq: Once | INTRAMUSCULAR | Status: AC
  Administered 2014-07-10: 50 ug via INTRAVENOUS

## 2014-07-10 MED ORDER — SODIUM CHLORIDE 0.9 % IV SOLN: INTRAVENOUS | Status: DC | PRN

## 2014-07-10 MED ORDER — DEXMEDETOMIDINE HCL IN NACL 200 MCG/50ML IV SOLN
0.4000 ug/kg/h | INTRAVENOUS | Status: DC
  Administered 2014-07-10: 0.4 ug/kg/h via INTRAVENOUS
  Administered 2014-07-10: 0.8 ug/kg/h via INTRAVENOUS
  Filled 2014-07-10 (×2): qty 50

## 2014-07-10 MED ORDER — ETOMIDATE 2 MG/ML IV SOLN
40.0000 mg | Freq: Once | INTRAVENOUS | Status: AC
  Administered 2014-07-10: 40 mg via INTRAVENOUS
  Filled 2014-07-10: qty 20

## 2014-07-10 MED ORDER — PANTOPRAZOLE SODIUM 40 MG IV SOLR
40.0000 mg | INTRAVENOUS | Status: DC
  Administered 2014-07-10 – 2014-07-11 (×2): 40 mg via INTRAVENOUS
  Filled 2014-07-10 (×3): qty 40

## 2014-07-10 MED ORDER — LIDOCAINE HCL (CARDIAC) 20 MG/ML IV SOLN
INTRAVENOUS | Status: AC
  Filled 2014-07-10: qty 5

## 2014-07-10 NOTE — Progress Notes (Signed)
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  Was d/c and returned 3/29 with c/o SOB and chest tightness, lightheadedness.  Admitted by [facility] and treated for acute on chronic sCHF with diuresis.  On 3/30 pt developed worsening SOB, hypotension and PCCM consulted.   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) , reports exposure to silica  STUDIES:  CTA chest 3/29 >>> pulmonary edema with chronic interstitial changes  SIGNIFICANT EVENTS: 4/01 Add milrinone 4/3 intubated  SUBJECTIVE:  Sedated on vent  VITAL SIGNS: Temp:  [97.5 F (36.4 C)-97.8 F (36.6 C)] 97.7 F (36.5 C) (04/03 0400) Pulse Rate:  [49-124] 117 (04/03 0719) Resp:  [15-43] 29 (04/03 0719) BP: (70-160)/(26-111) 70/26 mmHg (04/03 0719) SpO2:  [77 %-100 %] 100 % (04/03 0719) FiO2 (%):  [45 %-100 %] 100 % (04/03 0719) Weight:  [237 lb 14 oz (107.9 kg)] 237 lb 14 oz (107.9 kg) (04/03 0359) HEMODYNAMICS: CVP:  [0 mmHg-14 mmHg] 11 mmHg VENTILATOR SETTINGS: Vent Mode:  [-] PRVC FiO2 (%):  [45 %-100 %] 100 % Set Rate:  [25 bmp] 25 bmp Vt Set:  [650 mL] 650 mL PEEP:  [10 cmH20] 10 cmH20 Plateau Pressure:  [27 cmH20-37 cmH20] 27 cmH20 INTAKE / OUTPUT:  Intake/Output Summary (Last 24 hours) at 07/10/14 0824 Last data filed at 07/10/14 0700  Gross per 24 hour  Intake 1578.07 ml  Output   2325 ml  Net -746.93 ml    PHYSICAL EXAMINATION: General:  Chronically ill appearing male, NAD , on full vent suppor Neuro:  Sedated on vent HEENT:  Mm dry, pale, no JVD  Cardiovascular:  HSIR 122 Lungs: mild rhonchi  bilat Abdomen:  Round, soft, non tender, +bs  Musculoskeletal:  Warm and dry, no edema   LABS:  CBC  Recent Labs Lab 07/07/14 0937 07/08/14 0600 07/09/14 0340  WBC 15.7* 16.4* 12.1*  HGB 10.2* 10.0* 9.1*  HCT 31.4* 30.5* 27.7*  PLT 282 303 264   Coag's  Recent Labs Lab 06/30/2014 1644  INR 1.06   BMET  Recent Labs Lab 07/08/14 2000 07/09/14 0340 07/10/14 0350  NA 134* 134* 136  K 4.0 3.9 3.7  CL 94* 94* 94*  CO2 29 35* 29  BUN 20 21 26*  CREATININE 0.95 0.91 0.96  GLUCOSE 389* 291* 149*   Electrolytes  Recent Labs Lab 07/08/14 0600 07/08/14 2000 07/09/14 0340 07/10/14 0350  CALCIUM 8.8 8.4 8.4 8.9  MG 1.3*  --  2.0 1.7  PHOS 3.8  --   --  2.5   Sepsis Markers  Recent Labs Lab 07/06/14 1326 07/06/14 1328 07/07/14 0308 07/08/14 0600  LATICACIDVEN  --  1.8  --   --   PROCALCITON 0.14  --  <0.10 <0.10   ABG  Recent Labs Lab 07/06/14 1714 07/10/14 0708  PHART 7.468* 7.426  PCO2ART 39.8 39.9  PO2ART 81.0 108.0*   Liver Enzymes  Recent Labs Lab 07/06/14 0436  AST 13  ALT 9  ALKPHOS 99  BILITOT 0.6  ALBUMIN 3.2*   Cardiac Enzymes  Recent Labs Lab 07/06/14 1326  07/06/14 2009 07/07/14 0045  TROPONINI 0.15* 0.13* 0.10*   Glucose  Recent Labs Lab 07/09/14 0804 07/09/14 1223 07/09/14 1616 07/09/14 2043 07/09/14 2348 07/10/14 0340  GLUCAP 245* 309* 339* 275* 179* 127*    Imaging Dg Chest Port 1 View  07/09/2014   CLINICAL DATA:  CHF  EXAM: PORTABLE CHEST - 1 VIEW  COMPARISON:  07/08/2014  FINDINGS: Cardiac shadow is again enlarged. Diffuse changes of pulmonary edema are again identified bilaterally. Bilateral pleural effusions are noted. A right-sided PICC line is again noted in the distal superior vena cava. No new focal abnormality is seen.  IMPRESSION: CHF stable from the prior exam.  No acute abnormality is noted.   Electronically Signed   By: Inez Catalina M.D.   On: 07/09/2014 08:06     ASSESSMENT /  PLAN:  PULMONARY A: 4/3 OTT>> Acute hypoxic respiratory failure 2nd to acute pulmonary edema >> CT chest shows chronic changes ?if he also has ILD. Reported hx of severe COPD. P:   Oxygen to keep SpO2 88 to 95% F/u CXR Intubated 4/3 D/c pulmicort Scheduled atrovent/xopenex Check ESR 53 Change prednisone to solumedrol 4/01 Wean as tolerated Add PEEP 10  CARDIOVASCULAR A: Acute on chronic systolic CHF >> baseline EF 20 to 25%. Hx of CAD s/p CABG. Cardiogenic shock - post intubation meds P:  Continue ASA, lipitor, plavix, digoxin, lasix drip Milrinone per cardiology  RENAL Lab Results  Component Value Date   CREATININE 0.96 07/10/2014   CREATININE 0.91 07/09/2014   CREATININE 0.95 07/08/2014   CREATININE 1.89* 07/05/2013   CREATININE 1.29 07/19/2011    A: No acute issues. P:   Monitor renal fx  GASTROINTESTINAL A: Nutrition. P:   Begin heart healthy diet once off BiPAP protonix for SUP  HEMATOLOGIC A: Leukocytosis. P:  F/u CBC SQ heparin for DVT prophylaxis  INFECTIOUS A: Concern for HCAP >> less likely given low procalcitonin. Coag neg staph in blood cx from 3/30 ?real vs contaminate. P:   Change Abx to ancef 4/01 and repeat blood cx  Blood 3/30 >> coag neg staph bc 4/1>>  ENDOCRINE CBG (last 3)   Recent Labs  07/09/14 2043 07/09/14 2348 07/10/14 0340  GLUCAP 275* 179* 127*   Poor control  A: DM P:   SSI, lantus   NEUROLOGIC A: Deconditioning. P:   Supportive care   ORTHO  A: L Hip fx  P:  PT/OT when stable   Goals of care >> he was previously DNR with palliative care.  Family requested to change to full code. 4/3 intubated after episode a fib. May want to revisit code status    Richardson Landry Minor ACNP Maryanna Shape PCCM Pager [phone] till 3 pm If no answer page [phone]    07/10/2014, 8:24 AM   ATTENDING NOTE: I have personally reviewed patient's available data, including medical history, events of note, physical  examination and test results as part of my evaluation. I have discussed with resident/NP and other careteam providers such as pharmacist, RN and RRT & co-ordinated with consultants. In addition, I personally evaluated patient and elicited key history of ILD, systolic CHF, exam findings of intubated, Bl scattered crackles, awake on low dose fent & precedex gtt, on levo @ 20 , soft abdomen & labs showing pO2 108 on 100%/+10  Drop FIO2 -keep PEEP @ 10 Dc lasix gtt while on pressors - aim for equal balance Dc precedex gtt  Can use higher fent gtt for comfort, goal RASS -1 Prognosis guarded  Rest per NP/medical resident whose note is outlined above and that I agree with and edited in full.   The patient is critically ill with multiple organ systems failure and requires high complexity decision making for assessment and support, frequent evaluation and titration of therapies, application of advanced monitoring technologies and extensive interpretation of multiple databases. Critical Care Time devoted to patient care services described in this note independent of APP time is 35 minutes.   Rigoberto Noel MD

## 2014-07-10 NOTE — Procedures (Signed)
Intubation Procedure Note Timothy Trujillo [id number] 03-Jun-1950  Procedure: Intubation Indications: Respiratory insufficiency  Procedure Details Consent: Unable to obtain consent because of emergent medical necessity. Time Out: Verified patient identification, verified procedure, site/side was marked, verified correct patient position, special equipment/implants available, medications/allergies/relevent history reviewed, required imaging and test results available.  Performed  MAC   Evaluation Hemodynamic Status: BP stable throughout; O2 sats: transiently fell during during procedure Patient's Current Condition: stable Complications: No apparent complications Patient did tolerate procedure well. Chest X-ray ordered to verify placement.  CXR: pending.   Timothy Trujillo 07/10/2014

## 2014-07-10 NOTE — Progress Notes (Addendum)
Brief Nutrition Note  Consult received for enteral/tube feeding initiation and management.  Adult Enteral Nutrition Protocol initiated. RD to follow-up 4/04.  Admitting Dx: Acute on chronic systolic congestive heart failure [I50.23]  Body mass index is 29.73 kg/(m^2). Pt meets criteria for overweight based on current BMI.  Labs:   Recent Labs Lab 07/08/14 0600 07/08/14 2000 07/09/14 0340 07/10/14 0350  NA 138 134* 134* 136  K 3.8 4.0 3.9 3.7  CL 99 94* 94* 94*  CO2 32 29 35* 29  BUN 17 20 21  26*  CREATININE 0.78 0.95 0.91 0.96  CALCIUM 8.8 8.4 8.4 8.9  MG 1.3*  --  2.0 1.7  PHOS 3.8  --   --  2.5  GLUCOSE 197* 389* 291* 149*    Tilda FrancoLindsey Lacheryl Niesen, MS, RD, LDN Pager: [phone] After Hours Pager: [phone]

## 2014-07-10 NOTE — Progress Notes (Signed)
Pt on bipap and pt states he still cannot breathe at 0600, pt stated he needed to vomit, RN took bipap off, pt vomited, called Elink, MD viewed patient via camera, fellow Christianto came to bedside for intubation. Pt now stable on ventilator. Will continue to monitor.

## 2014-07-10 NOTE — Progress Notes (Signed)
DAILY PROGRESS NOTE  Subjective:  Significant decompensation overnight - agitated yesterday, became progressively more hypotensive. Went into a-fib with RVR. Vomited and required intubation. Previously DNR with severe CAD and ischemic cardiomyopathy without revascularization options.  Now requiring increased dose pressors.  Objective:  Temp:  [97.5 F (36.4 C)-97.8 F (36.6 C)] 97.7 F (36.5 C) (04/03 0400) Pulse Rate:  [49-124] 110 (04/03 0830) Resp:  [14-43] 25 (04/03 0930) BP: (70-160)/(25-111) 84/25 mmHg (04/03 0930) SpO2:  [77 %-100 %] 98 % (04/03 0830) FiO2 (%):  [45 %-100 %] 100 % (04/03 0719) Weight:  [237 lb 14 oz (107.9 kg)] 237 lb 14 oz (107.9 kg) (04/03 0359) Weight change: 14.1 oz (0.4 kg)  Intake/Output from previous day: 04/02 0701 - 04/03 0700 In: 1602.9 [P.O.:960; I.V.:642.9] Out: 2325 [Urine:2325]  Intake/Output from this shift: Total I/O In: 33.4 [I.V.:33.4] Out: -   Medications: Current Facility-Administered Medications  Medication Dose Route Frequency Provider Last Rate Last Dose  . acetaminophen (TYLENOL) suppository 650 mg  650 mg Rectal Q6H PRN Waldemar Dickens, MD      . acetaminophen (TYLENOL) tablet 650 mg  650 mg Oral Q4H PRN Waldemar Dickens, MD      . ALPRAZolam Duanne Moron) tablet 0.25 mg  0.25 mg Oral TID PRN Charlynne Cousins, MD   0.25 mg at 07/10/14 0336  . amiodarone (NEXTERONE PREMIX) 360 MG/200ML (1.8 mg/mL) IV infusion  30 mg/hr Intravenous Continuous Jolaine Artist, MD 16.7 mL/hr at 07/10/14 0432 30 mg/hr at 07/10/14 0432  . antiseptic oral rinse (CPC / CETYLPYRIDINIUM CHLORIDE 0.05%) solution 7 mL  7 mL Mouth Rinse q12n4p Charlynne Cousins, MD   7 mL at 07/09/14 1600  . aspirin tablet 325 mg  325 mg Oral Daily Grace Bushy Minor, NP   325 mg at 07/09/14 0947  . atorvastatin (LIPITOR) tablet 80 mg  80 mg Oral q1800 Waldemar Dickens, MD   80 mg at 07/09/14 1710  . chlorhexidine (PERIDEX) 0.12 % solution 15 mL  15 mL Mouth Rinse BID  Charlynne Cousins, MD   15 mL at 07/10/14 0751  . clopidogrel (PLAVIX) tablet 75 mg  75 mg Oral Daily Waldemar Dickens, MD   75 mg at 07/09/14 0947  . dexmedetomidine (PRECEDEX) 200 MCG/50ML (4 mcg/mL) infusion  0.4-1.2 mcg/kg/hr Intravenous Titrated Alric Quan, MD 21.6 mL/hr at 07/10/14 0914 0.8 mcg/kg/hr at 07/10/14 0914  . digoxin (LANOXIN) tablet 0.125 mg  0.125 mg Oral Daily Jolaine Artist, MD   0.125 mg at 07/09/14 0947  . feeding supplement (GLUCERNA SHAKE) (GLUCERNA SHAKE) liquid 237 mL  237 mL Oral TID BM Jenifer A Williams, RD   237 mL at 07/09/14 1454  . feeding supplement (VITAL HIGH PROTEIN) liquid 1,000 mL  1,000 mL Per Tube Q24H Clayton Bibles, RD      . fentaNYL (SUBLIMAZE) 2,500 mcg in sodium chloride 0.9 % 250 mL (10 mcg/mL) infusion  10-300 mcg/hr Intravenous Continuous Grace Bushy Minor, NP 10 mL/hr at 07/10/14 0833 100 mcg/hr at 07/10/14 0833  . furosemide (LASIX) 250 mg in dextrose 5 % 250 mL (1 mg/mL) infusion  4 mg/hr Intravenous Continuous Charlynne Cousins, MD 4 mL/hr at 07/09/14 1900 4 mg/hr at 07/09/14 1900  . gi cocktail (Maalox,Lidocaine,Donnatal)  30 mL Oral QID PRN Waldemar Dickens, MD      . heparin injection 5,000 Units  5,000 Units Subcutaneous 3 times per day Marijean Heath, NP   5,000 Units at  07/10/14 0646  . hydrOXYzine (ATARAX/VISTARIL) tablet 25 mg  25 mg Oral TID PRN Jeryl Columbia, NP   25 mg at 07/06/14 2130  . insulin aspart (novoLOG) injection 0-9 Units  0-9 Units Subcutaneous 6 times per day Charlynne Cousins, MD   1 Units at 07/10/14 0356  . insulin detemir (LEVEMIR) injection 20 Units  20 Units Subcutaneous BID Charlynne Cousins, MD   20 Units at 07/09/14 2218  . ipratropium (ATROVENT) nebulizer solution 0.5 mg  0.5 mg Nebulization QID Kara Mead V, MD   0.5 mg at 07/10/14 0718  . levalbuterol (XOPENEX) nebulizer solution 0.63 mg  0.63 mg Nebulization Q2H PRN Chesley Mires, MD      . levalbuterol (XOPENEX) nebulizer solution 0.63  mg  0.63 mg Nebulization QID Kara Mead V, MD   0.63 mg at 07/10/14 0718  . lidocaine (cardiac) 100 mg/10m (XYLOCAINE) 20 MG/ML injection 2%           . milrinone (PRIMACOR) 20 MG/100ML (0.2 mg/mL) infusion  0.125 mcg/kg/min Intravenous Continuous DJolaine Artist MD 4.1 mL/hr at 07/10/14 0210 0.125 mcg/kg/min at 07/10/14 0210  . nitroGLYCERIN (NITROSTAT) SL tablet 0.4 mg  0.4 mg Sublingual Q5 min PRN ACharlynne Cousins MD   0.4 mg at 07/08/14 0820  . norepinephrine (LEVOPHED) 4 mg in dextrose 5 % 250 mL (0.016 mg/mL) infusion  0-40 mcg/min Intravenous Titrated DJolaine Artist MD   Stopped at 07/09/14 1700  . ondansetron (ZOFRAN) injection 4 mg  4 mg Intravenous Q6H PRN VChesley Mires MD      . ondansetron (ZOFRAN) tablet 4 mg  4 mg Oral Q6H PRN ACharlynne Cousins MD      . oxyCODONE-acetaminophen (PERCOCET/ROXICET) 5-325 MG per tablet 1 tablet  1 tablet Oral Q4H PRN DWaldemar Dickens MD   1 tablet at 07/09/14 1245   And  . oxyCODONE (Oxy IR/ROXICODONE) immediate release tablet 5 mg  5 mg Oral Q4H PRN DWaldemar Dickens MD   5 mg at 07/10/14 0336  . pantoprazole (PROTONIX) injection 40 mg  40 mg Intravenous Q24H VChesley Mires MD   40 mg at 07/10/14 0751  . polyethylene glycol (MIRALAX / GLYCOLAX) packet 17 g  17 g Oral Daily ACharlynne Cousins MD   17 g at 07/09/14 0947  . rocuronium (ZEMURON) 50 MG/5ML injection           . sertraline (ZOLOFT) tablet 50 mg  50 mg Oral Daily DWaldemar Dickens MD   50 mg at 07/09/14 0947  . sodium chloride (OCEAN) 0.65 % nasal spray 1 spray  1 spray Each Nare PRN TDianne Dun NP   1 spray at 07/10/14 0356  . sodium chloride 0.9 % injection 10-40 mL  10-40 mL Intracatheter Q12H ACharlynne Cousins MD   10 mL at 07/09/14 2221  . sodium chloride 0.9 % injection 10-40 mL  10-40 mL Intracatheter PRN ACharlynne Cousins MD   10 mL at 07/09/14 0950  . sodium chloride 0.9 % injection 3 mL  3 mL Intravenous Q12H DWaldemar Dickens MD   3 mL at 07/09/14 2221   . succinylcholine (ANECTINE) 20 MG/ML injection           . vitamin B-12 (CYANOCOBALAMIN) tablet 1,000 mcg  1,000 mcg Oral Daily ACharlynne Cousins MD   1,000 mcg at 07/09/14 08563   Physical Exam: General appearance: intubated, sedated on the ventilator Neck: JVD - 3 cm above sternal notch  and no carotid bruit Lungs: diminished breath sounds bilaterally and rales bilaterally Heart: regular rate and rhythm, S3 present and occasional PVC's Abdomen: not distended Extremities: edema trace Pulses: 2+ and symmetric Skin: Skin color, texture, turgor normal. No rashes or lesions Neurologic: Mental status: intubated, sedated on ventilator .  Lab Results: Results for orders placed or performed during the hospital encounter of 06/08/2014 (from the past 48 hour(s))  Glucose, capillary     Status: Abnormal   Collection Time: 07/08/14 10:37 AM  Result Value Ref Range   Glucose-Capillary 199 (H) 70 - 99 mg/dL   Comment 1 Notify RN   Sedimentation rate     Status: Abnormal   Collection Time: 07/08/14 11:34 AM  Result Value Ref Range   Sed Rate 53 (H) 0 - 16 mm/hr  Culture, blood (routine x 2)     Status: None (Preliminary result)   Collection Time: 07/08/14 11:50 AM  Result Value Ref Range   Specimen Description BLOOD LEFT ANTECUBITAL    Special Requests BOTTLES DRAWN AEROBIC ONLY 10CC    Culture             BLOOD CULTURE RECEIVED NO GROWTH TO DATE CULTURE WILL BE HELD FOR 5 DAYS BEFORE ISSUING A FINAL NEGATIVE REPORT Performed at [facility]    Report Status PENDING   Culture, blood (routine x 2)     Status: None (Preliminary result)   Collection Time: 07/08/14 12:00 PM  Result Value Ref Range   Specimen Description BLOOD LEFT ARM    Special Requests BOTTLES DRAWN AEROBIC ONLY 10CC    Culture             BLOOD CULTURE RECEIVED NO GROWTH TO DATE CULTURE WILL BE HELD FOR 5 DAYS BEFORE ISSUING A FINAL NEGATIVE REPORT Performed at [facility]    Report Status PENDING     Carboxyhemoglobin     Status: Abnormal   Collection Time: 07/08/14 12:25 PM  Result Value Ref Range   Total hemoglobin 8.8 (L) 13.5 - 18.0 g/dL   O2 Saturation 77.7 %   Carboxyhemoglobin 1.1 0.5 - 1.5 %   Methemoglobin 0.9 0.0 - 1.5 %  Glucose, capillary     Status: Abnormal   Collection Time: 07/08/14 12:39 PM  Result Value Ref Range   Glucose-Capillary 218 (H) 70 - 99 mg/dL   Comment 1 Document in Chart   Glucose, capillary     Status: Abnormal   Collection Time: 07/08/14  3:48 PM  Result Value Ref Range   Glucose-Capillary 230 (H) 70 - 99 mg/dL   Comment 1 Notify RN   Basic metabolic panel     Status: Abnormal   Collection Time: 07/08/14  8:00 PM  Result Value Ref Range   Sodium 134 (L) 135 - 145 mmol/L   Potassium 4.0 3.5 - 5.1 mmol/L   Chloride 94 (L) 96 - 112 mmol/L   CO2 29 19 - 32 mmol/L   Glucose, Bld 389 (H) 70 - 99 mg/dL   BUN 20 6 - 23 mg/dL   Creatinine, Ser 0.95 0.50 - 1.35 mg/dL   Calcium 8.4 8.4 - 10.5 mg/dL   GFR calc non Af Amer 85 (L) >90 mL/min   GFR calc Af Amer >90 >90 mL/min    Comment: (NOTE) The eGFR has been calculated using the CKD EPI equation. This calculation has not been validated in all clinical situations. eGFR's persistently <90 mL/min signify possible Chronic Kidney Disease.    Anion  gap 11 5 - 15  Glucose, capillary     Status: Abnormal   Collection Time: 07/08/14  8:14 PM  Result Value Ref Range   Glucose-Capillary 356 (H) 70 - 99 mg/dL   Comment 1 Capillary Specimen   Glucose, capillary     Status: Abnormal   Collection Time: 07/08/14 11:49 PM  Result Value Ref Range   Glucose-Capillary 289 (H) 70 - 99 mg/dL   Comment 1 Capillary Specimen   Basic metabolic panel     Status: Abnormal   Collection Time: 07/09/14  3:40 AM  Result Value Ref Range   Sodium 134 (L) 135 - 145 mmol/L   Potassium 3.9 3.5 - 5.1 mmol/L   Chloride 94 (L) 96 - 112 mmol/L   CO2 35 (H) 19 - 32 mmol/L   Glucose, Bld 291 (H) 70 - 99 mg/dL   BUN 21 6 - 23  mg/dL   Creatinine, Ser 0.91 0.50 - 1.35 mg/dL   Calcium 8.4 8.4 - 10.5 mg/dL   GFR calc non Af Amer 87 (L) >90 mL/min   GFR calc Af Amer >90 >90 mL/min    Comment: (NOTE) The eGFR has been calculated using the CKD EPI equation. This calculation has not been validated in all clinical situations. eGFR's persistently <90 mL/min signify possible Chronic Kidney Disease.    Anion gap 5 5 - 15  Magnesium     Status: None   Collection Time: 07/09/14  3:40 AM  Result Value Ref Range   Magnesium 2.0 1.5 - 2.5 mg/dL  Carboxyhemoglobin     Status: Abnormal   Collection Time: 07/09/14  3:40 AM  Result Value Ref Range   Total hemoglobin 9.3 (L) 13.5 - 18.0 g/dL   O2 Saturation 69.3 %   Carboxyhemoglobin 0.9 0.5 - 1.5 %   Methemoglobin 0.8 0.0 - 1.5 %  CBC     Status: Abnormal   Collection Time: 07/09/14  3:40 AM  Result Value Ref Range   WBC 12.1 (H) 4.0 - 10.5 K/uL   RBC 3.17 (L) 4.22 - 5.81 MIL/uL   Hemoglobin 9.1 (L) 13.0 - 17.0 g/dL   HCT 27.7 (L) 39.0 - 52.0 %   MCV 87.4 78.0 - 100.0 fL   MCH 28.7 26.0 - 34.0 pg   MCHC 32.9 30.0 - 36.0 g/dL   RDW 14.8 11.5 - 15.5 %   Platelets 264 150 - 400 K/uL  Glucose, capillary     Status: Abnormal   Collection Time: 07/09/14  4:11 AM  Result Value Ref Range   Glucose-Capillary 249 (H) 70 - 99 mg/dL   Comment 1 Capillary Specimen   Glucose, capillary     Status: Abnormal   Collection Time: 07/09/14  8:04 AM  Result Value Ref Range   Glucose-Capillary 245 (H) 70 - 99 mg/dL  Glucose, capillary     Status: Abnormal   Collection Time: 07/09/14 12:23 PM  Result Value Ref Range   Glucose-Capillary 309 (H) 70 - 99 mg/dL   Comment 1 Capillary Specimen   Glucose, capillary     Status: Abnormal   Collection Time: 07/09/14  4:16 PM  Result Value Ref Range   Glucose-Capillary 339 (H) 70 - 99 mg/dL   Comment 1 Capillary Specimen   Glucose, capillary     Status: Abnormal   Collection Time: 07/09/14  8:43 PM  Result Value Ref Range    Glucose-Capillary 275 (H) 70 - 99 mg/dL   Comment 1 Capillary Specimen  Glucose, capillary     Status: Abnormal   Collection Time: 07/09/14 11:48 PM  Result Value Ref Range   Glucose-Capillary 179 (H) 70 - 99 mg/dL   Comment 1 Capillary Specimen   Glucose, capillary     Status: Abnormal   Collection Time: 07/10/14  3:40 AM  Result Value Ref Range   Glucose-Capillary 127 (H) 70 - 99 mg/dL   Comment 1 Capillary Specimen   Basic metabolic panel     Status: Abnormal   Collection Time: 07/10/14  3:50 AM  Result Value Ref Range   Sodium 136 135 - 145 mmol/L   Potassium 3.7 3.5 - 5.1 mmol/L   Chloride 94 (L) 96 - 112 mmol/L   CO2 29 19 - 32 mmol/L   Glucose, Bld 149 (H) 70 - 99 mg/dL   BUN 26 (H) 6 - 23 mg/dL   Creatinine, Ser 0.96 0.50 - 1.35 mg/dL   Calcium 8.9 8.4 - 10.5 mg/dL   GFR calc non Af Amer 85 (L) >90 mL/min   GFR calc Af Amer >90 >90 mL/min    Comment: (NOTE) The eGFR has been calculated using the CKD EPI equation. This calculation has not been validated in all clinical situations. eGFR's persistently <90 mL/min signify possible Chronic Kidney Disease.    Anion gap 13 5 - 15  Carboxyhemoglobin     Status: Abnormal   Collection Time: 07/10/14  3:50 AM  Result Value Ref Range   Total hemoglobin 9.2 (L) 13.5 - 18.0 g/dL   O2 Saturation 74.9 %   Carboxyhemoglobin 1.0 0.5 - 1.5 %   Methemoglobin 0.7 0.0 - 1.5 %  Phosphorus     Status: None   Collection Time: 07/10/14  3:50 AM  Result Value Ref Range   Phosphorus 2.5 2.3 - 4.6 mg/dL  Magnesium     Status: None   Collection Time: 07/10/14  3:50 AM  Result Value Ref Range   Magnesium 1.7 1.5 - 2.5 mg/dL  I-STAT 3, arterial blood gas (G3+)     Status: Abnormal   Collection Time: 07/10/14  7:08 AM  Result Value Ref Range   pH, Arterial 7.426 7.350 - 7.450   pCO2 arterial 39.9 35.0 - 45.0 mmHg   pO2, Arterial 108.0 (H) 80.0 - 100.0 mmHg   Bicarbonate 26.4 (H) 20.0 - 24.0 mEq/L   TCO2 28 0 - 100 mmol/L   O2  Saturation 98.0 %   Acid-Base Excess 2.0 0.0 - 2.0 mmol/L   Patient temperature 97.7 F    Collection site RADIAL, ALLEN'S TEST ACCEPTABLE    Drawn by RT    Sample type ARTERIAL     Imaging: Dg Chest Port 1 View  07/10/2014   CLINICAL DATA:  Respiratory failure.  Nausea.  EXAM: PORTABLE CHEST - 1 VIEW  COMPARISON:  07/09/2014  FINDINGS: Right-sided PICC line terminates at low SVC. Prior median sternotomy. Midline trachea. Cardiomegaly accentuated by AP portable technique. Small bilateral pleural effusions. No pneumothorax. No change in diffuse interstitial and airspace disease.  IMPRESSION: Similar diffuse interstitial and airspace disease. At least partially felt to be due to pulmonary edema. A component of infection or aspiration could look similar.  Similar small bilateral pleural effusions.   Electronically Signed   By: Abigail Miyamoto M.D.   On: 07/10/2014 09:17   Dg Chest Port 1 View  07/10/2014   CLINICAL DATA:  Status post intubation  EXAM: PORTABLE CHEST - 1 VIEW  COMPARISON:  07/10/2014  FINDINGS: Cardiac shadow  is stable. Diffuse bilateral infiltrative changes are seen. An endotracheal tube is now noted with the catheter tip 4 cm above the carina. A right-sided PICC line is noted in the mid superior vena cava  IMPRESSION: Status post endotracheal intubation with the tube in satisfactory position. The remainder of the exam is stable from the prior study.   Electronically Signed   By: Inez Catalina M.D.   On: 07/10/2014 07:19   Dg Chest Port 1 View  07/09/2014   CLINICAL DATA:  CHF  EXAM: PORTABLE CHEST - 1 VIEW  COMPARISON:  07/08/2014  FINDINGS: Cardiac shadow is again enlarged. Diffuse changes of pulmonary edema are again identified bilaterally. Bilateral pleural effusions are noted. A right-sided PICC line is again noted in the distal superior vena cava. No new focal abnormality is seen.  IMPRESSION: CHF stable from the prior exam.  No acute abnormality is noted.   Electronically Signed   By:  Inez Catalina M.D.   On: 07/09/2014 08:06    Assessment:  Active Problems:   Hypertension   COPD, severe  O2 dependent   Chest pain   Diabetes mellitus type 2, uncontrolled   Acute on chronic systolic heart failure   Femoral neck fracture   Depression with anxiety   Acute respiratory failure with hypoxia   Acute on chronic systolic congestive heart failure   Dyspnea   Cardiogenic shock   ILD (interstitial lung disease)   Plan:  1. Progressive decompensation overnight - now on levophed and milrinone. Co-ox remains high - which is hard to understand given what is felt to be a worsening cardiomyopathy. Would place an arterial line today for more accurate BP measurement. Picture may be more consistent with sepsis. ?whether sedation is contributing to hypotension. No coronary revascularization options. Discussed situation with family - plan to continue diuresis today. Prognosis is guarded - if BP does not improve, may need to consider palliative care again.   CRITICAL CARE:  The patient is critically ill with multi-organ system failure and requires high complexity decision making for assessment and support, frequent evaluation and titration of therapies, application of advanced monitoring technologies and extensive interpretation of multiple databases.  Time Spent Directly with Patient:  45 minutes  Length of Stay:  LOS: 5 days   Pixie Casino, MD, [facility] Attending Cardiologist [facility]  Thuan Tippett C 07/10/2014, 9:48 AM

## 2014-07-10 NOTE — Procedures (Signed)
Arterial Catheter Insertion Procedure Note Huntley DecJames L Tartt [id number] 03-03-1951  Procedure: Insertion of Arterial Catheter  Indications: Blood pressure monitoring and Frequent blood sampling  Procedure Details Consent: Risks of procedure as well as the alternatives and risks of each were explained to the (patient/caregiver).  Consent for procedure obtained. Time Out: Verified patient identification, verified procedure, site/side was marked, verified correct patient position, special equipment/implants available, medications/allergies/relevent history reviewed, required imaging and test results available.  Performed  Maximum sterile technique was used including antiseptics, cap, gloves, gown, hand hygiene, mask and sheet. Skin prep: Chlorhexidine; local anesthetic administered 20 gauge catheter was inserted into right radial artery using the Seldinger technique.  Evaluation Blood flow good; BP tracing good. Complications: No apparent complications.   Durwin GlazeBrown, Dawnielle Christiana N 07/10/2014

## 2014-07-10 NOTE — Progress Notes (Signed)
Patient with chronic systolic HF EF 20%, severe O2 dependent COPD and DM. Has been having rapid a-fib and slow VTs on telemetry. Had significant decompensation overnight required intubation.    Currently on levophed and milrinone. A-line placed. Lasix stopped this morning. Per 2H nurse, patient BP drop everytime he does into slow VT. Unfortunately this patient is prone to VT given underlying severe LV dysfunction and on pressor. Already on IV milrinone. Will discuss with MD, however doubt there is much cardiology can offer. If ventricular arrhythmia persist, may increase amio up to 60 mg/min.    Lung: CTA anterior on vent (despite holding IV lasix)   Need to monitor fluid status closely. Try to wean off levophed as soon as possible. Prognosis poor  Ramond DialSigned, Macky Galik PA Pager: [phone]

## 2014-07-10 NOTE — Progress Notes (Signed)
Pt states some chest pain requesting sublingual nitro, gave one nitro, got an EKG, patient becoming more tachy (130s). Chest pain resolved, then 15 minutes later pt reports his chest hurts when he tries to breathe. Pt in a fib RVR. Called Dr. Duke Salviaandolph. Orders to give bolus of amio. Will continue to monitor.

## 2014-07-11 LAB — BASIC METABOLIC PANEL
Anion gap: 8 (ref 5–15)
BUN: 36 mg/dL — ABNORMAL HIGH (ref 6–23)
CO2: 32 mmol/L (ref 19–32)
Calcium: 8.6 mg/dL (ref 8.4–10.5)
Chloride: 97 mmol/L (ref 96–112)
Creatinine, Ser: 1.18 mg/dL (ref 0.50–1.35)
GFR calc Af Amer: 73 mL/min — ABNORMAL LOW (ref >90)
GFR, EST NON AFRICAN AMERICAN: 63 mL/min — AB (ref >90)
Glucose, Bld: 52 mg/dL — ABNORMAL LOW (ref 70–99)
Potassium: 3.3 mmol/L — ABNORMAL LOW (ref 3.5–5.1)
SODIUM: 137 mmol/L (ref 135–145)

## 2014-07-11 LAB — CBC
HCT: 26.7 % — ABNORMAL LOW (ref 39.0–52.0)
HEMOGLOBIN: 8.7 g/dL — AB (ref 13.0–17.0)
MCH: 28.7 pg (ref 26.0–34.0)
MCHC: 32.6 g/dL (ref 30.0–36.0)
MCV: 88.1 fL (ref 78.0–100.0)
Platelets: 289 10*3/uL (ref 150–400)
RBC: 3.03 MIL/uL — ABNORMAL LOW (ref 4.22–5.81)
RDW: 15.3 % (ref 11.5–15.5)
WBC: 11.9 10*3/uL — ABNORMAL HIGH (ref 4.0–10.5)

## 2014-07-11 LAB — BLOOD GAS, ARTERIAL
Acid-Base Excess: 5 mmol/L — ABNORMAL HIGH (ref 0.0–2.0)
BICARBONATE: 29.7 meq/L — AB (ref 20.0–24.0)
Drawn by: 398991
FIO2: 0.5 %
LHR: 25 {breaths}/min
MECHVT: 600 mL
O2 SAT: 98.3 %
PEEP/CPAP: 12 cmH2O
PH ART: 7.397 (ref 7.350–7.450)
Patient temperature: 98.2
TCO2: 31.2 mmol/L (ref 0–100)
pCO2 arterial: 49.1 mmHg — ABNORMAL HIGH (ref 35.0–45.0)
pO2, Arterial: 105 mmHg — ABNORMAL HIGH (ref 80.0–100.0)

## 2014-07-11 LAB — GLUCOSE, CAPILLARY
GLUCOSE-CAPILLARY: 102 mg/dL — AB (ref 70–99)
GLUCOSE-CAPILLARY: 111 mg/dL — AB (ref 70–99)
GLUCOSE-CAPILLARY: 121 mg/dL — AB (ref 70–99)
GLUCOSE-CAPILLARY: 50 mg/dL — AB (ref 70–99)
GLUCOSE-CAPILLARY: 94 mg/dL (ref 70–99)
Glucose-Capillary: 65 mg/dL — ABNORMAL LOW (ref 70–99)
Glucose-Capillary: 115 mg/dL — ABNORMAL HIGH (ref 70–99)
Glucose-Capillary: 217 mg/dL — ABNORMAL HIGH (ref 70–99)

## 2014-07-11 LAB — CARBOXYHEMOGLOBIN
CARBOXYHEMOGLOBIN: 1 % (ref 0.5–1.5)
CARBOXYHEMOGLOBIN: 1.1 % (ref 0.5–1.5)
METHEMOGLOBIN: 0.7 % (ref 0.0–1.5)
Methemoglobin: 0.9 % (ref 0.0–1.5)
O2 Saturation: 97.8 %
O2 Saturation: 62.7 %
TOTAL HEMOGLOBIN: 8.9 g/dL — AB (ref 13.5–18.0)
TOTAL HEMOGLOBIN: 12.3 g/dL — AB (ref 13.5–18.0)

## 2014-07-11 LAB — PROCALCITONIN: Procalcitonin: 2.87 ng/mL

## 2014-07-11 LAB — MAGNESIUM
Magnesium: 1.7 mg/dL (ref 1.5–2.5)
Magnesium: 1.6 mg/dL (ref 1.5–2.5)

## 2014-07-11 LAB — PHOSPHORUS: PHOSPHORUS: 3.8 mg/dL (ref 2.3–4.6)

## 2014-07-11 MED ORDER — ATORVASTATIN CALCIUM 40 MG PO TABS
80.0000 mg | ORAL_TABLET | Freq: Every day | ORAL | Status: DC
  Administered 2014-07-11 – 2014-07-12 (×2): 80 mg
  Filled 2014-07-11 (×2): qty 2

## 2014-07-11 MED ORDER — VITAL 1.5 CAL PO LIQD
1000.0000 mL | ORAL | Status: DC
  Administered 2014-07-11: 1000 mL
  Filled 2014-07-11 (×4): qty 1000

## 2014-07-11 MED ORDER — MAGNESIUM SULFATE 4 GM/100ML IV SOLN
4.0000 g | Freq: Once | INTRAVENOUS | Status: AC
  Administered 2014-07-11: 4 g via INTRAVENOUS
  Filled 2014-07-11: qty 100

## 2014-07-11 MED ORDER — PANTOPRAZOLE SODIUM 40 MG PO PACK
40.0000 mg | PACK | Freq: Every day | ORAL | Status: DC
  Administered 2014-07-12: 40 mg
  Filled 2014-07-11: qty 20

## 2014-07-11 MED ORDER — DEXTROSE 50 % IV SOLN
INTRAVENOUS | Status: AC
  Administered 2014-07-11: 50 mL
  Filled 2014-07-11: qty 50

## 2014-07-11 MED ORDER — FUROSEMIDE 10 MG/ML IJ SOLN
40.0000 mg | Freq: Once | INTRAMUSCULAR | Status: AC
  Administered 2014-07-11: 40 mg via INTRAVENOUS
  Filled 2014-07-11: qty 4

## 2014-07-11 MED ORDER — FUROSEMIDE 10 MG/ML IJ SOLN
40.0000 mg | Freq: Two times a day (BID) | INTRAMUSCULAR | Status: DC
  Administered 2014-07-11: 40 mg via INTRAVENOUS
  Filled 2014-07-11 (×3): qty 4

## 2014-07-11 MED ORDER — ALPRAZOLAM 0.25 MG PO TABS
0.2500 mg | ORAL_TABLET | Freq: Two times a day (BID) | ORAL | Status: DC | PRN
  Administered 2014-07-11 – 2014-07-12 (×2): 0.25 mg via ORAL
  Filled 2014-07-11 (×4): qty 1

## 2014-07-11 MED ORDER — METHYLPREDNISOLONE SODIUM SUCC 125 MG IJ SOLR
60.0000 mg | Freq: Four times a day (QID) | INTRAMUSCULAR | Status: DC
  Administered 2014-07-11 – 2014-07-12 (×6): 60 mg via INTRAVENOUS
  Filled 2014-07-11: qty 0.96
  Filled 2014-07-11 (×2): qty 2
  Filled 2014-07-11 (×2): qty 0.96
  Filled 2014-07-11 (×2): qty 2
  Filled 2014-07-11: qty 0.96

## 2014-07-11 MED ORDER — DEXTROSE 50 % IV SOLN
25.0000 mL | Freq: Once | INTRAVENOUS | Status: AC
  Administered 2014-07-11: 25 mL via INTRAVENOUS

## 2014-07-11 MED ORDER — CLOPIDOGREL 45 MG/30 ML ORAL SUSPENSION: 75.0000 mg | Freq: Every day | ORAL | Status: DC

## 2014-07-11 MED ORDER — ACETAMINOPHEN 160 MG/5ML PO SOLN
650.0000 mg | Freq: Four times a day (QID) | ORAL | Status: DC | PRN
  Administered 2014-07-11: 650 mg via ORAL
  Filled 2014-07-11: qty 20.3

## 2014-07-11 MED ORDER — ASPIRIN 325 MG PO TABS
325.0000 mg | ORAL_TABLET | Freq: Every day | ORAL | Status: DC
  Filled 2014-07-11: qty 1

## 2014-07-11 MED ORDER — POTASSIUM CHLORIDE 20 MEQ/15ML (10%) PO SOLN
40.0000 meq | ORAL | Status: AC
  Administered 2014-07-11 (×2): 40 meq
  Filled 2014-07-11 (×4): qty 30

## 2014-07-11 MED ORDER — DIGOXIN 125 MCG PO TABS
0.1250 mg | ORAL_TABLET | Freq: Every day | ORAL | Status: DC
  Administered 2014-07-12: 0.125 mg
  Filled 2014-07-11: qty 1

## 2014-07-11 MED ORDER — IPRATROPIUM BROMIDE 0.02 % IN SOLN: 0.5000 mg | RESPIRATORY_TRACT | Status: DC | PRN

## 2014-07-11 MED ORDER — CLOPIDOGREL BISULFATE 75 MG PO TABS
75.0000 mg | ORAL_TABLET | Freq: Every day | ORAL | Status: DC
  Administered 2014-07-12: 75 mg
  Filled 2014-07-11 (×2): qty 1

## 2014-07-11 MED ORDER — DEXTROSE 50 % IV SOLN
INTRAVENOUS | Status: AC
  Administered 2014-07-11: 25 mL via INTRAVENOUS
  Filled 2014-07-11: qty 50

## 2014-07-11 MED ORDER — PRO-STAT SUGAR FREE PO LIQD
30.0000 mL | Freq: Three times a day (TID) | ORAL | Status: DC
Start: 2014-07-11 — End: 2014-07-12
  Administered 2014-07-11 – 2014-07-12 (×4): 30 mL
  Filled 2014-07-11 (×5): qty 30

## 2014-07-11 NOTE — Progress Notes (Signed)
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  Was d/c and returned 3/29 with c/o SOB and chest tightness, lightheadedness.  Admitted by [facility] and treated for acute on chronic sCHF with diuresis.  On 3/30 pt developed worsening SOB, hypotension and PCCM consulted.   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) , reports exposure to silica  STUDIES:  CTA chest 3/29 >>> pulmonary edema with chronic interstitial changes  SIGNIFICANT EVENTS: 4/01 Add milrinone 4/03 intubated  SUBJECTIVE:  Remains on increased PEEP/FiO2  VITAL SIGNS: Temp:  [97.8 F (36.6 C)-98.5 F (36.9 C)] 98.2 F (36.8 C) (04/04 0803) Pulse Rate:  [46-109] 103 (04/04 0950) Resp:  [0-28] 25 (04/04 0914) BP: (70-132)/(28-72) 116/51 mmHg (04/04 0914) SpO2:  [99 %-100 %] 100 % (04/04 0914) Arterial Line BP: (102-139)/(46-65) 108/51 mmHg (04/04 0600) FiO2 (%):  [50 %-100 %] 50 % (04/04 0914) Weight:  [240 lb 1.3 oz (108.9 kg)] 240 lb 1.3 oz (108.9 kg) (04/04 0500) HEMODYNAMICS: CVP:  [11 mmHg-15 mmHg] 15 mmHg VENTILATOR SETTINGS: Vent Mode:  [-] PRVC FiO2 (%):  [50 %-100 %] 50 % Set Rate:  [25 bmp] 25 bmp Vt Set:  [600 mL] 600 mL PEEP:  [10 cmH20] 10 cmH20 Plateau Pressure:  [25 cmH20-29 cmH20] 28 cmH20 INTAKE / OUTPUT:  Intake/Output Summary (Last 24 hours) at 07/11/14 1014 Last data filed at 07/11/14 0600  Gross per 24 hour  Intake   2119 ml  Output    520 ml  Net   1599 ml    PHYSICAL EXAMINATION: General: no distress Neuro: RASS -1 HEENT:  ETT in place Cardiovascular: regular, tachycardic Lungs: b/l  crackles Abdomen:  Soft, non tender Musculoskeletal:  No edema  LABS:  CBC  Recent Labs Lab 07/08/14 0600 07/09/14 0340 07/11/14 0500  WBC 16.4* 12.1* 11.9*  HGB 10.0* 9.1* 8.7*  HCT 30.5* 27.7* 26.7*  PLT 303 264 289   Coag's  Recent Labs Lab 06/22/2014 1644  INR 1.06   BMET  Recent Labs Lab 07/09/14 0340 07/10/14 0350 07/11/14 0500  NA 134* 136 137  K 3.9 3.7 3.3*  CL 94* 94* 97  CO2 35* 29 32  BUN 21 26* 36*  CREATININE 0.91 0.96 1.18  GLUCOSE 291* 149* 52*   Electrolytes  Recent Labs Lab 07/08/14 0600  07/09/14 0340 07/10/14 0350 07/11/14 0500  CALCIUM 8.8  < > 8.4 8.9 8.6  MG 1.3*  --  2.0 1.7 1.7  PHOS 3.8  --   --  2.5 3.8  < > = values in this interval not displayed. Sepsis Markers  Recent Labs Lab 07/06/14 1326 07/06/14 1328 07/07/14 0308 07/08/14 0600  LATICACIDVEN  --  1.8  --   --   PROCALCITON 0.14  --  <0.10 <0.10   ABG  Recent Labs Lab 07/06/14 1714 07/10/14 0708 07/11/14 0928  PHART 7.468* 7.426 7.397  PCO2ART 39.8 39.9 49.1*  PO2ART 81.0 108.0* 105.0*   Liver Enzymes  Recent Labs Lab 07/06/14 0436  AST 13  ALT 9  ALKPHOS 99  BILITOT 0.6  ALBUMIN 3.2*  Cardiac Enzymes  Recent Labs Lab 07/06/14 1326 07/06/14 2009 07/07/14 0045  TROPONINI 0.15* 0.13* 0.10*   Glucose  Recent Labs Lab 07/10/14 2025 07/10/14 2355 07/11/14 0451 07/11/14 0519 07/11/14 0850 07/11/14 0949  GLUCAP 100* 94 50* 102* 65* 121*    Imaging Dg Chest Port 1 View  07/10/2014   CLINICAL DATA:  Respiratory failure.  Nausea.  EXAM: PORTABLE CHEST - 1 VIEW  COMPARISON:  07/09/2014  FINDINGS: Right-sided PICC line terminates at low SVC. Prior median sternotomy. Midline trachea. Cardiomegaly accentuated by AP portable technique. Small bilateral pleural effusions. No pneumothorax. No change in diffuse interstitial and airspace disease.  IMPRESSION: Similar diffuse interstitial and airspace disease. At least partially felt to be due to  pulmonary edema. A component of infection or aspiration could look similar.  Similar small bilateral pleural effusions.   Electronically Signed   By: Abigail Miyamoto M.D.   On: 07/10/2014 09:17   Dg Chest Port 1 View  07/10/2014   CLINICAL DATA:  Status post intubation  EXAM: PORTABLE CHEST - 1 VIEW  COMPARISON:  07/10/2014  FINDINGS: Cardiac shadow is stable. Diffuse bilateral infiltrative changes are seen. An endotracheal tube is now noted with the catheter tip 4 cm above the carina. A right-sided PICC line is noted in the mid superior vena cava  IMPRESSION: Status post endotracheal intubation with the tube in satisfactory position. The remainder of the exam is stable from the prior study.   Electronically Signed   By: Inez Catalina M.D.   On: 07/10/2014 07:19   Dg Abd Portable 1v  07/10/2014   CLINICAL DATA:  Nasojejunal tube placement  EXAM: PORTABLE ABDOMEN - 1 VIEW  COMPARISON:  07/10/2014  FINDINGS: Normal bowel gas pattern. Left basilar consolidation partly visualized. Median sternotomy wires are again noted with discontinuity of the most inferior wire again noted. Cholecystectomy clips are noted. Nasogastric tube tip terminates over the expected location of the distal stomach.  IMPRESSION: Feeding tube tip over the expected location of the distal stomach.   Electronically Signed   By: Conchita Paris M.D.   On: 07/10/2014 10:49     ASSESSMENT / PLAN:  PULMONARY ETT 4/03 >> A: Acute on chronic respiratory failure 2nd to pulmonary edema and ILD >> ESR elevated from 4/01. P:   Full vent support Adjust PEEP/FiO2 to keep SpO2 88 to 95% F/u CXR PRN BD's  CARDIOVASCULAR A: Acute on chronic systolic CHF >> baseline EF 20 to 25%. Hx of CAD s/p CABG. Cardiogenic shock. P:  Continue ASA, lipitor, plavix, digoxin Milrinone per cardiology Diuresis per cardiology  RENAL A: Hypokalemia. P:   Monitor renal fx Replace electrolytes as needed  GASTROINTESTINAL A: Nutrition. P:   Tube feeds  while on vent Protonix for SUP  HEMATOLOGIC A: Leukocytosis. Anemia of critical illness and chronic disease. P:  F/u CBC SQ heparin for DVT prophylaxis  INFECTIOUS A: Concern for HCAP >> less likely given low procalcitonin. Coag neg staph in blood cx from 3/30 ?real vs contaminate. P:   Monitor off Abx  Blood 3/30 >> coag neg staph bc 4/1>>  ENDOCRINE A: DM with steroid induced hyperglycemia. P:   SSI, lantus   NEUROLOGIC A: Acute encephalopathy. Deconditioning. P:   RASS goal -1  ORTHO  A: L Hip fx  P:  PT/OT when stable   Goals of care >> he was previously DNR with palliative care.  Family requested to change to full code.  Will re-address depending on ability to wean  vent over next few days.  CC time 40 minutes.  Updated pt's family at bedside.  Chesley Mires, MD [facility] Pulmonary/Critical Care 07/11/2014, 10:22 AM Pager:  [phone] After 3pm call: [phone]

## 2014-07-11 NOTE — Progress Notes (Signed)
NUTRITION FOLLOW-UP  INTERVENTION:   D/C Vital High Protein  Initiate Vital 1.5 @ 55 ml/hr via OG tube   30 ml Prostat TID.    Tube feeding regimen provides 2289 kcal (100% of needs), 134 grams of protein, and 1003 ml of H2O.   NUTRITION DIAGNOSIS: Inadequate oral intake related to hx of decreased appetite as evidenced by reported hx; ongoing.   Goal: Pt will meet >90% of estimated nutritional needs; not met.    Monitor:  Vent status, TF adequacy/tolerance, labs, weight changes, I/O's  ASSESSMENT: Pt admitted from [facility] for acute on chronic systolic heart failure. He was recently d/c from [facility] on 06/27/14 for lt hip fx (pt was consuming 50-100% of meals during previous admission).   Pt intubated 4/3 and started on TF protocol.  Vital High Protein @ 40 ml/hr provides: 960 kcal and 84 grams protein  MV: 15.3 L/min Temp (24hrs), Avg:98.2 F (36.8 C), Min:97.8 F (36.6 C), Max:98.5 F (36.9 C)  Potassium low CBGs: 50-121  Height: Ht Readings from Last 1 Encounters:  07/06/14 6' 3"  (1.905 m)  Admission weight: 237 lb (107.5 kg) 3/29 Admission BMI: 29.7  Weight: Wt Readings from Last 1 Encounters:  07/11/14 240 lb 1.3 oz (108.9 kg)    BMI:  Body mass index is 30.01 kg/(m^2).   Estimated Nutritional Needs: Kcal: 2296 Protein: 125-140 grams Fluid: >/= 2 L/day  Skin: closed lt hip incision  Diet Order:  NPO   Intake/Output Summary (Last 24 hours) at 07/11/14 1353 Last data filed at 07/11/14 0600  Gross per 24 hour  Intake 1782.4 ml  Output    520 ml  Net 1262.4 ml    Last BM: 07/04/14  Labs:   Recent Labs Lab 07/08/14 0600  07/09/14 0340 07/10/14 0350 07/11/14 0500 07/11/14 1000  NA 138  < > 134* 136 137  --   K 3.8  < > 3.9 3.7 3.3*  --   CL 99  < > 94* 94* 97  --   CO2 32  < > 35* 29 32  --   BUN 17  < > 21 26* 36*  --   CREATININE 0.78  < > 0.91 0.96 1.18  --   CALCIUM 8.8  < > 8.4 8.9 8.6  --   MG 1.3*  --  2.0 1.7 1.7 1.6  PHOS 3.8  --    --  2.5 3.8  --   GLUCOSE 197*  < > 291* 149* 52*  --   < > = values in this interval not displayed.  CBG (last 3)   Recent Labs  07/11/14 0850 07/11/14 0949 07/11/14 1055  GLUCAP 65* 121* 111*   Lab Results  Component Value Date   HGBA1C 10.8* 06/25/2014   Scheduled Meds: . antiseptic oral rinse  7 mL Mouth Rinse QID  . [START ON 07/12/2014] aspirin  325 mg Per Tube Daily  . atorvastatin  80 mg Per Tube q1800  . chlorhexidine  15 mL Mouth Rinse BID  . [START ON 07/12/2014] clopidogrel  75 mg Per Tube Q breakfast  . [START ON 07/12/2014] digoxin  0.125 mg Per Tube Daily  . feeding supplement (VITAL HIGH PROTEIN)  1,000 mL Per Tube Q24H  . furosemide  40 mg Intravenous BID  . heparin subcutaneous  5,000 Units Subcutaneous 3 times per day  . insulin aspart  0-9 Units Subcutaneous 6 times per day  . insulin detemir  20 Units Subcutaneous BID  . methylPREDNISolone (  SOLU-MEDROL) injection  60 mg Intravenous Q6H  . [START ON 07/12/2014] pantoprazole sodium  40 mg Per Tube Daily  . polyethylene glycol  17 g Oral Daily  . sodium chloride  10-40 mL Intracatheter Q12H  . sodium chloride  3 mL Intravenous Q12H  . vitamin B-12  1,000 mcg Oral Daily    Continuous Infusions: . amiodarone 30 mg/hr (07/11/14 0455)  . fentaNYL infusion INTRAVENOUS 200 mcg/hr (07/11/14 0455)  . milrinone 0.125 mcg/kg/min (07/11/14 0455)  . norepinephrine (LEVOPHED) Adult infusion Stopped (07/11/14 0230)   [place], LDN, CNSC [phone] Pager [phone] After Hours Pager

## 2014-07-11 NOTE — Progress Notes (Signed)
Results for [name], Vernel L (MRN [id number]) as of 07/11/2014 10:27  Ref. Range 07/10/2014 23:55 07/11/2014 04:51 07/11/2014 05:19 07/11/2014 08:50 07/11/2014 09:49  Glucose-Capillary Latest Range: 70-99 mg/dL 94 50 (L) 829102 (H) 65 (L) 121 (H)  Noted that patient had 2 CBGs less than 70 mg/dl.  Recommend decreasing Levemir to 15 units BID if CBGs continue to be less than 70 mg/dl. Continue Novolog SENSITIVE correction scale every 4 hours due to continuous tube feedings. Will continue to follow while in hospital. Smith MinceKendra Marisela Line RN BSN CDE

## 2014-07-11 NOTE — Progress Notes (Signed)
Occupational Therapy Discharge Patient Details Name: Timothy Trujillo MRN: [id number] DOB: 03-11-1951 Today's Date: 07/11/2014 Time:  -     Patient discharged from OT services secondary to medical decline - will need to re-order OT to resume therapy services.  Please see latest therapy progress note for current level of functioning and progress toward goals.     GO     Angelene GiovanniConarpe, [place] Cu M  Madilyn Cephas [facility] [place], OTR/L [phone]  07/11/2014, 11:03 AM

## 2014-07-11 NOTE — Progress Notes (Signed)
Hypoglycemic Event  CBG: 50  Treatment: D50 IV 25 mL  Symptoms: None  Follow-up CBG: Time:0515 CBG Result:102    Possible Reasons for Event: Medication regimen: 20 units levamir given at hs  Comments/MD notified: protocol followed    Timothy Trujillo, [place] L  Remember to initiate Hypoglycemia Order Set & completeAdult (Non-Pregnant) Hypoglycemia Protocol Treatment Guidelines

## 2014-07-11 NOTE — Progress Notes (Signed)
Advanced Heart Failure Rounding Note   Subjective:    Admitted with cardiogenic shock with CO-OX 33%. Started on milrinone with levophed added.  Developed respiratory distress and he was intubated on 4/3. Having VT yesterday with amiodarone drip started. No further VT noted. Last night norepinephrine was stopped and he remains on milrinone at 0.125 mcg. This am hypoglycemic and received amp of D50.   CO-OX 62% Creatinine 1.18 Hgb 9.1>8.7   Objective:   Weight Range:  Vital Signs:   Temp:  [97.8 F (36.6 C)-100.6 F (38.1 C)] 97.8 F (36.6 C) (04/04 0400) Pulse Rate:  [46-117] 93 (04/04 0600) Resp:  [0-29] 19 (04/04 0600) BP: (67-132)/(25-77) 73/56 mmHg (04/04 0600) SpO2:  [97 %-100 %] 100 % (04/04 0600) Arterial Line BP: (102-139)/(46-65) 108/51 mmHg (04/04 0600) FiO2 (%):  [50 %-100 %] 50 % (04/04 0400) Weight:  [240 lb 1.3 oz (108.9 kg)] 240 lb 1.3 oz (108.9 kg) (04/04 0500) Last BM Date: 07/04/14  Weight change: Filed Weights   07/09/14 0413 07/10/14 0359 07/11/14 0500  Weight: 236 lb 15.9 oz (107.5 kg) 237 lb 14 oz (107.9 kg) 240 lb 1.3 oz (108.9 kg)    Intake/Output:   Intake/Output Summary (Last 24 hours) at 07/11/14 0716 Last data filed at 07/11/14 0600  Gross per 24 hour  Intake 2409.1 ml  Output    670 ml  Net 1739.1 ml     Physical Exam: CVP 12 General: Intubated. No resp difficulty HEENT: normal Neck: supple. JVP 9-10 . Carotids 2+ bilat; no bruits. No lymphadenopathy or thryomegaly appreciated. Cor: PMI nondisplaced. Regular rate & rhythm. No rubs, gallops or murmurs. Lungs: clear Abdomen: soft, nontender, nondistended. No hepatosplenomegaly. No bruits or masses. Good bowel sounds. Extremities: no cyanosis, clubbing, rash, edema RUE A line R and LLE no edema  Neuro: Intubated but arousable   Telemetry: NSR 90s   Labs: Basic Metabolic Panel:  Recent Labs Lab July 10, 2014 2050  07/08/14 0600 07/08/14 2000 07/09/14 0340 07/10/14 0350  07/11/14 0500  NA  --   < > 138 134* 134* 136 137  K  --   < > 3.8 4.0 3.9 3.7 3.3*  CL  --   < > 99 94* 94* 94* 97  CO2  --   < > 32 29 35* 29 32  GLUCOSE  --   < > 197* 389* 291* 149* 52*  BUN  --   < > 26* 36*  CREATININE  --   < > 0.78 0.95 0.91 0.96 1.18  CALCIUM  --   < > 8.8 8.4 8.4 8.9 8.6  MG 1.1*  --  1.3*  --  2.0 1.7 1.7  PHOS  --   --  3.8  --   --  2.5 3.8  < > = values in this interval not displayed.  Liver Function Tests:  Recent Labs Lab 07/06/14 0436  AST 13  ALT 9  ALKPHOS 99  BILITOT 0.6  PROT 6.8  ALBUMIN 3.2*   No results for input(s): LIPASE, AMYLASE in the last 168 hours. No results for input(s): AMMONIA in the last 168 hours.  CBC:  Recent Labs Lab 2014-07-10 1644 07/06/14 0436 07/07/14 0937 07/08/14 0600 07/09/14 0340 07/11/14 0500  WBC 12.5* 12.8* 15.7* 16.4* 12.1* 11.9*  NEUTROABS 10.3*  --   --   --   --   --   HGB 10.3* 11.2* 10.2* 10.0* 9.1* 8.7*  HCT 31.8* 35.0* 31.4* 30.5* 27.7* 26.7*  MCV 89.1 90.2 89.7 88.4 87.4 88.1  PLT 269 287 282 303 264 289    Cardiac Enzymes:  Recent Labs Lab 07/06/14 0215 07/06/14 0436 07/06/14 1326 07/06/14 2009 07/07/14 0045  TROPONINI 0.15* 0.17* 0.15* 0.13* 0.10*    BNP: BNP (last 3 results)  Recent Labs  05/06/14 2355 06/04/14 0325 31-Jul-2014 1644  BNP 646.0* 531.0* 934.0*    ProBNP (last 3 results)  Recent Labs  07/30/13 1523 02/25/14 2200  PROBNP 604.2* 1014.0*      Other results:  Imaging: Dg Chest Port 1 View  07/10/2014   CLINICAL DATA:  Respiratory failure.  Nausea.  EXAM: PORTABLE CHEST - 1 VIEW  COMPARISON:  07/09/2014  FINDINGS: Right-sided PICC line terminates at low SVC. Prior median sternotomy. Midline trachea. Cardiomegaly accentuated by AP portable technique. Small bilateral pleural effusions. No pneumothorax. No change in diffuse interstitial and airspace disease.  IMPRESSION: Similar diffuse interstitial and airspace disease. At least partially felt  to be due to pulmonary edema. A component of infection or aspiration could look similar.  Similar small bilateral pleural effusions.   Electronically Signed   By: Jeronimo Greaves M.D.   On: 07/10/2014 09:17   Dg Chest Port 1 View  07/10/2014   CLINICAL DATA:  Status post intubation  EXAM: PORTABLE CHEST - 1 VIEW  COMPARISON:  07/10/2014  FINDINGS: Cardiac shadow is stable. Diffuse bilateral infiltrative changes are seen. An endotracheal tube is now noted with the catheter tip 4 cm above the carina. A right-sided PICC line is noted in the mid superior vena cava  IMPRESSION: Status post endotracheal intubation with the tube in satisfactory position. The remainder of the exam is stable from the prior study.   Electronically Signed   By: Alcide Clever M.D.   On: 07/10/2014 07:19   Dg Abd Portable 1v  07/10/2014   CLINICAL DATA:  Nasojejunal tube placement  EXAM: PORTABLE ABDOMEN - 1 VIEW  COMPARISON:  07/10/2014  FINDINGS: Normal bowel gas pattern. Left basilar consolidation partly visualized. Median sternotomy wires are again noted with discontinuity of the most inferior wire again noted. Cholecystectomy clips are noted. Nasogastric tube tip terminates over the expected location of the distal stomach.  IMPRESSION: Feeding tube tip over the expected location of the distal stomach.   Electronically Signed   By: Christiana Pellant M.D.   On: 07/10/2014 10:49      Medications:     Scheduled Medications: . antiseptic oral rinse  7 mL Mouth Rinse QID  . aspirin  325 mg Oral Daily  . atorvastatin  80 mg Oral q1800  . chlorhexidine  15 mL Mouth Rinse BID  . clopidogrel  75 mg Oral Daily  . digoxin  0.125 mg Oral Daily  . feeding supplement (GLUCERNA SHAKE)  237 mL Oral TID BM  . feeding supplement (VITAL HIGH PROTEIN)  1,000 mL Per Tube Q24H  . heparin subcutaneous  5,000 Units Subcutaneous 3 times per day  . insulin aspart  0-9 Units Subcutaneous 6 times per day  . insulin detemir  20 Units Subcutaneous BID   . ipratropium  0.5 mg Nebulization QID  . levalbuterol  0.63 mg Nebulization QID  . pantoprazole (PROTONIX) IV  40 mg Intravenous Q24H  . polyethylene glycol  17 g Oral Daily  . sodium chloride  10-40 mL Intracatheter Q12H  . sodium chloride  3 mL Intravenous Q12H  . vitamin B-12  1,000 mcg Oral Daily     Infusions: . amiodarone 30 mg/hr (  07/11/14 0455)  . fentaNYL infusion INTRAVENOUS 200 mcg/hr (07/11/14 0455)  . milrinone 0.125 mcg/kg/min (07/11/14 0455)  . norepinephrine (LEVOPHED) Adult infusion Stopped (07/11/14 0230)     PRN Medications:  Place/Maintain arterial line **AND** sodium chloride, [DISCONTINUED] acetaminophen **OR** acetaminophen, acetaminophen, ALPRAZolam, gi cocktail, hydrOXYzine, levalbuterol, nitroGLYCERIN, ondansetron, ondansetron, oxyCODONE-acetaminophen **AND** oxyCODONE, sodium chloride, sodium chloride   Assessment:  1.  Cardiogenic Shock - CO-OX 33% 2.  ILD: severe, O2 dependent. S/P VATs with biopsy 2012.  3.  Chest pain 4.  Diabetes mellitus type 2, uncontrolled 5.  Acute on chronic systolic heart failure 6.  Femoral neck fracture 7.  Depression with anxiety 8.  Acute respiratory failure with hypoxia 9.  Acute on chronic systolic congestive heart failure- ECHO 05/08/2014 EF 25-30%.  Ischemic CMP. 10.  Dyspnea   11.       Hypoglycemia.    12.       CAD s/p CABG  Plan/Discussion:   He remains a full code.   Respiratory decompensation 4/3 and intubated.  He has baseline ILD.  Afebrile.  Procalcitonin not high 4/1 so infection less likely, not on antibiotics.  Will repeat procalcitonin.  Not on steroids at this point either.  Concerned that decompensation due to pulmonary edema. CVP 12.  Will give Lasix 40 mg IV bid today (BP stable).   No further VT overnight. NSR on amiodarone 30 mg/hr. Long term would not use amiodarone with ILD would favor Toprol XL.   Off norepinephrine and on low dose milrinone 0.125 mcg/kg/min with adequate CO-OX  (62%) this am. Consider stopping tomorrow.  Mag 1.7 will give 4 grams Mag now. Supplement potassium. Continue digoxin.   Watch hgb down to 8.7   WBC coming down a little. 16>12.1>11.9. Currently not on antibiotics or steroids.   Length of Stay: 6   CLEGG,AMY NP-C  07/11/2014, 7:16 AM  Advanced Heart Failure Team Pager [phone] (M-F; 7a - 4p)  Please contact [facility] for night-coverage after hours (4p -7a ) and weekends on [email or web address]  Patient seen with NP, agree with the above note.  Respiratory decompensation with intubation yesterday.  CVP 12.  Baseline ILD and no definite evidence for infection, must treat as pulmonary edema.  BP stable off norepinephrine.  Will give Lasix as above.  Continue low dose milrinone aiming to titrate off.   Marca AnconaDalton Yomayra Tate 07/11/2014 8:00 AM

## 2014-07-12 ENCOUNTER — Inpatient Hospital Stay (HOSPITAL_COMMUNITY): Payer: Commercial Managed Care - HMO

## 2014-07-12 LAB — BASIC METABOLIC PANEL
ANION GAP: 8 (ref 5–15)
BUN: 46 mg/dL — ABNORMAL HIGH (ref 6–23)
CALCIUM: 8.5 mg/dL (ref 8.4–10.5)
CHLORIDE: 95 mmol/L — AB (ref 96–112)
CO2: 27 mmol/L (ref 19–32)
Creatinine, Ser: 1.24 mg/dL (ref 0.50–1.35)
GFR calc Af Amer: 69 mL/min — ABNORMAL LOW (ref >90)
GFR calc non Af Amer: 59 mL/min — ABNORMAL LOW (ref >90)
Glucose, Bld: 312 mg/dL — ABNORMAL HIGH (ref 70–99)
Potassium: 4.2 mmol/L (ref 3.5–5.1)
SODIUM: 130 mmol/L — AB (ref 135–145)

## 2014-07-12 LAB — GLUCOSE, CAPILLARY
GLUCOSE-CAPILLARY: 290 mg/dL — AB (ref 70–99)
GLUCOSE-CAPILLARY: 325 mg/dL — AB (ref 70–99)
GLUCOSE-CAPILLARY: 343 mg/dL — AB (ref 70–99)
Glucose-Capillary: 247 mg/dL — ABNORMAL HIGH (ref 70–99)

## 2014-07-12 LAB — IRON AND TIBC
IRON: 31 ug/dL — AB (ref 42–165)
Saturation Ratios: 12 % — ABNORMAL LOW (ref 20–55)
TIBC: 257 ug/dL (ref 215–435)
UIBC: 226 ug/dL (ref 125–400)

## 2014-07-12 LAB — CARBOXYHEMOGLOBIN
Carboxyhemoglobin: 1.1 % (ref 0.5–1.5)
Methemoglobin: 0.7 % (ref 0.0–1.5)
O2 Saturation: 60.2 %
Total hemoglobin: 8 g/dL — ABNORMAL LOW (ref 13.5–18.0)

## 2014-07-12 LAB — CBC
HCT: 25.1 % — ABNORMAL LOW (ref 39.0–52.0)
Hemoglobin: 8 g/dL — ABNORMAL LOW (ref 13.0–17.0)
MCH: 28.3 pg (ref 26.0–34.0)
MCHC: 31.9 g/dL (ref 30.0–36.0)
MCV: 88.7 fL (ref 78.0–100.0)
PLATELETS: 287 10*3/uL (ref 150–400)
RBC: 2.83 MIL/uL — ABNORMAL LOW (ref 4.22–5.81)
RDW: 14.9 % (ref 11.5–15.5)
WBC: 9.6 10*3/uL (ref 4.0–10.5)

## 2014-07-12 LAB — FERRITIN: FERRITIN: 642 ng/mL — AB (ref 22–322)

## 2014-07-12 LAB — PROCALCITONIN: Procalcitonin: 1.67 ng/mL

## 2014-07-12 LAB — MAGNESIUM: MAGNESIUM: 2.2 mg/dL (ref 1.5–2.5)

## 2014-07-12 MED ORDER — LORAZEPAM 2 MG/ML IJ SOLN
INTRAMUSCULAR | Status: AC
  Administered 2014-07-12: 2 mg via INTRAVENOUS
  Filled 2014-07-12: qty 1

## 2014-07-12 MED ORDER — DEXTROSE 5 % IV SOLN
1.0000 mg/h | INTRAVENOUS | Status: DC
  Administered 2014-07-12: 2 mg/h via INTRAVENOUS
  Filled 2014-07-12: qty 10

## 2014-07-12 MED ORDER — LORAZEPAM 2 MG/ML IJ SOLN
INTRAMUSCULAR | Status: AC
  Filled 2014-07-12: qty 1

## 2014-07-12 MED ORDER — LORAZEPAM 2 MG/ML IJ SOLN
1.0000 mg | Freq: Once | INTRAMUSCULAR | Status: AC
  Administered 2014-07-12: 1 mg via INTRAVENOUS

## 2014-07-12 MED ORDER — MORPHINE BOLUS VIA INFUSION
2.0000 mg | INTRAVENOUS | Status: DC | PRN
  Administered 2014-07-12: 2 mg via INTRAVENOUS
  Filled 2014-07-12 (×2): qty 4

## 2014-07-12 MED ORDER — LORAZEPAM 2 MG/ML IJ SOLN
2.0000 mg | INTRAMUSCULAR | Status: DC | PRN
  Administered 2014-07-12: 2 mg via INTRAVENOUS

## 2014-07-12 MED ORDER — ASPIRIN 81 MG PO CHEW
81.0000 mg | CHEWABLE_TABLET | Freq: Every day | ORAL | Status: DC
  Administered 2014-07-12: 81 mg
  Filled 2014-07-12: qty 1

## 2014-07-12 MED ORDER — LORAZEPAM 2 MG/ML IJ SOLN
2.0000 mg | INTRAMUSCULAR | Status: DC | PRN
  Administered 2014-07-14 (×2): 2 mg via INTRAVENOUS
  Filled 2014-07-12 (×2): qty 1

## 2014-07-12 MED ORDER — FUROSEMIDE 10 MG/ML IJ SOLN
40.0000 mg | Freq: Three times a day (TID) | INTRAMUSCULAR | Status: DC
  Administered 2014-07-12 (×2): 40 mg via INTRAVENOUS
  Filled 2014-07-12: qty 4

## 2014-07-12 NOTE — Progress Notes (Signed)
eLink Physician-Brief Progress Note Patient Name: Timothy Trujillo DOB: November 01, 1950 MRN: [id number]   Date of Service  07/12/2014  HPI/Events of Note  Patient communicating to nurse that he is anxious.  On xanax BID  eICU Interventions  Plan: One time dose of ativan 1 mg IV     Intervention Category Minor Interventions: Agitation / anxiety - evaluation and management  DETERDING,ELIZABETH 07/12/2014, 3:39 AM

## 2014-07-12 NOTE — Progress Notes (Signed)
100ml fentanyl wasted in sink. Witnessed by Obie DredgeBrittany Davis, RN

## 2014-07-12 NOTE — Progress Notes (Signed)
Met with pt's family and pastor.  Pt is clear in his thoughts.  He was previously DNR.  He does not want to be maintained on breathing machine anymore.  He realizes that he will die if breathing tube removed >> he is at peace with this decision.  Will change to DNR status.  Will await remainder of family to arrive, and then proceed with comfort measures.  Chesley Mires, MD [facility] Pulmonary/Critical Care 07/12/2014, 1:23 PM Pager:  [phone] After 3pm call: [phone]

## 2014-07-12 NOTE — Progress Notes (Signed)
Inpatient Diabetes Program Recommendations  AACE/ADA: New Consensus Statement on Inpatient Glycemic Control (2013)  Target Ranges:  Prepandial:   less than 140 mg/dL      Peak postprandial:   less than 180 mg/dL (1-2 hours)      Critically ill patients:  140 - 180 mg/dL   Inpatient Diabetes Program Recommendations Insulin - Basal: . Correction (SSI): increase Novolog to moderate scale during steroid therapy Thank you  Timothy ClimesGina Zaiyah Trujillo BSN, RN,CDE Inpatient Diabetes Coordinator [phone] (team pager)

## 2014-07-12 NOTE — Progress Notes (Signed)
Advanced Heart Failure Rounding Note   Subjective:    Admitted with cardiogenic shock with CO-OX 33%. Started on milrinone with levophed added.  Developed respiratory distress and he was intubated on 4/3. Having VT yesterday with amiodarone drip started. No further VT noted.   Remains off norepinephrine. BP ok over night. Remains on milrinone at 0.125 mcg.  Yesterday he received 40 mg IV lasix twice daily. Sluggish urine output.  Anxious over night requiring xanax.  Remains intubated.   CO-OX 60% Creatinine 1.2 Pro calcitonin 2.87 Hgb 9.1>8.7 >8.0  Objective:   Weight Range:  Vital Signs:   Temp:  [97.4 F (36.3 C)-98.3 F (36.8 C)] 98 F (36.7 C) (04/05 0400) Pulse Rate:  [75-109] 76 (04/05 0600) Resp:  [3-34] 25 (04/05 0600) BP: (77-116)/(43-66) 88/50 mmHg (04/05 0600) SpO2:  [88 %-100 %] 100 % (04/05 0600) Arterial Line BP: (83-118)/(38-54) 110/47 mmHg (04/04 1600) FiO2 (%):  [40 %-50 %] 40 % (04/05 0400) Weight:  [241 lb 6.5 oz (109.5 kg)] 241 lb 6.5 oz (109.5 kg) (04/05 0423) Last BM Date: 07/04/14  Weight change: Filed Weights   07/10/14 0359 07/11/14 0500 07/12/14 0423  Weight: 237 lb 14 oz (107.9 kg) 240 lb 1.3 oz (108.9 kg) 241 lb 6.5 oz (109.5 kg)    Intake/Output:   Intake/Output Summary (Last 24 hours) at 07/12/14 0719 Last data filed at 07/12/14 0600  Gross per 24 hour  Intake 1943.53 ml  Output   2235 ml  Net -291.47 ml     Physical Exam: CVP 11 General: Intubated. No resp difficulty HEENT: normal Neck: supple. JVP 9-10 . Carotids 2+ bilat; no bruits. No lymphadenopathy or thryomegaly appreciated. Cor: PMI nondisplaced. Regular rate & rhythm. No rubs, gallops or murmurs. Lungs: clear Abdomen: soft, nontender, nondistended. No hepatosplenomegaly. No bruits or masses. Good bowel sounds. Extremities: no cyanosis, clubbing, rash, edema R and LLE no edema  Neuro: Intubated   Telemetry: NSR 90s   Labs: Basic Metabolic Panel:  Recent Labs Lab  07/08/14 0600 07/08/14 2000 07/09/14 0340 07/10/14 0350 07/11/14 0500 07/11/14 1000 07/12/14 0540  NA 138 134* 134* 136 137  --  130*  K 3.8 4.0 3.9 3.7 3.3*  --  4.2  CL 99 94* 94* 94* 97  --  95*  CO2 32 29 35* 29 32  --  27  GLUCOSE 197* 389* 291* 149* 52*  --  312*  BUN 26* 36*  --  46*  CREATININE 0.78 0.95 0.91 0.96 1.18  --  1.24  CALCIUM 8.8 8.4 8.4 8.9 8.6  --  8.5  MG 1.3*  --  2.0 1.7 1.7 1.6  --   PHOS 3.8  --   --  2.5 3.8  --   --     Liver Function Tests:  Recent Labs Lab 07/06/14 0436  AST 13  ALT 9  ALKPHOS 99  BILITOT 0.6  PROT 6.8  ALBUMIN 3.2*   No results for input(s): LIPASE, AMYLASE in the last 168 hours. No results for input(s): AMMONIA in the last 168 hours.  CBC:  Recent Labs Lab August 02, 2014 1644  07/07/14 0937 07/08/14 0600 07/09/14 0340 07/11/14 0500 07/12/14 0540  WBC 12.5*  < > 15.7* 16.4* 12.1* 11.9* 9.6  NEUTROABS 10.3*  --   --   --   --   --   --   HGB 10.3*  < > 10.2* 10.0* 9.1* 8.7* 8.0*  HCT 31.8*  < > 31.4* 30.5*  27.7* 26.7* 25.1*  MCV 89.1  < > 89.7 88.4 87.4 88.1 88.7  PLT 269  < > 282 303 264 289 287  < > = values in this interval not displayed.  Cardiac Enzymes:  Recent Labs Lab 07/06/14 0215 07/06/14 0436 07/06/14 1326 07/06/14 2009 07/07/14 0045  TROPONINI 0.15* 0.17* 0.15* 0.13* 0.10*    BNP: BNP (last 3 results)  Recent Labs  05/06/14 2355 06/04/14 0325 2015/03/25 1644  BNP 646.0* 531.0* 934.0*    ProBNP (last 3 results)  Recent Labs  07/30/13 1523 02/25/14 2200  PROBNP 604.2* 1014.0*      Other results:  Imaging: Dg Abd Portable 1v  07/10/2014   CLINICAL DATA:  Nasojejunal tube placement  EXAM: PORTABLE ABDOMEN - 1 VIEW  COMPARISON:  07/10/2014  FINDINGS: Normal bowel gas pattern. Left basilar consolidation partly visualized. Median sternotomy wires are again noted with discontinuity of the most inferior wire again noted. Cholecystectomy clips are noted. Nasogastric tube tip  terminates over the expected location of the distal stomach.  IMPRESSION: Feeding tube tip over the expected location of the distal stomach.   Electronically Signed   By: Christiana PellantGretchen  Green M.D.   On: 07/10/2014 10:49     Medications:     Scheduled Medications: . antiseptic oral rinse  7 mL Mouth Rinse QID  . aspirin  325 mg Per Tube Daily  . atorvastatin  80 mg Per Tube q1800  . chlorhexidine  15 mL Mouth Rinse BID  . clopidogrel  75 mg Per Tube Q breakfast  . digoxin  0.125 mg Per Tube Daily  . feeding supplement (PRO-STAT SUGAR FREE 64)  30 mL Per Tube TID  . furosemide  40 mg Intravenous BID  . heparin subcutaneous  5,000 Units Subcutaneous 3 times per day  . insulin aspart  0-9 Units Subcutaneous 6 times per day  . insulin detemir  20 Units Subcutaneous BID  . methylPREDNISolone (SOLU-MEDROL) injection  60 mg Intravenous Q6H  . pantoprazole sodium  40 mg Per Tube Daily  . polyethylene glycol  17 g Oral Daily  . sodium chloride  10-40 mL Intracatheter Q12H  . sodium chloride  3 mL Intravenous Q12H  . vitamin B-12  1,000 mcg Oral Daily    Infusions: . amiodarone 30 mg/hr (07/12/14 0536)  . feeding supplement (VITAL 1.5 CAL) 1,000 mL (07/11/14 2000)  . fentaNYL infusion INTRAVENOUS 275 mcg/hr (07/12/14 0345)  . milrinone 0.125 mcg/kg/min (07/12/14 0536)  . norepinephrine (LEVOPHED) Adult infusion Stopped (07/11/14 0230)    PRN Medications: Place/Maintain arterial line **AND** sodium chloride, acetaminophen (TYLENOL) oral liquid 160 mg/5 mL, ALPRAZolam, ipratropium, levalbuterol, nitroGLYCERIN, ondansetron, sodium chloride   Assessment:  1.  Cardiogenic Shock - CO-OX 33% 2.  ILD: severe, O2 dependent. S/P VATs with biopsy 2012.  3.  Chest pain 4.  Diabetes mellitus type 2, uncontrolled 5.  Acute on chronic systolic heart failure 6.  Femoral neck fracture 7.  Depression with anxiety 8.  Acute respiratory failure with hypoxia 9.  Acute on chronic systolic  congestive heart failure- ECHO 05/08/2014 EF 25-30%.  Ischemic CMP. 10.  Dyspnea   11.       Hypoglycemia.    12.       CAD s/p CABG   13.       Anemia  Plan/Discussion:   He remains a full code.   Respiratory decompensation 4/3 and intubated.  He has baseline ILD.  Afebrile.  Procalcitonin on 4/4 was up to 2.87.  Initial decompensation thought to be from pulmonary edema. Sluggish urine output. CVP elevated but not markedly so at 11. Increase lasix to 40 mg IV three times a day.   No further VT overnight. NSR on amiodarone 30 mg/hr. Long term would not use amiodarone with ILD would favor Toprol XL.  Repeat Magnesium level. K 4.2   Off norepinephrine and on low dose milrinone 0.125 mcg/kg/min with adequate CO-OX (60%) this am.  Continue milrinone at current dose for now.    Hgb trending down 8.7 > 8.0 Check anemia panel. Guaiac stool.   WBC coming down a little. 16>12.1>11.9>9.6 . Currently not on antibiotics. Procalcitonin 2.87. Antibiotics per PCCM.    Length of Stay: 7  CLEGG,AMY NP-C  07/12/2014, 7:19 AM  Advanced Heart Failure Team Pager [phone] (M-F; 7a - 4p)  Please contact [facility] for night-coverage after hours (4p -7a ) and weekends on [email or web address]  Patient seen with NP, agree with the above note.  Remains intubated.  CVP 11 so not markedly elevated.  Agree with increasing Lasix to 40 mg IV every 8 hours today.  Procalcitonin high, concern HCAP as contributing to respiratory failure.  Defer abx to CCM.  Also has underlying ILD, now on Solumedrol IV.   Marca Ancona 07/12/2014 7:44 AM

## 2014-07-12 NOTE — Progress Notes (Signed)
Pt writing on board that he does not want breathing tube.  Writes he is tired and doesn't want to fight anymore.  Dr. Craige CottaSood notified and will come and speak to pt.  Will continue to monitor.

## 2014-07-12 NOTE — Progress Notes (Signed)
Pt's son notified RN that family was ready to proceed with terminal wean.  [facility]  Will continue to monitor.

## 2014-07-12 NOTE — Progress Notes (Signed)
PT Cancellation Note  Patient Details Name: Timothy Trujillo MRN: [id number] DOB: 1950-08-22   Cancelled Treatment:    Reason Eval/Treat Not Completed: Medical issues which prohibited therapy (Pt with intubation the day after evaluation. No longer medically appropriate and potentially leaning toward end of life care. Will sign off and request new order should therapy be appropriate.)   Delorse Lekabor, Delfino Friesen Beth 07/12/2014, 12:13 PM Delaney MeigsMaija Tabor Mattea Seger, PT [phone]

## 2014-07-12 NOTE — Progress Notes (Signed)
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  Was d/c and returned 3/29 with c/o SOB and chest tightness, lightheadedness.  Admitted by [facility] and treated for acute on chronic sCHF with diuresis.  On 3/30 pt developed worsening SOB, hypotension and PCCM consulted.   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    VITAL SIGNS: Temp:  [97.4 F (36.3 C)-98.3 F (36.8 C)] 98 F (36.7 C) (04/05 0400) Pulse Rate:  [75-109] 76 (04/05 0600) Resp:  [3-34] 25 (04/05 0600) BP: (77-116)/(43-66) 88/50 mmHg (04/05 0600) SpO2:  [88 %-100 %] 100 % (04/05 0600) Arterial Line BP: (83-114)/(38-54) 110/47 mmHg (04/04 1600) FiO2 (%):  [40 %-50 %] 40 % (04/05 0400) Weight:  [241 lb 6.5 oz (109.5 kg)] 241 lb 6.5 oz (109.5 kg) (04/05 0423) HEMODYNAMICS: CVP:  [6 mmHg-15 mmHg] 12 mmHg VENTILATOR SETTINGS: Vent Mode:  [-] PRVC FiO2 (%):  [40 %-50 %] 40 % Set Rate:  [25 bmp] 25 bmp Vt Set:  [600 mL] 600 mL PEEP:  [5 cmH20-10 cmH20] 5 cmH20 Plateau Pressure:  [15 cmH20-29 cmH20] 29 cmH20 INTAKE / OUTPUT:  Intake/Output Summary (Last 24 hours) at 07/12/14 0801 Last data filed at 07/12/14 0600  Gross per 24 hour  Intake 1852.73 ml  Output   2235 ml  Net -382.27 ml    PHYSICAL EXAMINATION: General: no distress Neuro: RASS -1 HEENT:  ETT in place Cardiovascular: regular Lungs: b/l  crackles Abdomen:  Soft, non tender Musculoskeletal:  No edema  LABS:  CBC  Recent Labs Lab 07/09/14 0340 07/11/14 0500 07/12/14 0540  WBC 12.1* 11.9* 9.6  HGB 9.1* 8.7* 8.0*  HCT 27.7* 26.7* 25.1*  PLT 264 289 287   Coag's  Recent Labs Lab 06/21/2014 1644  INR 1.06   BMET  Recent Labs Lab 07/10/14 0350 07/11/14 0500 07/12/14 0540  NA 136 137 130*  K 3.7 3.3* 4.2  CL 94* 97 95*  CO2 29 32 27  BUN 26* 36* 46*  CREATININE 0.96 1.18 1.24  GLUCOSE 149* 52* 312*   Electrolytes  Recent Labs Lab 07/08/14 0600  07/10/14 0350 07/11/14 0500 07/11/14 1000 07/12/14 0540 07/12/14 0608  CALCIUM 8.8  < > 8.9 8.6  --  8.5  --   MG 1.3*  < > 1.7 1.7 1.6  --  2.2  PHOS 3.8  --  2.5 3.8  --   --   --   < > = values in this interval not displayed.   Sepsis Markers  Recent Labs Lab 07/06/14 1328  07/08/14 0600 07/11/14 1000 07/12/14 0540  LATICACIDVEN 1.8  --   --   --   --   PROCALCITON  --   < > <0.10 2.87 1.67  < > = values in this interval not displayed.   ABG  Recent Labs Lab 07/06/14 1714 07/10/14 0708 07/11/14 0928  PHART 7.468* 7.426 7.397  PCO2ART 39.8 39.9 49.1*  PO2ART 81.0 108.0* 105.0*   Liver Enzymes  Recent Labs Lab 07/06/14 0436  AST 13  ALT 9  ALKPHOS 99  BILITOT 0.6  ALBUMIN 3.2*   Cardiac Enzymes  Recent Labs Lab 07/06/14 1326 07/06/14 2009 07/07/14 0045  TROPONINI 0.15* 0.13* 0.10*   Glucose  Recent Labs Lab 07/11/14 0949 07/11/14 1055 07/11/14 1601 07/11/14 2011 07/12/14 0014 07/12/14 0357  GLUCAP 121* 111* 115* 217* 290* 325*    Imaging Dg Chest Port 1 View  07/12/2014   CLINICAL DATA:  Respiratory failure.  EXAM: PORTABLE CHEST - 1 VIEW  COMPARISON:  07/10/2014.  FINDINGS: Endotracheal tube, right PICC line in stable position. Interim placement of NG tube, its tip is below the left hemidiaphragm. Cardiomegaly. Prior CABG. Diffuse pulmonary interstitial prominence and small bilateral pleural effusions.  These findings suggest congestive heart failure. Bilateral pneumonia cannot be excluded.  IMPRESSION: 1. Interim placement of NG tube, its tip is below the left hemidiaphragm. Endotracheal tube and right PICC line in stable position. 2. Prior CABG. Cardiomegaly with bilateral pulmonary interstitial prominence and bilateral pleural effusions again noted. These findings are consistent with congestive heart failure. Bilateral pneumonia cannot be excluded. No interim change.   Electronically Signed   By: Marcello Moores  Register   On: 07/12/2014 07:21   Dg Abd Portable 1v  07/10/2014   CLINICAL DATA:  Nasojejunal tube placement  EXAM: PORTABLE ABDOMEN - 1 VIEW  COMPARISON:  07/10/2014  FINDINGS: Normal bowel gas pattern. Left basilar consolidation partly visualized. Median sternotomy wires are again noted with discontinuity of the most inferior wire again noted. Cholecystectomy clips are noted. Nasogastric tube tip terminates over the expected location of the distal stomach.  IMPRESSION: Feeding tube tip over the expected location of the distal stomach.   Electronically Signed   By: Conchita Paris M.D.   On: 07/10/2014 10:49      ASSESSMENT / PLAN:  PULMONARY ETT 4/03 >> A: Acute on chronic respiratory failure 2nd to pulmonary edema and ILD >> ESR elevated from 4/01. P:   Full vent support Adjust PEEP/FiO2 to keep SpO2 88 to 95% F/u CXR PRN BD's  CARDIOVASCULAR A: Acute on chronic systolic CHF >> baseline EF 20 to 25%. Hx of CAD s/p CABG. Cardiogenic shock. P:  Continue ASA, lipitor, plavix, digoxin Milrinone per cardiology Diuresis per cardiology  RENAL A: Hypokalemia. P:   Monitor renal fx Replace electrolytes as needed  GASTROINTESTINAL A: Nutrition. P:   Tube feeds while on vent Protonix for SUP  HEMATOLOGIC A: Leukocytosis. Anemia of critical illness and chronic disease. P:  F/u CBC SQ heparin for DVT prophylaxis F/u anemia panel  INFECTIOUS A: Concern for HCAP >> less  likely given low procalcitonin and trending down, no fever, and WBC trending down. Coag neg staph in blood cx from 3/30 ?real vs contaminate. P:   Monitor off Abx  Blood 3/30 >> coag neg staph Blood 4/01 >>  ENDOCRINE A: DM with steroid induced hyperglycemia. P:   SSI Continue levemir  NEUROLOGIC A: Acute encephalopathy. Deconditioning. P:   RASS goal -1  ORTHO  A: L Hip fx  P:  PT/OT when stable   Goals of care >> he was previously DNR with palliative care.  Family requested to change to full code.  Will re-address depending on ability to wean vent over next few days.  CC time 35 minutes.   Chesley Mires, MD [facility] Pulmonary/Critical Care 07/12/2014, 8:01 AM Pager:  [phone] After 3pm  call: [phone]

## 2014-07-12 NOTE — Progress Notes (Signed)
Terminally extubated patient per MD order.

## 2014-07-13 ENCOUNTER — Encounter: Payer: Self-pay | Admitting: *Deleted

## 2014-07-13 LAB — CULTURE, BLOOD (ROUTINE X 2): Culture: NO GROWTH

## 2014-07-13 MED ORDER — CETYLPYRIDINIUM CHLORIDE 0.05 % MT LIQD: 7.0000 mL | Freq: Two times a day (BID) | OROMUCOSAL | Status: DC

## 2014-07-13 NOTE — Progress Notes (Signed)
   07/13/14 1100  Clinical Encounter Type  Visited With Patient and family together  Visit Type Initial;Spiritual support;Social support;Patient actively dying  Referral From Physician  Spiritual Encounters  Spiritual Needs Grief support  Stress Factors  Patient Stress Factors Exhausted  Family Stress Factors Loss   Chaplain was referred to patient via spiritual care consult. When chaplain visited, several family members were at bedside, including the patient's aunt, cousin, two sons, and two daughter-in-laws. One of the patient's sons and his wife have been at bedside throughout the night. Yesterday was the birthday of the patient's son and the patient's son said the patient tried really hard to be there for him. Patient and patient's family are religious and their faith is important to them. Specifically, the patient's family is methodist and they have had a minister supporting them. Yesterday, the patient's son said they had a group of family and friends come sing 'Amazing Delorise ShinerGrace" for the patient because he likes music and really wanted to hear some singing. Family continues to be at bedside and supporting each other. Patient's family said they may transfer to another room in the hospital so they can have more privacy during these last moments of the patient's life. Chaplain let the patient's family know he is available for support throughout the day. Contact chaplain if patient requests further support.  Dara HoyerStrother, Daneen Volcy R, Chaplain  Pager: [phone] 11:44 AM

## 2014-07-13 NOTE — Progress Notes (Signed)
PULMONARY / CRITICAL CARE MEDICINE  Timothy Trujillo is a 64 y.o. male admitted on 2015-04-05 with dypsnea, chest pain, and dizziness.  He had recent hip fx s/p repair.  He has hx of systolic CHF, ILD on home oxygen.  He was seen by cardiology.  He was started on milrinone and diuresis.  He was treated for HCAP, and started on steroids for possible pneumonitis.  He failed to improve, and required intubation.  He later informed his family that he would not want to continue life support, and that his quality of life has been miserable.  He opted to transition to comfort care.  He was extubated on 4/05.  He was transferred to floor bed on 4/06.  SUBJECTIVE:  Appears comfortable  VITAL SIGNS: Temp:  [97.5 F (36.4 C)-98 F (36.7 C)] 98 F (36.7 C) (04/05 1200) Pulse Rate:  [74-117] 100 (04/06 0600) Resp:  [9-27] 19 (04/06 0600) BP: (79-135)/(35-109) 131/108 mmHg (04/05 2005) SpO2:  [62 %-100 %] 73 % (04/06 0600) FiO2 (%):  [40 %] 40 % (04/05 1552)  PHYSICAL EXAMINATION: General: no distress Neuro: sleepy, wakes up and follows simple commands HEENT: no sinus tenderness Cardiovascular: regular Lungs: b/l crackles Abdomen:  Soft, non tender Musculoskeletal:  No edema  LABS:  CBC  Recent Labs Lab 07/09/14 0340 07/11/14 0500 07/12/14 0540  WBC 12.1* 11.9* 9.6  HGB 9.1* 8.7* 8.0*  HCT 27.7* 26.7* 25.1*  PLT 264 289 287   BMET  Recent Labs Lab 07/10/14 0350 07/11/14 0500 07/12/14 0540  NA 136 137 130*  K 3.7 3.3* 4.2  CL 94* 97 95*  CO2 29 32 27  BUN 26* 36* 46*  CREATININE 0.96 1.18 1.24  GLUCOSE 149* 52* 312*   Imaging Dg Chest Port 1 View  07/12/2014   CLINICAL DATA:  Respiratory failure.  EXAM: PORTABLE CHEST - 1 VIEW  COMPARISON:  07/10/2014.  FINDINGS: Endotracheal tube, right PICC line in stable position. Interim placement of NG tube, its tip is below the left hemidiaphragm. Cardiomegaly. Prior CABG. Diffuse pulmonary interstitial prominence and small bilateral  pleural effusions. These findings suggest congestive heart failure. Bilateral pneumonia cannot be excluded.  IMPRESSION: 1. Interim placement of NG tube, its tip is below the left hemidiaphragm. Endotracheal tube and right PICC line in stable position. 2. Prior CABG. Cardiomegaly with bilateral pulmonary interstitial prominence and bilateral pleural effusions again noted. These findings are consistent with congestive heart failure. Bilateral pneumonia cannot be excluded. No interim change.   Electronically Signed   By: Maisie Fushomas  Register   On: 07/12/2014 07:21    ASSESSMENT:  Acute on chronic hypoxic respiratory failure. Interstitial lung disease with acute pneumonitis. Acute on chronic systolic heart failure. Acute pulmonary edema. HCAP. Hx of CAD s/p CABG. Cardiogenic shock. Septic Shock. Hypokalemia. Anemia of critical illness and chronic disease. Diabetes mellitus with renal complications. Acute metabolic encephalopathy. Deconditioning. Recent Lt hip fracture.  PLAN:  DNR/DNI Continue morphine, ativan as needed Transfer to medical floor bed  Updated pt's family at bedside.  Confirmed plan for comfort care.  Timothy HellingVineet Defne Gerling, MD [facility] Pulmonary/Critical Care 07/13/2014, 7:17 AM Pager:  [phone] After 3pm call: [phone]

## 2014-07-14 LAB — CULTURE, BLOOD (ROUTINE X 2)
CULTURE: NO GROWTH
CULTURE: NO GROWTH

## 2014-07-19 ENCOUNTER — Encounter: Payer: Self-pay | Admitting: Adult Health

## 2014-08-07 NOTE — Discharge Summary (Signed)
  [name] is a 64 y.o. male admitted on 07/03/2014 with dypsnea, chest pain, and dizziness.  He had recent hip fx s/p repair.  He has hx of systolic CHF, ILD on home oxygen.  He was seen by cardiology.  He was started on milrinone and diuresis.  He was treated for HCAP, and started on steroids for possible pneumonitis.  He failed to improve, and required intubation.  He later informed his family that he would not want to continue life support, and that his quality of life has been miserable.  He opted to transition to comfort care.  He was extubated on 4/05.  He was transferred to floor bed on 4/06.  He subsequently expired on 4/07.  Final Diagnoses:  Acute on chronic hypoxic respiratory failure. Interstitial lung disease with acute pneumonitis. Acute on chronic systolic heart failure. Acute pulmonary edema. HCAP. Hx of CAD s/p CABG. Cardiogenic shock. Septic Shock. Hypokalemia. Anemia of critical illness and chronic disease. Diabetes mellitus with renal complications. Acute metabolic encephalopathy. Deconditioning. Recent Lt hip fracture. Hx of tobacco abuse.  Coralyn HellingVineet Jarell Mcewen, MD [facility] Pulmonary/Critical Care 07/18/2014, 8:21 AM

## 2014-08-07 NOTE — Progress Notes (Signed)
Upon morning assessment, RN noted patient had stopped breathing and had no pulse. This was confirmed with second RN, Maryfrances BunnellLauren Reynolds.  Time of death [id number]. Family at bedside. Dr. Craige CottaSood was notified, gave orders that RN could pronounce death, and will sign death certificate.  70mL of Morphine wasted in sink with Evonnie Dawesakita Butler, RN. Medical Examiner notified of death and has accepted patient.  Patient transported to morgue and will [facility].

## 2014-08-07 DEATH — deceased

## 2016-01-04 NOTE — Progress Notes (Signed)
This encounter was created in error - please disregard.

## 2016-06-12 IMAGING — CT CT HIP*L* W/O CM
2 of 3 series · 16 of 46 positions shown, 18 images · non-contrast
Comparison: Plain films left hip 06/23/2014.

CLINICAL DATA: Status post fall 06/20/2014. The patient presented
to [REDACTED]/1876 with pain and inability to bear
weight fracture by plain films. Initial encounter.

EXAM:
CT OF THE LEFT HIP WITHOUT CONTRAST
TECHNIQUE: Multidetector CT imaging of the left hip was performed according to
the standard protocol. Multiplanar CT image reconstructions were
also generated.

[Series 3: pelvis 5.0 i31f 1 · axial · 0.34mm/px · z∈[+847,+1077]mm · 13 of 54 slices shown, 15 images]
[im 4/54  soft-tissue]
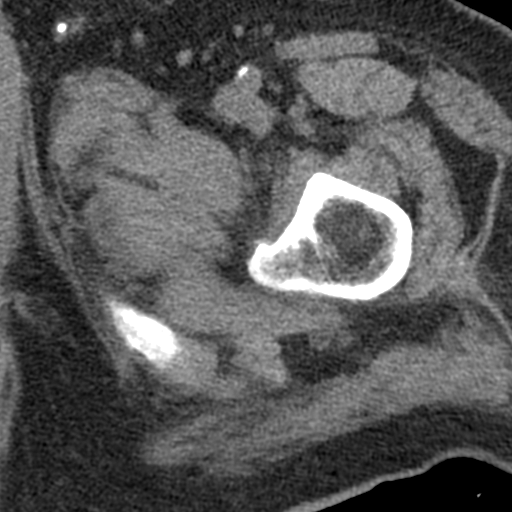
[im 4/54  bone]
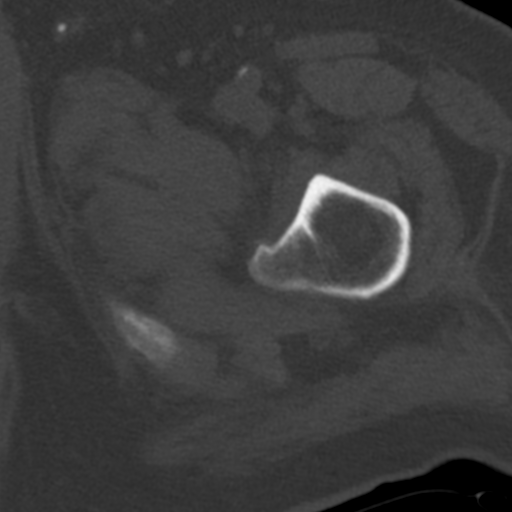
[im 7/54  soft-tissue]
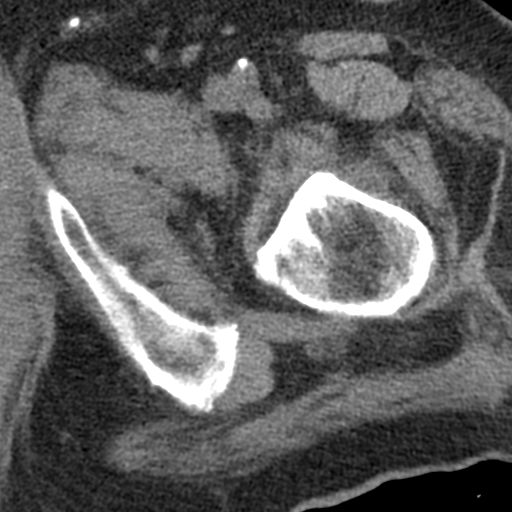
[im 11/54  soft-tissue]
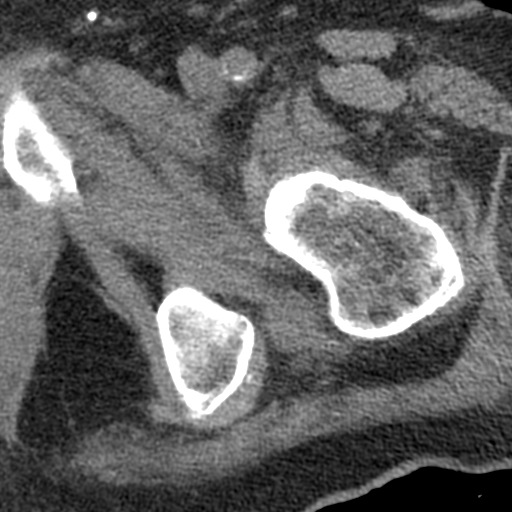
[im 16/54  soft-tissue]
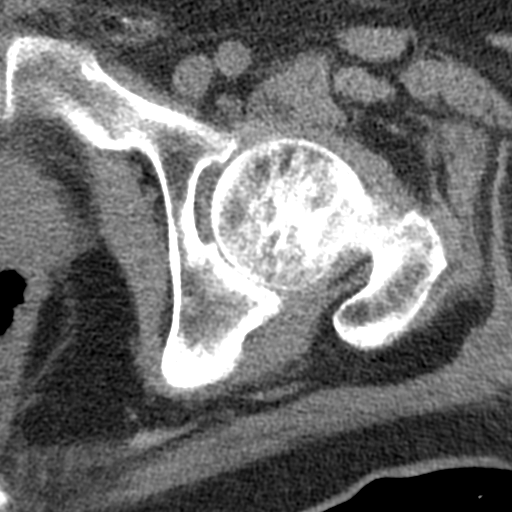
[im 19/54  soft-tissue]
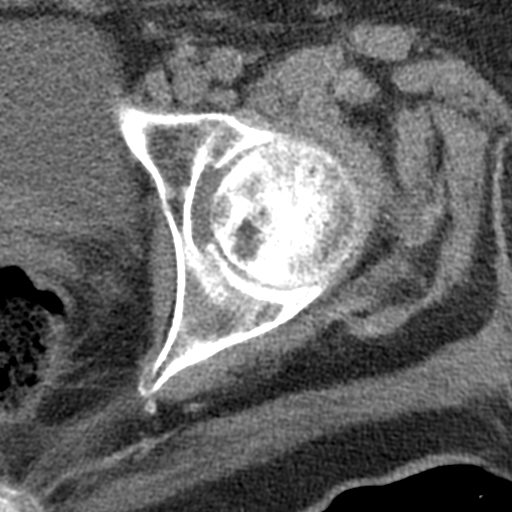
[im 23/54  soft-tissue]
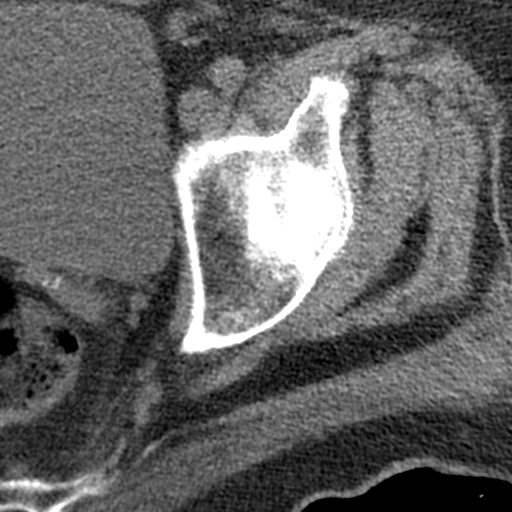
[im 28/54  soft-tissue]
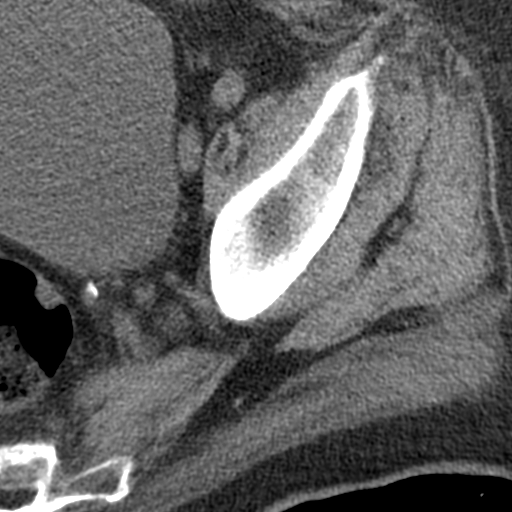
[im 31/54  soft-tissue]
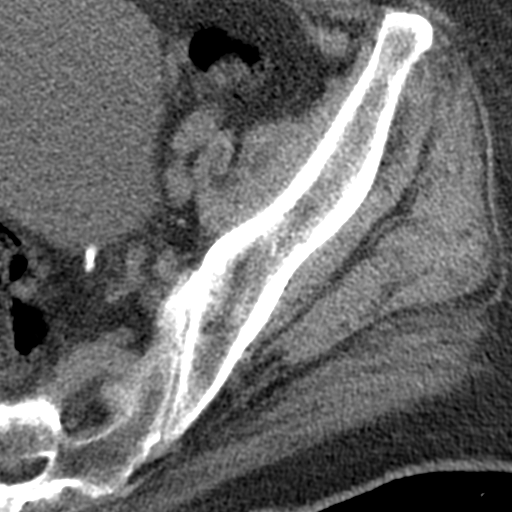
[im 35/54  soft-tissue]
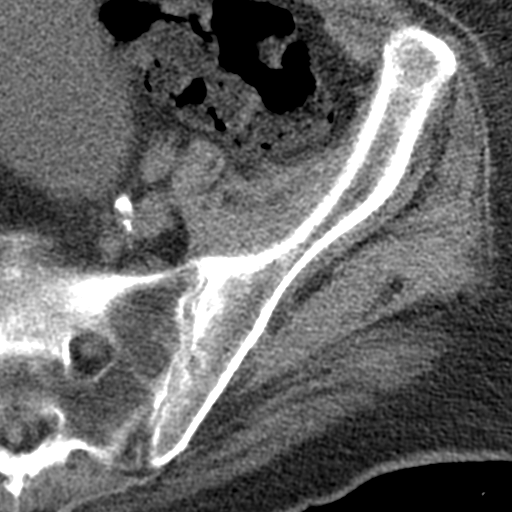
[im 35/54  bone]
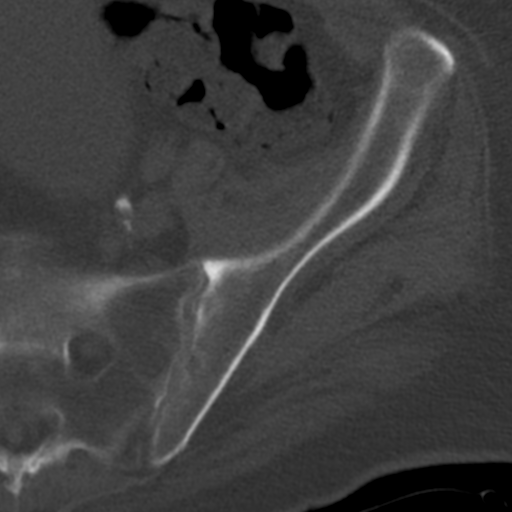
[im 38/54  soft-tissue]
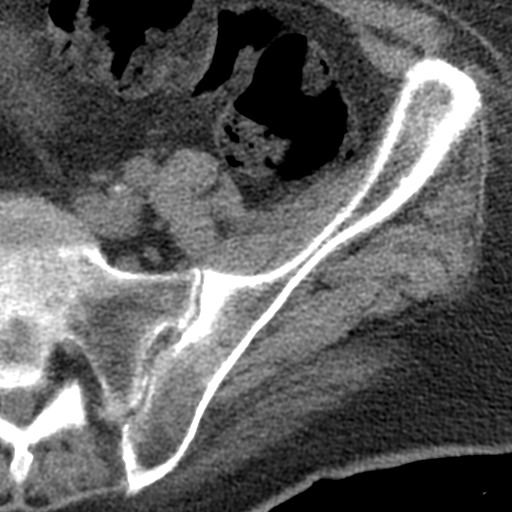
[im 43/54  soft-tissue]
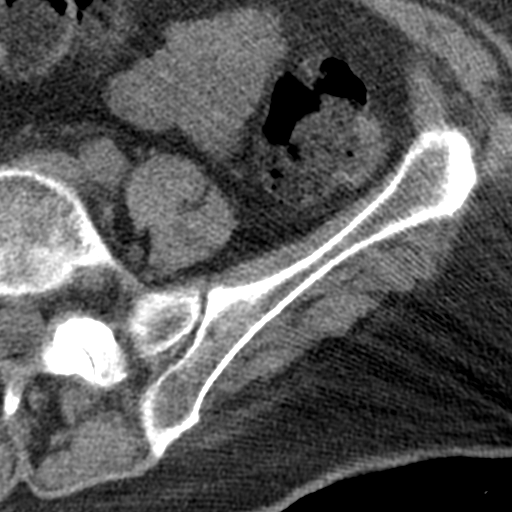
[im 47/54  soft-tissue]
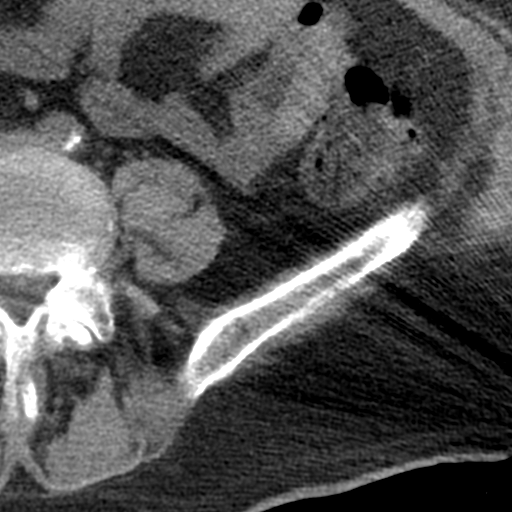
[im 50/54  soft-tissue]
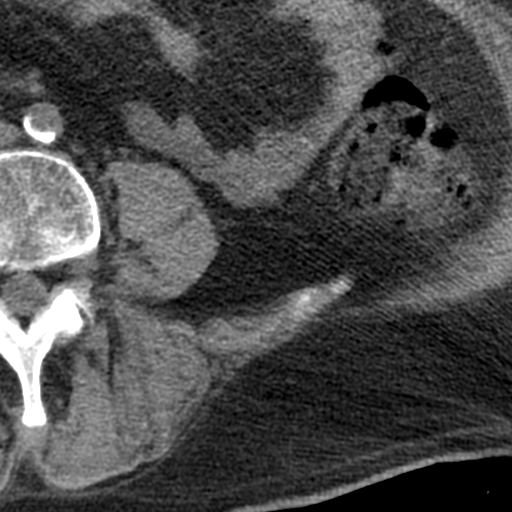

[Series 8: coronal · coronal · 0.36mm/px · 3 of 59 slices shown]
[im 20/59  soft-tissue]
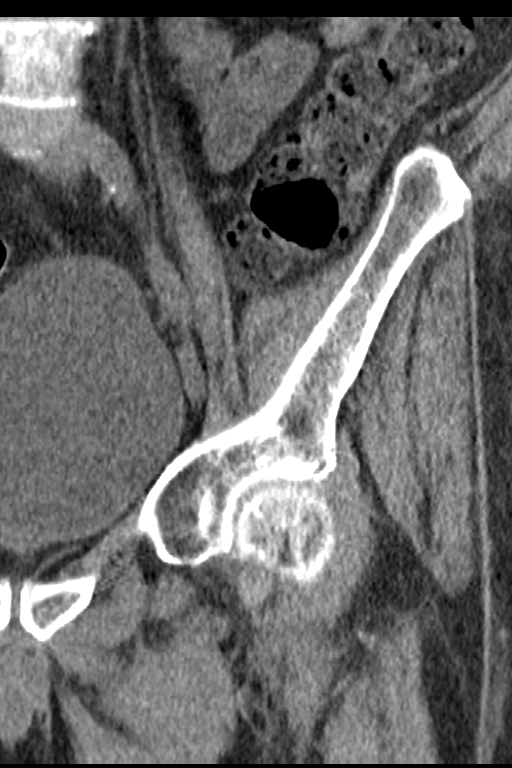
[im 26/59  soft-tissue]
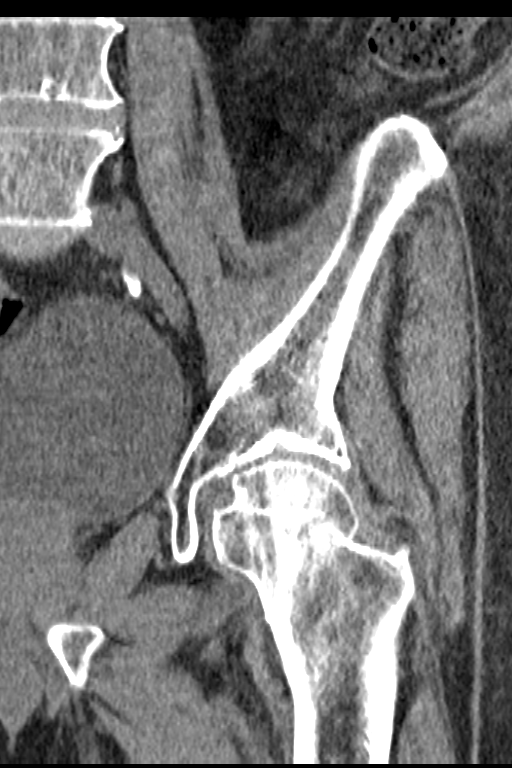
[im 33/59  soft-tissue]
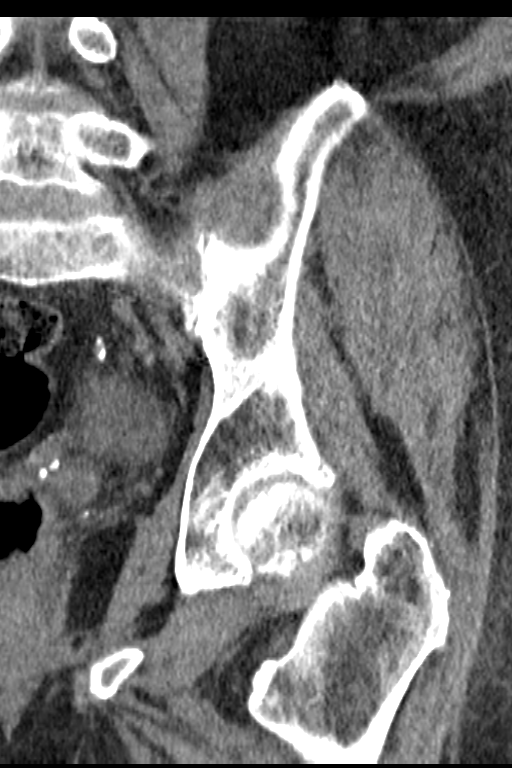

[16 of 46 positions shown; findings below may reference images not displayed]

FINDINGS: As seen on the patient's plain films, there is a minimally impacted
fracture of the left femoral neck. The fracture extends from a
subcapital position superiorly in an inferior and oblique
orientation through the mid aspect of the femoral neck. No other
fracture is identified. No focal bony lesion is seen. No notable
degenerative change is present about the right hip. Visualized lower
lumbar spine is unremarkable. Imaged intrapelvic contents
demonstrate aortoiliac atherosclerosis but are otherwise
unremarkable.
IMPRESSION: Acute appearing, minimally impacted left femoral neck fracture.

## 2016-06-13 IMAGING — RF DG HIP (WITH PELVIS) OPERATIVE*L*
1 series · 2 of 2 positions shown · non-contrast
Comparison: Plain film 06/23/2014

CLINICAL DATA: Left hip cannulation.

EXAM:
OPERATIVE left HIP (WITH PELVIS IF PERFORMED) 2 VIEWS
TECHNIQUE: Fluoroscopic spot image(s) were submitted for interpretation
post-operatively.

[Series 1: run · 2 of 2 slices shown]
[im 1/2]
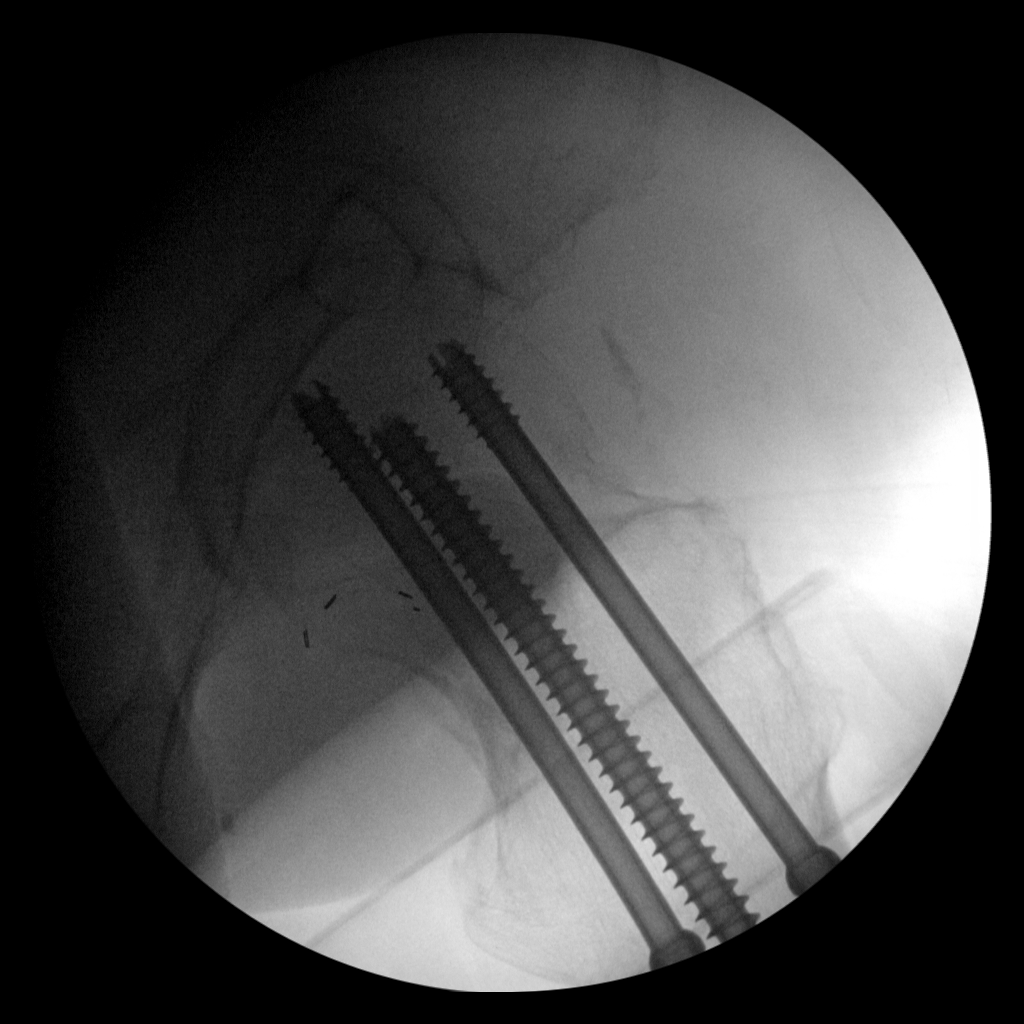
[im 2/2]
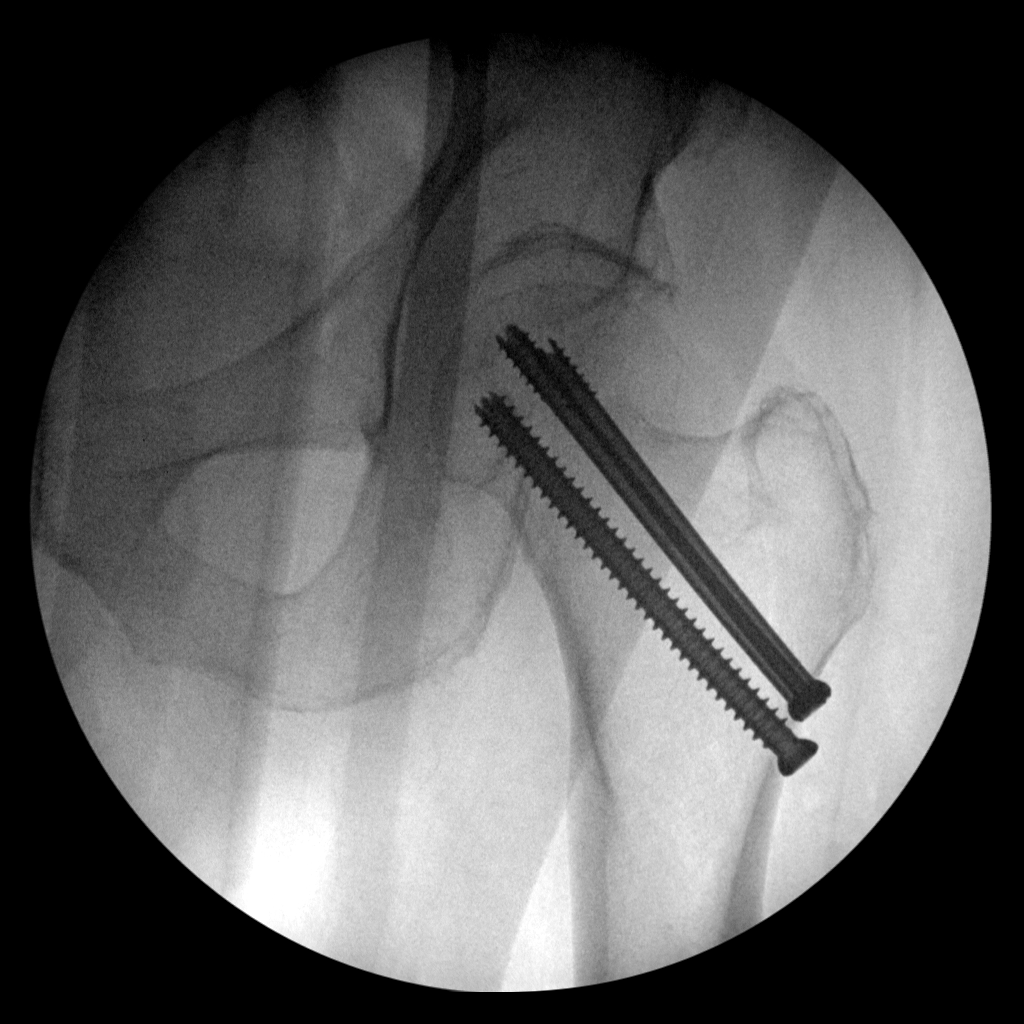

[2 of 2 positions shown; findings below may reference images not displayed]

FINDINGS: Three cannulated screws span the left femoral neck fracture. No
dislocation.
IMPRESSION: No complication following internal fixation of left femoral neck
fracture.
